# Patient Record
Sex: Female | Born: 1935 | Race: White | Hispanic: No | Smoking: Former smoker
Health system: Southern US, Community
[De-identification: ages and names within clinical notes are randomized; demographics above are authoritative.]

## PROBLEM LIST (undated history)

## (undated) DIAGNOSIS — M1990 Unspecified osteoarthritis, unspecified site: Secondary | ICD-10-CM

## (undated) DIAGNOSIS — I2510 Atherosclerotic heart disease of native coronary artery without angina pectoris: Secondary | ICD-10-CM

## (undated) DIAGNOSIS — G4700 Insomnia, unspecified: Secondary | ICD-10-CM

## (undated) DIAGNOSIS — R413 Other amnesia: Secondary | ICD-10-CM

## (undated) DIAGNOSIS — I10 Essential (primary) hypertension: Secondary | ICD-10-CM

## (undated) DIAGNOSIS — N183 Chronic kidney disease, stage 3 unspecified: Secondary | ICD-10-CM

## (undated) DIAGNOSIS — K2100 Gastro-esophageal reflux disease with esophagitis, without bleeding: Secondary | ICD-10-CM

## (undated) DIAGNOSIS — J329 Chronic sinusitis, unspecified: Secondary | ICD-10-CM

## (undated) DIAGNOSIS — F329 Major depressive disorder, single episode, unspecified: Secondary | ICD-10-CM

## (undated) DIAGNOSIS — F32A Depression, unspecified: Secondary | ICD-10-CM

## (undated) DIAGNOSIS — Z853 Personal history of malignant neoplasm of breast: Secondary | ICD-10-CM

## (undated) DIAGNOSIS — I350 Nonrheumatic aortic (valve) stenosis: Secondary | ICD-10-CM

## (undated) DIAGNOSIS — E039 Hypothyroidism, unspecified: Secondary | ICD-10-CM

## (undated) DIAGNOSIS — E785 Hyperlipidemia, unspecified: Secondary | ICD-10-CM

## (undated) HISTORY — DX: Gastro-esophageal reflux disease with esophagitis, without bleeding: K21.00

## (undated) HISTORY — PX: REPLACEMENT TOTAL KNEE BILATERAL: SUR1225

## (undated) HISTORY — DX: Unspecified osteoarthritis, unspecified site: M19.90

## (undated) HISTORY — DX: Insomnia, unspecified: G47.00

## (undated) HISTORY — DX: Atherosclerotic heart disease of native coronary artery without angina pectoris: I25.10

## (undated) HISTORY — DX: Chronic sinusitis, unspecified: J32.9

## (undated) HISTORY — DX: Hyperlipidemia, unspecified: E78.5

## (undated) HISTORY — PX: BREAST LUMPECTOMY: SHX2

## (undated) HISTORY — DX: Gastro-esophageal reflux disease with esophagitis: K21.0

## (undated) HISTORY — DX: Depression, unspecified: F32.A

## (undated) HISTORY — PX: GALLBLADDER SURGERY: SHX652

## (undated) HISTORY — DX: Chronic kidney disease, stage 3 (moderate): N18.3

## (undated) HISTORY — DX: Chronic kidney disease, stage 3 unspecified: N18.30

## (undated) HISTORY — PX: FOOT TENDON SURGERY: SHX958

## (undated) HISTORY — PX: TOTAL ABDOMINAL HYSTERECTOMY: SHX209

## (undated) HISTORY — DX: Hypothyroidism, unspecified: E03.9

## (undated) HISTORY — DX: Major depressive disorder, single episode, unspecified: F32.9

## (undated) HISTORY — PX: ANGIOPLASTY: SHX39

## (undated) HISTORY — DX: Other amnesia: R41.3

---

## 1997-08-13 ENCOUNTER — Ambulatory Visit (HOSPITAL_COMMUNITY): Admission: RE | Admit: 1997-08-13 | Discharge: 1997-08-13 | Payer: Self-pay

## 1999-02-03 ENCOUNTER — Ambulatory Visit (HOSPITAL_COMMUNITY): Admission: RE | Admit: 1999-02-03 | Discharge: 1999-02-03 | Payer: Self-pay | Admitting: Unknown Physician Specialty

## 1999-02-03 ENCOUNTER — Encounter: Payer: Self-pay | Admitting: Unknown Physician Specialty

## 1999-02-09 ENCOUNTER — Encounter: Payer: Self-pay | Admitting: Unknown Physician Specialty

## 1999-02-09 ENCOUNTER — Ambulatory Visit (HOSPITAL_COMMUNITY): Admission: RE | Admit: 1999-02-09 | Discharge: 1999-02-09 | Payer: Self-pay | Admitting: Unknown Physician Specialty

## 2000-05-16 ENCOUNTER — Ambulatory Visit (HOSPITAL_COMMUNITY): Admission: RE | Admit: 2000-05-16 | Discharge: 2000-05-16 | Payer: Self-pay | Admitting: Internal Medicine

## 2000-05-16 ENCOUNTER — Encounter: Payer: Self-pay | Admitting: Internal Medicine

## 2001-09-08 ENCOUNTER — Ambulatory Visit (HOSPITAL_COMMUNITY): Admission: RE | Admit: 2001-09-08 | Discharge: 2001-09-08 | Payer: Self-pay | Admitting: Internal Medicine

## 2001-09-08 ENCOUNTER — Encounter: Payer: Self-pay | Admitting: Internal Medicine

## 2002-05-09 ENCOUNTER — Other Ambulatory Visit: Admission: RE | Admit: 2002-05-09 | Discharge: 2002-05-09 | Payer: Self-pay | Admitting: Obstetrics and Gynecology

## 2004-12-03 ENCOUNTER — Inpatient Hospital Stay (HOSPITAL_COMMUNITY): Admission: RE | Admit: 2004-12-03 | Discharge: 2004-12-07 | Payer: Self-pay | Admitting: Orthopedic Surgery

## 2005-01-20 ENCOUNTER — Ambulatory Visit (HOSPITAL_COMMUNITY): Admission: RE | Admit: 2005-01-20 | Discharge: 2005-01-20 | Payer: Self-pay | Admitting: Internal Medicine

## 2005-05-06 ENCOUNTER — Inpatient Hospital Stay (HOSPITAL_COMMUNITY): Admission: RE | Admit: 2005-05-06 | Discharge: 2005-05-10 | Payer: Self-pay | Admitting: Orthopedic Surgery

## 2005-11-08 ENCOUNTER — Ambulatory Visit: Payer: Self-pay | Admitting: Internal Medicine

## 2005-11-30 ENCOUNTER — Ambulatory Visit: Payer: Self-pay | Admitting: Internal Medicine

## 2005-12-29 ENCOUNTER — Ambulatory Visit: Admission: RE | Admit: 2005-12-29 | Discharge: 2005-12-29 | Payer: Self-pay | Admitting: Internal Medicine

## 2005-12-30 ENCOUNTER — Ambulatory Visit: Payer: Self-pay | Admitting: Internal Medicine

## 2007-06-06 ENCOUNTER — Ambulatory Visit (HOSPITAL_COMMUNITY): Admission: RE | Admit: 2007-06-06 | Discharge: 2007-06-06 | Payer: Self-pay | Admitting: Internal Medicine

## 2007-06-15 ENCOUNTER — Encounter: Admission: RE | Admit: 2007-06-15 | Discharge: 2007-06-15 | Payer: Self-pay | Admitting: Internal Medicine

## 2007-06-20 ENCOUNTER — Encounter (INDEPENDENT_AMBULATORY_CARE_PROVIDER_SITE_OTHER): Payer: Self-pay | Admitting: Diagnostic Radiology

## 2007-06-20 ENCOUNTER — Encounter: Admission: RE | Admit: 2007-06-20 | Discharge: 2007-06-20 | Payer: Self-pay | Admitting: Internal Medicine

## 2007-06-23 DIAGNOSIS — D0510 Intraductal carcinoma in situ of unspecified breast: Secondary | ICD-10-CM

## 2007-06-23 HISTORY — DX: Intraductal carcinoma in situ of unspecified breast: D05.10

## 2007-06-29 ENCOUNTER — Encounter: Admission: RE | Admit: 2007-06-29 | Discharge: 2007-06-29 | Payer: Self-pay | Admitting: Internal Medicine

## 2007-07-28 ENCOUNTER — Encounter: Admission: RE | Admit: 2007-07-28 | Discharge: 2007-07-28 | Payer: Self-pay | Admitting: Surgery

## 2007-08-01 ENCOUNTER — Encounter: Admission: RE | Admit: 2007-08-01 | Discharge: 2007-08-01 | Payer: Self-pay | Admitting: Surgery

## 2007-08-01 ENCOUNTER — Ambulatory Visit (HOSPITAL_BASED_OUTPATIENT_CLINIC_OR_DEPARTMENT_OTHER): Admission: RE | Admit: 2007-08-01 | Discharge: 2007-08-01 | Payer: Self-pay | Admitting: Surgery

## 2007-08-01 ENCOUNTER — Encounter (INDEPENDENT_AMBULATORY_CARE_PROVIDER_SITE_OTHER): Payer: Self-pay | Admitting: Surgery

## 2008-03-24 DIAGNOSIS — C50911 Malignant neoplasm of unspecified site of right female breast: Secondary | ICD-10-CM

## 2008-03-24 HISTORY — DX: Malignant neoplasm of unspecified site of right female breast: C50.911

## 2008-05-30 ENCOUNTER — Ambulatory Visit (HOSPITAL_COMMUNITY): Admission: RE | Admit: 2008-05-30 | Discharge: 2008-05-30 | Payer: Self-pay | Admitting: Endocrinology

## 2008-07-01 ENCOUNTER — Encounter: Admission: RE | Admit: 2008-07-01 | Discharge: 2008-07-01 | Payer: Self-pay | Admitting: Surgery

## 2008-07-03 ENCOUNTER — Encounter: Admission: RE | Admit: 2008-07-03 | Discharge: 2008-07-03 | Payer: Self-pay | Admitting: Surgery

## 2008-07-03 ENCOUNTER — Encounter (INDEPENDENT_AMBULATORY_CARE_PROVIDER_SITE_OTHER): Payer: Self-pay | Admitting: Radiology

## 2008-07-10 ENCOUNTER — Encounter: Admission: RE | Admit: 2008-07-10 | Discharge: 2008-07-10 | Payer: Self-pay | Admitting: Surgery

## 2008-07-25 ENCOUNTER — Encounter: Admission: RE | Admit: 2008-07-25 | Discharge: 2008-07-25 | Payer: Self-pay | Admitting: Surgery

## 2008-08-23 ENCOUNTER — Encounter: Admission: RE | Admit: 2008-08-23 | Discharge: 2008-08-23 | Payer: Self-pay | Admitting: Surgery

## 2008-08-27 ENCOUNTER — Ambulatory Visit (HOSPITAL_BASED_OUTPATIENT_CLINIC_OR_DEPARTMENT_OTHER): Admission: RE | Admit: 2008-08-27 | Discharge: 2008-08-27 | Payer: Self-pay | Admitting: Surgery

## 2008-08-27 ENCOUNTER — Encounter: Admission: RE | Admit: 2008-08-27 | Discharge: 2008-08-27 | Payer: Self-pay | Admitting: Surgery

## 2008-08-27 ENCOUNTER — Encounter (INDEPENDENT_AMBULATORY_CARE_PROVIDER_SITE_OTHER): Payer: Self-pay | Admitting: Surgery

## 2009-01-09 ENCOUNTER — Inpatient Hospital Stay (HOSPITAL_BASED_OUTPATIENT_CLINIC_OR_DEPARTMENT_OTHER): Admission: RE | Admit: 2009-01-09 | Discharge: 2009-01-09 | Payer: Self-pay | Admitting: Interventional Cardiology

## 2009-04-03 ENCOUNTER — Ambulatory Visit (HOSPITAL_COMMUNITY): Admission: RE | Admit: 2009-04-03 | Discharge: 2009-04-04 | Payer: Self-pay | Admitting: Interventional Cardiology

## 2010-04-04 ENCOUNTER — Encounter: Payer: Self-pay | Admitting: Internal Medicine

## 2010-04-05 ENCOUNTER — Encounter: Payer: Self-pay | Admitting: Endocrinology

## 2010-05-31 LAB — BASIC METABOLIC PANEL
BUN: 23 mg/dL (ref 6–23)
Chloride: 103 mEq/L (ref 96–112)

## 2010-05-31 LAB — CBC
HCT: 30.3 % — ABNORMAL LOW (ref 36.0–46.0)
Hemoglobin: 10.4 g/dL — ABNORMAL LOW (ref 12.0–15.0)
MCV: 91.7 fL (ref 78.0–100.0)
Platelets: 174 10*3/uL (ref 150–400)
RDW: 13.2 % (ref 11.5–15.5)

## 2010-06-18 LAB — POCT I-STAT 3, ART BLOOD GAS (G3+)
Acid-Base Excess: 1 mmol/L (ref 0.0–2.0)
Bicarbonate: 25.8 mEq/L — ABNORMAL HIGH (ref 20.0–24.0)
pO2, Arterial: 83 mmHg (ref 80.0–100.0)

## 2010-06-18 LAB — POCT I-STAT 3, VENOUS BLOOD GAS (G3P V)
Bicarbonate: 27.5 mEq/L — ABNORMAL HIGH (ref 20.0–24.0)
TCO2: 29 mmol/L (ref 0–100)
pCO2, Ven: 46.3 mmHg (ref 45.0–50.0)
pH, Ven: 7.382 — ABNORMAL HIGH (ref 7.250–7.300)
pO2, Ven: 37 mmHg (ref 30.0–45.0)

## 2010-06-22 LAB — COMPREHENSIVE METABOLIC PANEL
ALT: 18 U/L (ref 0–35)
BUN: 27 mg/dL — ABNORMAL HIGH (ref 6–23)
Calcium: 9.4 mg/dL (ref 8.4–10.5)
Glucose, Bld: 125 mg/dL — ABNORMAL HIGH (ref 70–99)
Sodium: 138 mEq/L (ref 135–145)
Total Protein: 7 g/dL (ref 6.0–8.3)

## 2010-06-22 LAB — DIFFERENTIAL
Lymphocytes Relative: 20 % (ref 12–46)
Lymphs Abs: 1.3 10*3/uL (ref 0.7–4.0)
Monocytes Relative: 7 % (ref 3–12)
Neutro Abs: 4.7 10*3/uL (ref 1.7–7.7)
Neutrophils Relative %: 69 % (ref 43–77)

## 2010-06-22 LAB — CBC
Hemoglobin: 11.9 g/dL — ABNORMAL LOW (ref 12.0–15.0)
MCHC: 33.9 g/dL (ref 30.0–36.0)
RDW: 13.4 % (ref 11.5–15.5)

## 2010-07-28 NOTE — Op Note (Signed)
NAMEEMELYN, ROEN               ACCOUNT NO.:  [id number]   MEDICAL RECORD NO.:  [id number]          PATIENT TYPE:  AMB   LOCATION:  DSC                          FACILITY:  [facility]   PHYSICIAN:  Currie Paris, M.D.DATE OF BIRTH:  1926/01/02   DATE OF PROCEDURE:  08/27/2008  DATE OF DISCHARGE:                               OPERATIVE REPORT   PREOPERATIVE DIAGNOSIS:  Carcinoma, right breast, upper outer quadrant,  clinical stage I.   POSTOPERATIVE DIAGNOSIS:  Carcinoma, right breast, upper outer quadrant,  clinical stage I.   OPERATION:  Right needle-guided lumpectomy.   SURGEON:  Currie Paris, MD   ANESTHESIA:  General.   CLINICAL HISTORY:  This is an 77 year old lady who has had a prior left  lumpectomy for breast cancer who was now found to have an invasive right  breast cancer that appears to be in the upper outer quadrant.  It was  nonpalpable.  She elected to have a lumpectomy.  She wished no  additional therapy including no radiation nor chemo.   DESCRIPTION OF PROCEDURE:  The patient was seen in the holding area and  she had no further questions.  We confirmed and initialed the right  breast as the operative side.  I reviewed the nodal localizing films.  The guidewire entered lateral on the breast and tracked medially in  about the 9:30 or 10 o'clock position.   The patient was taken to the operating room and after satisfactory  general anesthesia had been obtained, the breast was prepped and draped.  Time-out was done.   I made a transverse incision starting at the guidewire and going from  lateral to medial over what I proceed to be the guidewire tract.  I then  raised a skin flap superiorly for several centimeters, inferiorly for  several centimeters, and medially for several centimeters.  The  guidewire was manipulated into the tract and the tissue around the tract  grasped with an Allis and pulled up into the wound.  Using cutting and  coagulation  current of the cautery, I tried to take a wide excisional  biopsy around the guidewire, trying to keep where I thought the  guidewire was in the center of the excision and going to where I was  medial to the guidewire tip, where I divided the breast tissue and took  the specimen off.  I could not really palpate a definite mass within  their, although there was a couple of areas were thickened.  I inked it  and then the specimen mammogram showing the clip in the specimen.   Because she was having no further therapy, I elected to take additional  margin.  I had not quite gotten all the way down to the fascia, so I  took additional deep margin and the deep half of the superior, medial,  inferior, lateral margins just by shaving this tissue off in all  directions.  This was sent as a separate specimen.   I made sure everything was dry using sutures and cautery.  I irrigated.  I closed in layers with 3-0 Vicryl,  4-0 Monocryl subcuticular, and  Dermabond on the skin.   The patient tolerated the procedure well and there were no  complications.  All counts were correct.      Currie Paris, M.D.  Electronically Signed     CJS/MEDQ  D:  08/27/2008  T:  08/28/2008  Job:  [id number]   cc:   Simone Curia

## 2010-07-28 NOTE — Op Note (Signed)
[name], [name]               ACCOUNT NO.:  [id number]   MEDICAL RECORD NO.:  [id number]          PATIENT TYPE:  AMB   LOCATION:  DSC                          FACILITY:  [facility]   PHYSICIAN:  Currie Paris, M.D.DATE OF BIRTH:  [birth year]   DATE OF PROCEDURE:  08/01/2007  DATE OF DISCHARGE:                               OPERATIVE REPORT   PREOPERATIVE DIAGNOSIS:  Carcinoma, left breast upper inner quadrant,  clinical stage 0.   POSTOPERATIVE DIAGNOSIS:  Carcinoma, left breast upper inner quadrant,  clinical stage 0.   OPERATION:  Needle-guided left partial mastectomy.   SURGEON:  Currie Paris, MD   ANESTHESIA:  General.   CLINICAL HISTORY:  [name] is an 77 year old lady recently found to  have an abnormality on mammogram and a biopsy showed receptor-positive  DCIS.  MRI showed no other areas of abnormality.  She had gotten a  significant hematoma from the biopsy, so we deferred surgery for a few  weeks to let the hematoma resolved and she was brought to the operating  room today for needle-guided wide local excision of her left breast  cancer.   DESCRIPTION OF PROCEDURE:  The patient was seen in the holding area and  she had no further questions.  We both identified and initialed the left  breast as the operative side.  Prior to my seeing her, she had had a  guidewire placed and I reviewed those films.  The guidewire appeared to  track from superior to inferior and the clip placed at the biopsy site  was approximately 3 cm inferior to the guidewire entry site.   The patient was taken to the operating room.  After satisfactory general  anesthesia had been obtained, the breast was prepped and draped.  The  time-out was done.   I began by making a curvilinear incision above the areola about 2.5  centimeters below the guidewire entry site.  I elevated a thin skin flap  up towards the guidewire site and manipulated the guidewire into the  wound.  I then used  the cautery to divide the breast tissue down towards  the chest wall and then worked both medially and laterally and then  inferiorly or deep to free up all of this tissue along the tract of the  guidewire.  I finally divided the breast tissue inferiorly which was at  the areolar margin and we did a specimen mammogram.  That seemed to show  the clip fairly close to the superior margin which was a little  surprising since I had thought it was a little further away from the  guidewire entry site.  Nevertheless with a finding of the clip close to  the superior margin, I went back and took additional superior margin as  well as a little bit deep margin so that I was down to chest wall deep.  I oriented both specimens for pathology.  I did note some residual  hematoma in the area, which I thought we had excised along with the  specimen.   I put 0.25% plain Marcaine in to help with  postop pain relief.  Once  everything was dry, I closed in layers with 3-0 Vicryl and 4-0 Monocryl  subcuticular with Dermabond on the skin.   The patient tolerated the procedure well and there were no  complications.  All counts were correct.      Currie Paris, M.D.  Electronically Signed     CJS/MEDQ  D:  08/01/2007  T:  08/02/2007  Job:  [id number]   cc:   Simone Curia

## 2010-07-31 NOTE — Assessment & Plan Note (Signed)
[facility]                               PULMONARY OFFICE NOTE   NAME:[name], [name] BECKLEY                      MRN:          [id number]  DATE:11/30/2005                            DOB:          05-04-1925    PROBLEM:  Complaint of post nasal drainage.   HISTORY:  She continues to be bothered by a sensation of wet throat and post  nasal drainage. She thought she was better for the first week after taking  Singulair plus Nasacort and Depo-Medrol 80 mg IM. She then had gum surgery  and says her wet throat developed again after that. She is completely  unaware of any heartburn and she does not blow much out of her nose. Cannot  tell that Nasacort has been helping although she continues it.   MEDICATIONS:  Diclofenac 75 mg, Amlodipine, gabapentin 600 mg,  Levothyroxine, HT CZ, Nasacort AQ, Simvastatin 20 mg, Zelnorm 2 mg,  Prilosec, Singulair 10 mg.   ALLERGIES:  LATEX   OBJECTIVE:  VITAL SIGNS: Weight 220 pounds, BP 162/90, Pulse regular 77,  room air saturation 96%.  HEENT: Pharynx is a little reddened especially over the tonsil crypts, but  there is not adenomatous prominence and I see no exudate or visible  drainage. Voice quality is not hoarse, there is no strider, no obvious nasal  congestion today.  Lung fields are quiet.   IMPRESSION:  This still would seem to be most likely a combination of  laryngopharyngeal reflux and perhaps allergic rhinitis.   PLAN:  1. Try Reglan a.c. and h.s. for 10 days with side effects discussed.  2. Schedule modified barium swallow with speech therapy assistance.  3. Schedule return one month, earlier PRN.                                   [name], MD, [facility], FACP   CDY/MedQ  DD:  11/30/2005  DT:  12/02/2005  Job #:  [id number]   cc:   [name]  [name], M.D.  [name]

## 2010-07-31 NOTE — Assessment & Plan Note (Signed)
[facility]                               PULMONARY OFFICE NOTE   NAME:[name], [name] BORDENAVE                      MRN:          [id number]  DATE:11/08/2005                            DOB:          [birth year]    PROBLEM:  This 77 year old woman was referred through the courtesy of Dr.  Nedra Hai in consultation because of complaints of postnasal drainage.   HISTORY:  She is a former smoker who says for four or five years she has had  an annoying postnasal drip.  This persisted throughout two different homes.  She had ENT evaluation and then has worked with Dr. Lucie Leather for allergy.  He  suspected reflux, and they tried Aciphex.  She quit all medications this  summer, saying nothing helped and says since that time she has been neither  better nor worse.  As she wakes, she feels a postnasal drainage sensation,  and she says the drip makes her snort.  She is then clear in her nose and  throat most of the rest of the day.  Her daughter expresses doubt that  reflux would explain this.  They tried Zyrtec which was some help but  overdried.  There has been no pattern of season or exposure, and she has no  prior history of seasonal allergic rhinitis.  Nonspecific irritants bother  her some, mainly strong smells.   MEDICATIONS:  1. Diclofenac 75 mg.  2. Amlodipine.  3. Gabapentin 600 mg.  4. Levothyroxine 25 mcg.  5. HCTZ.  6. Nasacort AQ.  7. Simvastatin 20 mg.  8. Aciphex 20 mg.  9. Zelnorm 2 mg.  10.Prilosec.   She is unclear about the duplication.   ALLERGIES:  DRUG INTOLERANCE TO LATEX.   REVIEW OF SYSTEMS:  She denies dyspnea, chest pain, sinus pain, fever,  purulent or bloody discharge, earache, adenopathy, or rash.  Has some joint  stiffness.   PAST MEDICAL HISTORY:  1. A CT of the sinuses indicated some chronic sinusitis, but she says      treatment did not help.  2. Medical treatment for hypertension and elevated cholesterol.  3. Cancer.  4. She  says she tested mildly sensitive for latex but has not recognized      it as an exposure in her ordinary life.  No problems with contrast dye      or aspirin.   PAST SURGICAL HISTORY:  1. Cholecystectomy.  2. Hysterectomy.   SOCIAL HISTORY:  She quit smoking in 1969.  Rare alcohol.  She is widowed  with grown children, living alone in a retirement home.  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   FAMILY HISTORY:  Father with heart disease.  Nobody recognized as having  similar complaints or allergy history in the immediate family to her  recollection.   Her endometrial cancer was treated in 1993.   OBJECTIVE:  VITAL SIGNS:  Weight 214 pounds, blood pressure 168/84, pulse  regular at 83, room air saturation 93%.  SKIN:  No rash.  ADENOPATHY:  None found.  HEENT:  Pharynx red, particularly at the tonsil beds  but without glandular  prominence, visible mucus, or thrush.  Voice quality is normal.  There was  no stridor or thyromegaly.  Her nasal airway was unobstructed.  Conjunctivae  were clear.  The ear canals were clear.  CHEST:  Quiet, clear lung fields.  Unlabored breathing.  Heart sounds  regular without murmur or gallop.  ABDOMEN:  Obese, nontender.  EXTREMITIES:  No edema, cyanosis, or clubbing.   ENVIRONMENTAL:  We did determine that she sleeps on a feather pillow but  with encasing.   IMPRESSION:  1. Complaint of postnasal drainage that she associates with rhinitis.  All      allergic rhinitis would not be likely at this age.  We have not much      evidence that there is actually pharyngeal mucus flow, except the      suggestion from her previous CT of the sinuses.  2. I agree that reflux remains a prime suspicion, even though this has not      been made convincing to her yet.   PLAN:  1. Try Singulair 10 mg daily.  2. Try Nasacort AQ two sprays each nostril daily.  3. Depo-Medrol 80 mg IM.  4. Schedule return in two weeks.  I am going to look to see what might be      left that  other      physicians have not tried.  I also explained to her that we may be able      only to exclude medically significant problems, leaving her to put up      with what we cannot fix.                                   [name], MD, [facility], FACP   CDY/MedQ  DD:  11/19/2005  DT:  11/19/2005  Job #:  [id number]   cc:   Darrelyn Hillock  Jessica Priest, M.D.  Simone Curia

## 2010-07-31 NOTE — Discharge Summary (Signed)
NAMEFRANCESKA, STRAHM               ACCOUNT NO.:  [id number]   MEDICAL RECORD NO.:  [id number]          PATIENT TYPE:  INP   LOCATION:  1510                         FACILITY:  [facility]   PHYSICIAN:  French Ana A. Shuford, P.A.-C.DATE OF BIRTH:  1925/12/16   DATE OF ADMISSION:  12/03/2004  DATE OF DISCHARGE:                                 DISCHARGE SUMMARY   ADDENDUM:   DISCHARGE DIAGNOSES:  Postoperative hypokalemia, resolved.   DISCHARGE INSTRUCTIONS:  She did have some mild hypokalemia that was treated  with __________ and resolved. No further are medications are needed.      Tracy A. Shuford, P.A.-C.     TAS/MEDQ  D:  12/06/2004  T:  12/06/2004  Job:  [id number]

## 2010-07-31 NOTE — Op Note (Signed)
[name], [name]               ACCOUNT NO.:  [id number]   MEDICAL RECORD NO.:  [id number]          PATIENT TYPE:  INP   LOCATION:  0003                         FACILITY:  [facility]   PHYSICIAN:  Vania Rea. Supple, M.D.  DATE OF BIRTH:  10-22-1925   DATE OF PROCEDURE:  12/03/2004  DATE OF DISCHARGE:                                 OPERATIVE REPORT   PREOPERATIVE DIAGNOSIS:  End-stage right knee osteoarthrosis.   POSTOPERATIVE DIAGNOSIS:  End-stage right knee osteoarthrosis.   PROCEDURE:  A cemented left total knee arthroplasty utilizing a DePuy Sigma  implant, size 2.5 femur, 2.5 tibia, a 35 mm patellar button, and a 12.5  rotating platform polyethylene insert.   SURGEON:  Vania Rea. Supple, M.D.   Threasa HeadsFrench Ana A. Shuford, P.A.-C.   ANESTHESIA:  Spinal.   TOURNIQUET TIME:  One hour and 9 minutes.   ESTIMATED BLOOD LOSS:  200 cc.   DRAINS:  Hemovac x1.   HISTORY:  [name] is a 77 year old female who has had chronic left knee  pain with progressive increasing functional limitations with radiographs  confirming advanced arthrosis with bone on bone contact laterally.  Due to  her ongoing pain and functional limitations, she is brought to the operating  room at this time for planned left knee arthroplasty, as described below.   We discussed with [name] and her family members treatment options as  well as risks versus benefits thereof.  Possible surgical complications,  including bleeding, infection, neurovascular injury, DVT, PE, persistence of  pain, loss of motion, possible need for revisional surgery and significant  complications, possible need for additional surgery.  She understands,  accepts, and agrees with the planned procedure.   PROCEDURE IN DETAIL:  After undergoing routine preoperative evaluation, the  patient received prophylactic antibiotics.  In the operating room, she had a  spinal anesthetic placed. She was moved to the supine position.  A Foley  catheter placed.  A tourniquet was applied to the left thigh, and the left  leg was sterilely prepped and draped in a standard fashion.  An anterior  midline incision was then made.  The leg was exsanguinated with the  tourniquet inflated to 350 mmHg.  In the anterior, a midline incision was  then made from four fingerbreadths above the patella to just medial to the  tibial tubercle.  Total length of approximately 20 cm.  Skin flaps were  immobilized.  Electrocautery was used for hemostasis.  A medial parapatellar  arthrotomy was then performed with electrocautery.  The patella was everted,  and approximately a half fat pad was excised.  Extensive synovectomy was  performed.  Remnants of the menisci as well as cruciate ligaments were  removed.  The knee was then flexed up with the patella everted, and the  drill was used to gain access to the intramedullary canal of the distal  femur.  The intramedullary guide was then passed, utilizing a 5 degree  valgus slope.  We removed 11 mm from the distal femur with an oscillating  saw.  The distal femur was then sized, and the  size 2.5 had the best  coverage.  The 2.5 cutting guide was then placed.  We made the anterior,  posterior, and chamfer cuts on the distal femur.  Friable implant placement  showed good fit.  The knee was then flexed up.  Proximal tibial was exposed.  Using an extramedullary guide, a 10 mm cut was made transversely across the  tibia with an approximately 5 degree posterior slope, care taken to protect  the surrounding soft tissues.  The proximal tibia was then measured, and a  2.5 had the best coverage.  A 2.5 stem was then reamed and broached after a  trial reduction confirmed good soft tissue balance and symmetric flexion and  extension gaps.  We then cut the distal femoral box cut using the 0.5 box  cutting guide.  Reduction at this point showed the implants all had  excellent fit, excellent soft tissue balance, and good  mobility and  stability.  The patella was then exposed, and peripheral synovial tissue  excised.  Using the off-loading saw, approximately 8.5 mm of bone was  removed, allowing excellent coverage by the 35 mm patellar button.  The  stabilizing drill holes were then drilled.  At this point, pulsatile lavage  was then used to meticulously debride the joint.  We did confirm proper soft  tissue balance.  Posterior compartments were inspected and probed.  No  residual debris or osteophytes were noted.  At this point, the joint was  then copiously cleaned and dried.  The femoral canal was probed with a bone  plug.  Cement was mixed, and at the appropriate consistency, the implants  were cemented into position, beginning with the tibia, then the femur, and  then the patella.  Meticulous removal of all extra cement was completed.  Reduction with 10 mm and 2.5 mm implants was then performed, and the 2.5 had  the best soft tissue balance.  The final 12.5 mm rotating platform implant  was then opened, placed in position.  With final reduction performed, the  knee was taken through a range of motion, showing good stability.  At this  point, the tourniquet was let down.  A Hemovac drain was brought out  laterally.  Hemostasis was obtained.  The medial parapatellar arthrotomy was  closed with a series of interrupted figure-of-eight #1 Vicryl sutures, 2-0  Vicryl were used for the deep and superficial subcu, and intracuticular 3-0  Monocryl for the skin followed by Steri-Strips.  A bulky dry dressing  wrapped about the knee followed by an Ace bandage from foot to thigh, knee  immobilizer, ice packs.  The patient was then transferred to the recovery  room in stable condition.      Vania Rea. Supple, M.D.  Electronically Signed     KMS/MEDQ  D:  12/03/2004  T:  12/03/2004  Job:  [id number]

## 2010-07-31 NOTE — Discharge Summary (Signed)
[name], [name]               ACCOUNT NO.:  [id number]   MEDICAL RECORD NO.:  [id number]          PATIENT TYPE:  INP   LOCATION:  5008                         FACILITY:  [facility]   PHYSICIAN:  French Ana A. [name], P.A.-C.DATE OF BIRTH:  Dec 16, 1925   DATE OF ADMISSION:  05/06/2005  DATE OF DISCHARGE:  05/10/2005                                 DISCHARGE SUMMARY   ADDITIONAL DIAGNOSES:  1.  Osteoarthrosis of the right knee.  2.  Hypertension.  3.  Hypothyroidism.  4.  History of trigeminal neuralgia.   DISCHARGE DIAGNOSES:  1.  Osteoarthrosis of the right knee.  2.  Hypertension.  3.  Hypothyroidism.  4.  History of trigeminal neuralgia.  5.  Right total knee arthroplasty.  6.  Postoperative hemorrhagic anemia requiring transfusion.   OPERATION:  Right total knee arthroplasty.   SURGEON:  Vania Rea. Supple, M.D.   ASSESSMENT:  [name] Lora. [name], P.A.-C.   ANESTHESIA:  Spinal.   ALLERGIES:  LATEX AND BETADINE.   BRIEF HISTORY:  [name] is a very pleasant 77 year old female well-known  to [place], being admitted for right total knee arthroplasty.  She is status post  left total knee arthroplasty and has done extremely well with that.  She has  had progressive pain formerly in the right knee.  X-rays were shown bone-on-  bone changes.  Risks and benefits were discussed for total knee arthroplasty  at length, and she wished to proceed.   HOSPITAL COURSE:  Mrs.  [name] was admitted and underwent the above  procedure and tolerated this well.  Ibuprofen, IV antibiotics, analgesics  were utilized.  Postoperatively, she was placed on Coumadin for DVT and  prophylaxis.  She was placed weightbearing as tolerated and total knee  replacement protocol.  CPN was instituted, and she progressed on  postoperative course.  Foley was discontinued by day #2, and she began  working with physical therapy.  However, she did experience a hemorrhagic  anemia, and was symptomatic.  Hemoglobin dropped  to 7.2.  She was transfused  two units.  On May 10, 2005, her hemoglobin was back up to 9.2, and she  felt much better.  She was afebrile.  Vital signs were stable.  Incision was  clean and dry.  She was neurovascularly intact.  Calves were soft and  tender.  At this time, she is ready for discharge to home with Home Health  PT, RN with [place].  She had met all of her therapy goals.   LABORATORY DATA:  Admission hemoglobin 12 down to 7.2 postoperatively and  after two units transfused 9.2.  Chemistries within normal limits on  admission.  Postoperatively, she did have mild hyponatremia which resolved  by May 09, 2005, to 135.  She had a mild hypokalemia as well at 2.7,  and this was improving at 3.2.  Two units transfused found transfused and is  on the chart.  EKG showed normal sinus rhythm.  Chest x-ray not found at the  time of this patient.   CONDITION ON DISCHARGE:  Stable and improved.   DISCHARGE MEDICATIONS:  As  planned.   DISPOSITION:  The patient has been discharged to home with Home Health PT  and RN.  She is weightbearing as tolerated to right lower extremity.  Prescription for Percocet, Robaxin, Coumadin and Lunesta were provided.  Follow up in two weeks in the office.  Call for a time.  May shower at this  time.  Resume home medications and home diet.      [name], P.A.-C.     TAS/MEDQ  D:  06/23/2005  T:  06/24/2005  Job:  [id number]

## 2010-07-31 NOTE — Discharge Summary (Signed)
[name], [name]               ACCOUNT NO.:  [id number]   MEDICAL RECORD NO.:  [id number]          PATIENT TYPE:  INP   LOCATION:  1510                         FACILITY:  [facility]   PHYSICIAN:  Vania Rea. Supple, M.D.  DATE OF BIRTH:  1925/10/10   DATE OF ADMISSION:  12/03/2004  DATE OF DISCHARGE:  12/07/2004                                 DISCHARGE SUMMARY   ADMISSION DIAGNOSES:  1.  End-stage osteoarthrosis of the left knee.  2.  Hypertension.  3.  Hypercholesterolemia.  4.  Recent diagnosis trigeminal neuralgia.  5.  Hypothyroidism.  6.  Latex allergy.   DISCHARGE DIAGNOSES:  1.  End-stage osteoarthrosis of the left knee.  2.  Hypertension.  3.  Hypercholesterolemia.  4.  Recent diagnosis trigeminal neuralgia.  5.  Hypothyroidism.  6.  Latex allergy.  7.  Status post left total knee arthroplasty.   OPERATION:  Left total knee arthroplasty under spinal anesthetic. Surgeon  Dr. Francena Hanly, assistant Ralene Bathe, under general anesthetic.   CONSULTATIONS:  None.   BRIEF HISTORY:  [name] is a very pleasant well known 77 year old female  to our practice we have followed for ongoing difficulties in her left knee.  She has failed outpatient conservative measures including knee arthroscopy  as well as injections and has documented end-stage osteoarthrosis by  radiographs. She is having retractable pain and difficulties with normal  daily living at this point, we have discussed total knee arthroplasty. The  risks and benefits were discussed at length and she wished to proceed.   HOSPITAL COURSE:  The patient was admitted and underwent the above mentioned  procedure and tolerated this well. All appropriate IV antibiotics and  analgesics were utilized. Postoperatively she was placed on DVT and PE  prophylaxis on Coumadin and allowed weightbearing as tolerated and total  knee replacement protocol. She did well postoperatively. She had mild  hemorrhagic anemia but did not  require transfusions. She stabilized to 8.7.  Her incision was clean and dry. She worked well with therapy and progressed  nicely towards the level where she was stable for discharge to skilled care  which had been prior arranged at the [facility] nursing facility where she was a  resident. At the time of discharge, she was afebrile, vital signs were  stable and she was ready for discharge on the following morning.   LABORATORY DATA:  Admission hemoglobin is 13, postoperative 9.7, 8.9, 8.7.  Chemistries on admission, borderline elevated glucose between 125 and 138  which is nonfasting. Urinalysis with trace leukocyte esterase.  Postoperatively, chemistries within normal limits. Blood type O positive. X-  rays showed chronic changes in her chest but no active disease. Left knee  films showed osteoarthrosis.   CONDITION ON DISCHARGE:  Stable and improved.   DISCHARGE MEDS AND PLANS:  The patient being discharged on her regular diet.  She is on the following medications:   1.  Gabapentin 600 mg t.i.d. p.o.  2.  Sandostatin 20 mg q.a.m. daily.  3.  Hold Diclofenac while on Coumadin.  4.  Percocet 1-2 every 4-6 p.r.n. pain.  5.  Lunesta 2 mg q.h.s. p.r.n.  6.  Norvasc 5 mg q.a.m.  7.  Levothyroxine 25 mg daily.  8.  Nasacort 2 sprays each nostril daily.  9.  Prilosec 20 mg daily.  10. Mucinex 600 mg daily.   She is to continue formal physical therapy, aggressive range of motion,  strengthening and weightbearing as tolerated with total knee replacement  protocol. She may shower at this time. Leave Steri-Strips on skin until they  fall off. Followup in our office, please arrange appointment and time for 2  weeks from now. Call [phone].      Tracy A. Shuford, P.A.-C.      Vania Rea. Supple, M.D.  Electronically Signed    TAS/MEDQ  D:  12/06/2004  T:  12/06/2004  Job:  [id number]

## 2010-07-31 NOTE — Assessment & Plan Note (Signed)
[name]                               PULMONARY OFFICE NOTE   NAME:[name], [name] HOUSEWRIGHT                      MRN:          [id number]  DATE:12/30/2005                            DOB:          [birth year]    PROBLEM:  Complaint of postnasal drainage.   HISTORY:  Modified barium swallow showed little or no aspiration to her  understanding.  The formal report is on its way and needs confirmation.  She  is not aware of choking while she eats or symptoms suggesting penetration or  reflux.  She was in [place] where it was extremely dry and this made her  worse.  She has not been using Nasacort but says she has been doing a great  deal better since she has been using Reglan 5 mg a.c. and h.s., which we  started here on September 18.  She has had pneumococcal vaccine and plans  flu shot.   MEDICATION:  Diclofenac, amlodipine, gabapentin 600 mg, levothyroxine,  hydrochlorothiazide, Nasacort AQ, simvastatin, Zelnorm, Prilosec, Singulair,  Reglan 5 mg a.c. and h.s.   DRUG INTOLERANT:  Only latex.   OBJECTIVE:  Weight 224 pounds, BP 138/76, pulse regular at 98, room air  saturation 94%.  Nose and throat look clear with no pharyngeal erythema or  obvious post nasal drainage.  Lungs are clear to P&A.  Heart sounds are  regular without murmur or gallop.   IMPRESSION:  1. Cough, apparently responsive to Reglan which implies at least      intermittent laryngopharyngeal reflux, which may not have been      confirmed by the modified barium swallow, report pending.  2. We still presume at least low-grade allergic rhinitis.  She is      symptomatically much improved and satisfied.   PLAN:  Continue Reglan.  Schedule one more return in 4 months, then probably  p.r.n. unless something changes.       [name], MD, FCCP, FACP     CDY/MedQ  DD:  01/01/2006  DT:  01/03/2006  Job #:  [id number]   cc:   Simone Curia

## 2010-07-31 NOTE — Op Note (Signed)
[name], [name]               ACCOUNT NO.:  [id number]   MEDICAL RECORD NO.:  [id number]          PATIENT TYPE:  INP   LOCATION:  5008                         FACILITY:  [facility]   PHYSICIAN:  Vania Rea. Supple, M.D.  DATE OF BIRTH:  1925/12/21   DATE OF PROCEDURE:  05/06/2005  DATE OF DISCHARGE:                                 OPERATIVE REPORT   PREOP DIAGNOSIS:  End stage right knee osteoarthritis.   POSTOP DIAGNOSIS:  End stage right knee osteoarthritis.   PROCEDURE:  Cemented right total knee arthroplasty, utilizing a DePuy Sigma  2.5 femur. A #3 tibia, a 12.5 mm thick rotating platform polyethylene insert  and a 35 mm patellar button.   SURGEON:  Francena Hanly.   ASSISTANT:  Ralene Bathe  PA-C.   ANESTHESIA:  Spinal.   TOURNIQUET TIME:  1 hour 25 minutes.   BLOOD LOSS:  Minimal.   DRAINS:  Hemovac x 1.   HISTORY:  [name] is a 77 year old female who has had chronic right knee  pain with known end-stage osteoarthritis. She has undergone a previous left  total knee arthroplasty with excellent clinical result and this now brought  to the operating room for planned right total knee arthroplasty.   Carefully reviewed with [name] and family members treatment options as  well as risks versus benefits thereof. Possible surgical complications of  bleeding, infection, neurovascular injury, DVT, PE, persistent pain, loss of  motion and possible need for revision surgery were all reviewed. She  understands, accepts and agrees for planned procedure.   PROCEDURE IN DETAIL:  After undergoing routine preop evaluation, the patient  received prophylactic antibiotics. Brought to the operating room and on the  hospital bed and a spinal anesthetic placed by the anesthesia department.  Placed supine and Foley catheter placed. Tourniquet applied to right thigh.  The right leg was then sterilely prepped and draped in standard fashion. Leg  was exsanguinated with a tourniquet  inflated to 400 mmHg.  An anterior  midline incision was then made from four fingerbreadths above the patella to  just medial to the tibial tubercle total length approximately 20  centimeters. Skin flaps were circumferentially elevated. Electrocautery was  used hemostasis. Medial parapatellar arthrotomy was performed with patella  everted and the majority of the fat pad excised. Remnants the cruciate  ligaments as well as menisci were removed. The knee was then flexed up and a  drill was used to gain access to the femoral intramedullary canal followed  by an intramedullary guide set at 5degree valgus cut removing 11 mm from the  distal femur.  This was then cut with an oscillating saw. The distal femur  was then sized and the 2.5 had the best coverage. The 2.5 cutting block was  then applied and proper position and the anterior, posterior and chamfer  cuts were all then made. The femur was then sized and 2.5 implant had  excellent coverage and excellent fit. Our attention then directed to the  proximal tibia where using the extramedullary guide the transverse cut was  then made removing 10 mm of  bone from the lateral plateau with care taken to  protect surrounding soft tissues. The proximal tibia was then measured and  showed excellent coverage with #3 tibial tray. The tibial tray was then  positioned and then a trial was performed and there was excellent soft  tissue balance and symmetric flexion and extension gaps. Once proper tibial  position was determined we then did drilled and broached for the tibial  keel. All implants were then removed.  Our attention was redirected to the  femur where the box cutting guide was applied. An oscillating saw was then  used to create the distal femoral box cut. Attention then directed to the  patella were utilizing an oscillating saw transverse cut was made removing  the articular surface and removing approximately 8.5 mm of bone. The  stabilizing  drill holes were then made for the 35 mm patellar button. At  this point the posterior compartments were then carefully inspected. No  obvious residual bony debris, osteophytes or soft tissue remained. The knee  joint was then copiously irrigated with pulsatile lavage. The joint was  meticulously cleaned and dried. Cement was then mixed in the appropriate  consistency, the implants were cemented into position beginning with the  tibia, then the femur and then the patella. All residual extra cement was  meticulously removed. A trial reduction was performed with a 10 and then the  12.5 implant and 12.5 had the best soft tissue balance and full knee motion,  good stability throughout a range of motion. The final 12.5 rotating  platform implant was then opened, the tibial tray was meticulously cleaned.  The implant was then reduced and the knee again taken through range of  motion showing excellent stability. At this point a Hemovac drain was  brought out laterally. The parapatellar arthrotomy was closed with a series  of #1 Vicryl figure-of-eight sutures. 2-0 Vicryls was used to close the  subcu and intracuticular 3-0 Monocryl for the skin. Steri-Strips applied.  Tourniquet was let down. Bulky dry dressings were then applied to the right  lower extremity and the patient was then transferred to the recovery room in  stable condition.      Vania Rea. Supple, M.D.  Electronically Signed     KMS/MEDQ  D:  05/06/2005  T:  05/07/2005  Job:  [id number]

## 2010-12-09 LAB — COMPREHENSIVE METABOLIC PANEL
ALT: 25
CO2: 28
Calcium: 9.6
Creatinine, Ser: 1.09
GFR calc non Af Amer: 48 — ABNORMAL LOW
Glucose, Bld: 133 — ABNORMAL HIGH
Total Bilirubin: 0.8

## 2010-12-09 LAB — CBC
Hemoglobin: 12.8
MCHC: 34.7
MCV: 91.2
RBC: 4.06

## 2010-12-09 LAB — DIFFERENTIAL
Basophils Absolute: 0
Eosinophils Absolute: 0.2
Lymphocytes Relative: 19
Lymphs Abs: 1.3
Neutrophils Relative %: 72

## 2011-08-24 ENCOUNTER — Encounter: Payer: Self-pay | Admitting: Internal Medicine

## 2011-08-25 ENCOUNTER — Encounter: Payer: Self-pay | Admitting: Internal Medicine

## 2011-08-25 ENCOUNTER — Ambulatory Visit (INDEPENDENT_AMBULATORY_CARE_PROVIDER_SITE_OTHER): Payer: Medicare Other | Admitting: Internal Medicine

## 2011-08-25 VITALS — BP 118/78 | HR 74 | Temp 97.6°F | Ht 60.0 in | Wt 205.6 lb

## 2011-08-25 DIAGNOSIS — R0600 Dyspnea, unspecified: Secondary | ICD-10-CM | POA: Insufficient documentation

## 2011-08-25 DIAGNOSIS — R05 Cough: Secondary | ICD-10-CM

## 2011-08-25 DIAGNOSIS — R051 Acute cough: Secondary | ICD-10-CM

## 2011-08-25 DIAGNOSIS — R0609 Other forms of dyspnea: Secondary | ICD-10-CM

## 2011-08-25 HISTORY — DX: Acute cough: R05.1

## 2011-08-25 NOTE — Patient Instructions (Addendum)
#  Chronic cough  - likely due to sinus drainage. Currently better  - I respect your desire to just watch it instead of more tests and doctors - you could try netti pot saline wash a simple natural treatment for this on a daily basis; this is likely to help  - if it does not help, talk to your primary doctor and get referral for ENT or Allergy evaluation - Also, we need to ensure that the pneumonia from Nov 2012; for this you need repeat CXR  - your primary care PA has told me she will order one if she cannot find the followup CXR   - if this pneumonia has not cleared up then you need CT scan of chest  #Shorness of breath  -  Likely due to heart issues but fitness or lung issues or weight issues could be contributing  - Respect your desire to just watch this without tests  #Followup  - return as needed

## 2011-08-25 NOTE — Assessment & Plan Note (Signed)
Likely due to post nasal drainage but we need to ensure that the pneumonia has cleared. She is adamant that her pneumonia has cleared on CXR. She is refusing any cxr here. I called her PMD PA and she confirmed an cxr followup was done but she could not locate it. I have asked PMD PA to ensure a cxr is done and to ensure is clear. IF not clear, CT chest needed. Patient will not follow up here because she is not interested in followup with specialty clinic or advanced tests. I have offered simple netti pot saline wash with bottled water for post nasal drip. She is in agreement with plan

## 2011-08-25 NOTE — Assessment & Plan Note (Signed)
Multifactorial from obesity to cardiac murmur nos to CAD to unspecified possibly associaed lung disease but she is not interested in workup.

## 2011-08-25 NOTE — Progress Notes (Signed)
Subjective:    Patient ID: [name], female    DOB: 1925-05-07, 78 y.o.   MRN: [id number]  HPI  78 year old female with hx of dementia on aricept but ablel to give decent history. Body mass index is 40.15 kg/(m^2).  reports that she quit smoking about 47 years ago. Her smoking use included Cigarettes. She has a 10 pack-year smoking history. She does not have any smokeless tobacco history on file. PCP is Feliciana Rossetti, MD   Refrred for chronic coug per PMD notes. Gives history of recurrent acute sinusitis 4 episodes since nov 2012 with baseline severe post nasal drip at all times. Each acute sinusitis is associatd with cough that lingers and ultimately resolves. Each acute episode treated with antibiotics. With episode in Nov 2012 developed RLL pneumonia (I personally confirmed this via access to [facility] cxr's) but unclear if this was followed up and resolved. Patient and her PA who I spoke to say  a followup CXR was done and showed clearance but PA could not locate this film (neither could I on [facility] access). Most recent cough and sinusitis episodes was in April 2013 after a trip to [facility]. Currently cough is very mild with RSI cough score of 12 (See below), and she is very surprised she is seeing a pulmnarhy doctor for this. She is refusing all workup and appears to have capacity. She does have constant yellow sinus drainage that is mildly yellow in color, rates it as moderate severity and present for months-years. Reportedly has had ent and allergy evaluation in the past nos and she does not recollect details.    In terms of dyspnea. Is of insidious onset. CHronic for years.Aggravated by exertion for acitivities like doing too much or climbing 1 flight of stairs and improved with rest. It is progressive over time. There is associaed obesity. She believes all of dyspnea is due to cardiac issues because it did improve after a stent placement. Moderate in severity. She is absolutely not  interestd in pulmonary workup for this. Her justification she is 1 and this is to be expected and no point doing too many tests.     Dr Gretta Cool Reflux Symptom Index (> 13-15 suggestive of LPR cough) 0 -> 5  =  none ->severe problem  Hoarseness of problem with voice 2  Clearing  Of Throat 2  Excess throat mucus or feeling of post nasal drip 3  Difficulty swallowing food, liquid or tablets 0  Cough after eating or lying down 0  Breathing difficulties or choking episodes 3  Troublesome or annoying cough 2  Sensation of something sticking in throat or lump in throat 0  Heartburn, chest pain, indigestion, or stomach acid coming up 0  TOTAL 12     CXR Nov 2012  - PNA right base suspected compared to august 2012     Past Medical History  Diagnosis Date  . Acute sinusitis   . Anxiety   . Aphthous ulcer   . Breast cancer   . Chronic kidney disease, stage III (moderate)   . Reflux esophagitis     chronic  . Chronic sinusitis   . CAD (coronary artery disease)   . Depressive disorder   . Pain in joint, site unspecified   . Abnormality of gait   . Edema   . HA (headache)   . Hyperlipemia   . Hypothyroid   . Insomnia   . Memory loss   . Osteoarthritis   . PNA (pneumonia)  Family History  Problem Relation Age of Onset  . Heart disease Father   . Heart disease Maternal Grandfather   . Breast cancer Sister      History   Social History  . Marital Status: Widowed    Spouse Name: N/A    Number of Children: N/A  . Years of Education: N/A   Occupational History  . retired    Social History Main Topics  . Smoking status: Former Smoker -- 0.5 packs/day for 20 years    Types: Cigarettes    Quit date: 03/15/1964  . Smokeless tobacco: Not on file  . Alcohol Use: No     social  . Drug Use: No  . Sexually Active: Not on file   Other Topics Concern  . Not on file   Social History Narrative  . No narrative on file     Allergies  Allergen Reactions  .  Betadine (Povidone Iodine)     blisters  . Latex     hives  . Zestril (Lisinopril)     cough     Outpatient Prescriptions Prior to Visit  Medication Sig Dispense Refill  . buPROPion (WELLBUTRIN XL) 150 MG 24 hr tablet Take 150 mg by mouth daily.      . diclofenac (VOLTAREN) 50 MG EC tablet Take 50 mg by mouth 2 (two) times daily.      Marland Kitchen donepezil (ARICEPT) 5 MG tablet Take 5 mg by mouth at bedtime.      . DULoxetine (CYMBALTA) 30 MG capsule Take 30 mg by mouth daily.      Marland Kitchen levothyroxine (SYNTHROID, LEVOTHROID) 50 MCG tablet Take 50 mcg by mouth daily.      . metoprolol succinate (TOPROL-XL) 25 MG 24 hr tablet Take 25 mg by mouth daily.      . Multiple Vitamins-Minerals (MULTIVITAMIN PO) Take 1 tablet by mouth daily.      Marland Kitchen olmesartan-hydrochlorothiazide (BENICAR HCT) 40-12.5 MG per tablet Take 1 tablet by mouth daily.      . pantoprazole (PROTONIX) 40 MG tablet Take 40 mg by mouth daily.      . simvastatin (ZOCOR) 40 MG tablet Take 40 mg by mouth daily.      . traMADol (ULTRAM) 50 MG tablet Take 50 mg by mouth. Every 12 hours as needed.      . traZODone (DESYREL) 50 MG tablet Take by mouth. Take 1-2 tablets at bedtime.      . ALPRAZolam (XANAX) 0.5 MG tablet Take by mouth. Take 1/2-1 tablet every 8 hours as needed.      Marland Kitchen HYDROcodone-acetaminophen (NORCO) 5-325 MG per tablet Take 1 tablet by mouth every 6 (six) hours as needed.      Marland Kitchen olmesartan (BENICAR) 40 MG tablet Take 40 mg by mouth daily.           Review of Systems  Constitutional: Negative for fever and unexpected weight change.  HENT: Positive for congestion. Negative for ear pain, nosebleeds, sore throat, rhinorrhea, sneezing, trouble swallowing, dental problem, postnasal drip and sinus pressure.   Eyes: Negative for redness and itching.  Respiratory: Positive for cough and shortness of breath. Negative for chest tightness and wheezing.   Cardiovascular: Positive for leg swelling. Negative for palpitations.    Gastrointestinal: Negative for nausea and vomiting.  Genitourinary: Negative for dysuria.  Musculoskeletal: Negative for joint swelling.  Skin: Negative for rash.  Neurological: Negative for headaches.  Hematological: Does not bruise/bleed easily.  Psychiatric/Behavioral: Positive for dysphoric mood. The patient  is not nervous/anxious.        Objective:   Physical Exam  Vitals reviewed. Constitutional: She is oriented to person, place, and time. She appears well-developed and well-nourished. No distress.       Obese Body mass index is 40.15 kg/(m^2).   HENT:  Head: Normocephalic and atraumatic.  Right Ear: External ear normal.  Left Ear: External ear normal.  Mouth/Throat: Oropharynx is clear and moist. No oropharyngeal exudate.  Eyes: Conjunctivae and EOM are normal. Pupils are equal, round, and reactive to light. Right eye exhibits no discharge. Left eye exhibits no discharge. No scleral icterus.  Neck: Normal range of motion. Neck supple. No JVD present. No tracheal deviation present. No thyromegaly present.       Obvious post nasal drip in uvular +  Cardiovascular: Normal rate, regular rhythm, normal heart sounds and intact distal pulses.  Exam reveals no gallop and no friction rub.   No murmur heard. Pulmonary/Chest: Effort normal and breath sounds normal. No respiratory distress. She has no wheezes. She has no rales. She exhibits no tenderness.  Abdominal: Soft. Bowel sounds are normal. She exhibits no distension and no mass. There is no tenderness. There is no rebound and no guarding.  Musculoskeletal: Normal range of motion. She exhibits no edema and no tenderness.  Lymphadenopathy:    She has no cervical adenopathy.  Neurological: She is alert and oriented to person, place, and time. She has normal reflexes. No cranial nerve deficit. She exhibits normal muscle tone. Coordination normal.  Skin: Skin is warm and dry. No rash noted. She is not diaphoretic. No erythema. No  pallor.  Psychiatric: Judgment and thought content normal.       Mildly anxious          Assessment & Plan:

## 2012-01-17 ENCOUNTER — Encounter (INDEPENDENT_AMBULATORY_CARE_PROVIDER_SITE_OTHER): Payer: Self-pay

## 2012-02-09 ENCOUNTER — Encounter (INDEPENDENT_AMBULATORY_CARE_PROVIDER_SITE_OTHER): Payer: Self-pay | Admitting: Surgery

## 2012-02-09 ENCOUNTER — Ambulatory Visit (INDEPENDENT_AMBULATORY_CARE_PROVIDER_SITE_OTHER): Payer: Medicare Other | Admitting: Surgery

## 2012-02-09 VITALS — BP 140/84 | HR 72 | Temp 97.1°F | Resp 18 | Ht 60.0 in | Wt 213.0 lb

## 2012-02-09 DIAGNOSIS — D449 Neoplasm of uncertain behavior of unspecified endocrine gland: Secondary | ICD-10-CM

## 2012-02-09 DIAGNOSIS — E042 Nontoxic multinodular goiter: Secondary | ICD-10-CM

## 2012-02-09 DIAGNOSIS — D440 Neoplasm of uncertain behavior of thyroid gland: Secondary | ICD-10-CM

## 2012-02-09 HISTORY — DX: Nontoxic multinodular goiter: E04.2

## 2012-02-09 NOTE — Patient Instructions (Signed)
Thyroid Biopsy The thyroid gland is a butterfly-shaped gland situated in the front of the neck. It produces hormones which affect metabolism, growth and development, and body temperature. A thyroid biopsy is a procedure in which small samples of tissue or fluid are removed from the thyroid gland or mass and examined under a microscope. This test is done to determine the cause of thyroid problems, such as infection, cancer, or other thyroid problems. There are 2 ways to obtain samples: 1. Fine needle biopsy. Samples are removed using a thin needle inserted through the skin and into the thyroid gland or mass. 2. Open biopsy. Samples are removed after a cut (incision) is made through the skin. LET YOUR CAREGIVER KNOW ABOUT:   Allergies.  Medications taken including herbs, eye drops, over-the-counter medications, and creams.  Use of steroids (by mouth or creams).  Previous problems with anesthetics or numbing medicine.  Possibility of pregnancy, if this applies.  History of blood clots (thrombophlebitis).  History of bleeding or blood problems.  Previous surgery.  Other health problems. RISKS AND COMPLICATIONS  Bleeding from the site. The risk of bleeding is higher if you have a bleeding disorder or are taking any blood thinning medications (anticoagulants).  Infection.  Injury to structures near the thyroid gland. BEFORE THE PROCEDURE  This is a procedure that can be done as an outpatient. Confirm the time that you need to arrive for your procedure. Confirm whether there is a need to fast or withhold any medications. A blood sample may be done to determine your blood clotting time. Medicine may be given to help you relax (sedative). PROCEDURE Fine needle biopsy. You will be awake during the procedure. You may be asked to lie on your back with your head tipped backward to extend your neck. Let your caregiver know if you cannot tolerate the positioning. An area on your neck will be  cleansed. A needle is inserted through the skin of your neck. You may feel a mild discomfort during this procedure. You may be asked to avoid coughing, talking, swallowing, or making sounds during some portions of the procedure. The needle is withdrawn once tissue or fluid samples have been removed. Pressure may be applied to the neck to reduce swelling and ensure that bleeding has stopped. The samples will be sent for examination.  Open biopsy. You will be given general anesthesia. You will be asleep during the procedure. An incision is made in your neck. A sample of thyroid tissue or the mass is removed. The tissue sample or mass will be sent for examination. The sample or mass may be examined during the biopsy. If the sample or mass contains cancer cells, some or all of the thyroid gland may be removed. The incision is closed with stitches. AFTER THE PROCEDURE  Your recovery will be assessed and monitored. If there are no problems, as an outpatient, you should be able to go home shortly after the procedure. If you had a fine needle biopsy:  You may have soreness at the biopsy site for 1 to 2 days. If you had an open biopsy:   You may have soreness at the biopsy site for 3 to 4 days.  You may have a hoarse voice or sore throat for 1 to 2 days. Obtaining the Test Results It is your responsibility to obtain your test results. Do not assume everything is normal if you have not heard from your caregiver or the medical facility. It is important for you to follow up   on all of your test results. HOME CARE INSTRUCTIONS   Keeping your head raised on a pillow when you are lying down may ease biopsy site discomfort.  Supporting the back of your head and neck with both hands as you sit up from a lying position may ease biopsy site discomfort.  Only take over-the-counter or prescription medicines for pain, discomfort, or fever as directed by your caregiver.  Throat lozenges or gargling with warm salt  water may help to soothe a sore throat. SEEK IMMEDIATE MEDICAL CARE IF:   You have severe bleeding from the biopsy site.  You have difficulty swallowing.  You have a fever.  You have increased pain, swelling, redness, or warmth at the biopsy site.  You notice pus coming from the biopsy site.  You have swollen glands (lymph nodes) in your neck. Document Released: 12/27/2006 Document Revised: 05/24/2011 Document Reviewed: 05/29/2008 Exit[employer] Patient Information 2013 [employer].  

## 2012-02-09 NOTE — Progress Notes (Signed)
General Surgery [facility]  Chief Complaint  Patient presents with  . New Evaluation    eval thyroid nodules - referral from Dr. Feliciana Rossetti    HISTORY: Patient is an 78 year old white female who presents today accompanied by her daughter who is a Garment/textile technologist. Patient underwent recent workup including chest x-ray and CT scan of the chest. An incidental finding of thyroid nodule was noted. Patient subsequently underwent a thyroid ultrasound which demonstrated a normal sized thyroid gland. However there were two dominant nodules in the right thyroid lobe measuring 2.0 cm and 2.3 cm respectively.  Left thyroid lobe was heterogeneous but without dominant or discrete nodules. Patient has previously been evaluated by an endocrinologist for hypothyroidism. She takes levothyroxine 50 mcg daily.  Patient has had no prior head or neck surgery. There is a family history of thyroid disease with goiter in the patient's mother and sister. There is no history of other endocrinopathy.  Past Medical History  Diagnosis Date  . Acute sinusitis   . Anxiety   . Aphthous ulcer   . Breast cancer   . Chronic kidney disease, stage III (moderate)   . Reflux esophagitis     chronic  . Chronic sinusitis   . CAD (coronary artery disease)   . Depressive disorder   . Pain in joint, site unspecified   . Abnormality of gait   . Edema   . HA (headache)   . Hyperlipemia   . Hypothyroid   . Insomnia   . Memory loss   . Osteoarthritis   . PNA (pneumonia)      Current Outpatient Prescriptions  Medication Sig Dispense Refill  . ALPRAZolam (XANAX) 0.5 MG tablet Take by mouth. Take 1/2-1 tablet every 8 hours as needed.      . diclofenac (VOLTAREN) 50 MG EC tablet Take 50 mg by mouth 2 (two) times daily.      . DULoxetine (CYMBALTA) 30 MG capsule Take 30 mg by mouth daily.      Marland Kitchen HYDROcodone-acetaminophen (NORCO) 5-325 MG per tablet Take 1 tablet by mouth every 6 (six) hours as needed.       Marland Kitchen levothyroxine (SYNTHROID, LEVOTHROID) 50 MCG tablet Take 50 mcg by mouth daily.      . metoprolol succinate (TOPROL-XL) 25 MG 24 hr tablet Take 25 mg by mouth daily.      . Multiple Vitamins-Minerals (MULTIVITAMIN PO) Take 1 tablet by mouth daily.      Marland Kitchen olmesartan-hydrochlorothiazide (BENICAR HCT) 40-12.5 MG per tablet Take 1 tablet by mouth daily.      . simvastatin (ZOCOR) 40 MG tablet Take 40 mg by mouth daily.      Marland Kitchen buPROPion (WELLBUTRIN XL) 150 MG 24 hr tablet Take 150 mg by mouth daily.      Marland Kitchen donepezil (ARICEPT) 5 MG tablet Take 5 mg by mouth at bedtime.      . pantoprazole (PROTONIX) 40 MG tablet Take 40 mg by mouth daily.      . traMADol (ULTRAM) 50 MG tablet Take 50 mg by mouth. Every 12 hours as needed.      . traZODone (DESYREL) 50 MG tablet Take by mouth. Take 1-2 tablets at bedtime.      Marland Kitchen zolpidem (AMBIEN) 5 MG tablet Take 1 tablet by mouth as needed.         Allergies  Allergen Reactions  . Betadine (Povidone Iodine)     blisters  . Latex Swelling    hives  .  Zestril (Lisinopril)     cough     Family History  Problem Relation Age of Onset  . Heart disease Father   . Heart disease Maternal Grandfather   . Breast cancer Sister      History   Social History  . Marital Status: Widowed    Spouse Name: N/A    Number of Children: N/A  . Years of Education: N/A   Occupational History  . retired    Social History Main Topics  . Smoking status: Former Smoker -- 0.5 packs/day for 20 years    Types: Cigarettes    Quit date: 03/15/1964  . Smokeless tobacco: None  . Alcohol Use: No     Comment: social  . Drug Use: No  . Sexually Active: None   Other Topics Concern  . None   Social History Narrative  . None     REVIEW OF SYSTEMS - PERTINENT POSITIVES ONLY: Denies tremor. Denies palpitations. Denies mass or pain. Denies compressive symptoms.  EXAM: Filed Vitals:   02/09/12 1130  BP: 140/84  Pulse: 72  Temp: 97.1 F (36.2 C)  Resp: 18     HEENT: normocephalic; pupils equal and reactive; sclerae clear; dentition good; mucous membranes moist NECK:  Palpable 2 cm nodules mid right thyroid lobe, mobile with swallowing, nontender; symmetric on extension; no palpable anterior or posterior cervical lymphadenopathy; no supraclavicular masses; no tenderness CHEST: clear to auscultation bilaterally without rales, rhonchi, or wheezes CARDIAC: regular rate and rhythm without significant murmur; peripheral pulses are full EXT:  non-tender with mild edema at ankles; no deformity NEURO: no gross focal deficits; no sign of tremor   LABORATORY RESULTS: See [facility] (CHL-Epic) for most recent results   RADIOLOGY RESULTS: See [facility] (CHL-Epic) for most recent results   IMPRESSION: Right thyroid nodules, 2.0 cm and 2.3 cm  PLAN: I discussed the above findings at length with the patient and her daughter. We reviewed her studies. I have recommended that she proceed with ultrasound-guided fine-needle aspiration biopsy of the 2 dominant nodules in the right thyroid lobe. We will make arrangements for this study in the near future. I will contact them with the cytopathology results. Certainly if there is any atypia or sign of malignancy, we will ask her to return to the office for further evaluation and for scheduling of surgical resection. If the cytopathology appears benign, then I believe she can have a close interval followup in 6 months with repeat ultrasound and physical examination. We will make arrangements for these studies.  Velora Heckler, MD, FACS [facility]   Visit Diagnoses: 1. Neoplasm of uncertain behavior of thyroid gland, right lobe     Primary Care Physician: Feliciana Rossetti, MD

## 2012-02-10 LAB — TSH: TSH: 1.95 u[IU]/mL (ref 0.450–4.500)

## 2012-02-18 ENCOUNTER — Telehealth (INDEPENDENT_AMBULATORY_CARE_PROVIDER_SITE_OTHER): Payer: Self-pay

## 2012-02-18 NOTE — Telephone Encounter (Signed)
Pt notified of tsh results. Pt to keep appt for bx 12-10.

## 2012-02-22 ENCOUNTER — Other Ambulatory Visit (HOSPITAL_COMMUNITY)
Admission: RE | Admit: 2012-02-22 | Discharge: 2012-02-22 | Disposition: A | Discharge status: Home / Self Care | Payer: Medicare Other | Source: Ambulatory Visit | Attending: Interventional Radiology | Admitting: Interventional Radiology

## 2012-02-22 ENCOUNTER — Ambulatory Visit
Admission: RE | Admit: 2012-02-22 | Discharge: 2012-02-22 | Disposition: A | Discharge status: Home / Self Care | Payer: Medicare Other | Source: Ambulatory Visit | Attending: Surgery | Admitting: Surgery

## 2012-02-22 DIAGNOSIS — D440 Neoplasm of uncertain behavior of thyroid gland: Secondary | ICD-10-CM

## 2012-02-22 DIAGNOSIS — E049 Nontoxic goiter, unspecified: Secondary | ICD-10-CM | POA: Insufficient documentation

## 2012-02-24 ENCOUNTER — Telehealth (INDEPENDENT_AMBULATORY_CARE_PROVIDER_SITE_OTHER): Payer: Self-pay

## 2012-02-24 ENCOUNTER — Other Ambulatory Visit (INDEPENDENT_AMBULATORY_CARE_PROVIDER_SITE_OTHER): Payer: Self-pay

## 2012-02-24 DIAGNOSIS — E042 Nontoxic multinodular goiter: Secondary | ICD-10-CM

## 2012-02-24 NOTE — Progress Notes (Signed)
Quick Note:  Please contact patient and notify of benign pathology results.  Dreshaun Stene M. Dashonna Chagnon, MD, FACS [facility] Office: [phone]   ______ 

## 2012-02-24 NOTE — Telephone Encounter (Signed)
Pt and her daughter Aram Beecham notified of path result and repeat u/s June 11,2014. Pts daughter concerned because pt still c/o tiredness and sob that has been going on for several months. I advised her to have pt worked up with her pcp or heart md to r/o underlying cause. She states she will.

## 2012-02-24 NOTE — Progress Notes (Signed)
Quick Note:  Please contact patient and notify of benign pathology results.  Christina Waldrop M. Hattie Aguinaldo, MD, FACS [facility] Office: [phone]   ______ 

## 2012-08-23 ENCOUNTER — Other Ambulatory Visit: Payer: Medicare Other

## 2012-09-05 ENCOUNTER — Telehealth (INDEPENDENT_AMBULATORY_CARE_PROVIDER_SITE_OTHER): Payer: Self-pay

## 2012-09-05 NOTE — Telephone Encounter (Signed)
Pt n/s appt for thyroid u/s. Pt was oot. Pts daughter will call [facility] to r/s u/s and call back for appt with Dr Gerrit Friends.

## 2012-09-13 ENCOUNTER — Ambulatory Visit
Admission: RE | Admit: 2012-09-13 | Discharge: 2012-09-13 | Disposition: A | Discharge status: Home / Self Care | Payer: Medicare Other | Source: Ambulatory Visit | Attending: Surgery | Admitting: Surgery

## 2012-09-13 DIAGNOSIS — E042 Nontoxic multinodular goiter: Secondary | ICD-10-CM

## 2012-09-21 ENCOUNTER — Telehealth (INDEPENDENT_AMBULATORY_CARE_PROVIDER_SITE_OTHER): Payer: Self-pay

## 2012-09-21 NOTE — Telephone Encounter (Signed)
Appt made for 10-06-12 with Pts daughter to discuss u/s and for exam.

## 2012-10-06 ENCOUNTER — Ambulatory Visit (INDEPENDENT_AMBULATORY_CARE_PROVIDER_SITE_OTHER): Payer: Medicare Other | Admitting: Surgery

## 2012-10-06 ENCOUNTER — Encounter (INDEPENDENT_AMBULATORY_CARE_PROVIDER_SITE_OTHER): Payer: Self-pay | Admitting: Surgery

## 2012-10-06 VITALS — BP 134/82 | HR 80 | Resp 16 | Ht 60.0 in | Wt 206.8 lb

## 2012-10-06 DIAGNOSIS — D440 Neoplasm of uncertain behavior of thyroid gland: Secondary | ICD-10-CM

## 2012-10-06 DIAGNOSIS — D449 Neoplasm of uncertain behavior of unspecified endocrine gland: Secondary | ICD-10-CM

## 2012-10-06 NOTE — Progress Notes (Signed)
General Surgery [facility]  Visit Diagnoses: 1. Neoplasm of uncertain behavior of thyroid gland, right lobe     HISTORY: Patient is an 79 year old female who returns for follow-up of multiple thyroid nodules. Patient was evaluated in 2013. She was found to have 2 dominant nodules in the right thyroid lobe. She underwent ultrasound-guided fine needle aspiration biopsy with benign cytopathology. She currently takes Synthroid 50 mcg daily.  At my request she underwent a follow-up thyroid ultrasound on 09/13/2012. This showed a normal sized thyroid gland. Dominant nodules in the right thyroid lobe actually decreased significantly in size. Remaining small nodules were stable. No new nodules were identified.  PERTINENT REVIEW OF SYSTEMS: Patient notes hoarseness in the morning associated with sinus drainage. She has also had some hot flashes and sweats. She denies tremors. She denies palpitations.  EXAM: HEENT: normocephalic; pupils equal and reactive; sclerae clear; dentition good; mucous membranes moist NECK:  Palpable nodules mid right thyroid lobe, less than 2 cm in size, mobile, nontender; symmetric on extension; no palpable anterior or posterior cervical lymphadenopathy; no supraclavicular masses; no tenderness CHEST: clear to auscultation bilaterally without rales, rhonchi, or wheezes CARDIAC: regular rate and rhythm without significant murmur; peripheral pulses are full EXT:  non-tender without edema; no deformity NEURO: no gross focal deficits; no sign of tremor   IMPRESSION: Multiple thyroid nodules, clinically stable  PLAN: I discussed these findings with the patient and her family. I have recommended that she return in one year for physical examination. At this point heard nodules are stable and there is no evidence of malignancy.  Velora Heckler, MD, [facility] Office: [phone]

## 2012-10-06 NOTE — Patient Instructions (Signed)

## 2013-01-23 ENCOUNTER — Other Ambulatory Visit: Payer: Self-pay | Admitting: Internal Medicine

## 2013-01-23 DIAGNOSIS — R928 Other abnormal and inconclusive findings on diagnostic imaging of breast: Secondary | ICD-10-CM

## 2013-02-15 ENCOUNTER — Telehealth: Payer: Self-pay | Admitting: Interventional Cardiology

## 2013-02-15 NOTE — Telephone Encounter (Signed)
New Message  Pt daughter called--- States the pt recently had a stent put in// Pt is wheezing, has no stamina and SOB when walking// requests a call back for a sooner appt than 12/10// please assist.

## 2013-02-15 NOTE — Telephone Encounter (Signed)
Attempted to call patient back but unable to leave message on either home phone (no mailbox set up) or mobile phone (mailbox full).    Called back and spoke with patient's daughter. Patient experiencing worsening SOB, dyspnea, fatigue and wheezing. Patient asked to be seen sooner than 12/10 appointment. Scheduled with Dr. Katrinka Blazing for 12/5 at 10:30am. Advised that should patient have worsening symptoms or experience decompensation from poor oxygenation, she should proceed to the Emergency Dept for emergent treatment. Daughter of patient verbalized understanding and agreement.

## 2013-02-16 ENCOUNTER — Ambulatory Visit (INDEPENDENT_AMBULATORY_CARE_PROVIDER_SITE_OTHER): Payer: Medicare Other | Admitting: Interventional Cardiology

## 2013-02-16 ENCOUNTER — Encounter: Payer: Self-pay | Admitting: Interventional Cardiology

## 2013-02-16 VITALS — BP 142/74 | HR 72 | Ht 60.0 in | Wt 205.0 lb

## 2013-02-16 DIAGNOSIS — I350 Nonrheumatic aortic (valve) stenosis: Secondary | ICD-10-CM

## 2013-02-16 DIAGNOSIS — R0989 Other specified symptoms and signs involving the circulatory and respiratory systems: Secondary | ICD-10-CM

## 2013-02-16 DIAGNOSIS — E785 Hyperlipidemia, unspecified: Secondary | ICD-10-CM

## 2013-02-16 DIAGNOSIS — R0600 Dyspnea, unspecified: Secondary | ICD-10-CM

## 2013-02-16 DIAGNOSIS — I2510 Atherosclerotic heart disease of native coronary artery without angina pectoris: Secondary | ICD-10-CM

## 2013-02-16 DIAGNOSIS — R0609 Other forms of dyspnea: Secondary | ICD-10-CM

## 2013-02-16 DIAGNOSIS — I10 Essential (primary) hypertension: Secondary | ICD-10-CM | POA: Insufficient documentation

## 2013-02-16 DIAGNOSIS — I359 Nonrheumatic aortic valve disorder, unspecified: Secondary | ICD-10-CM

## 2013-02-16 HISTORY — DX: Nonrheumatic aortic (valve) stenosis: I35.0

## 2013-02-16 HISTORY — DX: Essential (primary) hypertension: I10

## 2013-02-16 HISTORY — DX: Hyperlipidemia, unspecified: E78.5

## 2013-02-16 LAB — BRAIN NATRIURETIC PEPTIDE: Pro B Natriuretic peptide (BNP): 110 pg/mL — ABNORMAL HIGH (ref 0.0–100.0)

## 2013-02-16 NOTE — Progress Notes (Signed)
Patient ID: [name], female   DOB: 1925/08/30, 79 y.o.   MRN: [id number]    [address], [address] [place], [place]  [address] Phone: [phone] Fax:  [phone]  Date:  02/16/2013   ID:  [name], DOB [birth year], MRN [id number]  PCP:  [name] Rossetti, MD   ASSESSMENT:  1. Aortic stenosis, at least moderate and possibly severe 2. Coronary artery disease with prior history of ostial RCA stent, 2010 3. Dyspnea particularly on exertion. 4. Hypertension  PLAN:  1. 2-D Doppler echocardiogram to assess the severity of aortic stenosis compare with 2010 study when the aortic valve velocity was 2.6 m/s 2. BNP to rule out heart versus lung etiology for dyspnea on exertion 3. Followup will be based upon the data base.   SUBJECTIVE: [name] is a 79 y.o. female who is now 79 years of age and is accompanied by her son and daughter. They notice that she is having increasing difficulty with dyspnea on exertion. She can barely participate in activities such as walking from her car inside her church without significant shortness of breath. She is not having chest discomfort as she did prior to the RCA stent. She denies orthopnea and PND. There is no peripheral edema. She has not had syncope.   Wt Readings from Last 3 Encounters:  02/16/13 205 lb (92.987 kg)  10/06/12 206 lb 12.8 oz (93.804 kg)  02/09/12 213 lb (96.616 kg)     Past Medical History  Diagnosis Date  . Acute sinusitis   . Anxiety   . Aphthous ulcer   . Breast cancer   . Chronic kidney disease, stage Young (moderate)   . Reflux esophagitis     chronic  . Chronic sinusitis   . CAD (coronary artery disease)   . Depressive disorder   . Pain in joint, site unspecified   . Abnormality of gait   . Edema   . HA (headache)   . Hyperlipemia   . Hypothyroid   . Insomnia   . Memory loss   . Osteoarthritis   . PNA (pneumonia)     Current Outpatient Prescriptions  Medication Sig Dispense Refill  .  buPROPion (WELLBUTRIN SR) 150 MG 12 hr tablet Take 150 mg by mouth daily before breakfast.       . citalopram (CELEXA) 10 MG tablet Take 10 mg by mouth daily.       . diclofenac (VOLTAREN) 50 MG EC tablet Take 50 mg by mouth 2 (two) times daily.      Marland Kitchen HYDROcodone-acetaminophen (NORCO) 5-325 MG per tablet Take 1 tablet by mouth every 6 (six) hours as needed.      Marland Kitchen levothyroxine (SYNTHROID, LEVOTHROID) 50 MCG tablet Take 50 mcg by mouth daily.      . metoprolol succinate (TOPROL-XL) 25 MG 24 hr tablet Take 25 mg by mouth daily.      . Multiple Vitamins-Minerals (MULTIVITAMIN PO) Take 1 tablet by mouth daily.      Marland Kitchen olmesartan-hydrochlorothiazide (BENICAR HCT) 40-12.5 MG per tablet Take 1 tablet by mouth daily.      . pantoprazole (PROTONIX) 40 MG tablet Take 40 mg by mouth daily.      . simvastatin (ZOCOR) 40 MG tablet Take 40 mg by mouth daily.      Marland Kitchen triamcinolone cream (KENALOG) 0.1 %       . zolpidem (AMBIEN) 5 MG tablet Take 1 tablet by mouth as needed.  No current facility-administered medications for this visit.    Allergies:    Allergies  Allergen Reactions  . Betadine [Povidone Iodine]     blisters  . Latex Swelling    hives  . Zestril [Lisinopril]     cough    Social History:  The patient  reports that she quit smoking about 48 years ago. Her smoking use included Cigarettes. She has a 10 pack-year smoking history. She does not have any smokeless tobacco history on file. She reports that she does not drink alcohol or use illicit drugs.   ROS:  Please see the history of present illness.   Denies blood in urine and stool. Appetite is good. Increasing stiffness and soreness in her joints. Decreased mobility.   All other systems reviewed and negative.   OBJECTIVE: VS:  BP 142/74  Pulse 72  Ht 5' (1.524 m)  Wt 205 lb (92.987 kg)  BMI 40.04 kg/m2 Well nourished, well developed, in no acute distress, elderly but stable HEENT: normal Neck: JVD moderate elevation with the  patient lying at 45. Carotid bruit faint bilateral transmitted from the heart  Cardiac:  normal S1, S2; RRR; 3/6 crescendo decrescendo murmur of aortic stenosis Lungs:  clear to auscultation bilaterally, no wheezing, rhonchi or rales Abd: soft, nontender, no hepatomegaly Ext: Edema absent. Pulses 2+ and symmetric Skin: warm and dry Neuro:  CNs 2-12 intact, no focal abnormalities noted  EKG:  Incomplete right bundle branch block. Indeterminate axis.       Signed, [name] Needle III, MD 02/16/2013 10:45 AM  Past Medical History  Hypertension   Hyperlipidemia   Hypothyroidism   CAD with ostial RCA DES by cath 12/2008   Chronic back pain from a fall   Osteoartritis   Non-melanoma skin cancer (02/2010)   Systolic murmur, ? AS   2010 Physician Review Conclusions: 1. Mild concentric left ventricular hypertrophy. 2. Left ventricular ejection fraction estimated by 2D at 60-65 percent. 3. There were no regional wall motion abnormalities. 4. Mild mitral annular calcification. 5. Trace mitral valve regurgitation. 6. Trivial tricuspid regurgitation. 7. Moderate increased thickness and calcification of the trileaflet aortic valve with mildly reduced cusp excursion. 8. Mild to moderate aortic stenosis - peak velocity 2.42m/s, mean gradient , AVA 1.06 cm squared. 9. Analysis of mitral valve inflow, pulmonary vein Doppler and tissue Doppler suggests grade I diastolic dysfunction without elevated left atrial pressure.

## 2013-02-16 NOTE — Patient Instructions (Signed)
Your physician recommends that you continue on your current medications as directed. Please refer to the Current Medication list given to you today.  Lab today: BNP  Your physician has requested that you have an echocardiogram. Echocardiography is a painless test that uses sound waves to create images of your heart. It provides your doctor with information about the size and shape of your heart and how well your heart's chambers and valves are working. This procedure takes approximately one hour. There are no restrictions for this procedure.   Follow up pending lab and echo results

## 2013-02-20 ENCOUNTER — Other Ambulatory Visit: Payer: Self-pay | Admitting: Pathology

## 2013-02-20 ENCOUNTER — Telehealth: Payer: Self-pay

## 2013-02-20 NOTE — Telephone Encounter (Signed)
pt daughter aware of lab results.  nosignificant evidence of fluid build up based on blood work .pt daughter verbalized understanding.

## 2013-02-20 NOTE — Telephone Encounter (Signed)
Message copied by Jarvis Newcomer on Tue Feb 20, 2013  8:34 AM ------      Message from: Verdis Prime      Created: Sat Feb 17, 2013 10:18 AM       nosignificant evidence of fluid build up based on blood work. ------

## 2013-02-21 ENCOUNTER — Encounter: Payer: Self-pay | Admitting: Cardiology

## 2013-02-21 ENCOUNTER — Ambulatory Visit (HOSPITAL_COMMUNITY): Payer: Medicare Other | Attending: Cardiology | Admitting: Radiology

## 2013-02-21 ENCOUNTER — Ambulatory Visit: Payer: Medicare Other | Admitting: Interventional Cardiology

## 2013-02-21 DIAGNOSIS — I350 Nonrheumatic aortic (valve) stenosis: Secondary | ICD-10-CM

## 2013-02-21 DIAGNOSIS — I359 Nonrheumatic aortic valve disorder, unspecified: Secondary | ICD-10-CM

## 2013-02-21 DIAGNOSIS — R0602 Shortness of breath: Secondary | ICD-10-CM | POA: Insufficient documentation

## 2013-02-21 DIAGNOSIS — I079 Rheumatic tricuspid valve disease, unspecified: Secondary | ICD-10-CM | POA: Insufficient documentation

## 2013-02-21 DIAGNOSIS — E785 Hyperlipidemia, unspecified: Secondary | ICD-10-CM | POA: Insufficient documentation

## 2013-02-21 DIAGNOSIS — I2510 Atherosclerotic heart disease of native coronary artery without angina pectoris: Secondary | ICD-10-CM | POA: Insufficient documentation

## 2013-02-21 DIAGNOSIS — R0989 Other specified symptoms and signs involving the circulatory and respiratory systems: Secondary | ICD-10-CM | POA: Insufficient documentation

## 2013-02-21 DIAGNOSIS — I059 Rheumatic mitral valve disease, unspecified: Secondary | ICD-10-CM | POA: Insufficient documentation

## 2013-02-21 DIAGNOSIS — I10 Essential (primary) hypertension: Secondary | ICD-10-CM | POA: Insufficient documentation

## 2013-02-21 DIAGNOSIS — R0609 Other forms of dyspnea: Secondary | ICD-10-CM | POA: Insufficient documentation

## 2013-02-21 NOTE — Progress Notes (Signed)
Echocardiogram performed.  

## 2013-02-27 ENCOUNTER — Telehealth: Payer: Self-pay

## 2013-02-27 DIAGNOSIS — R0609 Other forms of dyspnea: Secondary | ICD-10-CM

## 2013-02-27 NOTE — Telephone Encounter (Addendum)
called to give pt and pt daughter echo results.Aortic valve and heart function are normal. Blood test did not reveal CHF. Needs either cath or nuclear. I would go with nuclear test first given her age.pt sts that her recent mammogram revealed her breast cancer has returned.advised pt daughter that scheduling will contact her to schedule.pt daughter verbalized understanding.

## 2013-02-27 NOTE — Telephone Encounter (Signed)
Message copied by Jarvis Newcomer on Tue Feb 27, 2013  8:36 AM ------      Message from: Verdis Prime      Created: Thu Feb 22, 2013  6:23 PM       Aortic valve and heart function are normal. Blood test did not reveal CHF. Needs either cath or nuclear. I would go with nuclear test first given her age. ------

## 2013-03-02 ENCOUNTER — Other Ambulatory Visit: Payer: Self-pay | Admitting: Pathology

## 2013-03-05 ENCOUNTER — Other Ambulatory Visit: Payer: Self-pay | Admitting: Oncology

## 2013-03-05 DIAGNOSIS — M8580 Other specified disorders of bone density and structure, unspecified site: Secondary | ICD-10-CM

## 2013-03-05 DIAGNOSIS — C50919 Malignant neoplasm of unspecified site of unspecified female breast: Secondary | ICD-10-CM

## 2013-03-05 DIAGNOSIS — C50412 Malignant neoplasm of upper-outer quadrant of left female breast: Secondary | ICD-10-CM

## 2013-03-09 ENCOUNTER — Telehealth: Payer: Self-pay | Admitting: Interventional Cardiology

## 2013-03-09 NOTE — Telephone Encounter (Signed)
New message     New diagnosis of breast cancer.  She will need surgery---will she still need the stress test?

## 2013-03-12 ENCOUNTER — Ambulatory Visit (HOSPITAL_COMMUNITY)
Admission: RE | Admit: 2013-03-12 | Discharge: 2013-03-12 | Disposition: A | Discharge status: Home / Self Care | Payer: Medicare Other | Source: Ambulatory Visit | Attending: Oncology | Admitting: Oncology

## 2013-03-12 ENCOUNTER — Telehealth: Payer: Self-pay | Admitting: Interventional Cardiology

## 2013-03-12 DIAGNOSIS — C50919 Malignant neoplasm of unspecified site of unspecified female breast: Secondary | ICD-10-CM

## 2013-03-12 DIAGNOSIS — Z13820 Encounter for screening for osteoporosis: Secondary | ICD-10-CM | POA: Insufficient documentation

## 2013-03-12 DIAGNOSIS — Z780 Asymptomatic menopausal state: Secondary | ICD-10-CM | POA: Insufficient documentation

## 2013-03-12 DIAGNOSIS — M8580 Other specified disorders of bone density and structure, unspecified site: Secondary | ICD-10-CM

## 2013-03-12 NOTE — Telephone Encounter (Signed)
New Message  Pt daughter called states that the pt is experiencing SOB. She has been diagnosed with cancer "amongst other things" per daughter. Daughter Requests a call back to discuss having a Heart Cath completed vs another stress test. Please call back to discuss

## 2013-03-12 NOTE — Telephone Encounter (Signed)
returned call to pt daughter. pt daughter sts that she woild like her mother to skip her nuclear stress test and go straight th having a cardica cath. adv her that I discussed that with Dr.Smith bfore he went ton vacationa and his preference was for her to have the stress nuclear.pt daughter sts that they are awaiting the results of the nuclear stress test before scheduling pt mastectomy.adv pt daughter that Dr.Smith will be on vacation until mid January 2015. stress test will be read by another physician and they will be called with the results and recommendations.pt daughter verbalized understanding

## 2013-03-13 ENCOUNTER — Ambulatory Visit (HOSPITAL_COMMUNITY)
Admission: RE | Admit: 2013-03-13 | Discharge: 2013-03-13 | Disposition: A | Discharge status: Home / Self Care | Payer: Medicare Other | Source: Ambulatory Visit | Attending: Oncology | Admitting: Oncology

## 2013-03-13 DIAGNOSIS — K838 Other specified diseases of biliary tract: Secondary | ICD-10-CM | POA: Insufficient documentation

## 2013-03-13 DIAGNOSIS — I700 Atherosclerosis of aorta: Secondary | ICD-10-CM | POA: Insufficient documentation

## 2013-03-13 DIAGNOSIS — E079 Disorder of thyroid, unspecified: Secondary | ICD-10-CM | POA: Insufficient documentation

## 2013-03-13 DIAGNOSIS — I2510 Atherosclerotic heart disease of native coronary artery without angina pectoris: Secondary | ICD-10-CM | POA: Insufficient documentation

## 2013-03-13 DIAGNOSIS — C50919 Malignant neoplasm of unspecified site of unspecified female breast: Secondary | ICD-10-CM | POA: Insufficient documentation

## 2013-03-13 DIAGNOSIS — R599 Enlarged lymph nodes, unspecified: Secondary | ICD-10-CM | POA: Insufficient documentation

## 2013-03-13 DIAGNOSIS — N281 Cyst of kidney, acquired: Secondary | ICD-10-CM | POA: Insufficient documentation

## 2013-03-13 DIAGNOSIS — C50412 Malignant neoplasm of upper-outer quadrant of left female breast: Secondary | ICD-10-CM

## 2013-03-13 DIAGNOSIS — K7689 Other specified diseases of liver: Secondary | ICD-10-CM | POA: Insufficient documentation

## 2013-03-13 DIAGNOSIS — I7090 Unspecified atherosclerosis: Secondary | ICD-10-CM | POA: Insufficient documentation

## 2013-03-13 MED ORDER — IOHEXOL 300 MG/ML  SOLN
100.0000 mL | Freq: Once | INTRAMUSCULAR | Status: AC | PRN
  Administered 2013-03-13: 100 mL via INTRAVENOUS

## 2013-03-13 MED ORDER — IOHEXOL 300 MG/ML  SOLN
50.0000 mL | Freq: Once | INTRAMUSCULAR | Status: AC | PRN
  Administered 2013-03-13: 50 mL via ORAL

## 2013-03-19 ENCOUNTER — Encounter: Payer: Self-pay | Admitting: Cardiovascular Disease

## 2013-03-19 ENCOUNTER — Ambulatory Visit (HOSPITAL_COMMUNITY): Payer: Medicare Other | Attending: Interventional Cardiology | Admitting: Radiology

## 2013-03-19 VITALS — BP 139/77 | Ht 60.0 in | Wt 200.0 lb

## 2013-03-19 DIAGNOSIS — I2510 Atherosclerotic heart disease of native coronary artery without angina pectoris: Secondary | ICD-10-CM

## 2013-03-19 DIAGNOSIS — R0989 Other specified symptoms and signs involving the circulatory and respiratory systems: Principal | ICD-10-CM | POA: Insufficient documentation

## 2013-03-19 DIAGNOSIS — R0609 Other forms of dyspnea: Secondary | ICD-10-CM | POA: Insufficient documentation

## 2013-03-19 DIAGNOSIS — I4949 Other premature depolarization: Secondary | ICD-10-CM

## 2013-03-19 MED ORDER — REGADENOSON 0.4 MG/5ML IV SOLN
0.4000 mg | Freq: Once | INTRAVENOUS | Status: AC
  Administered 2013-03-19: 0.4 mg via INTRAVENOUS

## 2013-03-19 MED ORDER — TECHNETIUM TC 99M SESTAMIBI GENERIC - CARDIOLITE
30.0000 | Freq: Once | INTRAVENOUS | Status: AC | PRN
  Administered 2013-03-19: 30 via INTRAVENOUS

## 2013-03-19 MED ORDER — TECHNETIUM TC 99M SESTAMIBI GENERIC - CARDIOLITE
10.0000 | Freq: Once | INTRAVENOUS | Status: AC | PRN
  Administered 2013-03-19: 10 via INTRAVENOUS

## 2013-03-19 NOTE — Progress Notes (Signed)
[facility] [address] [place], [place] [address] [phone]    Cardiology Nuclear Med Study  [name] is a 80 y.o. female     MRN : [id number]     DOB: 1926/02/27  Procedure Date: 03/19/2013  Nuclear Med Background Indication for Stress Test:  Evaluation for Ischemia, Stent Patency, Abnormal EKG, and Pending Surgical Clearance for  (L) Mastectomy by Rolm Bookbinder History:  2011 Cath-Stent-RCA 06/2011 ECHO: EF: 65-70% MPI: NL 79%  Cardiac Risk Factors: Family History - CAD, History of Smoking, Hypertension and RBBB  Symptoms:  DOE, Fatigue, Palpitations and SOB   Nuclear Pre-Procedure Caffeine/Decaff Intake:  None > 12 hrs NPO After: 7:00am   Lungs:  clear O2 Sat: 95% on room air. IV 0.9% NS with Angio Cath:  22g  IV Site: R Antecubital x 1, tolerated well IV Started by:  Irven Baltimore, RN  Chest Size (in):  42 Cup Size: C  Height: 5' (1.524 m)  Weight:  200 lb (90.719 kg)  BMI:  Body mass index is 39.06 kg/(m^2). Tech Comments: Patient took Toprol this am. This patient had freq PVCS,VCuplets, and VBigeminy    Nuclear Med Study 1 or 2 day study: 1 day  Stress Test Type:  Lexiscan  Reading MD: N/A  Order Authorizing Provider:  Daneen Schick, III  Resting Radionuclide: Technetium 43m Sestamibi  Resting Radionuclide Dose: 11.0 mCi   Stress Radionuclide:  Technetium 63m Sestamibi  Stress Radionuclide Dose: 33.0 mCi           Stress Protocol Rest HR: 65 Stress HR: 101  Rest BP: 139/77 Stress BP: 179/71  Exercise Time (min): n/a METS: n/a   Predicted Max HR: 135 bpm % Max HR: 74.81 bpm Rate Pressure Product: 18079   Dose of Adenosine (mg):  n/a Dose of Lexiscan: 0.4 mg  Dose of Atropine (mg): n/a Dose of Dobutamine: n/a mcg/kg/min (at max HR)  Stress Test Technologist: Perrin Maltese, EMT-P  Nuclear Technologist:  Charlton Amor, CNMT     Rest Procedure:  Myocardial perfusion imaging was performed at rest 45 minutes following  the intravenous administration of Technetium 16m Sestamibi. Rest ECG: NSR ICRBBB  Stress Procedure:  The patient received IV Lexiscan 0.4 mg over 15-seconds.  Technetium 29m Sestamibi injected at 30-seconds. This patient had chest tightness and a headache with the Lexiscan injection.  Quantitative spect images were obtained after a 45 minute delay. Stress ECG: No significant change from baseline ECG and There are scattered PVCs.  QPS Raw Data Images:  Normal; no motion artifact; normal heart/lung ratio. Stress Images:  Normal homogeneous uptake in all areas of the myocardium. Rest Images:  Normal homogeneous uptake in all areas of the myocardium. Subtraction (SDS):  Normal Transient Ischemic Dilatation (Normal <1.22):  0.95 Lung/Heart Ratio (Normal <0.45):  0.31  Quantitative Gated Spect Images QGS EDV:  NA QGS ESV:  NA  Impression Exercise Capacity:  Lexiscan with no exercise. BP Response:  Normal blood pressure response. Clinical Symptoms:  Atypical chest pain. ECG Impression:  PVC;s  Comparison with Prior Nuclear Study: No images to compare  Overall Impression:  Normal stress nuclear study.  LV Ejection Fraction: Study not gated.  LV Wall Motion:  NA  Jenkins Rouge

## 2013-03-20 ENCOUNTER — Telehealth: Payer: Self-pay

## 2013-03-20 NOTE — Telephone Encounter (Signed)
called pt and pt daughter.unable to lmom for daughter voicemail was full.pt given results of nuclear study.Normal stress nuclear study.pt verbalized understanding.

## 2013-03-23 ENCOUNTER — Encounter (INDEPENDENT_AMBULATORY_CARE_PROVIDER_SITE_OTHER): Payer: Self-pay

## 2013-03-23 ENCOUNTER — Encounter (INDEPENDENT_AMBULATORY_CARE_PROVIDER_SITE_OTHER): Payer: Self-pay | Admitting: General Surgery

## 2013-03-23 ENCOUNTER — Ambulatory Visit (INDEPENDENT_AMBULATORY_CARE_PROVIDER_SITE_OTHER): Payer: Medicare Other | Admitting: General Surgery

## 2013-03-23 VITALS — BP 128/78 | HR 80 | Temp 98.6°F | Resp 14 | Ht 61.0 in | Wt 205.4 lb

## 2013-03-23 DIAGNOSIS — C50119 Malignant neoplasm of central portion of unspecified female breast: Secondary | ICD-10-CM

## 2013-03-23 DIAGNOSIS — C50112 Malignant neoplasm of central portion of left female breast: Secondary | ICD-10-CM

## 2013-03-25 NOTE — Progress Notes (Signed)
Patient ID: [name], female   DOB: [birth year], 81 y.o.   MRN: [id number]  Chief Complaint  Patient presents with  . New Evaluation    eval invasvie lft br cancer    HPI [name] is a 80 y.o. female.  Referred by Dr [name] HPI 12 yof with multiple medical problems who originally had left breast lumpectomy in 2009 for dcis. She had no further adjuvant treatment.  Then in 2010 she underwent right breast lumpectomy (no sn) that was er/pr pos.  She did receive radiotherapy but stopped tamoxifen due to side effects.  Dr [name] took care of prior cancers but he has recently retired. [name].  Mm and [place] were performed.  Her US shows a 1.9x1.4x1.1 cm mass and left axillary u/s shows a 2.1 cm node with another abnormal node also.  US guided core biopsies of the subareolar mass and node were both performed.  The node is positive for metastatic carcinoma. The breast mass is shows invasive mammary carcinoma that appears to be invasive lobular carcinoma.  This is er positive at 100, pr at 26 and Ki is 34%, her2 neu is not amplified.  [name]. She has recent ct c/a/p that is negative for metastatic disease. She does have recent history of shortness of breath and prior history of stent for cad.  She sees Dr [name],  Past Medical History  Diagnosis Date  . Acute sinusitis   . Anxiety   . Aphthous ulcer   . Breast cancer   . Chronic kidney disease, stage III (moderate)   . Reflux esophagitis     chronic  . Chronic sinusitis   . CAD (coronary artery disease)   . Depressive disorder   . Pain in joint, site unspecified   . Abnormality of gait   . Edema   . HA (headache)   . Hyperlipemia   . Hypothyroid   . Insomnia   . Memory loss   . Osteoarthritis   . PNA (pneumonia)     Past Surgical History  Procedure Laterality Date  . Gallbladder surgery    . Total abdominal hysterectomy    .  Breast lumpectomy      x2  . Carotid stent    . Angioplasty      Family History  Problem Relation Age of Onset  . Heart disease Father   . Heart disease Maternal Grandfather   . Breast cancer Sister     Social History History  Substance Use Topics  . Smoking status: Former Smoker -- 0.50 packs/day for 20 years    Types: Cigarettes    Quit date: 03/15/1964  . Smokeless tobacco: Not on file  . Alcohol Use: No     Comment: social    Allergies  Allergen Reactions  . Betadine [Povidone Iodine]     blisters  . Latex Swelling    hives  . Zestril [Lisinopril]     cough    Current Outpatient Prescriptions  Medication Sig Dispense Refill  . ALPRAZolam (XANAX) 0.25 MG tablet       . buPROPion (WELLBUTRIN SR) 150 MG 12 hr tablet Take 150 mg by mouth daily before breakfast.       . citalopram (CELEXA) 10 MG tablet Take 10 mg by mouth daily.       . diclofenac (VOLTAREN) 50 MG EC tablet Take 50 mg by mouth  2 (two) times daily.      Marland Kitchen HYDROcodone-acetaminophen (NORCO) 5-325 MG per tablet Take 1 tablet by mouth every 6 (six) hours as needed.      Marland Kitchen levothyroxine (SYNTHROID, LEVOTHROID) 50 MCG tablet Take 50 mcg by mouth daily.      . metoprolol succinate (TOPROL-XL) 25 MG 24 hr tablet Take 25 mg by mouth daily.      . Multiple Vitamins-Minerals (MULTIVITAMIN PO) Take 1 tablet by mouth daily.      Marland Kitchen olmesartan-hydrochlorothiazide (BENICAR HCT) 40-12.5 MG per tablet Take 1 tablet by mouth daily.      . simvastatin (ZOCOR) 40 MG tablet Take 40 mg by mouth daily.      Marland Kitchen triamcinolone cream (KENALOG) 0.1 %       . zolpidem (AMBIEN) 5 MG tablet Take 1 tablet by mouth as needed.       No current facility-administered medications for this visit.    Review of Systems Review of Systems  Constitutional: Negative for fever, chills and unexpected weight change.  HENT: Negative for congestion, hearing loss, sore throat, trouble swallowing and voice change.   Eyes: Negative for visual  disturbance.  Respiratory: Negative for cough and wheezing.   Cardiovascular: Negative for chest pain, palpitations and leg swelling.  Gastrointestinal: Negative for nausea, vomiting, abdominal pain, diarrhea, constipation, blood in stool, abdominal distention and anal bleeding.  Genitourinary: Negative for hematuria, vaginal bleeding and difficulty urinating.  Musculoskeletal: Negative for arthralgias.  Skin: Negative for rash and wound.  Neurological: Negative for seizures, syncope and headaches.  Hematological: Negative for adenopathy. Does not bruise/bleed easily.  Psychiatric/Behavioral: Negative for confusion.    Blood pressure 128/78, pulse 80, temperature 98.6 F (37 C), temperature source Temporal, resp. rate 14, height 5' 1" (1.549 m), weight 205 lb 6.4 oz (93.169 kg).  Physical Exam Physical Exam  Vitals reviewed. Constitutional: She appears well-developed and well-nourished.  HENT:  Head: Normocephalic and atraumatic.  Eyes: No scleral icterus.  Neck: Neck supple.  Cardiovascular: Normal rate, regular rhythm and normal heart sounds.   Pulmonary/Chest: Effort normal and breath sounds normal. She has no wheezes. She has no rales. Right breast exhibits no inverted nipple, no mass, no nipple discharge, no skin change and no tenderness. Left breast exhibits inverted nipple and mass (several cm left breast mass with some hematoma). Left breast exhibits no nipple discharge, no skin change and no tenderness.  Abdominal: Soft.  Lymphadenopathy:    She has no cervical adenopathy.    She has no axillary adenopathy.       Right: No supraclavicular adenopathy present.       Left: No supraclavicular adenopathy present.    Data Reviewed Mm/us/path/ct a/p reviewed, Dr [name] note  Assessment    Stage II left breast cancer    Plan    I think as of right now she would need a left mrm. [name].  Her tumor clinically appears to be  about 3 cm and this would necessitate a central lumpectomy which will really be removing most of her breast tissue so I think mastectomy would be much more appropriate. We discussed possible primary endocrine therapy but I think would be more reasonable for her to proceed with surgery.  I will contact Dr Hinton Rao as well as Dr Tamala Julian to discuss how to proceed with plan of scheduling for surgery.  I will see her back after her planned follow up with Dr Tamala Julian regarding recent stress test. We  discussed the staging and pathophysiology of breast cancer. We discussed all of the different options for treatment for breast cancer including surgery, chemotherapy, radiation therapy, Herceptin, and antiestrogen therapy.  We discussed the options for treatment of the breast cancer which included lumpectomy versus a mastectomy. We discussed the performance of the lumpectomy with a wire placement.  We discussed the mastectomy and the postoperative care for that as well. We discussed that there is no difference in her survival whether she undergoes lumpectomy with radiation therapy or antiestrogen therapy versus a mastectomy.  We discussed the risks of operation including bleeding, infection, possible reoperation. She understands her further therapy will be based on what her stages at the time of her operation.         Harutyun Monteverde 03/25/2013, 9:01 PM

## 2013-03-26 NOTE — Telephone Encounter (Signed)
Myoview completed on 03/19/2013.

## 2013-03-27 ENCOUNTER — Encounter (HOSPITAL_COMMUNITY): Payer: Medicare Other

## 2013-03-27 ENCOUNTER — Telehealth: Payer: Self-pay

## 2013-03-27 NOTE — Telephone Encounter (Signed)
Message copied by Lamar Laundry on Tue Mar 27, 2013  8:36 AM ------      Message from: Daneen Schick      Created: Sun Mar 25, 2013  3:48 PM       No evidence blockage. ------

## 2013-03-29 ENCOUNTER — Encounter (INDEPENDENT_AMBULATORY_CARE_PROVIDER_SITE_OTHER): Payer: Self-pay

## 2013-03-29 ENCOUNTER — Telehealth: Payer: Self-pay

## 2013-03-29 NOTE — Telephone Encounter (Signed)
Cardiac clearance  is requested by [facility]. Dr.Wakefield's office. Patient needs to be scheduled for a left mastectomy with axillary dissection.

## 2013-03-29 NOTE — Telephone Encounter (Signed)
Recent cardiac evaluation including a pharmacologic stress nuclear was low risk.  The patient is cleared for general anesthesia and the upcoming breast procedure.

## 2013-04-02 ENCOUNTER — Telehealth (INDEPENDENT_AMBULATORY_CARE_PROVIDER_SITE_OTHER): Payer: Self-pay

## 2013-04-02 NOTE — Telephone Encounter (Signed)
Tried calling pt and pt's daughter but no answer. I was not able to leave any messages with the pt's daughter b/c her mail box is full. I need to give pt an appt to come back and see Dr Donne Hazel since I have received the cardiac clearance from Dr Daneen Schick. I will notify Dr Donne Hazel of the appt.

## 2013-04-03 NOTE — Telephone Encounter (Signed)
LMOM for pt notifying her that I did receive cardiac clearance from Dr Tamala Julian to go ahead and get pt scheduled for surgery. Dr Donne Hazel wants pt to come back to the office to discuss surgery so I made the pt an appt for 04/12/13 arrive at 9:30/9:50. I will also call pt's daughter again to give her the message.  Called pt's daughter to notify her that I was needing for the pt to come back to the office to see Dr Donne Hazel to discuss surgery. I made the pt an appt for 04/12/13 arrive at 9:30/9:50 to discuss surgery. The daughter advised me that the pt is thinking about not doing surgery for a while and just taking the hormonal medicine. The daughter will talk to the pt and let us know what the pt is deciding to do. The pt will keep the appt in place for now. I will notify Dr Donne Hazel.

## 2013-04-04 ENCOUNTER — Encounter (HOSPITAL_COMMUNITY): Payer: Medicare Other

## 2013-04-10 NOTE — Telephone Encounter (Signed)
Called pt's daughter to see about their decision with surgery vs.hormonal therapy. The pt has an appt with Dr Hinton Rao this week and they will make their decision after meeting with Dr Hinton Rao. I just asked for the pt's daughter to call us back after they make their decision so I can notify Dr Donne Hazel. The pt's daughter understands.

## 2013-04-12 ENCOUNTER — Encounter (INDEPENDENT_AMBULATORY_CARE_PROVIDER_SITE_OTHER): Payer: Medicare Other | Admitting: General Surgery

## 2013-04-18 ENCOUNTER — Encounter (INDEPENDENT_AMBULATORY_CARE_PROVIDER_SITE_OTHER): Payer: Self-pay

## 2013-04-20 ENCOUNTER — Telehealth: Payer: Self-pay | Admitting: Interventional Cardiology

## 2013-04-20 NOTE — Telephone Encounter (Signed)
returned pt call.pt rqst a copy of her nuclear stress test be fwd to her pcp Dr. Bea Graff. called to get a fax # Dr.Grisso office is closed.i will call back on Mon.pt sts that she is having increased sob and would like Dr.Smith to know adv her I would fwd him the message and call back with his recommendation.pt verbalized understanding.

## 2013-04-20 NOTE — Telephone Encounter (Signed)
New message  ° ° °Test results  °

## 2013-04-23 NOTE — Telephone Encounter (Signed)
Copy of pt nuclear stress test faxed to pt pcp Dr.grisso at pt rqst

## 2013-08-08 ENCOUNTER — Ambulatory Visit: Payer: Self-pay

## 2013-08-15 ENCOUNTER — Ambulatory Visit (INDEPENDENT_AMBULATORY_CARE_PROVIDER_SITE_OTHER): Payer: Medicare Other

## 2013-08-15 VITALS — BP 165/69 | HR 68 | Resp 18

## 2013-08-15 DIAGNOSIS — L03039 Cellulitis of unspecified toe: Secondary | ICD-10-CM

## 2013-08-15 DIAGNOSIS — L600 Ingrowing nail: Secondary | ICD-10-CM

## 2013-08-15 DIAGNOSIS — M79609 Pain in unspecified limb: Secondary | ICD-10-CM

## 2013-08-15 MED ORDER — CEPHALEXIN 500 MG PO CAPS: 500.0000 mg | ORAL_CAPSULE | Freq: Three times a day (TID) | ORAL | Status: DC

## 2013-08-15 NOTE — Progress Notes (Signed)
   Subjective:    Patient ID: [name], female    DOB: Mar 11, 1926, 80 y.o.   MRN: [id number]  HPI I HAVE AN INGROWN ON THE LEFT BIG TOENAIL AND HAS BEEN GOING ON FOR ABOUT A MONTH AND NO BURNING OR THROBBING AND IS SORE AND TENDER AND THERE IS NO DRAINING AND I DO GET PEDICURES    Review of Systems  HENT: Positive for hearing loss and sinus pressure.   Respiratory: Positive for shortness of breath.   Endocrine: Positive for cold intolerance.  Musculoskeletal: Positive for gait problem.       Joint pain   All other systems reviewed and are negative.      Objective:   Physical Exam Vascular status is intact with pedal pulses palpable DP and PT +2/4 capillary refill time 3 seconds skin temperature warm turgor normal no edema rubor pallor or varicosities noted there is edema and erythema medial lateral nail folds of the left hallux with pain tenderness discomfort secondary to ingrown nail patient had previous nail procedure done on contralateral right great toe which now show some thickening discoloration may be candidate for future topical treatment with a topical antifungal we'll discuss that at her followup for postop visit. Orthopedic biomechanical exam otherwise unremarkable noncontributory open wounds or ulcerations are noted       Assessment & Plan:  Assessment this time is ingrowing hallux nail medial lateral borders left great toe risk of facial tremors or you this time recommendation for nail excision and phenol matricectomy the medial lateral borders local anesthetic block administered in first performed the digit is exsanguinated with Coflex in a digital tourniquet was applied the medial lateral borders were excised followed by phenol matricectomy. Silvadene cream and gauze dressing were applied patient is given instructions for daily and nitroglycerin soap and warm water cleansing and applying Neosporin and Band-Aid dressing daily Reglan Tylenol aspirin Advil as needed for  pain prescription for cephalexin is oriented to her pharmacy patient be recheck in 2 weeks for followup advised to contact me if there are any changes in the interim. Next  [name] DPM

## 2013-08-15 NOTE — Patient Instructions (Signed)
ANTIBACTERIAL SOAP INSTRUCTIONS  THE DAY AFTER PROCEDURE  Please follow the instructions your doctor has marked.   Shower as usual. Before getting out, place a drop of antibacterial liquid soap (Dial) on a wet, clean washcloth.  Gently wipe washcloth over affected area.  Afterward, rinse the area with warm water.  Blot the area dry with a soft cloth and cover with antibiotic ointment (neosporin, polysporin, bacitracin) and band aid or gauze and tape  Place 3-4 drops of antibacterial liquid soap in a quart of warm tap water.  Submerge foot into water for 20 minutes.  If bandage was applied after your procedure, leave on to allow for easy lift off, then remove and continue with soak for the remaining time.  Next, blot area dry with a soft cloth and cover with a bandage.  Apply other medications as directed by your doctor, such as cortisporin otic solution (eardrops) or neosporin antibiotic ointment  Cleanse daily antibacterial soap and warm water apply Neosporin and Band-Aid dressing if causing skin irritation can switch to Epsom salts in warm water and Band-Aid dress

## 2013-08-29 ENCOUNTER — Ambulatory Visit (INDEPENDENT_AMBULATORY_CARE_PROVIDER_SITE_OTHER): Payer: Medicare Other

## 2013-08-29 DIAGNOSIS — L600 Ingrowing nail: Secondary | ICD-10-CM

## 2013-08-29 DIAGNOSIS — Z09 Encounter for follow-up examination after completed treatment for conditions other than malignant neoplasm: Secondary | ICD-10-CM

## 2013-08-29 NOTE — Patient Instructions (Signed)

## 2013-08-29 NOTE — Progress Notes (Signed)
   Subjective:    Patient ID: [name], female    DOB: [birth year], 80 y.o.   MRN: [id number]  HPI I HAD AN INGROWN TAKEN OUT AND IT IS DOING GOOD ON MY LEFT BIG TOE    Review of Systems no new findings or systemic changes noted    Objective:   Physical Exam Neurovascular status is intact and unchanged the left hallux medial lateral borders show some slight eschar tissue no discharge drainage no pain no discomfort never had any pain on the procedure patient ambulate comfortably. No other new changes or findings or new complaints at this time patient ready for discharge from our care       Assessment & Plan:  Assessment resolving paronychia ingrowing nail following AP nail procedure is both borders left great toe patient discharge to an as-needed basis for any future followup maintain normal palliative care for nails patient is going to and nail salon in the future she has any further difficulties we'll contact us as needed  [name] DPM

## 2013-09-05 ENCOUNTER — Encounter (INDEPENDENT_AMBULATORY_CARE_PROVIDER_SITE_OTHER): Payer: Self-pay | Admitting: Surgery

## 2013-12-28 ENCOUNTER — Ambulatory Visit (INDEPENDENT_AMBULATORY_CARE_PROVIDER_SITE_OTHER): Payer: Medicare Other

## 2013-12-28 VITALS — BP 118/64 | HR 64 | Resp 12

## 2013-12-28 DIAGNOSIS — R52 Pain, unspecified: Secondary | ICD-10-CM

## 2013-12-28 DIAGNOSIS — M2041 Other hammer toe(s) (acquired), right foot: Secondary | ICD-10-CM

## 2013-12-28 DIAGNOSIS — M19071 Primary osteoarthritis, right ankle and foot: Secondary | ICD-10-CM

## 2013-12-28 NOTE — Progress Notes (Signed)
   Subjective:    Patient ID: [name], female    DOB: October 27, 1925, 80 y.o.   MRN: [id number]  HPI  PT STATED RT FOOT 3RD TOE IS GOING UNDER TO THE OTHER TOE AND GET UNCONFORTABLE FOR 3 MONTHS. THE TOE IS NOT WORSE AND GET AGGRAVATED BY WALKING. TRIED NO TREATMENT.  Review of Systems  All other systems reviewed and are negative.      Objective:   Physical Exam 80 year old white female well-developed well-nourished oriented x3 presents at this time with some discomfort in pain point her third toe right foot which is under lapping second in fact her second toe is dorsally dorsally displaced and third fourth and fifth toes are adductovarus rotation. Patient is about 3 months status post fracture of the proximal phalanx of the right hallux which appears to be consolidating well although his rigidity at the MTP and IP joints. Her symptoms are now occurring in the second and third toe area with the second toe overlapping the third  Masker status is intact although diminished pedal pulses DP and PT plus one over 4 bilateral capillary refill time 3 seconds all digits epicritic and proprioceptive sensations intact and symmetric there is no plantar response DTRs not elicited mild varicosities are noted mild edema noted. On exam reveals semirigid contractures lesser digits adductovarus rotated third fourth and fifth on the right there is dorsal medial displacement of the second the hallux also has some contracture history of fracture the proximal phalanx swelling and he was going to appear stable although this wasn't rigidity at the IP joint noted. X-rays confirm arthrosis at the MTP and IP joints lesser digits adductovarus rotated 34 and 5 there to under lapping second was an irritation to the nail fold.       Assessment & Plan:  Assessment hammertoe deformity with Dr. arthropathy and digital contractures and arrangements at the MTP and IP joints of the lesser digits 234 and 5 most significant third  toe right is under lapping second. At this time discussed options patient would be candidate for surgery soap in the future however based on age and overall health wishes to consider conservative care my recommendation at this time is some tube foam padding some cushion tube foam and a silicone sleeve are dispensed for her third toe right foot these provide some temporary relief there is no keratoses no open wounds no secondary infection is noted maintain cushion to foam pads maintain a straight shoe loosefitting shoe currently wearing of [place] athletic type shoe. Reappointed future and as-needed basis dispensed several samples of tube foam and a single silicone sleeve patient is advised she may purchase additional foam sleeves or silicone sleeves at the desk anytime without need to be seen again. However if symptoms persist or there is a new exacerbations will follow up in the future as needed next  [name] DPM

## 2013-12-28 NOTE — Patient Instructions (Signed)

## 2014-01-11 ENCOUNTER — Telehealth: Payer: Self-pay | Admitting: Interventional Cardiology

## 2014-01-11 NOTE — Telephone Encounter (Signed)
Returned pt call.  Pt reports that she is having sob when she walks a good distance. Pt denies chest pain, le edema, syncope, fever, cough. Pt cardiac work up in Jan was echo,lab,myoview were ok. Pt adv to keep appt already scheduled on 11/12. Pt adv to call the office if sob gets worse, or additional cardiac symptoms develop. Pt verbalized understanding.

## 2014-01-11 NOTE — Telephone Encounter (Signed)
New Message  Pt called states that she is experiencing SOB when she walks around the house.Marland Kitchen Requests an ov with the Dr. Hanley Seamen her  The next available with PA. However the pt req a call back to discuss.

## 2014-01-24 ENCOUNTER — Ambulatory Visit (INDEPENDENT_AMBULATORY_CARE_PROVIDER_SITE_OTHER): Payer: Medicare Other | Admitting: Physician Assistant

## 2014-01-24 ENCOUNTER — Encounter: Payer: Self-pay | Admitting: Physician Assistant

## 2014-01-24 VITALS — BP 136/84 | HR 76 | Ht 61.0 in | Wt 204.0 lb

## 2014-01-24 DIAGNOSIS — R0600 Dyspnea, unspecified: Secondary | ICD-10-CM

## 2014-01-24 DIAGNOSIS — I2510 Atherosclerotic heart disease of native coronary artery without angina pectoris: Secondary | ICD-10-CM

## 2014-01-24 DIAGNOSIS — I10 Essential (primary) hypertension: Secondary | ICD-10-CM

## 2014-01-24 DIAGNOSIS — I350 Nonrheumatic aortic (valve) stenosis: Secondary | ICD-10-CM

## 2014-01-24 NOTE — Assessment & Plan Note (Addendum)
See comments under aortic stenosis. Recommend patient continue with physical therapy.  Thyroid functions followed by her primary care provider.  She believes she had her TSH  checked within the last 6 months

## 2014-01-24 NOTE — Assessment & Plan Note (Signed)
Based on exam I do not think the patient's aortic stenosis has progressed however, it is certainly possible.  We did an ambulatory O2 sat which was between 93-95%. Stationary was 94%.  We'll check a 2-D echocardiogram.

## 2014-01-24 NOTE — Assessment & Plan Note (Signed)
stable °

## 2014-01-24 NOTE — Progress Notes (Signed)
Date:  01/24/2014   ID:  [name], DOB 1925/11/18, MRN [id number]  PCP:  Gilford Rile, MD  Primary Cardiologist:  Tamala Julian     History of Present Illness: [name] is a 80 y.o. female coronary artery disease, mild to moderate aortic stenosis, hyperlipidemia, hypertension,hypothyroidism, chronic kidney disease stage III. Her last 2-D echocardiogram was December 2014.  Her ejection fraction 60-65% with normal wall motion. Mean and peak aortic valve gradients were 19 and 34 mmHg respectively. Mild AI.  Peak PA pressure was 33 mmHg.  Patient presents today with complaints of dyspnea on exertion.. She states this has been worsening for the last 3 months.  She'll also reports some diaphoresis at times.  She denies any chest pain or pressure, back pain neck pain or left arm pain. She is not short of breath at rest and otherwise denies nausea, vomiting, fever, orthopnea, dizziness, PND, cough, congestion, abdominal pain, hematochezia, melena, lower extremity edema, claudication.  Wt Readings from Last 3 Encounters:  01/24/14 92.534 kg (204 lb)  03/23/13 93.169 kg (205 lb 6.4 oz)  03/19/13 90.719 kg (200 lb)     Past Medical History  Diagnosis Date  . Acute sinusitis   . Anxiety   . Aphthous ulcer   . Breast cancer   . Chronic kidney disease, stage III (moderate)   . Reflux esophagitis     chronic  . Chronic sinusitis   . CAD (coronary artery disease)   . Depressive disorder   . Pain in joint, site unspecified   . Abnormality of gait   . Edema   . HA (headache)   . Hyperlipemia   . Hypothyroid   . Insomnia   . Memory loss   . Osteoarthritis   . PNA (pneumonia)     Current Outpatient Prescriptions  Medication Sig Dispense Refill  . ALPRAZolam (XANAX) 0.25 MG tablet Take 0.25 mg by mouth daily.     Marland Kitchen buPROPion (WELLBUTRIN SR) 150 MG 12 hr tablet Take 150 mg by mouth daily before breakfast.     . citalopram (CELEXA) 10 MG tablet Take 10 mg by mouth daily.     .  diclofenac (VOLTAREN) 50 MG EC tablet Take 50 mg by mouth 2 (two) times daily.    . eszopiclone (LUNESTA) 2 MG TABS tablet     . HYDROcodone-acetaminophen (NORCO) 5-325 MG per tablet Take 1 tablet by mouth every 6 (six) hours as needed.    Marland Kitchen letrozole (FEMARA) 2.5 MG tablet     . levothyroxine (SYNTHROID, LEVOTHROID) 50 MCG tablet Take 50 mcg by mouth daily.    . metoprolol succinate (TOPROL-XL) 25 MG 24 hr tablet Take 25 mg by mouth daily.    . Multiple Vitamins-Minerals (MULTIVITAMIN PO) Take 1 tablet by mouth daily.    Marland Kitchen olmesartan-hydrochlorothiazide (BENICAR HCT) 40-12.5 MG per tablet Take 1 tablet by mouth daily.    . simvastatin (ZOCOR) 40 MG tablet Take 40 mg by mouth daily.    . cephALEXin (KEFLEX) 500 MG capsule Take 1 capsule (500 mg total) by mouth 3 (three) times daily. 30 capsule 0  . DYMISTA 137-50 MCG/ACT SUSP     . NASONEX 50 MCG/ACT nasal spray     . triamcinolone cream (KENALOG) 0.1 %     . zolpidem (AMBIEN) 5 MG tablet Take 1 tablet by mouth as needed.     No current facility-administered medications for this visit.    Allergies:    Allergies  Allergen Reactions  .  Betadine [Povidone Iodine]     blisters  . Latex Swelling    hives  . Zestril [Lisinopril]     cough    Social History:  The patient  reports that she quit smoking about 49 years ago. Her smoking use included Cigarettes. She has a 10 pack-year smoking history. She has never used smokeless tobacco. She reports that she drinks alcohol. She reports that she does not use illicit drugs.   Family history:   Family History  Problem Relation Age of Onset  . Heart disease Father   . Heart disease Maternal Grandfather   . Breast cancer Sister     ROS:  Please see the history of present illness.  All other systems reviewed and negative.   PHYSICAL EXAM: VS:  BP 136/84 mmHg  Pulse 76  Ht 5\' 1"  (1.549 m)  Wt 92.534 kg (204 lb)  BMI 38.57 kg/m2 obese, well developed, in no acute distress HEENT: Pupils  are equal round react to light accommodation extraocular movements are intact.  Neck: no JVDNo cervical lymphadenopathy. Cardiac: Regular rate and rhythm with 2/6 systolic murmur Lungs:  clear to auscultation bilaterally, no wheezing, rhonchi or rales Abd: soft, nontender, positive bowel sounds all quadrants, no hepatosplenomegaly Ext: no lower extremity edema.  2+ radial and dorsalis pedis pulses. Skin: warm and dry Neuro:  Grossly normal  EKG:    Normal sinus rhythm with incomplete right bundle branch block and first-degree AV block rate 76 bpm  ASSESSMENT AND PLAN:  Problem List Items Addressed This Visit    Aortic stenosis    Based on exam I do not think the patient's aortic stenosis has progressed however, it is certainly possible.  We did an ambulatory O2 sat which was between 93-95%. Stationary was 94%.  We'll check a 2-D echocardiogram.    Relevant Orders      EKG 12-Lead      2D Echocardiogram without contrast   Coronary atherosclerosis of native coronary artery    stable    Dyspnea    See comments under aortic stenosis. Recommend patient continue with physical therapy.  Thyroid functions followed by her primary care provider.  She believes she had her TSH  checked within the last 6 months    Relevant Orders      EKG 12-Lead      2D Echocardiogram without contrast   Essential hypertension, benign - Primary    Blood pressure is controlled at this time to change to current therapy    Relevant Orders      EKG 12-Lead      2D Echocardiogram without contrast

## 2014-01-24 NOTE — Patient Instructions (Signed)
Your physician recommends that you continue on your current medications as directed. Please refer to the Current Medication list given to you today.  Your physician has requested that you have an echocardiogram. Echocardiography is a painless test that uses sound waves to create images of your heart. It provides your doctor with information about the size and shape of your heart and how well your heart's chambers and valves are working. This procedure takes approximately one hour. There are no restrictions for this procedure. Tuesday, November 17th @ 2:00 pm    Your physician recommends that you keep your scheduled follow-up appointment with Dr. Tamala Julian on February 3 @ 2:15.

## 2014-01-24 NOTE — Assessment & Plan Note (Signed)
Blood pressure is controlled at this time to change to current therapy

## 2014-01-25 ENCOUNTER — Other Ambulatory Visit (HOSPITAL_COMMUNITY): Payer: Medicare Other

## 2014-01-28 ENCOUNTER — Other Ambulatory Visit (HOSPITAL_COMMUNITY): Payer: Medicare Other

## 2014-01-29 ENCOUNTER — Other Ambulatory Visit (HOSPITAL_COMMUNITY): Payer: Medicare Other

## 2014-03-27 ENCOUNTER — Ambulatory Visit (HOSPITAL_COMMUNITY): Payer: Medicare Other | Attending: Cardiovascular Disease | Admitting: Cardiology

## 2014-03-27 DIAGNOSIS — I350 Nonrheumatic aortic (valve) stenosis: Secondary | ICD-10-CM | POA: Diagnosis not present

## 2014-03-27 DIAGNOSIS — E785 Hyperlipidemia, unspecified: Secondary | ICD-10-CM | POA: Diagnosis not present

## 2014-03-27 DIAGNOSIS — R0600 Dyspnea, unspecified: Secondary | ICD-10-CM

## 2014-03-27 DIAGNOSIS — I10 Essential (primary) hypertension: Secondary | ICD-10-CM

## 2014-03-27 NOTE — Progress Notes (Signed)
Echo performed. 

## 2014-03-28 ENCOUNTER — Ambulatory Visit: Payer: Medicare Other | Admitting: Podiatry

## 2014-03-29 ENCOUNTER — Telehealth: Payer: Self-pay | Admitting: Interventional Cardiology

## 2014-03-29 NOTE — Telephone Encounter (Signed)
-----   Message from Brett Canales, PA-C sent at 03/29/2014 10:33 AM EST ----- Please let the patient know there was no change in her aortic valve or EF.  The right heart pressures are elevated more than before.  She has an appt with Dr. Tamala Julian on [address]

## 2014-03-29 NOTE — Telephone Encounter (Signed)
New Message  Pt called for ECHO Results

## 2014-03-29 NOTE — Telephone Encounter (Signed)
Pt aware of echo results.there was no change in her aortic valve or EF. The right heart pressures are elevated more than before. She has an appt with Dr. Tamala Julian on [place] that she has been having some exertional dyspnea. She sts that she has had pulmonary test done with her pcp and they have been normal. She denies chest pain, swelling, fever, cough. Offered her an earlier appt with the PA/NP she declined. She will keep her appt with Dr.Smith on 2/3, she will call back if symptoms worsen.

## 2014-04-17 ENCOUNTER — Ambulatory Visit: Payer: Medicare Other | Admitting: Interventional Cardiology

## 2014-04-24 ENCOUNTER — Encounter: Payer: Self-pay | Admitting: Interventional Cardiology

## 2014-09-30 ENCOUNTER — Ambulatory Visit: Payer: Medicare Other | Admitting: Podiatry

## 2015-01-09 DIAGNOSIS — Z23 Encounter for immunization: Secondary | ICD-10-CM | POA: Diagnosis not present

## 2015-01-09 DIAGNOSIS — C50912 Malignant neoplasm of unspecified site of left female breast: Secondary | ICD-10-CM | POA: Diagnosis not present

## 2015-01-09 DIAGNOSIS — Z79811 Long term (current) use of aromatase inhibitors: Secondary | ICD-10-CM

## 2015-01-09 DIAGNOSIS — Z170 Estrogen receptor positive status [ER+]: Secondary | ICD-10-CM | POA: Diagnosis not present

## 2015-01-09 DIAGNOSIS — M8589 Other specified disorders of bone density and structure, multiple sites: Secondary | ICD-10-CM | POA: Diagnosis not present

## 2015-04-07 ENCOUNTER — Encounter: Payer: Self-pay | Admitting: Podiatry

## 2015-04-07 ENCOUNTER — Ambulatory Visit (INDEPENDENT_AMBULATORY_CARE_PROVIDER_SITE_OTHER): Payer: Medicare Other | Admitting: Podiatry

## 2015-04-07 VITALS — BP 158/78 | HR 74 | Resp 14

## 2015-04-07 DIAGNOSIS — M79676 Pain in unspecified toe(s): Secondary | ICD-10-CM | POA: Diagnosis not present

## 2015-04-07 DIAGNOSIS — M79609 Pain in unspecified limb: Secondary | ICD-10-CM

## 2015-04-07 DIAGNOSIS — B351 Tinea unguium: Secondary | ICD-10-CM

## 2015-04-07 DIAGNOSIS — R52 Pain, unspecified: Secondary | ICD-10-CM

## 2015-04-07 DIAGNOSIS — L03032 Cellulitis of left toe: Secondary | ICD-10-CM | POA: Diagnosis not present

## 2015-04-07 NOTE — Progress Notes (Signed)
Subjective:     Patient ID: [name], female   DOB: [birth year], 82 y.o.   MRN: [id number]  HPI this patient presents the office with chief complaint of a painful left great toe for 2 weeks now. She states that she has been having her nails worked on at the [facility] [employer]. Following her last  visit to the nail salon the inside border of the big toe of the left  foot  became red and painful. She states that the toe has improved since last week, but she  desires that  the toe to be evaluated. She also says she is interested in having her nails worked on by myself at today's visit.  She presents for preventive foot care services.   Review of Systems     Objective:   Physical Exam GENERAL APPEARANCE: Alert, conversant. Appropriately groomed. No acute distress.  VASCULAR: Pedal pulses palpable at  [facility] and PT bilateral.  Capillary refill time is immediate to all digits,  Normal temperature gradient.  Digital hair growth is present bilateral  NEUROLOGIC: sensation is normal to 5.07 monofilament at 5/5 sites bilateral.  Light touch is intact bilateral, Muscle strength normal.  MUSCULOSKELETAL: acceptable muscle strength, tone and stability bilateral.  Intrinsic muscluature intact bilateral.  Rectus appearance of foot and digits noted bilateral.   DERMATOLOGIC: skin color, texture, and turgor are within normal limits.  No preulcerative lesions or ulcers  are seen, no interdigital maceration noted.  No open lesions present.   No drainage noted.  NAILS  There is dried  abscess noted medial border left great toe.  Thick disfigured discolored nails both feet.      Assessment:     Paronychia left hallux.  Onychomycosis     Plan:     I & D paronychia medial border left hallux.  Onychomycosis B/L   Gardiner Barefoot DPM

## 2015-04-11 DIAGNOSIS — M8580 Other specified disorders of bone density and structure, unspecified site: Secondary | ICD-10-CM | POA: Diagnosis not present

## 2015-04-11 DIAGNOSIS — C50412 Malignant neoplasm of upper-outer quadrant of left female breast: Secondary | ICD-10-CM | POA: Diagnosis not present

## 2015-06-05 ENCOUNTER — Ambulatory Visit: Payer: Medicare Other | Admitting: Sports Medicine

## 2015-06-24 DIAGNOSIS — E041 Nontoxic single thyroid nodule: Secondary | ICD-10-CM

## 2015-06-24 DIAGNOSIS — R5383 Other fatigue: Secondary | ICD-10-CM

## 2015-06-24 DIAGNOSIS — M159 Polyosteoarthritis, unspecified: Secondary | ICD-10-CM

## 2015-06-24 DIAGNOSIS — N183 Chronic kidney disease, stage 3 unspecified: Secondary | ICD-10-CM

## 2015-06-24 DIAGNOSIS — M150 Primary generalized (osteo)arthritis: Secondary | ICD-10-CM

## 2015-06-24 DIAGNOSIS — K2100 Gastro-esophageal reflux disease with esophagitis, without bleeding: Secondary | ICD-10-CM | POA: Insufficient documentation

## 2015-06-24 DIAGNOSIS — F339 Major depressive disorder, recurrent, unspecified: Secondary | ICD-10-CM | POA: Insufficient documentation

## 2015-06-24 DIAGNOSIS — F5101 Primary insomnia: Secondary | ICD-10-CM | POA: Insufficient documentation

## 2015-06-24 DIAGNOSIS — Z79899 Other long term (current) drug therapy: Secondary | ICD-10-CM

## 2015-06-24 DIAGNOSIS — E039 Hypothyroidism, unspecified: Secondary | ICD-10-CM

## 2015-06-24 DIAGNOSIS — F419 Anxiety disorder, unspecified: Secondary | ICD-10-CM

## 2015-06-24 DIAGNOSIS — R269 Unspecified abnormalities of gait and mobility: Secondary | ICD-10-CM | POA: Insufficient documentation

## 2015-06-24 DIAGNOSIS — C50919 Malignant neoplasm of unspecified site of unspecified female breast: Secondary | ICD-10-CM | POA: Insufficient documentation

## 2015-06-24 DIAGNOSIS — R5381 Other malaise: Secondary | ICD-10-CM | POA: Insufficient documentation

## 2015-06-24 DIAGNOSIS — I2510 Atherosclerotic heart disease of native coronary artery without angina pectoris: Secondary | ICD-10-CM | POA: Insufficient documentation

## 2015-06-24 DIAGNOSIS — I25119 Atherosclerotic heart disease of native coronary artery with unspecified angina pectoris: Secondary | ICD-10-CM | POA: Insufficient documentation

## 2015-06-24 DIAGNOSIS — C50112 Malignant neoplasm of central portion of left female breast: Secondary | ICD-10-CM | POA: Insufficient documentation

## 2015-06-24 DIAGNOSIS — D631 Anemia in chronic kidney disease: Secondary | ICD-10-CM

## 2015-06-24 HISTORY — DX: Anxiety disorder, unspecified: F41.9

## 2015-06-24 HISTORY — DX: Primary generalized (osteo)arthritis: M15.0

## 2015-06-24 HISTORY — DX: Polyosteoarthritis, unspecified: M15.9

## 2015-06-24 HISTORY — DX: Anemia in chronic kidney disease: D63.1

## 2015-06-24 HISTORY — DX: Atherosclerotic heart disease of native coronary artery without angina pectoris: I25.10

## 2015-06-24 HISTORY — DX: Chronic kidney disease, stage 3 unspecified: N18.30

## 2015-06-24 HISTORY — DX: Primary insomnia: F51.01

## 2015-06-24 HISTORY — DX: Malignant neoplasm of central portion of left female breast: C50.112

## 2015-06-24 HISTORY — DX: Hypothyroidism, unspecified: E03.9

## 2015-06-24 HISTORY — DX: Gastro-esophageal reflux disease with esophagitis, without bleeding: K21.00

## 2015-06-24 HISTORY — DX: Nontoxic single thyroid nodule: E04.1

## 2015-06-24 HISTORY — DX: Other malaise: R53.81

## 2015-06-24 HISTORY — DX: Unspecified abnormalities of gait and mobility: R26.9

## 2015-06-24 HISTORY — DX: Major depressive disorder, recurrent, unspecified: F33.9

## 2015-06-24 HISTORY — DX: Other long term (current) drug therapy: Z79.899

## 2015-06-24 HISTORY — DX: Atherosclerotic heart disease of native coronary artery with unspecified angina pectoris: I25.119

## 2015-06-25 DIAGNOSIS — H9193 Unspecified hearing loss, bilateral: Secondary | ICD-10-CM

## 2015-06-25 HISTORY — DX: Unspecified hearing loss, bilateral: H91.93

## 2015-07-07 ENCOUNTER — Ambulatory Visit: Payer: Medicare Other | Admitting: Podiatry

## 2015-07-11 DIAGNOSIS — C50912 Malignant neoplasm of unspecified site of left female breast: Secondary | ICD-10-CM | POA: Diagnosis not present

## 2015-07-23 DIAGNOSIS — R413 Other amnesia: Secondary | ICD-10-CM | POA: Insufficient documentation

## 2015-07-23 HISTORY — DX: Other amnesia: R41.3

## 2015-08-08 DIAGNOSIS — C50912 Malignant neoplasm of unspecified site of left female breast: Secondary | ICD-10-CM | POA: Diagnosis not present

## 2015-10-02 DIAGNOSIS — C50912 Malignant neoplasm of unspecified site of left female breast: Secondary | ICD-10-CM

## 2015-12-18 ENCOUNTER — Ambulatory Visit (INDEPENDENT_AMBULATORY_CARE_PROVIDER_SITE_OTHER): Payer: Medicare Other | Admitting: Allergy and Immunology

## 2015-12-18 ENCOUNTER — Encounter: Payer: Self-pay | Admitting: Allergy and Immunology

## 2015-12-18 VITALS — BP 150/80 | HR 76 | Temp 98.4°F | Resp 22 | Ht 59.06 in | Wt 197.4 lb

## 2015-12-18 DIAGNOSIS — J3089 Other allergic rhinitis: Secondary | ICD-10-CM | POA: Diagnosis not present

## 2015-12-18 DIAGNOSIS — K219 Gastro-esophageal reflux disease without esophagitis: Secondary | ICD-10-CM

## 2015-12-18 MED ORDER — IPRATROPIUM BROMIDE 0.06 % NA SOLN: 2.0000 | Freq: Three times a day (TID) | NASAL | 5 refills | Status: DC

## 2015-12-18 MED ORDER — RANITIDINE HCL 300 MG PO TABS
300.0000 mg | ORAL_TABLET | Freq: Every day | ORAL | 5 refills | Status: DC
Start: 2015-12-18 — End: 2016-12-27

## 2015-12-18 MED ORDER — OMEPRAZOLE 40 MG PO CPDR: 40.0000 mg | DELAYED_RELEASE_CAPSULE | Freq: Every day | ORAL | 5 refills | Status: DC

## 2015-12-18 NOTE — Progress Notes (Signed)
Dear Dr. Bea Graff,  Thank you for referring [name] to the [facility] on 12/18/2015.   Below is a summation of this patient's evaluation and recommendations.  Thank you for your referral. I will keep you informed about this patient's response to treatment.   If you have any questions please to do hesitate to contact me.   Sincerely,  Jiles Prows, MD [facility]   ______________________________________________________________________    NEW PATIENT NOTE  Referring Provider: [name], MD Primary Provider: Gilford Rile, MD Date of office visit: 12/18/2015    Subjective:   Chief Complaint:  [name] (DOB: 09-27-1925) is a 82 y.o. female who presents to the clinic on 12/18/2015 with a chief complaint of Sinus Problem (drainage, headache, forehead tenderness, drippy nose) .     HPI: [name] presents to this clinic in evaluation of "sinus". She states that she's had a very bad 2 years with a problem that develops whenever she lays down to go to sleep. She will develop excessive amounts of drainage that drops into her stomach and makes her nauseated. She has throat clearing and choking and occasional coughing and intermittent hoarse voice. She may have some slight nasal congestion but no other nasal issues. She does not sneeze nor does she have any anosmia or ugly nasal discharge. When she wakes up at night to urinate she cannot go back to sleep because as soon as she lays down she goes to the same process with drainage and throat clearing and slight cough. She has seen Dr. Melina Modena and has had a sinus CT scan which has been normal and has been given various treatments including nasal sprays which did not help her. She takes Benadryl at nighttime which basically makes her go to sleep.  Past Medical History:  Diagnosis Date  . Abnormality of gait   . Acute sinusitis   . Anxiety     . Aphthous ulcer   . Breast cancer ([place])   . CAD (coronary artery disease)   . Chronic kidney disease, stage III (moderate)   . Chronic sinusitis   . Depressive disorder   . Edema   . HA (headache)   . Hyperlipemia   . Hypothyroid   . Insomnia   . Memory loss   . Osteoarthritis   . Pain in joint, site unspecified   . PNA (pneumonia)   . Reflux esophagitis    chronic    Past Surgical History:  Procedure Laterality Date  . ANGIOPLASTY    . BREAST LUMPECTOMY     x2  . CAROTID STENT    . GALLBLADDER SURGERY    . TOTAL ABDOMINAL HYSTERECTOMY        Medication List      ALPRAZolam 0.25 MG tablet Commonly known as:  XANAX Take 0.25 mg by mouth daily.   buPROPion 150 MG 12 hr tablet Commonly known as:  WELLBUTRIN SR Take 150 mg by mouth daily before breakfast.   citalopram 10 MG tablet Commonly known as:  CELEXA Take 10 mg by mouth daily.   diclofenac 50 MG EC tablet Commonly known as:  VOLTAREN Take 50 mg by mouth 2 (two) times daily.   eszopiclone 2 MG Tabs tablet Commonly known as:  LUNESTA   HYDROcodone-acetaminophen 5-325 MG tablet Commonly known as:  NORCO/VICODIN Take 1 tablet by mouth every 6 (six) hours as needed.   levothyroxine 50  MCG tablet Commonly known as:  SYNTHROID, LEVOTHROID Take 50 mcg by mouth daily.   metoprolol succinate 25 MG 24 hr tablet Commonly known as:  TOPROL-XL Take 25 mg by mouth daily.   MULTIVITAMIN PO Take 1 tablet by mouth daily.   olmesartan-hydrochlorothiazide 40-12.5 MG tablet Commonly known as:  BENICAR HCT Take 1 tablet by mouth daily.       Allergies  Allergen Reactions  . Betadine [Povidone Iodine]     blisters  . Latex Swelling    hives  . Zestril [Lisinopril]     cough    Review of systems negative except as noted in HPI / PMHx or noted below:  Review of Systems  Constitutional: Negative.   HENT: Negative.   Eyes: Negative.   Respiratory: Negative.   Cardiovascular: Negative.    Gastrointestinal: Negative.   Genitourinary: Negative.   Musculoskeletal: Negative.   Skin: Negative.   Neurological: Negative.   Endo/Heme/Allergies: Negative.   Psychiatric/Behavioral: Negative.     Family History  Problem Relation Age of Onset  . Heart disease Father   . Heart disease Maternal Grandfather   . Breast cancer Sister     Social History   Social History  . Marital status: Widowed    Spouse name: N/A  . Number of children: N/A  . Years of education: N/A   Occupational History  . retired    Social History Main Topics  . Smoking status: Former Smoker    Packs/day: 0.50    Years: 20.00    Types: Cigarettes    Quit date: 03/15/1964  . Smokeless tobacco: Never Used  . Alcohol use Yes     Comment: social  . Drug use: No  . Sexual activity: Not on file   Other Topics Concern  . Not on file   Social History Narrative  . No narrative on file    Environmental and Social history  Lives in a townhouse with a dry environment, no animals located inside the household, carpeting in the bedroom, plastic on the bed and pillow, and no smokers located inside the household  Objective:   Vitals:   12/18/15 0951  BP: (!) 150/80  Pulse: 76  Resp: (!) 22  Temp: 98.4 F (36.9 C)   Height: 4' 11.06" (150 cm) Weight: 197 lb 6.4 oz (89.5 kg)  Physical Exam  Constitutional: She is well-developed, well-nourished, and in no distress.  Raspy voice  HENT:  Head: Normocephalic. Head is without right periorbital erythema and without left periorbital erythema.  Right Ear: External ear normal.  Left Ear: External ear normal.  Nose: Nose normal. No mucosal edema or rhinorrhea.  Mouth/Throat: Uvula is midline, oropharynx is clear and moist and mucous membranes are normal. No oropharyngeal exudate.  Hearing aid bilaterally  Eyes: Conjunctivae and lids are normal. Pupils are equal, round, and reactive to light.  Neck: Trachea normal. No tracheal tenderness present. No  tracheal deviation present. No thyromegaly present.  Cardiovascular: Normal rate, regular rhythm, S1 normal, S2 normal and normal heart sounds.   No murmur heard. Pulmonary/Chest: Effort normal and breath sounds normal. No stridor. No tachypnea. No respiratory distress. She has no wheezes. She has no rales. She exhibits no tenderness.  Abdominal: Soft. She exhibits no distension and no mass. There is no hepatosplenomegaly. There is no tenderness. There is no rebound and no guarding.  Musculoskeletal: She exhibits no edema or tenderness.  Lymphadenopathy:       Head (right side): No tonsillar adenopathy present.  Head (left side): No tonsillar adenopathy present.    She has no cervical adenopathy.    She has no axillary adenopathy.  Neurological: She is alert. Gait normal.  Skin: No rash noted. She is not diaphoretic. No erythema. No pallor. Nails show no clubbing.  Psychiatric: Mood and affect normal.    Diagnostics: Allergy skin tests were not performed secondary to the recent administration of an antihistamine.   Assessment and Plan:    1. LPRD (laryngopharyngeal reflux disease)   2. Other allergic rhinitis     1. Treat reflux:   A. consolidate caffeine and chocolate consumption  B. start omeprazole 40 mg tablet in a.m.  C. start ranitidine 300 mg tablet in PM  2. May use nasal ipratropium 0.06% 2 sprays each nostril up to 3 times per day to try drainage  3. Return to clinic in 4 weeks or earlier if problem  I suspect that the bulk of [name]'s respiratory tract eructation and inflammation and mucus production is secondary to a reflux trigger and I will have her utilize therapy against reflux as noted above for 4 weeks and then make a decision about how to proceed pending her response. I did give her some nasal ipratropium to help with some upper airway inflammation in case this is also contributing to some of her drainage issue.  Jiles Prows, MD [facility]

## 2015-12-18 NOTE — Patient Instructions (Addendum)
  1. Treat reflux:   A. consolidate caffeine and chocolate consumption  B. start omeprazole 40 mg tablet in a.m.  C. start ranitidine 300 mg tablet in PM  2. May use nasal ipratropium 0.06% 2 sprays each nostril up to 3 times per day to try drainage  3. Return to clinic in 4 weeks or earlier if problem

## 2016-01-15 ENCOUNTER — Ambulatory Visit: Payer: Medicare Other | Admitting: Allergy and Immunology

## 2016-01-15 DIAGNOSIS — M19079 Primary osteoarthritis, unspecified ankle and foot: Secondary | ICD-10-CM

## 2016-01-15 HISTORY — DX: Primary osteoarthritis, unspecified ankle and foot: M19.079

## 2016-01-19 DIAGNOSIS — L909 Atrophic disorder of skin, unspecified: Secondary | ICD-10-CM

## 2016-01-19 HISTORY — DX: Atrophic disorder of skin, unspecified: L90.9

## 2016-01-21 ENCOUNTER — Ambulatory Visit (INDEPENDENT_AMBULATORY_CARE_PROVIDER_SITE_OTHER): Payer: Medicare Other | Admitting: Allergy and Immunology

## 2016-01-21 ENCOUNTER — Encounter: Payer: Self-pay | Admitting: Allergy and Immunology

## 2016-01-21 VITALS — BP 142/84 | HR 64 | Resp 22

## 2016-01-21 DIAGNOSIS — J3089 Other allergic rhinitis: Secondary | ICD-10-CM

## 2016-01-21 DIAGNOSIS — K219 Gastro-esophageal reflux disease without esophagitis: Secondary | ICD-10-CM

## 2016-01-21 MED ORDER — DEXLANSOPRAZOLE 60 MG PO CPDR: DELAYED_RELEASE_CAPSULE | ORAL | 5 refills | Status: DC

## 2016-01-21 NOTE — Patient Instructions (Signed)
  1. Treat reflux:   A. consolidate caffeine and chocolate consumption  B. Change omeprazole to Dexilant 60mg  one capsule in AM  C. Continue ranitidine 300 mg tablet in PM  2. May use nasal ipratropium 0.06% 2 sprays each nostril up to 3 times per day  3. Return to clinic in 4 weeks or earlier if problem

## 2016-01-21 NOTE — Progress Notes (Signed)
Follow-up Note  Referring Provider: [name], MD Primary Provider: Gilford Rile, MD Date of Office Visit: 01/21/2016  Subjective:   [name] (DOB: May 06, 1925) is a 82 y.o. female who returns to the [facility] on 01/21/2016 in re-evaluation of the following:  HPI: [name] returns to this clinic in reevaluation of her LPR and rhinitis. I last saw her in his clinic for initial evaluation on 18 December 2015.   Although her nasal issue has responded well to nasal ipratropium she still continues to have some issues with postnasal drip and intermittent hoarseness and coughing and occasional throat clearing and choking. She still continues to eat chocolate at least 3 times a week.    Medication List      ALPRAZolam 0.25 MG tablet Commonly known as:  XANAX Take 0.25 mg by mouth daily.   buPROPion 150 MG 12 hr tablet Commonly known as:  WELLBUTRIN SR Take 150 mg by mouth daily before breakfast.   citalopram 10 MG tablet Commonly known as:  CELEXA Take 10 mg by mouth daily.   diclofenac 50 MG EC tablet Commonly known as:  VOLTAREN Take 50 mg by mouth 2 (two) times daily.   eszopiclone 2 MG Tabs tablet Commonly known as:  LUNESTA   HYDROcodone-acetaminophen 5-325 MG tablet Commonly known as:  NORCO/VICODIN Take 1 tablet by mouth every 6 (six) hours as needed.   ipratropium 0.06 % nasal spray Commonly known as:  ATROVENT Place 2 sprays into both nostrils 3 (three) times daily.   levothyroxine 50 MCG tablet Commonly known as:  SYNTHROID, LEVOTHROID Take 50 mcg by mouth daily.   metoprolol succinate 25 MG 24 hr tablet Commonly known as:  TOPROL-XL Take 25 mg by mouth daily.   MULTIVITAMIN PO Take 1 tablet by mouth daily.   olmesartan-hydrochlorothiazide 40-12.5 MG tablet Commonly known as:  BENICAR HCT Take 1 tablet by mouth daily.   omeprazole 40 MG capsule Commonly known as:  PRILOSEC Take 1 capsule (40 mg total) by mouth daily.     ranitidine 300 MG tablet Commonly known as:  ZANTAC Take 1 tablet (300 mg total) by mouth at bedtime.       Past Medical History:  Diagnosis Date  . Abnormality of gait   . Acute sinusitis   . Anxiety   . Aphthous ulcer   . Breast cancer ([place])   . CAD (coronary artery disease)   . Chronic kidney disease, stage III (moderate)   . Chronic sinusitis   . Depressive disorder   . Edema   . HA (headache)   . Hyperlipemia   . Hypothyroid   . Insomnia   . Memory loss   . Osteoarthritis   . Pain in joint, site unspecified   . PNA (pneumonia)   . Reflux esophagitis    chronic    Past Surgical History:  Procedure Laterality Date  . ANGIOPLASTY    . BREAST LUMPECTOMY     x2  . CAROTID STENT    . GALLBLADDER SURGERY    . TOTAL ABDOMINAL HYSTERECTOMY      Allergies  Allergen Reactions  . Betadine [Povidone Iodine]     blisters  . Latex Swelling    hives  . Zestril [Lisinopril]     cough    Review of systems negative except as noted in HPI / PMHx or noted below:  Review of Systems  Constitutional: Negative.   HENT: Negative.   Eyes: Negative.   Respiratory:  Negative.   Cardiovascular: Negative.   Gastrointestinal: Negative.   Genitourinary: Negative.   Musculoskeletal: Negative.   Skin: Negative.   Neurological: Negative.   Endo/Heme/Allergies: Negative.   Psychiatric/Behavioral: Negative.      Objective:   Vitals:   01/21/16 1537  BP: (!) 142/84  Pulse: 64  Resp: (!) 22          Physical Exam  Constitutional: She is well-developed, well-nourished, and in no distress.  HENT:  Head: Normocephalic.  Right Ear: Tympanic membrane, external ear and ear canal normal.  Left Ear: Tympanic membrane, external ear and ear canal normal.  Nose: Nose normal. No mucosal edema or rhinorrhea.  Mouth/Throat: Uvula is midline, oropharynx is clear and moist and mucous membranes are normal. No oropharyngeal exudate.  Eyes: Conjunctivae are normal.  Neck: Trachea  normal. No tracheal tenderness present. No tracheal deviation present. No thyromegaly present.  Cardiovascular: Normal rate, regular rhythm, S1 normal, S2 normal and normal heart sounds.   No murmur heard. Pulmonary/Chest: Breath sounds normal. No stridor. No respiratory distress. She has no wheezes. She has no rales.  Musculoskeletal: She exhibits no edema.  Lymphadenopathy:       Head (right side): No tonsillar adenopathy present.       Head (left side): No tonsillar adenopathy present.    She has no cervical adenopathy.  Neurological: She is alert. Gait normal.  Skin: No rash noted. She is not diaphoretic. No erythema. Nails show no clubbing.  Psychiatric: Mood and affect normal.    Diagnostics: None   Assessment and Plan:   1. LPRD (laryngopharyngeal reflux disease)   2. Other allergic rhinitis     1. Treat reflux:   A. consolidate caffeine and chocolate consumption  B. Change omeprazole to Dexilant 60mg  one capsule in AM  C. Continue ranitidine 300 mg tablet in PM  2. May use nasal ipratropium 0.06% 2 sprays each nostril up to 3 times per day  3. Return to clinic in 4 weeks or earlier if problem  I'm going to have [name] treat her reflux a little more aggressively by eliminating all her chocolate consumption and having her use Dexilant and ranitidine combination. She can continue to use nasal ipratropium for her rhinitis. I will regroup with her in 4 weeks to make decision about further evaluation treatment pending her response.  Allena Katz, MD [facility]

## 2016-03-15 HISTORY — PX: SKIN CANCER EXCISION: SHX779

## 2016-03-19 DIAGNOSIS — Z853 Personal history of malignant neoplasm of breast: Secondary | ICD-10-CM

## 2016-03-19 DIAGNOSIS — Z79811 Long term (current) use of aromatase inhibitors: Secondary | ICD-10-CM | POA: Diagnosis not present

## 2016-03-19 DIAGNOSIS — C50912 Malignant neoplasm of unspecified site of left female breast: Secondary | ICD-10-CM | POA: Diagnosis not present

## 2016-06-16 DIAGNOSIS — R5383 Other fatigue: Secondary | ICD-10-CM

## 2016-06-16 DIAGNOSIS — C50912 Malignant neoplasm of unspecified site of left female breast: Secondary | ICD-10-CM | POA: Diagnosis not present

## 2016-06-16 DIAGNOSIS — Z86000 Personal history of in-situ neoplasm of breast: Secondary | ICD-10-CM | POA: Diagnosis not present

## 2016-06-16 DIAGNOSIS — M8580 Other specified disorders of bone density and structure, unspecified site: Secondary | ICD-10-CM | POA: Diagnosis not present

## 2016-06-16 DIAGNOSIS — Z853 Personal history of malignant neoplasm of breast: Secondary | ICD-10-CM

## 2016-11-29 DIAGNOSIS — Z923 Personal history of irradiation: Secondary | ICD-10-CM | POA: Diagnosis not present

## 2016-11-29 DIAGNOSIS — C50912 Malignant neoplasm of unspecified site of left female breast: Secondary | ICD-10-CM | POA: Diagnosis not present

## 2016-11-29 DIAGNOSIS — D649 Anemia, unspecified: Secondary | ICD-10-CM | POA: Diagnosis not present

## 2016-11-29 DIAGNOSIS — R1906 Epigastric swelling, mass or lump: Secondary | ICD-10-CM | POA: Diagnosis not present

## 2016-11-29 DIAGNOSIS — Z853 Personal history of malignant neoplasm of breast: Secondary | ICD-10-CM | POA: Diagnosis not present

## 2016-11-29 DIAGNOSIS — N189 Chronic kidney disease, unspecified: Secondary | ICD-10-CM | POA: Diagnosis not present

## 2016-11-29 DIAGNOSIS — M8580 Other specified disorders of bone density and structure, unspecified site: Secondary | ICD-10-CM | POA: Diagnosis not present

## 2016-11-29 DIAGNOSIS — Z86000 Personal history of in-situ neoplasm of breast: Secondary | ICD-10-CM | POA: Diagnosis not present

## 2016-11-30 ENCOUNTER — Ambulatory Visit: Payer: Medicare Other | Admitting: Pediatrics

## 2016-12-27 ENCOUNTER — Encounter: Payer: Self-pay | Admitting: Pediatrics

## 2016-12-27 ENCOUNTER — Ambulatory Visit (INDEPENDENT_AMBULATORY_CARE_PROVIDER_SITE_OTHER): Payer: Medicare Other | Admitting: Pediatrics

## 2016-12-27 VITALS — BP 122/64 | HR 82 | Temp 98.2°F | Resp 20 | Ht 60.5 in | Wt 191.0 lb

## 2016-12-27 DIAGNOSIS — Z955 Presence of coronary angioplasty implant and graft: Secondary | ICD-10-CM

## 2016-12-27 DIAGNOSIS — Z9861 Coronary angioplasty status: Secondary | ICD-10-CM | POA: Diagnosis not present

## 2016-12-27 DIAGNOSIS — Z79899 Other long term (current) drug therapy: Secondary | ICD-10-CM | POA: Diagnosis not present

## 2016-12-27 DIAGNOSIS — J3089 Other allergic rhinitis: Secondary | ICD-10-CM | POA: Insufficient documentation

## 2016-12-27 DIAGNOSIS — K219 Gastro-esophageal reflux disease without esophagitis: Secondary | ICD-10-CM

## 2016-12-27 HISTORY — DX: Other allergic rhinitis: J30.89

## 2016-12-27 HISTORY — DX: Gastro-esophageal reflux disease without esophagitis: K21.9

## 2016-12-27 MED ORDER — FLUTICASONE PROPIONATE 50 MCG/ACT NA SUSP: 1.0000 | Freq: Two times a day (BID) | NASAL | 5 refills | Status: DC

## 2016-12-27 MED ORDER — IPRATROPIUM BROMIDE 0.06 % NA SOLN: 2.0000 | Freq: Three times a day (TID) | NASAL | 5 refills | Status: DC | PRN

## 2016-12-27 NOTE — Progress Notes (Addendum)
[place] [id number] Dept: [id number]  FOLLOW UP NOTE  Patient ID: [name], female    DOB: Aug 26, 1925  Age: 83 y.o. MRN: [id number] Date of Office Visit: 12/27/2016  Assessment  Chief Complaint: Sinus Problem  HPI [name] presents for follow-up of sinus problems. She is using nasal saline irrigations twice a day. She is not using any nasal sprays. She has developed mild hoarseness. She clears her throat a great deal but different proton pump inhibitors and ranitidine have  not helped her clearing of her throat at  all. She does not have glaucoma. She saw ba  gastroenterologist. She has never had  an upper endoscopy. She does not want proton pump inhibitors are H2-blockers. She could be having gastroesophageal reflux without heartburn   Drug Allergies:  Allergies  Allergen Reactions  . Betadine [Povidone Iodine]     blisters  . Latex Swelling    hives  . Zestril [Lisinopril]     cough    Physical Exam: BP 122/64   Pulse 82   Temp 98.2 F (36.8 C) (Oral)   Resp 20   Ht 5' 0.5" (1.537 m)   Wt 191 lb (86.6 kg)   SpO2 94%   BMI 36.69 kg/m    Physical Exam  Constitutional: She is oriented to person, place, and time. She appears well-developed and well-nourished.  HENT:  Eyes normal. Ears normal. Nose mild swelling of nasal turbinates. Pharynx normal.  Neck: Neck supple.  Cardiovascular:  S1 and S2 normal. She had a grade 2/6 systolic ejection murmur best heard in the aortic valve area  Pulmonary/Chest:  Clear to percussion and auscultation  Lymphadenopathy:    She has no cervical adenopathy.  Neurological: She is alert and oriented to person, place, and time.  Psychiatric: She has a normal mood and affect. Her behavior is normal. Judgment and thought content normal.  Vitals reviewed.   Diagnostics:  none  Assessment and Plan: 1. Other allergic rhinitis   2. Gastroesophageal reflux disease without esophagitis   3. Current use  of beta blocker   4. History of heart artery stent     Meds ordered this encounter  Medications  . fluticasone (FLONASE) 50 MCG/ACT nasal spray    Sig: Place 1 spray into both nostrils 2 (two) times daily.    Dispense:  16 g    Refill:  5  . ipratropium (ATROVENT) 0.06 % nasal spray    Sig: Place 2 sprays into both nostrils 3 (three) times daily as needed (for runny nose).    Dispense:  15 mL    Refill:  5    Patient Instructions  Nasal saline irrigations twice a day followed by fluticasone 1 spray per nostril twice a day Ipratropium 0.06%- 2 sprays per nostril 3 times a day if needed for runny nose . May use 20 minutes before eating Benadryl 25 mg-take 1 capsule at night for drainage Call me if you're not doing well on this treatment She should see an ENT specialist  to look at her vocal cords because of hoarseness She should have an evaluation for gastroesophageal reflux Call me she's not doing better on this treatment plan   Return in about 1 year (around 12/27/2017).    Thank you for the opportunity to care for this patient.  Please do not hesitate to contact me with questions.  Penne Lash, M.D.  [facility] [place],  [place] [address] [phone]

## 2016-12-27 NOTE — Patient Instructions (Addendum)
Nasal saline irrigations twice a day followed by fluticasone 1 spray per nostril twice a day Ipratropium 0.06%- 2 sprays per nostril 3 times a day if needed for runny nose . May use 20 minutes before eating Benadryl 25 mg-take 1 capsule at night for drainage Call me if you're not doing well on this treatment She should see an ENT specialist  to look at her vocal cords because of hoarseness She should have an evaluation for gastroesophageal reflux Call me she's not doing better on this treatment plan

## 2016-12-29 DIAGNOSIS — N189 Chronic kidney disease, unspecified: Secondary | ICD-10-CM | POA: Diagnosis not present

## 2016-12-29 DIAGNOSIS — Z853 Personal history of malignant neoplasm of breast: Secondary | ICD-10-CM | POA: Diagnosis not present

## 2016-12-29 DIAGNOSIS — R1906 Epigastric swelling, mass or lump: Secondary | ICD-10-CM | POA: Diagnosis not present

## 2016-12-29 DIAGNOSIS — D649 Anemia, unspecified: Secondary | ICD-10-CM | POA: Diagnosis not present

## 2016-12-29 DIAGNOSIS — C50912 Malignant neoplasm of unspecified site of left female breast: Secondary | ICD-10-CM | POA: Diagnosis not present

## 2016-12-29 DIAGNOSIS — Z170 Estrogen receptor positive status [ER+]: Secondary | ICD-10-CM | POA: Diagnosis not present

## 2016-12-29 DIAGNOSIS — Z86000 Personal history of in-situ neoplasm of breast: Secondary | ICD-10-CM | POA: Diagnosis not present

## 2016-12-29 DIAGNOSIS — Z923 Personal history of irradiation: Secondary | ICD-10-CM | POA: Diagnosis not present

## 2016-12-29 DIAGNOSIS — M8580 Other specified disorders of bone density and structure, unspecified site: Secondary | ICD-10-CM | POA: Diagnosis not present

## 2017-03-30 DIAGNOSIS — C50912 Malignant neoplasm of unspecified site of left female breast: Secondary | ICD-10-CM | POA: Diagnosis not present

## 2017-03-30 DIAGNOSIS — Z923 Personal history of irradiation: Secondary | ICD-10-CM | POA: Diagnosis not present

## 2017-03-30 DIAGNOSIS — Z86000 Personal history of in-situ neoplasm of breast: Secondary | ICD-10-CM | POA: Diagnosis not present

## 2017-03-30 DIAGNOSIS — M8580 Other specified disorders of bone density and structure, unspecified site: Secondary | ICD-10-CM

## 2017-03-30 DIAGNOSIS — N189 Chronic kidney disease, unspecified: Secondary | ICD-10-CM | POA: Diagnosis not present

## 2017-03-30 DIAGNOSIS — R1906 Epigastric swelling, mass or lump: Secondary | ICD-10-CM | POA: Diagnosis not present

## 2017-03-30 DIAGNOSIS — D649 Anemia, unspecified: Secondary | ICD-10-CM | POA: Diagnosis not present

## 2017-03-30 DIAGNOSIS — Z79818 Long term (current) use of other agents affecting estrogen receptors and estrogen levels: Secondary | ICD-10-CM | POA: Diagnosis not present

## 2017-05-04 DIAGNOSIS — C50912 Malignant neoplasm of unspecified site of left female breast: Secondary | ICD-10-CM | POA: Diagnosis not present

## 2017-05-04 DIAGNOSIS — M8580 Other specified disorders of bone density and structure, unspecified site: Secondary | ICD-10-CM

## 2017-05-04 DIAGNOSIS — R1906 Epigastric swelling, mass or lump: Secondary | ICD-10-CM

## 2017-05-04 DIAGNOSIS — D649 Anemia, unspecified: Secondary | ICD-10-CM | POA: Diagnosis not present

## 2017-05-04 DIAGNOSIS — N189 Chronic kidney disease, unspecified: Secondary | ICD-10-CM

## 2017-05-04 DIAGNOSIS — C773 Secondary and unspecified malignant neoplasm of axilla and upper limb lymph nodes: Secondary | ICD-10-CM

## 2017-05-04 DIAGNOSIS — Z853 Personal history of malignant neoplasm of breast: Secondary | ICD-10-CM

## 2017-05-04 DIAGNOSIS — Z923 Personal history of irradiation: Secondary | ICD-10-CM

## 2017-05-04 DIAGNOSIS — Z86000 Personal history of in-situ neoplasm of breast: Secondary | ICD-10-CM

## 2017-05-04 DIAGNOSIS — Z170 Estrogen receptor positive status [ER+]: Secondary | ICD-10-CM

## 2017-05-09 ENCOUNTER — Ambulatory Visit: Payer: Medicare Other | Admitting: Interventional Cardiology

## 2017-05-18 NOTE — Progress Notes (Signed)
Cardiology Office Note    Date:  05/19/2017   ID:  [name], DOB 24-Oct-1925, MRN [id number]  PCP:  [name], MD  Cardiologist: [name], MD   Chief Complaint  Patient presents with  . Shortness of Breath  . Coronary Artery Disease    History of Present Illness:  [name] is a 84 y.o. female coronary artery disease with chronic total occlusion of the right coronary treated with PCI and stenting using DES in 2011, mild to moderate aortic stenosis, hyperlipidemia, hypertension,hypothyroidism, chronic kidney disease stage III.   She has not seen me in several years.  She had a chronic total occlusion of the right coronary treated with a drug-eluting stent in the ostial position in 2011.  Her presentation at that time was exertional fatigue and dyspnea.  She comes in now stating that she feels in many ways very similar to 2011.  She is obviously altered now.  She does not ambulate as well.  She has not been as active as before.  She is bothered mostly when she tries to do moderate activity and feels dyspneic.  She also does not sleep well.  This is because of nasal congestion and drainage.  She denies orthopnea.  No lower extremity swelling has been noted.  She denies syncope and angina.  No palpitations or racing heart has been noted.   Past Medical History:  Diagnosis Date  . Abnormality of gait   . Acute sinusitis   . Anxiety   . Aphthous ulcer   . Breast cancer ([place])   . CAD (coronary artery disease)   . Chronic kidney disease, stage III (moderate) (HCC)   . Chronic sinusitis   . Depressive disorder   . Edema   . HA (headache)   . Hyperlipemia   . Hypothyroid   . Insomnia   . Memory loss   . Osteoarthritis   . Pain in joint, site unspecified   . PNA (pneumonia)   . Reflux esophagitis    chronic    Past Surgical History:  Procedure Laterality Date  . ANGIOPLASTY    . BREAST LUMPECTOMY     x2  . CAROTID STENT    . GALLBLADDER SURGERY    .  SKIN CANCER EXCISION  2018   right nostril   . TOTAL ABDOMINAL HYSTERECTOMY      Current Medications: Outpatient Medications Prior to Visit  Medication Sig Dispense Refill  . ALPRAZolam (XANAX) 0.25 MG tablet Take 0.25 mg by mouth daily.     . Ascorbic Acid (VITAMIN C PO) Take 1 tablet by mouth daily.     Marland Kitchen buPROPion (WELLBUTRIN SR) 150 MG 12 hr tablet Take 150 mg by mouth daily before breakfast.     . citalopram (CELEXA) 10 MG tablet Take 10 mg by mouth daily.     . Cyanocobalamin (VITAMIN B 12 PO) Take 1 tablet by mouth daily.     . diclofenac (VOLTAREN) 50 MG EC tablet Take 50 mg by mouth 2 (two) times daily.    . eszopiclone (LUNESTA) 2 MG TABS tablet Take 2 mg by mouth at bedtime.     Marland Kitchen HYDROcodone-acetaminophen (NORCO/VICODIN) 5-325 MG tablet Take 1-2 tablets by mouth every 6 (six) hours as needed for moderate pain.    . Iron Combinations (IRON COMPLEX PO) Take 1 tablet by mouth daily.     Marland Kitchen levothyroxine (SYNTHROID, LEVOTHROID) 50 MCG tablet TAKE ONE (1) TABLET BY MOUTH ONCE DAILY    .  metoprolol succinate (TOPROL-XL) 25 MG 24 hr tablet TAKE ONE (1) TABLET BY MOUTH ONCE DAILY    . Multiple Vitamins-Minerals (MULTIVITAMIN PO) Take 1 tablet by mouth daily.    Marland Kitchen olmesartan-hydrochlorothiazide (BENICAR HCT) 40-12.5 MG tablet Take 1 tablet by mouth daily.    . fluticasone (FLONASE) 50 MCG/ACT nasal spray Place 1 spray into both nostrils 2 (two) times daily. (Patient not taking: Reported on 05/19/2017) 16 g 5  . HYDROcodone-acetaminophen (NORCO/VICODIN) 5-325 MG tablet TAKE 1 TO 2 TABLETS BY MOUTH EVERY 6 HOURS AS NEEDED    . ipratropium (ATROVENT) 0.06 % nasal spray Place 2 sprays into both nostrils 3 (three) times daily as needed (for runny nose). (Patient not taking: Reported on 05/19/2017) 15 mL 5  . olmesartan-hydrochlorothiazide (BENICAR HCT) 40-12.5 MG tablet TAKE ONE (1) TABLET ONCE DAILY     No facility-administered medications prior to visit.      Allergies:   Betadine [povidone  iodine]; Latex; and Zestril [lisinopril]   Social History   Socioeconomic History  . Marital status: Widowed    Spouse name: None  . Number of children: None  . Years of education: None  . Highest education level: None  Social Needs  . Financial resource strain: None  . Food insecurity - worry: None  . Food insecurity - inability: None  . Transportation needs - medical: None  . Transportation needs - non-medical: None  Occupational History  . Occupation: retired  Tobacco Use  . Smoking status: Former Smoker    Packs/day: 0.50    Years: 20.00    Pack years: 10.00    Types: Cigarettes    Last attempt to quit: 03/15/1964    Years since quitting: 53.2  . Smokeless tobacco: Never Used  Substance and Sexual Activity  . Alcohol use: Yes    Comment: social  . Drug use: No  . Sexual activity: None  Other Topics Concern  . None  Social History Narrative  . None     Family History:  The patient's family history includes Breast cancer in her sister; Heart disease in her father and maternal grandfather.   ROS:   Please see the history of present illness.    Breast cancer x3 twice in the left breast and once in the right breast.  Back pain, muscle pain, vision disturbance.  Does not sleep well.  She feels this is mostly because of upper respiratory congestion.  She awakens frequently from sleep.  Depression, blood in stool, diarrhea, wheezing, vision disturbance, hearing loss, and exertional fatigue All other systems reviewed and are negative. `  PHYSICAL EXAM:   VS:  BP (!) 180/96   Pulse 61   Ht 4' 11.5" (1.511 m)   Wt 190 lb 1.9 oz (86.2 kg)   BMI 37.76 kg/m    GEN: Well nourished, well developed, in no acute distress  HEENT: normal  Neck: no JVD, carotid bruits, or masses Cardiac: 3/6 crescendo decrescendo right upper sternal border and left mid to lower sternal border systolic murmur compatible with aortic stenosis.  No diastolic murmurs heard.  RRR; no rubs, or  gallops,no edema  Respiratory:  clear to auscultation bilaterally, normal work of breathing GI: soft, nontender, nondistended, + BS MS: no deformity or atrophy  Skin: warm and dry, no rash Neuro:  Alert and Oriented x 3, Strength and sensation are intact Psych: euthymic mood, full affect  Wt Readings from Last 3 Encounters:  05/19/17 190 lb 1.9 oz (86.2 kg)  12/27/16 191  lb (86.6 kg)  12/18/15 197 lb 6.4 oz (89.5 kg)      Studies/Labs Reviewed:   EKG:  EKG normal sinus rhythm, first-degree AV block, incomplete right bundle, and when compared to November 2015 no significant change has occurred.  Recent Labs: No results found for requested labs within last 8760 hours.   Lipid Panel No results found for: CHOL, TRIG, HDL, CHOLHDL, VLDL, LDLCALC, LDLDIRECT  Additional studies/ records that were reviewed today include:  2D Doppler echocardiogram January 2016: Study Conclusions  - Left ventricle: The cavity size was normal. Wall thickness was   increased in a pattern of mild LVH. Systolic function was normal.   The estimated ejection fraction was in the range of 55% to 60%.   Doppler parameters are consistent with elevated ventricular   end-diastolic filling pressure. - Aortic valve: There was mild to moderate stenosis. There was mild   regurgitation. - Mitral valve: Calcified annulus. Mildly thickened leaflets .   There was mild regurgitation. - Left atrium: The atrium was mildly dilated. - Atrial septum: No defect or patent foramen ovale was identified. - Pulmonary arteries: PA peak pressure: 53 mm Hg (S).     ASSESSMENT:    1. Nonrheumatic aortic valve stenosis   2. Atherosclerosis of native coronary artery of native heart without angina pectoris   3. Essential hypertension, benign   4. SOB (shortness of breath)      PLAN:  In order of problems listed above:  1. Probable progression of aortic stenosis over the past 3 years.  Could account for the patient's current  complaints.  We will also check a BNP to determine if there is pulmonary congestion. 2. She had chronic total occlusion of the right coronary and underwent successful intervention in 2011.  Her presenting complaint at that time was exertional dyspnea and fatigue.  Never had chest pain.  This is in differential for her presenting symptoms at this time. 3. Blood pressures not well controlled.  Target blood pressure should be 130/80 mmHg.  The blood pressure was repeated after she settled down in the room and it was 142/88 mmHg.  Medication regimen needed may need to be further adjusted but will wait for echo results before to aggressively altering the medication regimen.  The current therapy is Benicar HCT 40/12.5 mg and Toprol-XL 25 mg once a day. 4. Shortness of breath and fatigue could be multifactorial.  BNP will be done.  Echo will be helpful to determine if there is significant change in LV function antegrade the severity of aortic stenosis.  At her age it may not be much that we can do to help her feel better but we will see.  She has mentioned that she is not willing to have surgery.  He may not be willing to have much of any kind of invasive procedures.  We did discuss the potential for percutaneous valve management of aortic stenosis.    Medication Adjustments/Labs and Tests Ordered: Current medicines are reviewed at length with the patient today.  Concerns regarding medicines are outlined above.  Medication changes, Labs and Tests ordered today are listed in the Patient Instructions below. Patient Instructions  Medication Instructions:  Your physician recommends that you continue on your current medications as directed. Please refer to the Current Medication list given to you today.  Labwork: Pro BNP and CBC today  Testing/Procedures: Your physician has requested that you have an echocardiogram. Echocardiography is a painless test that uses sound waves to create images  of your heart. It  provides your doctor with information about the size and shape of your heart and how well your heart's chambers and valves are working. This procedure takes approximately one hour. There are no restrictions for this procedure.   Follow-Up: Your physician recommends that you schedule a follow-up appointment as needed with Dr. Tamala Julian.    Any Other Special Instructions Will Be Listed Below (If Applicable).     If you need a refill on your cardiac medications before your next appointment, please call your pharmacy.      Signed, [name], MD  05/19/2017 3:15 PM    [facility] [address] [place], [place], [place]  [address] Phone: [phone]; Fax: [phone]

## 2017-05-19 ENCOUNTER — Encounter: Payer: Self-pay | Admitting: Interventional Cardiology

## 2017-05-19 ENCOUNTER — Ambulatory Visit (INDEPENDENT_AMBULATORY_CARE_PROVIDER_SITE_OTHER): Payer: Medicare Other | Admitting: Interventional Cardiology

## 2017-05-19 VITALS — BP 180/96 | HR 61 | Ht 59.5 in | Wt 190.1 lb

## 2017-05-19 DIAGNOSIS — R0602 Shortness of breath: Secondary | ICD-10-CM | POA: Diagnosis not present

## 2017-05-19 DIAGNOSIS — I350 Nonrheumatic aortic (valve) stenosis: Secondary | ICD-10-CM

## 2017-05-19 DIAGNOSIS — I10 Essential (primary) hypertension: Secondary | ICD-10-CM | POA: Diagnosis not present

## 2017-05-19 DIAGNOSIS — I2510 Atherosclerotic heart disease of native coronary artery without angina pectoris: Secondary | ICD-10-CM

## 2017-05-19 NOTE — Patient Instructions (Signed)
Medication Instructions:  Your physician recommends that you continue on your current medications as directed. Please refer to the Current Medication list given to you today.  Labwork: Pro BNP and CBC today  Testing/Procedures: Your physician has requested that you have an echocardiogram. Echocardiography is a painless test that uses sound waves to create images of your heart. It provides your doctor with information about the size and shape of your heart and how well your heart's chambers and valves are working. This procedure takes approximately one hour. There are no restrictions for this procedure.   Follow-Up: Your physician recommends that you schedule a follow-up appointment as needed with Dr. Tamala Julian.    Any Other Special Instructions Will Be Listed Below (If Applicable).     If you need a refill on your cardiac medications before your next appointment, please call your pharmacy.

## 2017-05-20 LAB — CBC
HEMATOCRIT: 33.2 % — AB (ref 34.0–46.6)
Hemoglobin: 11.1 g/dL (ref 11.1–15.9)
MCH: 30.2 pg (ref 26.6–33.0)
MCHC: 33.4 g/dL (ref 31.5–35.7)
MCV: 90 fL (ref 79–97)
Platelets: 286 10*3/uL (ref 150–379)
RBC: 3.68 x10E6/uL — ABNORMAL LOW (ref 3.77–5.28)
RDW: 13 % (ref 12.3–15.4)
WBC: 6 10*3/uL (ref 3.4–10.8)

## 2017-05-20 LAB — PRO B NATRIURETIC PEPTIDE: NT-Pro BNP: 594 pg/mL (ref 0–738)

## 2017-06-01 ENCOUNTER — Other Ambulatory Visit (HOSPITAL_COMMUNITY): Payer: Medicare Other

## 2017-06-08 ENCOUNTER — Ambulatory Visit (HOSPITAL_COMMUNITY): Payer: Medicare Other | Attending: Cardiology

## 2017-06-08 ENCOUNTER — Other Ambulatory Visit: Payer: Self-pay

## 2017-06-08 DIAGNOSIS — I350 Nonrheumatic aortic (valve) stenosis: Secondary | ICD-10-CM | POA: Diagnosis present

## 2017-06-08 DIAGNOSIS — I083 Combined rheumatic disorders of mitral, aortic and tricuspid valves: Secondary | ICD-10-CM | POA: Insufficient documentation

## 2017-06-08 DIAGNOSIS — I5030 Unspecified diastolic (congestive) heart failure: Secondary | ICD-10-CM | POA: Diagnosis not present

## 2017-06-08 DIAGNOSIS — I420 Dilated cardiomyopathy: Secondary | ICD-10-CM | POA: Diagnosis not present

## 2017-06-16 ENCOUNTER — Encounter: Payer: Self-pay | Admitting: Interventional Cardiology

## 2017-06-16 ENCOUNTER — Ambulatory Visit (INDEPENDENT_AMBULATORY_CARE_PROVIDER_SITE_OTHER): Payer: Medicare Other | Admitting: Interventional Cardiology

## 2017-06-16 VITALS — BP 150/80 | HR 79 | Ht 59.0 in | Wt 190.6 lb

## 2017-06-16 DIAGNOSIS — R0609 Other forms of dyspnea: Secondary | ICD-10-CM

## 2017-06-16 DIAGNOSIS — I350 Nonrheumatic aortic (valve) stenosis: Secondary | ICD-10-CM | POA: Diagnosis not present

## 2017-06-16 DIAGNOSIS — I2510 Atherosclerotic heart disease of native coronary artery without angina pectoris: Secondary | ICD-10-CM | POA: Diagnosis not present

## 2017-06-16 DIAGNOSIS — I25119 Atherosclerotic heart disease of native coronary artery with unspecified angina pectoris: Secondary | ICD-10-CM | POA: Diagnosis not present

## 2017-06-16 DIAGNOSIS — I10 Essential (primary) hypertension: Secondary | ICD-10-CM

## 2017-06-16 NOTE — Progress Notes (Signed)
Cardiology Office Note    Date:  06/16/2017   ID:  HOLY BATTENFIELD, DOB 24-Apr-1925, MRN [id number]  PCP:  [name], MD  Cardiologist: [name], MD   No chief complaint on file.   History of Present Illness:  [name] is a 84 y.o. female coronary artery disease with prior ostial right coronary stent, moderate to severe aortic stenosis (possible stage D 3-low flow preserved LV systolic function), hyperlipidemia, hypertension,hypothyroidism, and chronic kidney disease stage III.   The patient has an upcoming birthday this Sunday when she will be 84 years of age.  She has had progressive reduction in exertional tolerance related to dyspnea and extreme fatigue.  She has previously been able to do stationary bicycling but feels dizzy and extremely fatigued and has stopped the activity.  She has history of coronary disease with prior ostial RCA stent placed several years ago.  Some of her symptoms are similar to pre-PCI.  She has a significant systolic murmur compatible with aortic stenosis.  Recent echocardiogram measurements suggested moderate to severe aortic stenosis.  Noted to have small LV cavity size and preserved LV systolic function with EF 60%.  She denies orthopnea, PND, edema, and syncope.  She has marked limitations in physical activity due to dyspnea.  Past Medical History:  Diagnosis Date  . Abnormality of gait   . Acute sinusitis   . Anxiety   . Aphthous ulcer   . Breast cancer ([place])   . CAD (coronary artery disease)   . Chronic kidney disease, stage III (moderate) ([facility])   . Chronic sinusitis   . Depressive disorder   . Edema   . HA (headache)   . Hyperlipemia   . Hypothyroid   . Insomnia   . Memory loss   . Osteoarthritis   . Pain in joint, site unspecified   . PNA (pneumonia)   . Reflux esophagitis    chronic    Past Surgical History:  Procedure Laterality Date  . ANGIOPLASTY    . BREAST LUMPECTOMY     x2  . CAROTID STENT    .  GALLBLADDER SURGERY    . SKIN CANCER EXCISION  2018   right nostril   . TOTAL ABDOMINAL HYSTERECTOMY      Current Medications: Outpatient Medications Prior to Visit  Medication Sig Dispense Refill  . ALPRAZolam (XANAX) 0.25 MG tablet Take 0.25 mg by mouth daily.     . Ascorbic Acid (VITAMIN C PO) Take 1 tablet by mouth daily.     Marland Kitchen buPROPion (WELLBUTRIN SR) 150 MG 12 hr tablet Take 150 mg by mouth daily before breakfast.     . citalopram (CELEXA) 10 MG tablet Take 10 mg by mouth daily.     . Cyanocobalamin (VITAMIN B 12 PO) Take 1 tablet by mouth daily.     . diclofenac (VOLTAREN) 50 MG EC tablet Take 50 mg by mouth 2 (two) times daily.    . eszopiclone (LUNESTA) 2 MG TABS tablet Take 2 mg by mouth at bedtime.     Marland Kitchen HYDROcodone-acetaminophen (NORCO/VICODIN) 5-325 MG tablet Take 1-2 tablets by mouth every 6 (six) hours as needed for moderate pain.    . Iron Combinations (IRON COMPLEX PO) Take 1 tablet by mouth daily.     Marland Kitchen levothyroxine (SYNTHROID, LEVOTHROID) 50 MCG tablet TAKE ONE (1) TABLET BY MOUTH ONCE DAILY    . metoprolol succinate (TOPROL-XL) 25 MG 24 hr tablet TAKE ONE (1) TABLET BY MOUTH  ONCE DAILY    . Multiple Vitamins-Minerals (MULTIVITAMIN PO) Take 1 tablet by mouth daily.    Marland Kitchen olmesartan-hydrochlorothiazide (BENICAR HCT) 40-12.5 MG tablet Take 1 tablet by mouth daily.     No facility-administered medications prior to visit.      Allergies:   Betadine [povidone iodine]; Latex; and Zestril [lisinopril]   Social History   Socioeconomic History  . Marital status: Widowed    Spouse name: Not on file  . Number of children: Not on file  . Years of education: Not on file  . Highest education level: Not on file  Occupational History  . 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 Financial resource strain: Not on file  . Food insecurity:    Worry: Not on file    Inability: Not on file  . Transportation needs:    Medical: Not on file    Non-medical: Not on file  Tobacco Use    . Smoking status: Former Smoker    Packs/day: 0.50    Years: 20.00    Pack years: 10.00    Types: Cigarettes    Last attempt to quit: 03/15/1964    Years since quitting: 53.2  . Smokeless tobacco: Never Used  Substance and Sexual Activity  . Alcohol use: Yes    Comment: social  . Drug use: No  . Sexual activity: Not on file  Lifestyle  . Physical activity:    Days per week: Not on file    Minutes per session: Not on file  . Stress: Not on file  Relationships  . Social connections:    Talks on phone: Not on file    Gets together: Not on file    Attends religious service: Not on file    Active member of club or organization: Not on file    Attends meetings of clubs or organizations: Not on file    Relationship status: Not on file  Other Topics Concern  . Not on file  Social History Narrative  . Not on file     Family History:  The patient's family history includes Breast cancer in her sister; Heart disease in her father and maternal grandfather.   ROS:   Please see the history of present illness.    CAD, decreased memory, difficulty with balance, and difficulty with sinus congestion All other systems reviewed and are negative.   PHYSICAL EXAM:   VS:  There were no vitals taken for this visit.   GEN: Well nourished, well developed, in no acute distress  HEENT: normal  Neck: no JVD, carotid bruits, or masses Cardiac: RRR; 3/6 SEM c/w AS. No  rubs, or gallops,no edema.  Respiratory:  clear to auscultation bilaterally, normal work of breathing GI: soft, nontender, nondistended, + BS MS: no deformity or atrophy  Skin: warm and dry, no rash Neuro:  Alert and Oriented x 3, Strength and sensation are intact Psych: euthymic mood, full affect  Wt Readings from Last 3 Encounters:  05/19/17 190 lb 1.9 oz (86.2 kg)  12/27/16 191 lb (86.6 kg)  12/18/15 197 lb 6.4 oz (89.5 kg)      Studies/Labs Reviewed:   EKG:  EKG  Not repeated.  Recent Labs: 05/19/2017: Hemoglobin  11.1; NT-Pro BNP 594; Platelets 286   Lipid Panel No results found for: CHOL, TRIG, HDL, CHOLHDL, VLDL, LDLCALC, LDLDIRECT  Additional studies/ records that were reviewed today include:  2D Doppler echocardiogram 06/08/2016: Study Conclusions   - Left ventricle: The cavity size was normal. Wall  thickness was   increased in a pattern of mild LVH. There was moderate focal   basal hypertrophy of the septum. Systolic function was normal.   The estimated ejection fraction was in the range of 60% to 65%.   Doppler parameters are consistent with abnormal left ventricular   relaxation (grade 1 diastolic dysfunction). The E/e&' ratio is   >15, suggesting elevated LV filling pressure. - Aortic valve: Moderate to severe aortic stenosis. Trivial   regurgitation. Mean gradient (S): 27 mm Hg. Peak gradient (S): 52   mm Hg. Valve area (VTI): 1.03 cm^2. Valve area (Vmax): 1.05 cm^2.   Valve area (Vmean): 1.02 cm^2. - Mitral valve: Calcified annulus. Mildly thickened leaflets .   There was mild regurgitation. - Right atrium: The atrium was mildly dilated. - Tricuspid valve: There was mild regurgitation. - Pulmonary arteries: PA peak pressure: 49 mm Hg (S). - Inferior vena cava: The vessel was normal in size. The   respirophasic diameter changes were in the normal range (= 50%),   consistent with normal central venous pressure.   Impressions:   - Compared to a prior study in 2016, the LVEF is higher at 60-65%.   There is now moderate to severe aortic stenosis with mean   gradient of 27 mmHg - AVA around 1.0-1.1 cm2.    ASSESSMENT:    1. Dyspnea on exertion   2. Nonrheumatic aortic valve stenosis   3. Essential hypertension, benign   4. Coronary artery disease involving native coronary artery of native heart with angina pectoris ([place])      PLAN:  In order of problems listed above:  1. Physical deconditioning, age, coronary disease, and diastolic heart failure associated with aortic  stenosis.  Difficult to tease out but there has clearly been progression in severity of her aortic valve process. 2. Possible Stage D 3 severe aortic stenosis with small LV cavity, preserved LV systolic function, moderate to high transvalvular gradient however the stroke volume index is higher than expected at 40 mL/m square and should ideally be less than 35.  Prior echoes have demonstrated a stroke volume index less than 35. 3. Systolic blood pressures 643 based on today's evaluation which goes against critical aortic stenosis.  No change in current medical regimen.  She will be referred to the valve clinic for an opinion concerning the aortic valve and whether it would be appropriate to pursue further evaluation to consider trans-aortic valve replacement given her limitations due to dyspnea.  I have chosen against performing left and right heart cath with coronary angiogram until getting an opinion from the valve clinic.  Pilar Plate discussion with the patient and family concerning her overall situation.  If it is reasonable to proceed we can get her set up for left and right heart cath with coronary and JL.  Medication Adjustments/Labs and Tests Ordered: Current medicines are reviewed at length with the patient today.  Concerns regarding medicines are outlined above.  Medication changes, Labs and Tests ordered today are listed in the Patient Instructions below. There are no Patient Instructions on file for this visit.   Signed, [name], MD  06/16/2017 8:19 AM    [facility] [place], [place], [place]  [address] Phone: [phone]; Fax: [phone]

## 2017-06-16 NOTE — Patient Instructions (Signed)
Medication Instructions:  Your physician recommends that you continue on your current medications as directed. Please refer to the Current Medication list given to you today.  Labwork: None  Testing/Procedures: None  Follow-Up: You have been referred to Dr. Burt Knack or Dr. Angelena Form for TAVR consideration.   Any Other Special Instructions Will Be Listed Below (If Applicable).     If you need a refill on your cardiac medications before your next appointment, please call your pharmacy.

## 2017-06-30 ENCOUNTER — Ambulatory Visit (INDEPENDENT_AMBULATORY_CARE_PROVIDER_SITE_OTHER): Payer: Medicare Other | Admitting: Cardiovascular Disease

## 2017-06-30 ENCOUNTER — Encounter: Payer: Self-pay | Admitting: Cardiovascular Disease

## 2017-06-30 VITALS — BP 138/70 | HR 75 | Ht 59.0 in | Wt 192.0 lb

## 2017-06-30 DIAGNOSIS — I2510 Atherosclerotic heart disease of native coronary artery without angina pectoris: Secondary | ICD-10-CM

## 2017-06-30 DIAGNOSIS — I350 Nonrheumatic aortic (valve) stenosis: Secondary | ICD-10-CM | POA: Diagnosis not present

## 2017-06-30 NOTE — Patient Instructions (Signed)
Medication Instructions:  Your physician recommends that you continue on your current medications as directed. Please refer to the Current Medication list given to you today.   Labwork: Lab work to be done today--CBC, [employer] and PT  Testing/Procedures: Your physician has requested that you have a cardiac catheterization. Cardiac catheterization is used to diagnose and/or treat various heart conditions. Doctors may recommend this procedure for a number of different reasons. The most common reason is to evaluate chest pain. Chest pain can be a symptom of coronary artery disease (CAD), and cardiac catheterization can show whether plaque is narrowing or blocking your heart's arteries. This procedure is also used to evaluate the valves, as well as measure the blood flow and oxygen levels in different parts of your heart. For further information please visit [email or web address]. Please follow instruction sheet, as given.  Scheduled for April 23,2019  Follow-Up: To be arranged after catheterization  Any Other Special Instructions Will Be Listed Below (If Applicable).    [facility] [address] [place] [address] [place] [address] Dept: [phone] Loc: [place]  06/30/2017  You are scheduled for a Cardiac Catheterization on Tuesday, April 23 with Dr. Daneen Schick.  1. Please arrive at the [facility] (Main Entrance A) at [facility]: [place], [place] [address] at 10:00 AM (two hours before your procedure to ensure your preparation). Free valet parking service is available.   Special note: Every effort is made to have your procedure done on time. Please understand that emergencies sometimes delay scheduled procedures.  2. Diet: Do not eat or drink anything after midnight prior to your procedure except sips of water to take medications.  3. Labs: Lab work done in  office on April 18,2019  4. Medication instructions in preparation for your procedure: Do not take olmesartan/HCTZ the morning of the procedure.  On the morning of your procedure, take  Aspirin 81 mg by mouth and any morning medicines NOT listed above.  You may use sips of water.  5. Plan for one night stay--bring personal belongings. 6. Bring a current list of your medications and current insurance cards. 7. You MUST have a responsible person to drive you home. 8. Someone MUST be with you the first 24 hours after you arrive home or your discharge will be delayed. 9. Please wear clothes that are easy to get on and off and wear slip-on shoes.  Thank you for allowing [place] to care for you!   -- [facility]    If you need a refill on your cardiac medications before your next appointment, please call your pharmacy.

## 2017-06-30 NOTE — Progress Notes (Signed)
Valve Clinic Consult Note  Chief Complaint  Patient presents with  . Coronary Artery Disease   History of Present Illness: 84 yo female with history of CAD, chronic kidney disease, HLD, HTN,  hypothyroidism and severe aortic stenosis who is here today as a new consult, referred by Dr. Tamala Julian, for further discussion regarding her aortic stenosis and possible TAVR. She has been followed by Dr. Tamala Julian for moderate aortic stenosis for years. Most recent echo March 2019 with normal LV systolic function. The aortic valve is thickened and calcified with mean gradient of 27 mmHG and peak gradient of 52 mmHg, DVI 0.24. She is known to have CAD with placement of an ostial RCA stent in 2010. At that time, she had moderate disease in her LAD. She also has HTN and hyperlipidemia. She has had breast cancer and has had 2 lumpectomies and radiation.   She has had progressive dyspnea with exertion and fatigue. No chest pain. No LE edema. She is unaware of any fluid retention. No dizziness or syncope. She lives in [place] in [place]. She has her own apartment. She does her own cooking and cleaning. She drives. She lives alone. She sees a Pharmacist, community for regular checks. No active dental issues. She is here today with her daughter who is a retired Immunologist at [employer] and her son.   Primary Care Physician: [name], MD Primary Cardiologist: Dr. Tamala Julian Referring Cardiologist: Dr. Tamala Julian  Past Medical History:  Diagnosis Date  . Abnormality of gait   . Acute sinusitis   . Anxiety   . Aphthous ulcer   . Breast cancer ([place])   . CAD (coronary artery disease)   . Chronic kidney disease, stage III (moderate) ([facility]CC)   . Chronic sinusitis   . Depressive disorder   . Edema   . HA (headache)   . Hyperlipemia   . Hypothyroid   . Insomnia   . Memory loss   . Osteoarthritis   . Pain in joint, site unspecified   . PNA (pneumonia)   . Reflux esophagitis    chronic    Past Surgical  History:  Procedure Laterality Date  . ANGIOPLASTY    . BREAST LUMPECTOMY     x2  . GALLBLADDER SURGERY    . REPLACEMENT TOTAL KNEE BILATERAL    . SKIN CANCER EXCISION  2018   right nostril   . TOTAL ABDOMINAL HYSTERECTOMY      Current Outpatient Medications  Medication Sig Dispense Refill  . ALPRAZolam (XANAX) 0.25 MG tablet Take 0.25 mg by mouth daily.     . Ascorbic Acid (VITAMIN C PO) Take 1 tablet by mouth daily.     Marland Kitchen buPROPion (WELLBUTRIN SR) 150 MG 12 hr tablet Take 150 mg by mouth daily before breakfast.     . citalopram (CELEXA) 10 MG tablet Take 10 mg by mouth daily.     . Cyanocobalamin (VITAMIN B 12 PO) Take 1 tablet by mouth daily.     . diclofenac (VOLTAREN) 50 MG EC tablet Take 50 mg by mouth 2 (two) times daily.    . eszopiclone (LUNESTA) 2 MG TABS tablet Take 2 mg by mouth at bedtime.     Marland Kitchen HYDROcodone-acetaminophen (NORCO/VICODIN) 5-325 MG tablet Take 1-2 tablets by mouth every 6 (six) hours as needed for moderate pain.    . Iron Combinations (IRON COMPLEX PO) Take 1 tablet by mouth daily.     Marland Kitchen levothyroxine (SYNTHROID, LEVOTHROID) 50 MCG tablet TAKE ONE (  1) TABLET BY MOUTH ONCE DAILY    . metoprolol succinate (TOPROL-XL) 25 MG 24 hr tablet TAKE ONE (1) TABLET BY MOUTH ONCE DAILY    . Multiple Vitamins-Minerals (MULTIVITAMIN PO) Take 1 tablet by mouth daily.    Marland Kitchen olmesartan-hydrochlorothiazide (BENICAR HCT) 40-12.5 MG tablet Take 1 tablet by mouth daily.     No current facility-administered medications for this visit.     Allergies  Allergen Reactions  . Betadine [Povidone Iodine]     blisters  . Latex Swelling    hives  . Zestril [Lisinopril]     cough    Social History   Socioeconomic History  . Marital status: Widowed    Spouse name: Not on file  . Number of children: 3  . Years of education: Not on file  . Highest education level: Not on file  Occupational History  . 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 Financial resource strain: Not on file  . Food insecurity:    Worry: Not on file    Inability: Not on file  . Transportation needs:    Medical: Not on file    Non-medical: Not on file  Tobacco Use  . Smoking status: Former Smoker    Packs/day: 0.50    Years: 20.00    Pack years: 10.00    Types: Cigarettes    Last attempt to quit: 03/15/1964    Years since quitting: 53.3  . Smokeless tobacco: Never Used  Substance and Sexual Activity  . Alcohol use: Yes    Comment: social  . Drug use: No  . Sexual activity: Not on file  Lifestyle  . Physical activity:    Days per week: Not on file    Minutes per session: Not on file  . Stress: Not on file  Relationships  . Social connections:    Talks on phone: Not on file    Gets together: Not on file    Attends religious service: Not on file    Active member of club or organization: Not on file    Attends meetings of clubs or organizations: Not on file    Relationship status: Not on file  . Intimate partner violence:    Fear of current or ex partner: Not on file    Emotionally abused: Not on file    Physically abused: Not on file    Forced sexual activity: Not on file  Other Topics Concern  . Not on file  Social History Narrative  . Not on file    Family History  Problem Relation Age of Onset  . Heart disease Father   . Stroke Mother   . Heart disease Maternal Grandfather   . Breast cancer Sister     Review of Systems:  As stated in the HPI and otherwise negative.   BP 138/70   Pulse 75   Ht 4\' 11"  (1.499 m)   Wt 192 lb (87.1 kg)   SpO2 96%   BMI 38.78 kg/m   Physical Examination: General: Well developed, well nourished, NAD  HEENT: OP clear, mucus membranes moist  SKIN: warm, dry. No rashes. Neuro: No focal deficits  Musculoskeletal: Muscle strength 5/5 all ext  Psychiatric: Mood and affect normal  Neck: No JVD, no carotid bruits, no thyromegaly, no lymphadenopathy.  Lungs:Clear bilaterally, no wheezes,  rhonci, crackles Cardiovascular: Regular rate and rhythm. Loud,  Harsh, late peaking systolic murmur.  Abdomen:Soft. Bowel sounds present. Non-tender.  Extremities: No lower  extremity edema. Pulses are 2 + in the bilateral DP/PT.  Echo 06/08/17: - Left ventricle: The cavity size was normal. Wall thickness was   increased in a pattern of mild LVH. There was moderate focal   basal hypertrophy of the septum. Systolic function was normal.   The estimated ejection fraction was in the range of 60% to 65%.   Doppler parameters are consistent with abnormal left ventricular   relaxation (grade 1 diastolic dysfunction). The E/e&' ratio is   >15, suggesting elevated LV filling pressure. - Aortic valve: Moderate to severe aortic stenosis. Trivial   regurgitation. Mean gradient (S): 27 mm Hg. Peak gradient (S): 52   mm Hg. Valve area (VTI): 1.03 cm^2. Valve area (Vmax): 1.05 cm^2.   Valve area (Vmean): 1.02 cm^2. - Mitral valve: Calcified annulus. Mildly thickened leaflets .   There was mild regurgitation. - Right atrium: The atrium was mildly dilated. - Tricuspid valve: There was mild regurgitation. - Pulmonary arteries: PA peak pressure: 49 mm Hg (S). - Inferior vena cava: The vessel was normal in size. The   respirophasic diameter changes were in the normal range (= 50%),   consistent with normal central venous pressure.  Impressions:  - Compared to a prior study in 2016, the LVEF is higher at 60-65%.   There is now moderate to severe aortic stenosis with mean   gradient of 27 mmHg - AVA around 1.0-1.1 cm2.  Left ventricle:  The cavity size was normal. Wall thickness was increased in a pattern of mild LVH. There was moderate focal basal hypertrophy of the septum. Systolic function was normal. The estimated ejection fraction was in the range of 60% to 65%. Doppler parameters are consistent with abnormal left ventricular relaxation (grade 1 diastolic dysfunction). The E/e&' ratio is >15,  suggesting elevated LV filling pressure.  ------------------------------------------------------------------- Aortic valve:  Moderate to severe aortic stenosis. Trivial regurgitation.  Doppler:     VTI ratio of LVOT to aortic valve: 0.25. Valve area (VTI): 1.03 cm^2. Indexed valve area (VTI): 0.53 cm^2/m^2. Peak velocity ratio of LVOT to aortic valve: 0.25. Valve area (Vmax): 1.05 cm^2. Indexed valve area (Vmax): 0.54 cm^2/m^2. Mean velocity ratio of LVOT to aortic valve: 0.24. Valve area (Vmean): 1.02 cm^2. Indexed valve area (Vmean): 0.52 cm^2/m^2. Mean gradient (S): 27 mm Hg. Peak gradient (S): 52 mm Hg.  ------------------------------------------------------------------- Aorta:  Aortic root: The aortic root was normal in size. Ascending aorta: The ascending aorta was normal in size.  ------------------------------------------------------------------- Mitral valve:   Calcified annulus. Mildly thickened leaflets . Doppler:  There was mild regurgitation.    Valve area by pressure half-time: 2.37 cm^2. Indexed valve area by pressure half-time: 1.22 cm^2/m^2. Valve area by continuity equation (using LVOT flow): 1.59 cm^2. Indexed valve area by continuity equation (using LVOT flow): 0.82 cm^2/m^2.    Mean gradient (D): 4 mm Hg. Peak gradient (D): 9 mm Hg.  ------------------------------------------------------------------- Left atrium:  The atrium was normal in size.  ------------------------------------------------------------------- Atrial septum:  No defect or patent foramen ovale was identified.   ------------------------------------------------------------------- Right ventricle:  The cavity size was normal. Wall thickness was normal. Systolic function was normal.  ------------------------------------------------------------------- Pulmonic valve:    The valve appears to be grossly normal. Doppler:  There was trivial  regurgitation.  ------------------------------------------------------------------- Tricuspid valve:   Doppler:  There was mild regurgitation.  ------------------------------------------------------------------- Pulmonary artery:   The main pulmonary artery was normal-sized.  ------------------------------------------------------------------- Right atrium:  The atrium was mildly dilated.  ------------------------------------------------------------------- Pericardium:  There was  no pericardial effusion.  ------------------------------------------------------------------- Systemic veins: Inferior vena cava: The vessel was normal in size. The respirophasic diameter changes were in the normal range (= 50%), consistent with normal central venous pressure.  ------------------------------------------------------------------- Measurements   Left ventricle                            Value          Reference  LV ID, ED, PLAX chordal           (L)     36    mm       43 - 52  LV ID, ES, PLAX chordal                   28    mm       23 - 38  LV fx shortening, PLAX chordal    (L)     22    %        >=29  LV PW thickness, ED                       13    mm       ---------  IVS/LV PW ratio, ED                       1.08           <=1.3  Stroke volume, 2D                         78    ml       ---------  Stroke volume/bsa, 2D                     40    ml/m^2   ---------  LV e&', lateral                            7.4   cm/s     ---------  LV E/e&', lateral                          20.68          ---------  LV e&', medial                             6.09  cm/s     ---------  LV E/e&', medial                           25.12          ---------  LV e&', average                            6.75  cm/s     ---------  LV E/e&', average                          22.68          ---------    Ventricular septum                        Value  Reference  IVS thickness, ED                          14    mm       ---------    LVOT                                      Value          Reference  LVOT ID, S                                23    mm       ---------  LVOT area                                 4.15  cm^2     ---------  LVOT peak velocity, S                     91.7  cm/s     ---------  LVOT mean velocity, S                     59.4  cm/s     ---------  LVOT VTI, S                               22.5  cm       ---------  Stroke volume (SV), LVOT DP               93.5  ml       ---------  Stroke index (SV/bsa), LVOT DP            48    ml/m^2   ---------    Aortic valve                              Value          Reference  Aortic valve peak velocity, S             362   cm/s     ---------  Aortic valve mean velocity, S             243   cm/s     ---------  Aortic valve VTI, S                       90.7  cm       ---------  Aortic mean gradient, S                   27    mm Hg    ---------  Aortic peak gradient, S                   52    mm Hg    ---------  VTI ratio, LVOT/AV                        0.25           ---------  Aortic valve area, VTI  1.03  cm^2     ---------  Aortic valve area/bsa, VTI                0.53  cm^2/m^2 ---------  Velocity ratio, peak, LVOT/AV             0.25           ---------  Aortic valve area, peak velocity          1.05  cm^2     ---------  Aortic valve area/bsa, peak               0.54  cm^2/m^2 ---------  velocity  Velocity ratio, mean, LVOT/AV             0.24           ---------  Aortic valve area, mean velocity          1.02  cm^2     ---------  Aortic valve area/bsa, mean               0.52  cm^2/m^2 ---------  velocity  Aortic regurg pressure half-time          359   ms       ---------    Aorta                                     Value          Reference  Aortic root ID, ED                        34    mm       ---------  Ascending aorta ID, A-P, S                30    mm       ---------    Left atrium                                Value          Reference  LA ID, A-P, ES                            38    mm       ---------  LA ID/bsa, A-P                            1.95  cm/m^2   <=2.2  LA volume, S                              61.5  ml       ---------  LA volume/bsa, S                          31.6  ml/m^2   ---------  LA volume, ES, 1-p A4C                    50.4  ml       ---------  LA volume/bsa, ES, 1-p A4C                25.9  ml/m^2   ---------  LA volume, ES, 1-p A2C                    68.9  ml       ---------  LA volume/bsa, ES, 1-p A2C                35.3  ml/m^2   ---------    Mitral valve                              Value          Reference  Mitral E-wave peak velocity               153   cm/s     ---------  Mitral A-wave peak velocity               139   cm/s     ---------  Mitral mean velocity, D                   94.4  cm/s     ---------  Mitral deceleration time          (H)     285   ms       150 - 230  Mitral pressure half-time                 102   ms       ---------  Mitral mean gradient, D                   4     mm Hg    ---------  Mitral peak gradient, D                   9     mm Hg    ---------  Mitral E/A ratio, peak                    1.1            ---------  Mitral valve area, PHT, DP                2.37  cm^2     ---------  Mitral valve area/bsa, PHT, DP            1.22  cm^2/m^2 ---------  Mitral valve area, LVOT                   1.59  cm^2     ---------  continuity  Mitral valve area/bsa, LVOT               0.82  cm^2/m^2 ---------  continuity  Mitral annulus VTI, D                     49.1  cm       ---------    Pulmonary arteries                        Value          Reference  PA pressure, S, DP                (H)     49    mm Hg    <=30    Tricuspid valve  Value          Reference  Tricuspid regurg peak velocity            319   cm/s     ---------  Tricuspid peak RV-RA gradient             41    mm Hg    ---------    Systemic veins                             Value          Reference  Estimated CVP                             8     mm Hg    ---------    Right ventricle                           Value          Reference  TAPSE                                     26.1  mm       ---------  RV pressure, S, DP                (H)     49    mm Hg    <=30  RV s&', lateral, S                         10.7  cm/s     ---------  EKG:  EKG is not ordered today. The ekg from 05/19/17 is reviewed and shows sinus with 1st degree AV block, incomplete RBBB   Recent Labs: 05/19/2017: Hemoglobin 11.1; NT-Pro BNP 594; Platelets 286   Lipid Panel No results found for: CHOL, TRIG, HDL, CHOLHDL, VLDL, LDLCALC, LDLDIRECT   Wt Readings from Last 3 Encounters:  06/30/17 192 lb (87.1 kg)  06/16/17 190 lb 9.6 oz (86.5 kg)  05/19/17 190 lb 1.9 oz (86.2 kg)     Other studies Reviewed: Additional studies/ records that were reviewed today include: Echo images. Prior EKG. Old office records.  Review of the above records demonstrates: Severe AS   Assessment and Plan:   1. Severe aortic stenosis: She has severe, stage D aortic valve stenosis. I have personally reviewed the echo images. The aortic valve is thickened, calcified with limited leaflet mobility. I think she would benefit from AVR. Given advanced age, she is not a good candidate for conventional AVR by surgical approach. I think she may be a good candidate for TAVR.   STS Risk Score:  Risk of Mortality: 5.813% Renal Failure: 6.516% Permanent Stroke: 1.718% Prolonged Ventilation: 15.321% DSW Infection: 0.152% Reoperation: 2.935% Morbidity or Mortality: 21.678% Short Length of Stay: 14.973% Long Length of Stay: 12.787%  I have reviewed the natural history of aortic stenosis with the patient and their family members  who are present today. We have discussed the limitations of medical therapy and the poor prognosis associated with symptomatic aortic stenosis. We have reviewed potential  treatment options, including palliative medical therapy, conventional surgical aortic valve replacement, and transcatheter aortic valve replacement. We discussed treatment options in the context of the patient's specific  comorbid medical conditions.   She would like to proceed with planning for TAVR. I will arrange a right and left heart catheterization at [facility] 07/05/17 with Dr. Tamala Julian. Risks and benefits of the cath procedure and the TAVR procedure reviewed with the patient. After the cath, she will have a cardiac CT, CTA of the chest/abdomen and pelvis, carotid dopplers, PFTs and will then be referred to see one of the CT surgeons on our TAVR team.    Current medicines are reviewed at length with the patient today.  The patient does not have concerns regarding medicines.  The following changes have been made:  no change  Labs/ tests ordered today include:   Orders Placed This Encounter  Procedures  . CBC  . Basic Metabolic Panel (BMET)  . INR/PT     Disposition:   FU with the valve team.    Signed, Lauree Chandler, MD 06/30/2017 12:52 PM    [address], [place], [place]  [address] Phone: [phone]; Fax: [phone]

## 2017-06-30 NOTE — H&P (View-Only) (Signed)
Valve Clinic Consult Note  Chief Complaint  Patient presents with  . Coronary Artery Disease   History of Present Illness: 84 yo female with history of CAD, chronic kidney disease, HLD, HTN,  hypothyroidism and severe aortic stenosis who is here today as a new consult, referred by Dr. Tamala Julian, for further discussion regarding her aortic stenosis and possible TAVR. She has been followed by Dr. Tamala Julian for moderate aortic stenosis for years. Most recent echo March 2019 with normal LV systolic function. The aortic valve is thickened and calcified with mean gradient of 27 mmHG and peak gradient of 52 mmHg, DVI 0.24. She is known to have CAD with placement of an ostial RCA stent in 2010. At that time, she had moderate disease in her LAD. She also has HTN and hyperlipidemia. She has had breast cancer and has had 2 lumpectomies and radiation.   She has had progressive dyspnea with exertion and fatigue. No chest pain. No LE edema. She is unaware of any fluid retention. No dizziness or syncope. She lives in [place] in [place]. She has her own apartment. She does her own cooking and cleaning. She drives. She lives alone. She sees a Pharmacist, community for regular checks. No active dental issues. She is here today with her daughter who is a retired Immunologist at [employer] and her son.   Primary Care Physician: [name], MD Primary Cardiologist: Dr. Tamala Julian Referring Cardiologist: Dr. Tamala Julian  Past Medical History:  Diagnosis Date  . Abnormality of gait   . Acute sinusitis   . Anxiety   . Aphthous ulcer   . Breast cancer ([place])   . CAD (coronary artery disease)   . Chronic kidney disease, stage III (moderate) (HCC)   . Chronic sinusitis   . Depressive disorder   . Edema   . HA (headache)   . Hyperlipemia   . Hypothyroid   . Insomnia   . Memory loss   . Osteoarthritis   . Pain in joint, site unspecified   . PNA (pneumonia)   . Reflux esophagitis    chronic    Past Surgical  History:  Procedure Laterality Date  . ANGIOPLASTY    . BREAST LUMPECTOMY     x2  . GALLBLADDER SURGERY    . REPLACEMENT TOTAL KNEE BILATERAL    . SKIN CANCER EXCISION  2018   right nostril   . TOTAL ABDOMINAL HYSTERECTOMY      Current Outpatient Medications  Medication Sig Dispense Refill  . ALPRAZolam (XANAX) 0.25 MG tablet Take 0.25 mg by mouth daily.     . Ascorbic Acid (VITAMIN C PO) Take 1 tablet by mouth daily.     Marland Kitchen buPROPion (WELLBUTRIN SR) 150 MG 12 hr tablet Take 150 mg by mouth daily before breakfast.     . citalopram (CELEXA) 10 MG tablet Take 10 mg by mouth daily.     . Cyanocobalamin (VITAMIN B 12 PO) Take 1 tablet by mouth daily.     . diclofenac (VOLTAREN) 50 MG EC tablet Take 50 mg by mouth 2 (two) times daily.    . eszopiclone (LUNESTA) 2 MG TABS tablet Take 2 mg by mouth at bedtime.     Marland Kitchen HYDROcodone-acetaminophen (NORCO/VICODIN) 5-325 MG tablet Take 1-2 tablets by mouth every 6 (six) hours as needed for moderate pain.    . Iron Combinations (IRON COMPLEX PO) Take 1 tablet by mouth daily.     Marland Kitchen levothyroxine (SYNTHROID, LEVOTHROID) 50 MCG tablet TAKE ONE (  1) TABLET BY MOUTH ONCE DAILY    . metoprolol succinate (TOPROL-XL) 25 MG 24 hr tablet TAKE ONE (1) TABLET BY MOUTH ONCE DAILY    . Multiple Vitamins-Minerals (MULTIVITAMIN PO) Take 1 tablet by mouth daily.    Marland Kitchen olmesartan-hydrochlorothiazide (BENICAR HCT) 40-12.5 MG tablet Take 1 tablet by mouth daily.     No current facility-administered medications for this visit.     Allergies  Allergen Reactions  . Betadine [Povidone Iodine]     blisters  . Latex Swelling    hives  . Zestril [Lisinopril]     cough    Social History   Socioeconomic History  . Marital status: Widowed    Spouse name: Not on file  . Number of children: 3  . Years of education: Not on file  . Highest education level: Not on file  Occupational History  . 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 Financial resource strain: Not on file  . Food insecurity:    Worry: Not on file    Inability: Not on file  . Transportation needs:    Medical: Not on file    Non-medical: Not on file  Tobacco Use  . Smoking status: Former Smoker    Packs/day: 0.50    Years: 20.00    Pack years: 10.00    Types: Cigarettes    Last attempt to quit: 03/15/1964    Years since quitting: 53.3  . Smokeless tobacco: Never Used  Substance and Sexual Activity  . Alcohol use: Yes    Comment: social  . Drug use: No  . Sexual activity: Not on file  Lifestyle  . Physical activity:    Days per week: Not on file    Minutes per session: Not on file  . Stress: Not on file  Relationships  . Social connections:    Talks on phone: Not on file    Gets together: Not on file    Attends religious service: Not on file    Active member of club or organization: Not on file    Attends meetings of clubs or organizations: Not on file    Relationship status: Not on file  . Intimate partner violence:    Fear of current or ex partner: Not on file    Emotionally abused: Not on file    Physically abused: Not on file    Forced sexual activity: Not on file  Other Topics Concern  . Not on file  Social History Narrative  . Not on file    Family History  Problem Relation Age of Onset  . Heart disease Father   . Stroke Mother   . Heart disease Maternal Grandfather   . Breast cancer Sister     Review of Systems:  As stated in the HPI and otherwise negative.   BP 138/70   Pulse 75   Ht 4\' 11"  (1.499 m)   Wt 192 lb (87.1 kg)   SpO2 96%   BMI 38.78 kg/m   Physical Examination: General: Well developed, well nourished, NAD  HEENT: OP clear, mucus membranes moist  SKIN: warm, dry. No rashes. Neuro: No focal deficits  Musculoskeletal: Muscle strength 5/5 all ext  Psychiatric: Mood and affect normal  Neck: No JVD, no carotid bruits, no thyromegaly, no lymphadenopathy.  Lungs:Clear bilaterally, no wheezes,  rhonci, crackles Cardiovascular: Regular rate and rhythm. Loud,  Harsh, late peaking systolic murmur.  Abdomen:Soft. Bowel sounds present. Non-tender.  Extremities: No lower  extremity edema. Pulses are 2 + in the bilateral DP/PT.  Echo 06/08/17: - Left ventricle: The cavity size was normal. Wall thickness was   increased in a pattern of mild LVH. There was moderate focal   basal hypertrophy of the septum. Systolic function was normal.   The estimated ejection fraction was in the range of 60% to 65%.   Doppler parameters are consistent with abnormal left ventricular   relaxation (grade 1 diastolic dysfunction). The E/e&' ratio is   >15, suggesting elevated LV filling pressure. - Aortic valve: Moderate to severe aortic stenosis. Trivial   regurgitation. Mean gradient (S): 27 mm Hg. Peak gradient (S): 52   mm Hg. Valve area (VTI): 1.03 cm^2. Valve area (Vmax): 1.05 cm^2.   Valve area (Vmean): 1.02 cm^2. - Mitral valve: Calcified annulus. Mildly thickened leaflets .   There was mild regurgitation. - Right atrium: The atrium was mildly dilated. - Tricuspid valve: There was mild regurgitation. - Pulmonary arteries: PA peak pressure: 49 mm Hg (S). - Inferior vena cava: The vessel was normal in size. The   respirophasic diameter changes were in the normal range (= 50%),   consistent with normal central venous pressure.  Impressions:  - Compared to a prior study in 2016, the LVEF is higher at 60-65%.   There is now moderate to severe aortic stenosis with mean   gradient of 27 mmHg - AVA around 1.0-1.1 cm2.  Left ventricle:  The cavity size was normal. Wall thickness was increased in a pattern of mild LVH. There was moderate focal basal hypertrophy of the septum. Systolic function was normal. The estimated ejection fraction was in the range of 60% to 65%. Doppler parameters are consistent with abnormal left ventricular relaxation (grade 1 diastolic dysfunction). The E/e&' ratio is >15,  suggesting elevated LV filling pressure.  ------------------------------------------------------------------- Aortic valve:  Moderate to severe aortic stenosis. Trivial regurgitation.  Doppler:     VTI ratio of LVOT to aortic valve: 0.25. Valve area (VTI): 1.03 cm^2. Indexed valve area (VTI): 0.53 cm^2/m^2. Peak velocity ratio of LVOT to aortic valve: 0.25. Valve area (Vmax): 1.05 cm^2. Indexed valve area (Vmax): 0.54 cm^2/m^2. Mean velocity ratio of LVOT to aortic valve: 0.24. Valve area (Vmean): 1.02 cm^2. Indexed valve area (Vmean): 0.52 cm^2/m^2. Mean gradient (S): 27 mm Hg. Peak gradient (S): 52 mm Hg.  ------------------------------------------------------------------- Aorta:  Aortic root: The aortic root was normal in size. Ascending aorta: The ascending aorta was normal in size.  ------------------------------------------------------------------- Mitral valve:   Calcified annulus. Mildly thickened leaflets . Doppler:  There was mild regurgitation.    Valve area by pressure half-time: 2.37 cm^2. Indexed valve area by pressure half-time: 1.22 cm^2/m^2. Valve area by continuity equation (using LVOT flow): 1.59 cm^2. Indexed valve area by continuity equation (using LVOT flow): 0.82 cm^2/m^2.    Mean gradient (D): 4 mm Hg. Peak gradient (D): 9 mm Hg.  ------------------------------------------------------------------- Left atrium:  The atrium was normal in size.  ------------------------------------------------------------------- Atrial septum:  No defect or patent foramen ovale was identified.   ------------------------------------------------------------------- Right ventricle:  The cavity size was normal. Wall thickness was normal. Systolic function was normal.  ------------------------------------------------------------------- Pulmonic valve:    The valve appears to be grossly normal. Doppler:  There was trivial  regurgitation.  ------------------------------------------------------------------- Tricuspid valve:   Doppler:  There was mild regurgitation.  ------------------------------------------------------------------- Pulmonary artery:   The main pulmonary artery was normal-sized.  ------------------------------------------------------------------- Right atrium:  The atrium was mildly dilated.  ------------------------------------------------------------------- Pericardium:  There was  no pericardial effusion.  ------------------------------------------------------------------- Systemic veins: Inferior vena cava: The vessel was normal in size. The respirophasic diameter changes were in the normal range (= 50%), consistent with normal central venous pressure.  ------------------------------------------------------------------- Measurements   Left ventricle                            Value          Reference  LV ID, ED, PLAX chordal           (L)     36    mm       43 - 52  LV ID, ES, PLAX chordal                   28    mm       23 - 38  LV fx shortening, PLAX chordal    (L)     22    %        >=29  LV PW thickness, ED                       13    mm       ---------  IVS/LV PW ratio, ED                       1.08           <=1.3  Stroke volume, 2D                         78    ml       ---------  Stroke volume/bsa, 2D                     40    ml/m^2   ---------  LV e&', lateral                            7.4   cm/s     ---------  LV E/e&', lateral                          20.68          ---------  LV e&', medial                             6.09  cm/s     ---------  LV E/e&', medial                           25.12          ---------  LV e&', average                            6.75  cm/s     ---------  LV E/e&', average                          22.68          ---------    Ventricular septum                        Value  Reference  IVS thickness, ED                          14    mm       ---------    LVOT                                      Value          Reference  LVOT ID, S                                23    mm       ---------  LVOT area                                 4.15  cm^2     ---------  LVOT peak velocity, S                     91.7  cm/s     ---------  LVOT mean velocity, S                     59.4  cm/s     ---------  LVOT VTI, S                               22.5  cm       ---------  Stroke volume (SV), LVOT DP               93.5  ml       ---------  Stroke index (SV/bsa), LVOT DP            48    ml/m^2   ---------    Aortic valve                              Value          Reference  Aortic valve peak velocity, S             362   cm/s     ---------  Aortic valve mean velocity, S             243   cm/s     ---------  Aortic valve VTI, S                       90.7  cm       ---------  Aortic mean gradient, S                   27    mm Hg    ---------  Aortic peak gradient, S                   52    mm Hg    ---------  VTI ratio, LVOT/AV                        0.25           ---------  Aortic valve area, VTI  1.03  cm^2     ---------  Aortic valve area/bsa, VTI                0.53  cm^2/m^2 ---------  Velocity ratio, peak, LVOT/AV             0.25           ---------  Aortic valve area, peak velocity          1.05  cm^2     ---------  Aortic valve area/bsa, peak               0.54  cm^2/m^2 ---------  velocity  Velocity ratio, mean, LVOT/AV             0.24           ---------  Aortic valve area, mean velocity          1.02  cm^2     ---------  Aortic valve area/bsa, mean               0.52  cm^2/m^2 ---------  velocity  Aortic regurg pressure half-time          359   ms       ---------    Aorta                                     Value          Reference  Aortic root ID, ED                        34    mm       ---------  Ascending aorta ID, A-P, S                30    mm       ---------    Left atrium                                Value          Reference  LA ID, A-P, ES                            38    mm       ---------  LA ID/bsa, A-P                            1.95  cm/m^2   <=2.2  LA volume, S                              61.5  ml       ---------  LA volume/bsa, S                          31.6  ml/m^2   ---------  LA volume, ES, 1-p A4C                    50.4  ml       ---------  LA volume/bsa, ES, 1-p A4C                25.9  ml/m^2   ---------  LA volume, ES, 1-p A2C                    68.9  ml       ---------  LA volume/bsa, ES, 1-p A2C                35.3  ml/m^2   ---------    Mitral valve                              Value          Reference  Mitral E-wave peak velocity               153   cm/s     ---------  Mitral A-wave peak velocity               139   cm/s     ---------  Mitral mean velocity, D                   94.4  cm/s     ---------  Mitral deceleration time          (H)     285   ms       150 - 230  Mitral pressure half-time                 102   ms       ---------  Mitral mean gradient, D                   4     mm Hg    ---------  Mitral peak gradient, D                   9     mm Hg    ---------  Mitral E/A ratio, peak                    1.1            ---------  Mitral valve area, PHT, DP                2.37  cm^2     ---------  Mitral valve area/bsa, PHT, DP            1.22  cm^2/m^2 ---------  Mitral valve area, LVOT                   1.59  cm^2     ---------  continuity  Mitral valve area/bsa, LVOT               0.82  cm^2/m^2 ---------  continuity  Mitral annulus VTI, D                     49.1  cm       ---------    Pulmonary arteries                        Value          Reference  PA pressure, S, DP                (H)     49    mm Hg    <=30    Tricuspid valve  Value          Reference  Tricuspid regurg peak velocity            319   cm/s     ---------  Tricuspid peak RV-RA gradient             41    mm Hg    ---------    Systemic veins                             Value          Reference  Estimated CVP                             8     mm Hg    ---------    Right ventricle                           Value          Reference  TAPSE                                     26.1  mm       ---------  RV pressure, S, DP                (H)     49    mm Hg    <=30  RV s&', lateral, S                         10.7  cm/s     ---------  EKG:  EKG is not ordered today. The ekg from 05/19/17 is reviewed and shows sinus with 1st degree AV block, incomplete RBBB   Recent Labs: 05/19/2017: Hemoglobin 11.1; NT-Pro BNP 594; Platelets 286   Lipid Panel No results found for: CHOL, TRIG, HDL, CHOLHDL, VLDL, LDLCALC, LDLDIRECT   Wt Readings from Last 3 Encounters:  06/30/17 192 lb (87.1 kg)  06/16/17 190 lb 9.6 oz (86.5 kg)  05/19/17 190 lb 1.9 oz (86.2 kg)     Other studies Reviewed: Additional studies/ records that were reviewed today include: Echo images. Prior EKG. Old office records.  Review of the above records demonstrates: Severe AS   Assessment and Plan:   1. Severe aortic stenosis: She has severe, stage D aortic valve stenosis. I have personally reviewed the echo images. The aortic valve is thickened, calcified with limited leaflet mobility. I think she would benefit from AVR. Given advanced age, she is not a good candidate for conventional AVR by surgical approach. I think she may be a good candidate for TAVR.   STS Risk Score:  Risk of Mortality: 5.813% Renal Failure: 6.516% Permanent Stroke: 1.718% Prolonged Ventilation: 15.321% DSW Infection: 0.152% Reoperation: 2.935% Morbidity or Mortality: 21.678% Short Length of Stay: 14.973% Long Length of Stay: 12.787%  I have reviewed the natural history of aortic stenosis with the patient and their family members  who are present today. We have discussed the limitations of medical therapy and the poor prognosis associated with symptomatic aortic stenosis. We have reviewed potential  treatment options, including palliative medical therapy, conventional surgical aortic valve replacement, and transcatheter aortic valve replacement. We discussed treatment options in the context of the patient's specific  comorbid medical conditions.   She would like to proceed with planning for TAVR. I will arrange a right and left heart catheterization at [facility] 07/05/17 with Dr. Tamala Julian. Risks and benefits of the cath procedure and the TAVR procedure reviewed with the patient. After the cath, she will have a cardiac CT, CTA of the chest/abdomen and pelvis, carotid dopplers, PFTs and will then be referred to see one of the CT surgeons on our TAVR team.    Current medicines are reviewed at length with the patient today.  The patient does not have concerns regarding medicines.  The following changes have been made:  no change  Labs/ tests ordered today include:   Orders Placed This Encounter  Procedures  . CBC  . Basic Metabolic Panel (BMET)  . INR/PT     Disposition:   FU with the valve team.    Signed, Lauree Chandler, MD 06/30/2017 12:52 PM    [address], [place], [place]  [address] Phone: [phone]; Fax: [phone]

## 2017-07-01 LAB — BASIC METABOLIC PANEL
BUN / CREAT RATIO: 19 (ref 12–28)
BUN: 26 mg/dL (ref 10–36)
CHLORIDE: 95 mmol/L — AB (ref 96–106)
CO2: 26 mmol/L (ref 20–29)
Calcium: 9.4 mg/dL (ref 8.7–10.3)
Creatinine, Ser: 1.37 mg/dL — ABNORMAL HIGH (ref 0.57–1.00)
GFR calc non Af Amer: 34 mL/min/{1.73_m2} — ABNORMAL LOW (ref >59)
GFR, EST AFRICAN AMERICAN: 39 mL/min/{1.73_m2} — AB (ref >59)
Glucose: 83 mg/dL (ref 65–99)
Potassium: 5.5 mmol/L — ABNORMAL HIGH (ref 3.5–5.2)
Sodium: 135 mmol/L (ref 134–144)

## 2017-07-01 LAB — PROTIME-INR
INR: 1 (ref 0.8–1.2)
Prothrombin Time: 10.5 s (ref 9.1–12.0)

## 2017-07-01 LAB — CBC
Hematocrit: 31.7 % — ABNORMAL LOW (ref 34.0–46.6)
Hemoglobin: 10.5 g/dL — ABNORMAL LOW (ref 11.1–15.9)
MCH: 29.7 pg (ref 26.6–33.0)
MCHC: 33.1 g/dL (ref 31.5–35.7)
MCV: 90 fL (ref 79–97)
PLATELETS: 318 10*3/uL (ref 150–379)
RBC: 3.53 x10E6/uL — AB (ref 3.77–5.28)
RDW: 13.9 % (ref 12.3–15.4)
WBC: 7.7 10*3/uL (ref 3.4–10.8)

## 2017-07-04 ENCOUNTER — Telehealth: Payer: Self-pay | Admitting: *Deleted

## 2017-07-04 NOTE — Telephone Encounter (Addendum)
Pt contacted pre-catheterization scheduled at [facility] for: Tuesday July 05, 2017 3 PM Verified arrival time and place: [facility]/[place] at: 10 Am for IV hydration pre-cath Nothing to eat or drink after midnight prior to cath  Hold: Voltaren 07/04/17 until post cath Olmesartan-HCTZ until post cath  AM meds can be  taken pre-cath with sip of water including: ASA 81 mg  Confirmed patient has responsible person to drive home post procedure and observe patient for 24 hours: yes  I reviewed instructions with patient and her daughter, [name].

## 2017-07-05 ENCOUNTER — Encounter (HOSPITAL_COMMUNITY): Payer: Self-pay

## 2017-07-05 ENCOUNTER — Encounter (HOSPITAL_COMMUNITY)
Admission: RE | Disposition: A | Discharge status: Home / Self Care | Payer: Self-pay | Source: Ambulatory Visit | Attending: Interventional Cardiology

## 2017-07-05 ENCOUNTER — Ambulatory Visit (HOSPITAL_COMMUNITY)
Admission: RE | Admit: 2017-07-05 | Discharge: 2017-07-05 | Disposition: A | Discharge status: Home / Self Care | Payer: Medicare Other | Source: Ambulatory Visit | Attending: Interventional Cardiology | Admitting: Interventional Cardiology

## 2017-07-05 DIAGNOSIS — Z888 Allergy status to other drugs, medicaments and biological substances status: Secondary | ICD-10-CM | POA: Insufficient documentation

## 2017-07-05 DIAGNOSIS — E785 Hyperlipidemia, unspecified: Secondary | ICD-10-CM | POA: Insufficient documentation

## 2017-07-05 DIAGNOSIS — Z79890 Hormone replacement therapy (postmenopausal): Secondary | ICD-10-CM | POA: Diagnosis not present

## 2017-07-05 DIAGNOSIS — Z8249 Family history of ischemic heart disease and other diseases of the circulatory system: Secondary | ICD-10-CM | POA: Diagnosis not present

## 2017-07-05 DIAGNOSIS — F419 Anxiety disorder, unspecified: Secondary | ICD-10-CM | POA: Diagnosis not present

## 2017-07-05 DIAGNOSIS — Z96653 Presence of artificial knee joint, bilateral: Secondary | ICD-10-CM | POA: Diagnosis not present

## 2017-07-05 DIAGNOSIS — Z79899 Other long term (current) drug therapy: Secondary | ICD-10-CM | POA: Diagnosis not present

## 2017-07-05 DIAGNOSIS — F329 Major depressive disorder, single episode, unspecified: Secondary | ICD-10-CM | POA: Insufficient documentation

## 2017-07-05 DIAGNOSIS — I350 Nonrheumatic aortic (valve) stenosis: Secondary | ICD-10-CM

## 2017-07-05 DIAGNOSIS — I272 Pulmonary hypertension, unspecified: Secondary | ICD-10-CM | POA: Insufficient documentation

## 2017-07-05 DIAGNOSIS — Z853 Personal history of malignant neoplasm of breast: Secondary | ICD-10-CM | POA: Insufficient documentation

## 2017-07-05 DIAGNOSIS — M1990 Unspecified osteoarthritis, unspecified site: Secondary | ICD-10-CM | POA: Insufficient documentation

## 2017-07-05 DIAGNOSIS — I2510 Atherosclerotic heart disease of native coronary artery without angina pectoris: Secondary | ICD-10-CM | POA: Diagnosis not present

## 2017-07-05 DIAGNOSIS — Z85828 Personal history of other malignant neoplasm of skin: Secondary | ICD-10-CM | POA: Diagnosis not present

## 2017-07-05 DIAGNOSIS — I129 Hypertensive chronic kidney disease with stage 1 through stage 4 chronic kidney disease, or unspecified chronic kidney disease: Secondary | ICD-10-CM | POA: Diagnosis not present

## 2017-07-05 DIAGNOSIS — Z823 Family history of stroke: Secondary | ICD-10-CM | POA: Diagnosis not present

## 2017-07-05 DIAGNOSIS — Z955 Presence of coronary angioplasty implant and graft: Secondary | ICD-10-CM

## 2017-07-05 DIAGNOSIS — K210 Gastro-esophageal reflux disease with esophagitis: Secondary | ICD-10-CM | POA: Insufficient documentation

## 2017-07-05 DIAGNOSIS — N183 Chronic kidney disease, stage 3 (moderate): Secondary | ICD-10-CM | POA: Diagnosis not present

## 2017-07-05 DIAGNOSIS — E039 Hypothyroidism, unspecified: Secondary | ICD-10-CM | POA: Insufficient documentation

## 2017-07-05 DIAGNOSIS — Z91040 Latex allergy status: Secondary | ICD-10-CM | POA: Insufficient documentation

## 2017-07-05 DIAGNOSIS — G4700 Insomnia, unspecified: Secondary | ICD-10-CM | POA: Insufficient documentation

## 2017-07-05 DIAGNOSIS — Z87891 Personal history of nicotine dependence: Secondary | ICD-10-CM | POA: Diagnosis not present

## 2017-07-05 DIAGNOSIS — I2584 Coronary atherosclerosis due to calcified coronary lesion: Secondary | ICD-10-CM | POA: Diagnosis not present

## 2017-07-05 DIAGNOSIS — I25119 Atherosclerotic heart disease of native coronary artery with unspecified angina pectoris: Secondary | ICD-10-CM | POA: Diagnosis not present

## 2017-07-05 DIAGNOSIS — I10 Essential (primary) hypertension: Secondary | ICD-10-CM | POA: Diagnosis present

## 2017-07-05 HISTORY — PX: RIGHT/LEFT HEART CATH AND CORONARY ANGIOGRAPHY: CATH118266

## 2017-07-05 LAB — BASIC METABOLIC PANEL
Anion gap: 9 (ref 5–15)
BUN: 18 mg/dL (ref 6–20)
CALCIUM: 9.3 mg/dL (ref 8.9–10.3)
CO2: 24 mmol/L (ref 22–32)
CREATININE: 1.1 mg/dL — AB (ref 0.44–1.00)
Chloride: 101 mmol/L (ref 101–111)
GFR calc non Af Amer: 43 mL/min — ABNORMAL LOW (ref >60)
GFR, EST AFRICAN AMERICAN: 50 mL/min — AB (ref >60)
Glucose, Bld: 100 mg/dL — ABNORMAL HIGH (ref 65–99)
Potassium: 4.3 mmol/L (ref 3.5–5.1)
SODIUM: 134 mmol/L — AB (ref 135–145)

## 2017-07-05 LAB — POCT I-STAT 3, ART BLOOD GAS (G3+)
BICARBONATE: 25.3 mmol/L (ref 20.0–28.0)
O2 Saturation: 99 %
PH ART: 7.368 (ref 7.350–7.450)
TCO2: 27 mmol/L (ref 22–32)
pCO2 arterial: 44 mmHg (ref 32.0–48.0)
pO2, Arterial: 138 mmHg — ABNORMAL HIGH (ref 83.0–108.0)

## 2017-07-05 LAB — POCT I-STAT 3, VENOUS BLOOD GAS (G3P V)
ACID-BASE DEFICIT: 1 mmol/L (ref 0.0–2.0)
Bicarbonate: 25 mmol/L (ref 20.0–28.0)
O2 SAT: 77 %
PCO2 VEN: 45.9 mmHg (ref 44.0–60.0)
TCO2: 26 mmol/L (ref 22–32)
pH, Ven: 7.343 (ref 7.250–7.430)
pO2, Ven: 44 mmHg (ref 32.0–45.0)

## 2017-07-05 SURGERY — RIGHT/LEFT HEART CATH AND CORONARY ANGIOGRAPHY
Anesthesia: LOCAL

## 2017-07-05 MED ORDER — FENTANYL CITRATE (PF) 100 MCG/2ML IJ SOLN
INTRAMUSCULAR | Status: DC | PRN
  Administered 2017-07-05: 50 ug via INTRAVENOUS

## 2017-07-05 MED ORDER — SODIUM CHLORIDE 0.9 % IV SOLN
INTRAVENOUS | Status: AC
  Administered 2017-07-05: 11:00:00 via INTRAVENOUS

## 2017-07-05 MED ORDER — OXYCODONE HCL 5 MG PO TABS: 5.0000 mg | ORAL_TABLET | ORAL | Status: DC | PRN

## 2017-07-05 MED ORDER — SODIUM CHLORIDE 0.9% FLUSH: 3.0000 mL | INTRAVENOUS | Status: DC | PRN

## 2017-07-05 MED ORDER — ACETAMINOPHEN 325 MG PO TABS: 650.0000 mg | ORAL_TABLET | ORAL | Status: DC | PRN

## 2017-07-05 MED ORDER — HEPARIN SODIUM (PORCINE) 1000 UNIT/ML IJ SOLN
INTRAMUSCULAR | Status: DC | PRN
  Administered 2017-07-05: 4000 [IU] via INTRAVENOUS

## 2017-07-05 MED ORDER — HEPARIN (PORCINE) IN NACL 1000-0.9 UT/500ML-% IV SOLN
INTRAVENOUS | Status: AC
  Filled 2017-07-05: qty 1000

## 2017-07-05 MED ORDER — FENTANYL CITRATE (PF) 100 MCG/2ML IJ SOLN
INTRAMUSCULAR | Status: AC
  Filled 2017-07-05: qty 2

## 2017-07-05 MED ORDER — SODIUM CHLORIDE 0.9% FLUSH: 3.0000 mL | Freq: Two times a day (BID) | INTRAVENOUS | Status: DC

## 2017-07-05 MED ORDER — VERAPAMIL HCL 2.5 MG/ML IV SOLN
INTRAVENOUS | Status: DC | PRN
  Administered 2017-07-05: 10 mL via INTRA_ARTERIAL

## 2017-07-05 MED ORDER — MIDAZOLAM HCL 2 MG/2ML IJ SOLN
INTRAMUSCULAR | Status: DC | PRN
  Administered 2017-07-05 (×2): 1 mg via INTRAVENOUS

## 2017-07-05 MED ORDER — HEPARIN (PORCINE) IN NACL 2-0.9 UNITS/ML
INTRAMUSCULAR | Status: AC | PRN
  Administered 2017-07-05 (×2): 500 mL via INTRA_ARTERIAL

## 2017-07-05 MED ORDER — HEPARIN SODIUM (PORCINE) 1000 UNIT/ML IJ SOLN
INTRAMUSCULAR | Status: AC
  Filled 2017-07-05: qty 1

## 2017-07-05 MED ORDER — LIDOCAINE HCL (PF) 1 % IJ SOLN
INTRAMUSCULAR | Status: AC
  Filled 2017-07-05: qty 30

## 2017-07-05 MED ORDER — IOHEXOL 350 MG/ML SOLN
INTRAVENOUS | Status: DC | PRN
  Administered 2017-07-05: 100 mL via INTRA_ARTERIAL

## 2017-07-05 MED ORDER — VERAPAMIL HCL 2.5 MG/ML IV SOLN
INTRAVENOUS | Status: AC
  Filled 2017-07-05: qty 2

## 2017-07-05 MED ORDER — SODIUM CHLORIDE 0.9 % IV SOLN: 250.0000 mL | INTRAVENOUS | Status: DC | PRN

## 2017-07-05 MED ORDER — ONDANSETRON HCL 4 MG/2ML IJ SOLN: 4.0000 mg | Freq: Four times a day (QID) | INTRAMUSCULAR | Status: DC | PRN

## 2017-07-05 MED ORDER — SODIUM CHLORIDE 0.9 % IV SOLN: INTRAVENOUS | Status: DC

## 2017-07-05 MED ORDER — LIDOCAINE HCL (PF) 1 % IJ SOLN
INTRAMUSCULAR | Status: DC | PRN
  Administered 2017-07-05 (×2): 2 mL via INTRADERMAL

## 2017-07-05 MED ORDER — ASPIRIN 81 MG PO CHEW: 81.0000 mg | CHEWABLE_TABLET | ORAL | Status: DC

## 2017-07-05 MED ORDER — MIDAZOLAM HCL 2 MG/2ML IJ SOLN
INTRAMUSCULAR | Status: AC
  Filled 2017-07-05: qty 2

## 2017-07-05 SURGICAL SUPPLY — 17 items
BAND ZEPHYR COMPRESS 30 LONG (HEMOSTASIS) ×2 IMPLANT
CATH BALLN WEDGE 5F 110CM (CATHETERS) ×2 IMPLANT
CATH INFINITI 5 FR JL3.5 (CATHETERS) ×2 IMPLANT
CATH INFINITI JR4 5F (CATHETERS) ×2 IMPLANT
COVER PRB 48X5XTLSCP FOLD TPE (BAG) ×1 IMPLANT
COVER PROBE 5X48 (BAG) ×1
GLIDESHEATH SLEND A-KIT 6F 22G (SHEATH) ×2 IMPLANT
GUIDEWIRE .025 260CM (WIRE) ×2 IMPLANT
GUIDEWIRE INQWIRE 1.5J.035X260 (WIRE) ×1 IMPLANT
INQWIRE 1.5J .035X260CM (WIRE) ×2
KIT HEART LEFT (KITS) ×2 IMPLANT
PACK CARDIAC CATHETERIZATION (CUSTOM PROCEDURE TRAY) ×2 IMPLANT
SHEATH GLIDE SLENDER 4/5FR (SHEATH) ×2 IMPLANT
TRANSDUCER W/STOPCOCK (MISCELLANEOUS) ×2 IMPLANT
TUBING CIL FLEX 10 FLL-RA (TUBING) ×2 IMPLANT
WIRE EMERALD ST .035X150CM (WIRE) ×2 IMPLANT
WIRE HI TORQ VERSACORE-J 145CM (WIRE) ×2 IMPLANT

## 2017-07-05 NOTE — Research (Signed)
CADFEM Informed Consent   Subject Name: [name]  Subject met inclusion and exclusion criteria.  The informed consent form, study requirements and expectations were reviewed with the subject and questions and concerns were addressed prior to the signing of the consent form.  The subject verbalized understanding of the trail requirements.  The subject agreed to participate in the CADFEM trial and signed the informed consent.  The informed consent was obtained prior to performance of any protocol-specific procedures for the subject.  A copy of the signed informed consent was given to the subject and a copy was placed in the subject's medical record.  [name] 07/05/2017, 11:02 AM

## 2017-07-05 NOTE — Interval H&P Note (Signed)
Cath Lab Visit (complete for each Cath Lab visit)  Clinical Evaluation Leading to the Procedure:   ACS: No.  Non-ACS:    Anginal Classification: CCS III  Anti-ischemic medical therapy: Minimal Therapy (1 class of medications)  Non-Invasive Test Results: No non-invasive testing performed  Prior CABG: No previous CABG      History and Physical Interval Note:  07/05/2017 3:25 PM  [name]  has presented today for surgery, with the diagnosis of aortic stenosis  The various methods of treatment have been discussed with the patient and family. After consideration of risks, benefits and other options for treatment, the patient has consented to  Procedure(s): RIGHT/LEFT HEART CATH AND CORONARY ANGIOGRAPHY (N/A) as a surgical intervention .  The patient's history has been reviewed, patient examined, no change in status, stable for surgery.  I have reviewed the patient's chart and labs.  Questions were answered to the patient's satisfaction.     Belva Crome III

## 2017-07-05 NOTE — Discharge Instructions (Signed)
**Note -identified via Obfuscation** Radial Site Care °Refer to this sheet in the next few weeks. These instructions provide you with information about caring for yourself after your procedure. Your health care provider may also give you more specific instructions. Your treatment has been planned according to current medical practices, but problems sometimes occur. Call your health care provider if you have any problems or questions after your procedure. °What can I expect after the procedure? °After your procedure, it is typical to have the following: °· Bruising at the radial site that usually fades within 1-2 weeks. °· Blood collecting in the tissue (hematoma) that may be painful to the touch. It should usually decrease in size and tenderness within 1-2 weeks. ° °Follow these instructions at home: °· Take medicines only as directed by your health care provider. °· You may shower 24-48 hours after the procedure or as directed by your health care provider. Remove the bandage (dressing) and gently wash the site with plain soap and water. Pat the area dry with a clean towel. Do not rub the site, because this may cause bleeding. °· Do not take baths, swim, or use a hot tub until your health care provider approves. °· Check your insertion site every day for redness, swelling, or drainage. °· Do not apply powder or lotion to the site. °· Do not flex or bend the affected arm for 24 hours or as directed by your health care provider. °· Do not push or pull heavy objects with the affected arm for 24 hours or as directed by your health care provider. °· Do not lift over 10 lb (4.5 kg) for 5 days after your procedure or as directed by your health care provider. °· Ask your health care provider when it is okay to: °? Return to work or school. °? Resume usual physical activities or sports. °? Resume sexual activity. °· Do not drive home if you are discharged the same day as the procedure. Have someone else drive you. °· You may drive 24 hours after the procedure  unless otherwise instructed by your health care provider. °· Do not operate machinery or power tools for 24 hours after the procedure. °· If your procedure was done as an outpatient procedure, which means that you went home the same day as your procedure, a responsible adult should be with you for the first 24 hours after you arrive home. °· Keep all follow-up visits as directed by your health care provider. This is important. °Contact a health care provider if: °· You have a fever. °· You have chills. °· You have increased bleeding from the radial site. Hold pressure on the site. °Get help right away if: °· You have unusual pain at the radial site. °· You have redness, warmth, or swelling at the radial site. °· You have drainage (other than a small amount of blood on the dressing) from the radial site. °· The radial site is bleeding, and the bleeding does not stop after 30 minutes of holding steady pressure on the site. °· Your arm or hand becomes pale, cool, tingly, or numb. °This information is not intended to replace advice given to you by your health care provider. Make sure you discuss any questions you have with your health care provider. °Document Released: 04/03/2010 Document Revised: 08/07/2015 Document Reviewed: 09/17/2013 °[employer] Interactive Patient Education © 2018 [employer] ° °

## 2017-07-06 ENCOUNTER — Encounter (HOSPITAL_COMMUNITY): Payer: Self-pay | Admitting: Interventional Cardiology

## 2017-07-06 MED FILL — Heparin Sod (Porcine)-NaCl IV Soln 1000 Unit/500ML-0.9%: INTRAVENOUS | Qty: 1000 | Status: AC

## 2017-07-12 ENCOUNTER — Other Ambulatory Visit: Payer: Self-pay

## 2017-07-12 DIAGNOSIS — I350 Nonrheumatic aortic (valve) stenosis: Secondary | ICD-10-CM

## 2017-07-19 ENCOUNTER — Other Ambulatory Visit: Payer: Self-pay

## 2017-07-19 DIAGNOSIS — N289 Disorder of kidney and ureter, unspecified: Secondary | ICD-10-CM

## 2017-07-19 DIAGNOSIS — I350 Nonrheumatic aortic (valve) stenosis: Secondary | ICD-10-CM

## 2017-07-25 ENCOUNTER — Ambulatory Visit (HOSPITAL_COMMUNITY)
Admission: RE | Admit: 2017-07-25 | Discharge: 2017-07-25 | Disposition: A | Discharge status: Home / Self Care | Payer: Medicare Other | Source: Ambulatory Visit | Attending: Cardiovascular Disease | Admitting: Cardiovascular Disease

## 2017-07-25 ENCOUNTER — Ambulatory Visit (HOSPITAL_BASED_OUTPATIENT_CLINIC_OR_DEPARTMENT_OTHER)
Admission: RE | Admit: 2017-07-25 | Discharge: 2017-07-25 | Disposition: A | Discharge status: Home / Self Care | Payer: Medicare Other | Source: Ambulatory Visit | Attending: Cardiovascular Disease | Admitting: Cardiovascular Disease

## 2017-07-25 ENCOUNTER — Encounter (HOSPITAL_COMMUNITY): Payer: Self-pay

## 2017-07-25 DIAGNOSIS — K439 Ventral hernia without obstruction or gangrene: Secondary | ICD-10-CM | POA: Insufficient documentation

## 2017-07-25 DIAGNOSIS — I350 Nonrheumatic aortic (valve) stenosis: Secondary | ICD-10-CM

## 2017-07-25 DIAGNOSIS — I517 Cardiomegaly: Secondary | ICD-10-CM | POA: Diagnosis not present

## 2017-07-25 DIAGNOSIS — I700 Atherosclerosis of aorta: Secondary | ICD-10-CM | POA: Insufficient documentation

## 2017-07-25 DIAGNOSIS — N289 Disorder of kidney and ureter, unspecified: Secondary | ICD-10-CM

## 2017-07-25 DIAGNOSIS — I2510 Atherosclerotic heart disease of native coronary artery without angina pectoris: Secondary | ICD-10-CM | POA: Insufficient documentation

## 2017-07-25 DIAGNOSIS — Z955 Presence of coronary angioplasty implant and graft: Secondary | ICD-10-CM | POA: Diagnosis not present

## 2017-07-25 HISTORY — DX: Essential (primary) hypertension: I10

## 2017-07-25 LAB — PULMONARY FUNCTION TEST
DL/VA % PRED: 98 %
DL/VA: 4.03 ml/min/mmHg/L
DLCO UNC % PRED: 75 %
DLCO UNC: 13.3 ml/min/mmHg
FEF 25-75 Pre: 1.18 L/sec
FEF2575-%Pred-Pre: 197 %
FEV1-%Pred-Pre: 112 %
FEV1-PRE: 1.25 L
FEV1FVC-%Pred-Pre: 108 %
FEV6-%PRED-PRE: 112 %
FEV6-Pre: 1.6 L
FEV6FVC-%PRED-PRE: 108 %
FVC-%PRED-PRE: 102 %
FVC-PRE: 1.6 L
PRE FEV6/FVC RATIO: 100 %
Pre FEV1/FVC ratio: 78 %
RV % PRED: 125 %
RV: 2.94 L
TLC % pred: 110 %
TLC: 4.77 L

## 2017-07-25 MED ORDER — METOPROLOL TARTRATE 5 MG/5ML IV SOLN: 5.0000 mg | Freq: Once | INTRAVENOUS | Status: DC

## 2017-07-25 MED ORDER — ALBUTEROL SULFATE (2.5 MG/3ML) 0.083% IN NEBU
2.5000 mg | INHALATION_SOLUTION | Freq: Once | RESPIRATORY_TRACT | Status: AC
  Administered 2017-07-25: 2.5 mg via RESPIRATORY_TRACT

## 2017-07-25 MED ORDER — IOPAMIDOL (ISOVUE-370) INJECTION 76%
INTRAVENOUS | Status: AC
  Filled 2017-07-25: qty 100

## 2017-07-25 MED ORDER — SODIUM BICARBONATE BOLUS VIA INFUSION
INTRAVENOUS | Status: AC
  Administered 2017-07-25: 75 meq via INTRAVENOUS
  Filled 2017-07-25: qty 1

## 2017-07-25 MED ORDER — SODIUM BICARBONATE 8.4 % IV SOLN
INTRAVENOUS | Status: AC
  Administered 2017-07-25: 13:00:00 via INTRAVENOUS
  Filled 2017-07-25: qty 500

## 2017-07-25 MED ORDER — IOPAMIDOL (ISOVUE-370) INJECTION 76%
100.0000 mL | Freq: Once | INTRAVENOUS | Status: AC | PRN
  Administered 2017-07-25: 100 mL via INTRAVENOUS

## 2017-07-25 MED ORDER — METOPROLOL TARTRATE 5 MG/5ML IV SOLN
INTRAVENOUS | Status: AC
  Administered 2017-07-25: 5 mg
  Filled 2017-07-25: qty 5

## 2017-07-25 NOTE — Progress Notes (Signed)
*  PRELIMINARY RESULTS* Vascular Ultrasound Carotid Duplex (Doppler) has been completed.   Findings suggest 1-39% internal carotid artery stenosis bilaterally. Vertebral arteries are patent with antegrade flow.  07/25/2017 4:34 PM Maudry Mayhew, BS, RVT, RDCS, RDMS

## 2017-07-27 ENCOUNTER — Encounter: Payer: Self-pay | Admitting: Surgery

## 2017-07-27 ENCOUNTER — Ambulatory Visit: Payer: Medicare Other | Attending: Cardiovascular Disease | Admitting: Physical Therapy

## 2017-07-27 ENCOUNTER — Encounter: Payer: Self-pay | Admitting: Physical Therapy

## 2017-07-27 ENCOUNTER — Institutional Professional Consult (permissible substitution) (INDEPENDENT_AMBULATORY_CARE_PROVIDER_SITE_OTHER): Payer: Medicare Other | Admitting: Surgery

## 2017-07-27 ENCOUNTER — Other Ambulatory Visit: Payer: Self-pay

## 2017-07-27 VITALS — BP 180/90 | HR 76 | Resp 16 | Ht 59.0 in

## 2017-07-27 DIAGNOSIS — I2510 Atherosclerotic heart disease of native coronary artery without angina pectoris: Secondary | ICD-10-CM | POA: Diagnosis not present

## 2017-07-27 DIAGNOSIS — R2689 Other abnormalities of gait and mobility: Secondary | ICD-10-CM | POA: Diagnosis present

## 2017-07-27 DIAGNOSIS — I350 Nonrheumatic aortic (valve) stenosis: Secondary | ICD-10-CM

## 2017-07-27 DIAGNOSIS — I25119 Atherosclerotic heart disease of native coronary artery with unspecified angina pectoris: Secondary | ICD-10-CM | POA: Diagnosis not present

## 2017-07-27 NOTE — Progress Notes (Signed)
Patient ID: [name], female   DOB: 14-Feb-1926, 84 y.o.   MRN: [id number]  [place] [facility] CONSULTATION REPORT  Referring Provider is Belva Crome, MD PCP is [name], MD  Chief Complaint  Patient presents with  . Aortic Stenosis    1ST TAVR EVAL    HPI:  The patient is a 84 year old woman with a history of hypertension, hyperlipidemia, hypothyroidism, coronary disease status post stenting of the right coronary artery, stage III chronic kidney disease, and aortic stenosis that has been followed by Dr. Tamala Julian.  Her echo in January 2016 showed a moderately calcified aortic valve with a mean gradient of 18 mmHg and a valve area of 0.8 cm with normal left ventricular systolic function and an ejection fraction of 55 to 60%.  She was felt to have moderate aortic stenosis and was asymptomatic.  She now reports a 23-month history of progressive exertional fatigue and dyspnea.  She says that she feels okay at rest although she is tired.  She gets short of breath with walking around in her apartment and has to sit down and rest after taking shower.  She has not had any chest pain or pressure.  She denies any dizziness or syncope.  She has had some intermittent lower extremity edema.  She had a follow-up echocardiogram on 06/08/2017 which showed a severely calcified trileaflet aortic valve with poor leaflet mobility.  The mean gradient was 27 mmHg with a dimensionless index of 0.25.  The aortic valve area was measured at 1.0 cm.  Left ventricular ejection fraction was 60 to 65% with grade 1 diastolic dysfunction.  Cardiac catheterization on 07/05/2017 showed a patent stent extending out of the right coronary ostium with about 70% narrowing.  The left main was widely patent.  The LAD had about 50 to 70% narrowing beyond the first diagonal branch and the first diagonal branch had about 90% stenosis.  The ostium of the left  circumflex had about 40 to 50% narrowing.  The peak to peak gradient across aortic valve was 30 mmHg.  Cardiac index was 4.23.  PA pressure was 40/18 with a mean wedge pressure of 17.  LVEDP was 18.  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  She has her own apartment which she cares for and still drives.  She said that her exertional fatigue and shortness of breath has markedly limited her ability to participate in any activities at her retirement community.  Past Medical History:  Diagnosis Date  . Abnormality of gait   . Acute sinusitis   . Anxiety   . Aphthous ulcer   . Breast cancer ([place])   . CAD (coronary artery disease)   . Chronic kidney disease, stage III (moderate) (HCC)   . Chronic sinusitis   . Depressive disorder   . Edema   . HA (headache)   . Hyperlipemia   . Hypertension   . Hypothyroid   . Insomnia   . Memory loss   . Osteoarthritis   . Pain in joint, site unspecified   . PNA (pneumonia)   . Reflux esophagitis    chronic    Past Surgical History:  Procedure Laterality Date  . ANGIOPLASTY    . BREAST LUMPECTOMY     x2  . GALLBLADDER SURGERY    .  REPLACEMENT TOTAL KNEE BILATERAL    . RIGHT/LEFT HEART CATH AND CORONARY ANGIOGRAPHY N/A 07/05/2017   Procedure: RIGHT/LEFT HEART CATH AND CORONARY ANGIOGRAPHY;  Surgeon: Belva Crome, MD;  Location: [facility];  Service: Cardiovascular;  Laterality: N/A;  . SKIN CANCER EXCISION  2018   right nostril   . TOTAL ABDOMINAL HYSTERECTOMY      Family History  Problem Relation Age of Onset  . Heart disease Father   . Stroke Mother   . Heart disease Maternal Grandfather   . Breast cancer Sister     Social History   Socioeconomic History  . Marital status: Widowed    Spouse name: Not on file  . Number of children: 3  . Years of education: Not on file  .  Highest education level: Not on file  Occupational History  . 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 Financial resource strain: Not on file  . Food insecurity:    Worry: Not on file    Inability: Not on file  . Transportation needs:    Medical: Not on file    Non-medical: Not on file  Tobacco Use  . Smoking status: Former Smoker    Packs/day: 0.50    Years: 20.00    Pack years: 10.00    Types: Cigarettes    Last attempt to quit: 03/15/1964    Years since quitting: 53.4  . Smokeless tobacco: Never Used  Substance and Sexual Activity  . Alcohol use: Yes    Comment: social  . Drug use: No  . Sexual activity: Not on file  Lifestyle  . Physical activity:    Days per week: Not on file    Minutes per session: Not on file  . Stress: Not on file  Relationships  . Social connections:    Talks on phone: Not on file    Gets together: Not on file    Attends religious service: Not on file    Active member of club or organization: Not on file    Attends meetings of clubs or organizations: Not on file    Relationship status: Not on file  . Intimate partner violence:    Fear of current or ex partner: Not on file    Emotionally abused: Not on file    Physically abused: Not on file    Forced sexual activity: Not on file  Other Topics Concern  . Not on file  Social History Narrative  . Not on file    Current Outpatient Medications  Medication Sig Dispense Refill  . acetaminophen (TYLENOL) 500 MG tablet Take 1,000 mg by mouth daily as needed (sinus headaches).    . ALPRAZolam (XANAX) 0.25 MG tablet Take 0.25 mg by mouth 3 (three) times daily as needed for anxiety or sleep.     Marland Kitchen buPROPion (WELLBUTRIN SR) 150 MG 12 hr tablet Take 150 mg by mouth daily before breakfast.     . citalopram (CELEXA) 10 MG tablet Take 10 mg by mouth at bedtime.     . Cyanocobalamin (B-12 PO) Take 1 Dose by mouth 2 (two) times a week.    . eszopiclone (LUNESTA) 2 MG  TABS tablet Take 1-2 mg by mouth at bedtime.     Marland Kitchen HYDROcodone-acetaminophen (NORCO/VICODIN) 5-325 MG tablet Take 1-2 tablets by mouth every 6 (six) hours as needed for moderate pain.    Marland Kitchen levothyroxine (SYNTHROID, LEVOTHROID) 50 MCG tablet Take 50 mcg by mouth once daily    .  metoprolol succinate (TOPROL-XL) 25 MG 24 hr tablet Take 25 mg by mouth once daily    . Multiple Vitamins-Minerals (MULTIVITAMIN PO) Take 1 tablet by mouth daily.    Marland Kitchen olmesartan-hydrochlorothiazide (BENICAR HCT) 40-12.5 MG tablet Take 1 tablet by mouth daily.    . Cyanocobalamin (B-12 COMPLIANCE INJECTION IJ) Inject 1 Dose as directed every 30 (thirty) days.    Marland Kitchen ibuprofen (ADVIL,MOTRIN) 200 MG tablet Take 400 mg by mouth daily as needed for headache or moderate pain.     No current facility-administered medications for this visit.     Allergies  Allergen Reactions  . Betadine [Povidone Iodine]     blisters  . Latex Swelling    hives  . Zestril [Lisinopril]     cough      Review of Systems:   General:  normal appetite, decreased energy, no weight gain, no weight loss, no fever  Cardiac:  no chest pain with exertion, no chest pain at rest, +SOB with mild exertion, no resting SOB, no PND, no orthopnea, no palpitations, no arrhythmia, no atrial fibrillation, occasional LE edema, no dizzy spells, no syncope  Respiratory:  +exertional shortness of breath, no home oxygen, no productive cough, no dry cough, no bronchitis, + wheezing, no hemoptysis, no asthma, no pain with inspiration or cough, no sleep apnea, no CPAP at night  GI:   no difficulty swallowing, no reflux, no frequent heartburn, no hiatal hernia, no abdominal pain, no constipation, + diarrhea, no hematochezia, no hematemesis, no melena  GU:   no dysuria,  no frequency, hx of urinary tract infection, no hematuria,  no kidney stones, + chronic kidney disease  Vascular:  no pain suggestive of claudication, no pain in feet, no leg cramps, + varicose veins, no  DVT, no non-healing foot ulcer  Neuro:   no stroke, no TIA's, no seizures, no headaches, no temporary blindness one eye,  no slurred speech, no peripheral neuropathy, no chronic pain, some instability of gait due to DJD and uses rolling walker for balance, no memory/cognitive dysfunction  Musculoskeletal: + arthritis, no joint swelling, no myalgias, some difficulty walking, reduced mobility   Skin:   no rash, no itching, no skin infections, no pressure sores or ulcerations  Psych:   + anxiety, no depression, no nervousness, no unusual recent stress  Eyes:   + blurry vision, no floaters, no recent vision changes, + wears glasses   ENT:   + hearing loss, no loose or painful teeth, no dentures, last saw dentist March 2019  Hematologic:  no easy bruising, no abnormal bleeding, no clotting disorder, no frequent epistaxis  Endocrine:  no diabetes, does not check CBG's at home       Physical Exam:   BP (!) 180/90 (BP Location: Left Arm, Patient Position: Sitting, Cuff Size: Large)   Pulse 76   Resp 16   Ht 4\' 11"  (1.499 m)   SpO2 96% Comment: ON RA  BMI 38.38 kg/m   General:  Elderly but  well-appearing  HEENT:  Unremarkable, NCAT, PERLA, EOMI, oropharynx clear, teeth in good condition  Neck:   no JVD, no bruits, no adenopathy or thyromegaly  Chest:   clear to auscultation, symmetrical breath sounds, no wheezes, no rhonchi   CV:   RRR, grade III/VI crescendo/decrescendo murmur heard best at RSB,  no diastolic murmur  Abdomen:  soft, non-tender, no masses or organomegaly  Extremities:  warm, well-perfused, pulses palpable in feet, mild LE edema  Rectal/GU  Deferred  Neuro:  Grossly non-focal and symmetrical throughout  Skin:   Clean and dry, no rashes, no breakdown   Diagnostic Tests:      [name]                        [address], [place] [address]                             [phone]  ------------------------------------------------------------------- Transthoracic Echocardiography  Patient:    [name], [name] MR #:       [id number] Study Date: 06/08/2017 Gender:     F Age:        15 Height:     151.1 cm Weight:     86.2 kg BSA:        1.95 m^2 Pt. Status: Room:   Ivar Bury, MD  Strawn W. Smith, MD  ATTENDING    Loralie Champagne, M.D.  SONOGRAPHER  Wyatt Mage, RDCS  PERFORMING   [facility], Outpatient  cc:  ------------------------------------------------------------------- LV EF: 60% -   65%  ------------------------------------------------------------------- Indications:      Aortic stenosis (I35).  ------------------------------------------------------------------- History:   PMH:   Dyspnea.  Aortic valve disease.  Risk factors: Former tobacco use. Hypertension. Dyslipidemia.  ------------------------------------------------------------------- Study Conclusions  - Left ventricle: The cavity size was normal. Wall thickness was   increased in a pattern of mild LVH. There was moderate focal   basal hypertrophy of the septum. Systolic function was normal.   The estimated ejection fraction was in the range of 60% to 65%.   Doppler parameters are consistent with abnormal left ventricular   relaxation (grade 1 diastolic dysfunction). The E/e&' ratio is   >15, suggesting elevated LV filling pressure. - Aortic valve: Moderate to severe aortic stenosis. Trivial   regurgitation. Mean gradient (S): 27 mm Hg. Peak gradient (S): 52   mm Hg. Valve area (VTI): 1.03 cm^2. Valve area (Vmax): 1.05 cm^2.   Valve area (Vmean): 1.02 cm^2. - Mitral valve: Calcified annulus. Mildly thickened leaflets .   There was mild regurgitation. - Right atrium: The atrium was mildly dilated. - Tricuspid valve: There was mild regurgitation. - Pulmonary arteries: PA peak pressure: 49 mm Hg (S). - Inferior vena cava: The vessel was  normal in size. The   respirophasic diameter changes were in the normal range (= 50%),   consistent with normal central venous pressure.  Impressions:  - Compared to a prior study in 2016, the LVEF is higher at 60-65%.   There is now moderate to severe aortic stenosis with mean   gradient of 27 mmHg - AVA around 1.0-1.1 cm2.  ------------------------------------------------------------------- Labs, prior tests, procedures, and surgery: Transthoracic echocardiography (03/27/2014).     EF was 55% and PA pressure was 53 (systolic).  ------------------------------------------------------------------- Study data:  Comparison was made to the study of 03/27/2014.  Study status:  Routine.  Procedure:  The patient reported no pain pre or post test. Transthoracic echocardiography. Image quality was adequate.  Study completion:  There were no complications. Transthoracic echocardiography.  M-mode, complete 2D, spectral Doppler, and color Doppler.  Birthdate:  Patient birthdate: 04-12-1925.  Age:  Patient is 84 yr old.  Sex:  Gender: female. BMI: 37.8 kg/m^2.  Blood pressure:     180/96  Patient status: Outpatient.  Study date:  Study date: 06/08/2017. Study time: 02:06 PM.  Location:  [facility]  -------------------------------------------------------------------  ------------------------------------------------------------------- Left ventricle:  The cavity size was normal. Wall thickness was increased in a pattern of mild LVH. There was moderate focal basal hypertrophy of the septum. Systolic function was normal. The estimated ejection fraction was in the range of 60% to 65%. Doppler parameters are consistent with abnormal left ventricular relaxation (grade 1 diastolic dysfunction). The E/e&' ratio is >15, suggesting elevated LV filling pressure.  ------------------------------------------------------------------- Aortic valve:  Moderate to severe aortic stenosis.  Trivial regurgitation.  Doppler:     VTI ratio of LVOT to aortic valve: 0.25. Valve area (VTI): 1.03 cm^2. Indexed valve area (VTI): 0.53 cm^2/m^2. Peak velocity ratio of LVOT to aortic valve: 0.25. Valve area (Vmax): 1.05 cm^2. Indexed valve area (Vmax): 0.54 cm^2/m^2. Mean velocity ratio of LVOT to aortic valve: 0.24. Valve area (Vmean): 1.02 cm^2. Indexed valve area (Vmean): 0.52 cm^2/m^2. Mean gradient (S): 27 mm Hg. Peak gradient (S): 52 mm Hg.  ------------------------------------------------------------------- Aorta:  Aortic root: The aortic root was normal in size. Ascending aorta: The ascending aorta was normal in size.  ------------------------------------------------------------------- Mitral valve:   Calcified annulus. Mildly thickened leaflets . Doppler:  There was mild regurgitation.    Valve area by pressure half-time: 2.37 cm^2. Indexed valve area by pressure half-time: 1.22 cm^2/m^2. Valve area by continuity equation (using LVOT flow): 1.59 cm^2. Indexed valve area by continuity equation (using LVOT flow): 0.82 cm^2/m^2.    Mean gradient (D): 4 mm Hg. Peak gradient (D): 9 mm Hg.  ------------------------------------------------------------------- Left atrium:  The atrium was normal in size.  ------------------------------------------------------------------- Atrial septum:  No defect or patent foramen ovale was identified.   ------------------------------------------------------------------- Right ventricle:  The cavity size was normal. Wall thickness was normal. Systolic function was normal.  ------------------------------------------------------------------- Pulmonic valve:    The valve appears to be grossly normal. Doppler:  There was trivial regurgitation.  ------------------------------------------------------------------- Tricuspid valve:   Doppler:  There was mild  regurgitation.  ------------------------------------------------------------------- Pulmonary artery:   The main pulmonary artery was normal-sized.  ------------------------------------------------------------------- Right atrium:  The atrium was mildly dilated.  ------------------------------------------------------------------- Pericardium:  There was no pericardial effusion.  ------------------------------------------------------------------- Systemic veins: Inferior vena cava: The vessel was normal in size. The respirophasic diameter changes were in the normal range (= 50%), consistent with normal central venous pressure.  ------------------------------------------------------------------- Measurements   Left ventricle                            Value          Reference  LV ID, ED, PLAX chordal           (L)     36    mm       43 - 52  LV ID, ES, PLAX chordal                   28    mm       23 - 38  LV fx shortening, PLAX chordal    (L)     22    %        >=29  LV PW thickness, ED                       13    mm       ---------  IVS/LV PW ratio, ED                       1.08           <=1.3  Stroke volume, 2D                         78    ml       ---------  Stroke volume/bsa, 2D                     40    ml/m^2   ---------  LV e&', lateral                            7.4   cm/s     ---------  LV E/e&', lateral                          20.68          ---------  LV e&', medial                             6.09  cm/s     ---------  LV E/e&', medial                           25.12          ---------  LV e&', average                            6.75  cm/s     ---------  LV E/e&', average                          22.68          ---------    Ventricular septum                        Value          Reference  IVS thickness, ED                         14    mm       ---------    LVOT                                      Value          Reference  LVOT ID, S                                 23    mm       ---------  LVOT area                                 4.15  cm^2     ---------  LVOT peak velocity, S                     91.7  cm/s     ---------  LVOT mean velocity, S                     59.4  cm/s     ---------  LVOT VTI, S                               22.5  cm       ---------  Stroke volume (SV), LVOT DP               93.5  ml       ---------  Stroke index (SV/bsa), LVOT DP            48    ml/m^2   ---------    Aortic valve                              Value          Reference  Aortic valve peak velocity, S             362   cm/s     ---------  Aortic valve mean velocity, S             243   cm/s     ---------  Aortic valve VTI, S                       90.7  cm       ---------  Aortic mean gradient, S                   27    mm Hg    ---------  Aortic peak gradient, S                   52    mm Hg    ---------  VTI ratio, LVOT/AV                        0.25           ---------  Aortic valve area, VTI                    1.03  cm^2     ---------  Aortic valve area/bsa, VTI                0.53  cm^2/m^2 ---------  Velocity ratio, peak, LVOT/AV             0.25           ---------  Aortic valve area, peak velocity          1.05  cm^2     ---------  Aortic valve area/bsa, peak               0.54  cm^2/m^2 ---------  velocity  Velocity ratio, mean, LVOT/AV             0.24           ---------  Aortic valve area, mean velocity          1.02  cm^2     ---------  Aortic valve area/bsa, mean               0.52  cm^2/m^2 ---------  velocity  Aortic regurg pressure half-time          359   ms       ---------  Aorta                                     Value          Reference  Aortic root ID, ED                        34    mm       ---------  Ascending aorta ID, A-P, S                30    mm       ---------    Left atrium                               Value          Reference  LA ID, A-P, ES                            38    mm       ---------  LA ID/bsa, A-P                             1.95  cm/m^2   <=2.2  LA volume, S                              61.5  ml       ---------  LA volume/bsa, S                          31.6  ml/m^2   ---------  LA volume, ES, 1-p A4C                    50.4  ml       ---------  LA volume/bsa, ES, 1-p A4C                25.9  ml/m^2   ---------  LA volume, ES, 1-p A2C                    68.9  ml       ---------  LA volume/bsa, ES, 1-p A2C                35.3  ml/m^2   ---------    Mitral valve                              Value          Reference  Mitral E-wave peak velocity               153   cm/s     ---------  Mitral A-wave peak velocity               139   cm/s     ---------  Mitral mean velocity, D                   94.4  cm/s     ---------  Mitral deceleration time          (H)  285   ms       150 - 230  Mitral pressure half-time                 102   ms       ---------  Mitral mean gradient, D                   4     mm Hg    ---------  Mitral peak gradient, D                   9     mm Hg    ---------  Mitral E/A ratio, peak                    1.1            ---------  Mitral valve area, PHT, DP                2.37  cm^2     ---------  Mitral valve area/bsa, PHT, DP            1.22  cm^2/m^2 ---------  Mitral valve area, LVOT                   1.59  cm^2     ---------  continuity  Mitral valve area/bsa, LVOT               0.82  cm^2/m^2 ---------  continuity  Mitral annulus VTI, D                     49.1  cm       ---------    Pulmonary arteries                        Value          Reference  PA pressure, S, DP                (H)     49    mm Hg    <=30    Tricuspid valve                           Value          Reference  Tricuspid regurg peak velocity            319   cm/s     ---------  Tricuspid peak RV-RA gradient             41    mm Hg    ---------    Systemic veins                            Value          Reference  Estimated CVP                             8     mm Hg    ---------    Right  ventricle                           Value          Reference  TAPSE  26.1  mm       ---------  RV pressure, S, DP                (H)     49    mm Hg    <=30  RV s&', lateral, S                         10.7  cm/s     ---------  Legend: (L)  and  (H)  mark values outside specified reference range.  ------------------------------------------------------------------- Prepared and Electronically Authenticated by  Lyman Bishop MD 2019-03-27T15:44:40   Physicians   Panel Physicians Referring Physician Case Authorizing Physician  Belva Crome, MD (Primary)    Procedures   RIGHT/LEFT HEART CATH AND CORONARY ANGIOGRAPHY  Conclusion    Calcified thoracic, arch, and descending aorta  RCA has stent extending into the ascending aortic root.  There is ostial 70% narrowing.  The RCA is dominant without any significant residual disease.  Left main is widely patent.  LAD beyond the first diagonal contains eccentric 50-70% narrowing.  The first diagonal contains a branch that has 90% stenosis.  Circumflex ostium contains 40-50% narrowing.  3 relatively small obtuse marginal branches are widely patent.  Mild pulmonary hypertension.  Moderate aortic stenosis based upon hemodynamics with calculated aortic valve area 1.3 cm square.  Peak to peak gradient 30 mmHg.  Possible stage D3 severe aortic stenosis.  RECOMMENDATIONS:   Follow-up with Dr. Angelena Form to determine further steps towards consideration of percutaneous aortic valve replacement.    Indications   Aortic stenosis, severe [I35.0 (ICD-10-CM)]  CAD in native artery [I25.10 (ICD-10-CM)]  Procedural Details/Technique   Technical Details The right radial area was sterilely prepped and draped. Intravenous sedation with Versed and fentanyl was administered. 1% Xylocaine was infiltrated to achieve local analgesia. Using real-time vascular ultrasound, a double wall stick with an angiocath was  utilized to obtain intra-arterial access. A VUS image was saved for the permanent record.The modified Seldinger technique was used to place a 11F " Slender" sheath in the right radial artery. Weight based heparin was administered. Coronary angiography was done using 5 F catheters. Right coronary angiography was performed with a JR4. Left ventricular hemodymic recordings and angiography was done using the JR 4 catheter and hand injection. Left coronary angiography was performed with a JL 3.5 cm.  Right heart catheterization was performed by exchanging a previously placed antecubital IV angio-cath for a 5 French Slender sheath. 1% Xylocaine was used to locally nesthetize the area around the IV site. The IV catheter was wired using an .018 guidewire. The modified Seldinger technique was used to place the 5 [place] sheath. Double glove technique was used to enhance sterility. After sheath insertion, right heart cath was performed using a 5 French balloon tipped catheter and fluoroscopic guidance. Pressures were recorded in each chamber and in the pulmonary capillary wedge position.. The main pulmonary artery O2 saturation was sampled.   Hemostasis was achieved using a pneumatic band.  During this procedure the patient is administered a total of Versed 2 mg and Fentanyl 50 mg to achieve and maintain moderate conscious sedation. The patient's heart rate, blood pressure, and oxygen saturation are monitored continuously during the procedure. The period of conscious sedation is 46 minutes, of which I was present face-to-face 100% of this time.   Estimated blood loss <50 mL.  During this procedure the patient was administered the following to achieve and maintain  moderate conscious sedation: Versed 2 mg, Fentanyl 50 mcg, while the patient's heart rate, blood pressure, and oxygen saturation were continuously monitored. The period of conscious sedation was 46 minutes, of which I was present face-to-face 100% of this  time.  Coronary Findings   Diagnostic  Dominance: Right  Left Main  Dist LM to Ost LAD lesion 40% stenosed  Dist LM to Ost LAD lesion is 40% stenosed.  Left Anterior Descending  Prox LAD lesion 70% stenosed  Prox LAD lesion is 70% stenosed.  First Diagonal Branch  1st Diag lesion 85% stenosed  1st Diag lesion is 85% stenosed.  Left Circumflex  Ost Cx lesion 50% stenosed  Ost Cx lesion is 50% stenosed.  Right Coronary Artery  Ost RCA to Prox RCA lesion 70% stenosed  Ost RCA to Prox RCA lesion is 70% stenosed. The lesion was previously treated.  Mid RCA lesion 30% stenosed  Mid RCA lesion is 30% stenosed.  Intervention   No interventions have been documented.  Right Heart   Right Heart Pressures Hemodynamic findings consistent with mild pulmonary hypertension. Elevated LV EDP consistent with volume overload.  Coronary Diagrams   Diagnostic Diagram       Implants    No implant documentation for this case.  MERGE Images   Show images for CARDIAC CATHETERIZATION   Link to Procedure Log   Procedure Log    Hemo Data    Most Recent Value  Fick Cardiac Output 7.62 L/min  Fick Cardiac Output Index 4.23 (L/min)/BSA  Aortic Mean Gradient 28.2 mmHg  Aortic Peak Gradient 30 mmHg  Aortic Valve Area 1.33  Aortic Value Area Index 0.74 cm2/BSA  RA A Wave 10 mmHg  RA V Wave 8 mmHg  RA Mean 7 mmHg  RV Systolic Pressure 40 mmHg  RV Diastolic Pressure 4 mmHg  RV EDP 9 mmHg  PA Systolic Pressure 40 mmHg  PA Diastolic Pressure 18 mmHg  PA Mean 28 mmHg  PW A Wave 19 mmHg  PW V Wave 21 mmHg  PW Mean 17 mmHg  AO Systolic Pressure 676 mmHg  AO Diastolic Pressure 64 mmHg  AO Mean 195 mmHg  LV Systolic Pressure 093 mmHg  LV Diastolic Pressure 6 mmHg  LV EDP 18 mmHg  Arterial Occlusion Pressure Extended Systolic Pressure 267 mmHg  Arterial Occlusion Pressure Extended Diastolic Pressure 62 mmHg  Arterial Occlusion Pressure Extended Mean Pressure 102 mmHg  Left Ventricular  Apex Extended Systolic Pressure 124 mmHg  Left Ventricular Apex Extended Diastolic Pressure 5 mmHg  Left Ventricular Apex Extended EDP Pressure 17 mmHg  QP/QS 1  TPVR Index 6.61 HRUI  TSVR Index 24.32 HRUI  PVR SVR Ratio 0.11  TPVR/TSVR Ratio 0.27    ADDENDUM REPORT: 07/25/2017 17:34  CLINICAL DATA:  84 year old female with severe aortic stenosis being evaluated for a TAVR procedure.  EXAM: Cardiac TAVR CT  TECHNIQUE: The patient was scanned on a [employer]. A 120 kV retrospective scan was triggered in the descending thoracic aorta at 111 HU's. Gantry rotation speed was 250 msecs and collimation was .6 mm. 5 mg of iv Metoprolol and no nitro were given. The 3D data set was reconstructed in 5% intervals of the R-R cycle. Systolic and diastolic phases were analyzed on a dedicated work station using MPR, MIP and VRT modes. The patient received 80 cc of contrast.  FINDINGS: Aortic Valve: Trileaflet aortic valve with severely thickened and moderately calcified leaflets with severely reduced leaflets excursion. Only minimal calcifications are extending  into the LVOT.  Aorta: Normal size with mild calcifications in the ascending aorta but moderate to severe atherosclerosis and almost circumferential calcifications in the aortic arch and descending thoracic aorta. No dissection.  Sinotubular Junction: 26 x 25 mm  Ascending Thoracic Aorta: 30 x 29 mm  Aortic Arch: 20 x 20 mm  Descending Thoracic Aorta: 23 x 21 mm  Sinus of Valsalva Measurements:  Non-coronary: 30 mm  Right -coronary: 31 mm  Left -coronary: 32 mm  Coronary Artery Height above Annulus:  Left Main: 17 mm  Right Coronary: 18 mm  Virtual Basal Annulus Measurements:  Maximum/Minimum Diameter: 27.2 x 22.1 mm  Mean Diameter: 24.8 mm  Perimeter: 79.1 mm  Area: 484 mm2  Coronary Arteries: NTG not used and scan not sufficient for coronary evaluation, however right  ostial coronary stent is extending into the right coronary sinus (3.4 mm). However, RCA is 18 mm above the annulus.  Optimum Fluoroscopic Angle for Delivery: LAO 8 CAU 7  Dilated pulmonary artery measuring 31 x 24 mm  IMPRESSION: 1. Trileaflet aortic valve with severely thickened and moderately calcified leaflets with severely reduced leaflets excursion. Only minimal calcifications are extending into the LVOT. Annular measurements suitable for delivery of a 26 mm Edwards-SAPIEN 3 valve.  2. Sufficient coronary to annulus distance.  3. Optimum Fluoroscopic Angle for Delivery: LAO 8 CAU 7.  4. No thrombus in the left atrial appendage.  5. Normal size of the thoracic aorta with mild calcifications in the ascending aorta but moderate to severe atherosclerosis and almost circumferential calcifications in the aortic arch and descending thoracic aorta. No dissection.  6. Right ostial coronary stent is extending into the right coronary sinus (3.4 mm). However, RCA is 18 mm above the annulus and should not interfere with valve placement.   Electronically Signed   By: Ena Dawley   On: 07/25/2017 17:34   CLINICAL DATA:  84 year old female with history of severe aortic stenosis. Preprocedural study prior to potential transcatheter aortic valve replacement (TAVR) procedure.  EXAM: CT ANGIOGRAPHY CHEST, ABDOMEN AND PELVIS  TECHNIQUE: Multidetector CT imaging through the chest, abdomen and pelvis was performed using the standard protocol during bolus administration of intravenous contrast. Multiplanar reconstructed images and MIPs were obtained and reviewed to evaluate the vascular anatomy.  CONTRAST:  169mL ISOVUE-370 IOPAMIDOL (ISOVUE-370) INJECTION 76%  COMPARISON:  CT the chest, abdomen and pelvis 03/13/2013.  FINDINGS: CTA CHEST FINDINGS  Cardiovascular: Heart size is mildly enlarged. There is no significant pericardial fluid, thickening or  pericardial calcification. There is aortic atherosclerosis, as well as atherosclerosis of the great vessels of the mediastinum and the coronary arteries, including calcified atherosclerotic plaque in the left anterior descending and right coronary arteries. Severe thickening calcification of the aortic valve. Calcification of the mitral annulus.  Mediastinum/Lymph Nodes: No pathologically enlarged mediastinal or hilar lymph nodes. Patulous esophagus. No axillary lymphadenopathy.  Lungs/Pleura: No acute consolidative airspace disease. No pleural effusions. No suspicious appearing pulmonary nodules or masses.  Musculoskeletal/Soft Tissues: There are no aggressive appearing lytic or blastic lesions noted in the visualized portions of the skeleton.  CTA ABDOMEN AND PELVIS FINDINGS  Hepatobiliary: Subcentimeter low-attenuation lesions are noted in the liver, too small to characterize, but statistically likely to represent tiny cysts. No other larger more suspicious appearing hepatic lesions are noted. No intra or extrahepatic biliary ductal dilatation. Status post cholecystectomy.  Pancreas: No pancreatic mass. No pancreatic ductal dilatation. No pancreatic or peripancreatic fluid or inflammatory changes.  Spleen: Unremarkable.  Adrenals/Urinary Tract:  Multiple well-defined low-attenuation nonenhancing lesions in both kidneys are compatible with simple cysts, largest of which measures 5.4 cm in the interpolar region of the right kidney. There are several other subcentimeter low-attenuation lesions in both kidneys which are too small to definitively characterize, but are statistically likely to represent tiny cysts. Calcifications in the right adrenal gland, likely from prior infection or hemorrhage. Left adrenal gland is normal in appearance. No hydroureteronephrosis. Urinary bladder is normal in appearance.  Stomach/Bowel: The appearance of the stomach is normal. There  is no pathologic dilatation of small bowel or colon. Numerous colonic diverticulae are noted, without surrounding inflammatory changes to suggest an acute diverticulitis at this time. The appendix is not confidently identified and may be surgically absent. Regardless, there are no inflammatory changes noted adjacent to the cecum to suggest the presence of an acute appendicitis at this time.  Vascular/Lymphatic: Extensive aortic atherosclerosis, with vascular findings and measurements pertinent to potential TAVR procedure, as detailed below. Celiac axis, superior mesenteric artery and inferior mesenteric artery are all patent without definite hemodynamically significant stenosis. Single renal arteries are both patent without hemodynamically significant stenosis. No lymphadenopathy noted in the abdomen or pelvis.  Reproductive: Status post hysterectomy. Ovaries are not confidently identified may be surgically absent or atrophic.  Other: Small epigastric ventral hernia containing only fat. No significant volume of ascites. No pneumoperitoneum.  Musculoskeletal: There are no aggressive appearing lytic or blastic lesions noted in the visualized portions of the skeleton.  VASCULAR MEASUREMENTS PERTINENT TO TAVR:  AORTA:  Minimal Aortic Diameter-12 x 12 mm  Severity of Aortic Calcification-severe  RIGHT PELVIS:  Right Common Iliac Artery -  Minimal Diameter-12.1 x 9.7 mm  Tortuosity-mild  Calcification-moderate  Right External Iliac Artery -  Minimal Diameter-8.4 x 8.8 mm  Tortuosity-mild  Calcification-none  Right Common Femoral Artery -  Minimal Diameter-8.5 x 8.7 mm  Tortuosity - mild  Calcification-mild  LEFT PELVIS:  Left Common Iliac Artery -  Minimal Diameter-10.5 x 8.1 mm  Tortuosity - mild  Calcification-moderate  Left External Iliac Artery -  Minimal Diameter-8.1 x 7.8 mm  Tortuosity -  mild  Calcification-none  Left Common Femoral Artery -  Minimal Diameter-8.4 x 8.0 mm  Tortuosity - mild  Calcification-mild  Review of the MIP images confirms the above findings.  IMPRESSION: 1. Vascular findings and measurements pertinent to potential TAVR procedure, as detailed above. 2. Severe thickening calcification of the aortic valve, compatible with the reported clinical history of severe aortic stenosis. 3. Aortic atherosclerosis, in addition to 2 vessel coronary artery disease. 4. Mild cardiomegaly. 5. Small epigastric ventral hernia containing only omental fat. No associated bowel incarceration or obstruction at this time. 6. Additional incidental findings, as above.  Aortic Atherosclerosis (ICD10-I70.0).   Electronically Signed   By: Vinnie Langton M.D.   On: 07/25/2017 14:51   Impression:  This 84 year old woman has stage D, severe, symptomatic aortic stenosis with New York Heart Association class III symptoms of exertional fatigue and shortness of breath with low level activity.  Her symptoms have been progressive over the past 6 months and are now markedly limiting her activity.  I have personally reviewed her echocardiogram, cardiac cath, and CTA studies.  Her echocardiogram shows a trileaflet aortic valve that is severely calcified with markedly restricted leaflet mobility and looks like a severely stenotic aortic valve.  Her mean gradient is only 27 mmHg but her dimensionless index is 0.25 and given the appearance of her valve and her symptoms I think she  really has low gradient, normal EF, severe aortic stenosis.  Her cardiac catheterization shows moderate coronary artery disease with a stent in the ostium of the right coronary artery which is protruding slightly.  There is about 70% ostial RCA narrowing.  The patient has no anginal symptoms.  I think aortic valve replacement is indicated in this patient since she is still living independently and  would like to maintain her activity level which has been markedly limited by exertional fatigue and shortness of breath.  I think her operative risk would be at least moderately elevated for open surgical aortic valve replacement +/- coronary bypass graft surgery.  I think transcatheter aortic valve replacement would be the best option for this patient.  Her gated cardiac CTA shows anatomy that is suitable for transcatheter aortic valve replacement with no complicating features.  There is a stent slightly protruding from the RCA ostium but this is 18 mm above the annulus.  Her abdominal and pelvic CTA shows adequate pelvic arterial anatomy to allow transfemoral insertion.  Pulmonary function testing shows minimal obstructive disease and a minimal decrease in her diffusion capacity.  Carotid arterial Dopplers showed no stenosis.  The patient and her daughter were counseled at length regarding treatment alternatives for management of severe symptomatic aortic stenosis. The risks and benefits of surgical intervention has been discussed in detail. Long-term prognosis with medical therapy was discussed. Alternative approaches such as conventional surgical aortic valve replacement, transcatheter aortic valve replacement, and palliative medical therapy were compared and contrasted at length. This discussion was placed in the context of the patient's own specific clinical presentation and past medical history. All of their questions have been addressed.   Assuming that we proceed with transcatheter aortic valve replacement, a discussion was held regarding what types of management strategies would be attempted intraoperatively in the event of life-threatening complications, including whether or not the patient would be considered a candidate for the use of cardiopulmonary bypass and/or conversion to open sternotomy for attempted surgical intervention.   The patient has been advised of a variety of complications that  might develop including but not limited to risks of death, stroke, paravalvular leak, aortic dissection or other major vascular complications, aortic annulus rupture, device embolization, cardiac rupture or perforation, mitral regurgitation, acute myocardial infarction, arrhythmia, heart block or bradycardia requiring permanent pacemaker placement, congestive heart failure, respiratory failure, renal failure, pneumonia, infection, other late complications related to structural valve deterioration or migration, or other complications that might ultimately cause a temporary or permanent loss of functional independence or other long term morbidity. The patient provides full informed consent for the procedure as described and all questions were answered.    Plan:  The patient will have a physical therapy evaluation this afternoon and will return tomorrow for a second surgical evaluation with Dr. Roxy Manns.  If we decide to proceed with transcatheter aortic valve replacement she will tentatively be scheduled for 08/02/2017.  I spent 60 minutes performing this consultation and > 50% of this time was spent face to face counseling and coordinating the care of this patient's severe, symptomatic aortic stenosis.   Gaye Pollack, MD 07/27/2017 11:17 AM

## 2017-07-27 NOTE — Therapy (Signed)
[place], [place], [address] Phone: [phone]   Fax:  [phone]  Physical Therapy Evaluation  Patient Details  Name: [name] MRN: [id number] Date of Birth: June 11, 1925 Referring Provider: Dr Ian Bushman    Encounter Date: 07/27/2017  PT End of Session - 07/27/17 1247    Visit Number  1    Number of Visits  1    Authorization Type  Medicare     PT Start Time  1240    PT Stop Time  1315    PT Time Calculation (min)  35 min    Activity Tolerance  Patient tolerated treatment well    Behavior During Therapy  [facility] for tasks assessed/performed       Past Medical History:  Diagnosis Date  . Abnormality of gait   . Acute sinusitis   . Anxiety   . Aphthous ulcer   . Breast cancer ([place])   . CAD (coronary artery disease)   . Chronic kidney disease, stage III (moderate) (HCC)   . Chronic sinusitis   . Depressive disorder   . Edema   . HA (headache)   . Hyperlipemia   . Hypertension   . Hypothyroid   . Insomnia   . Memory loss   . Osteoarthritis   . Pain in joint, site unspecified   . PNA (pneumonia)   . Reflux esophagitis    chronic    Past Surgical History:  Procedure Laterality Date  . ANGIOPLASTY    . BREAST LUMPECTOMY     x2  . GALLBLADDER SURGERY    . REPLACEMENT TOTAL KNEE BILATERAL    . RIGHT/LEFT HEART CATH AND CORONARY ANGIOGRAPHY N/A 07/05/2017   Procedure: RIGHT/LEFT HEART CATH AND CORONARY ANGIOGRAPHY;  Surgeon: Belva Crome, MD;  Location: [facility];  Service: Cardiovascular;  Laterality: N/A;  . SKIN CANCER EXCISION  2018   right nostril   . TOTAL ABDOMINAL HYSTERECTOMY      There were no vitals filed for this visit.   Subjective Assessment - 07/27/17 1248    Subjective  Patient reports 6 months ago she began having shortness of breath. She feels her biggest problem at this time is her balance.     Pertinent History  Severe Aortic stenosis     Currently in Pain?  No/denies         [facility] PT Assessment - 07/27/17 0001      Assessment   Medical Diagnosis  Severe Aortic Stenois     Referring Provider  Dr Ian Bushman     Onset Date/Surgical Date  -- 6 months     Hand Dominance  Right    Next MD Visit  Tomorrow       Precautions   Precautions  Fall      Restrictions   Weight Bearing Restrictions  No      Balance Screen   Has the patient fallen in the past 6 months  Yes    How many times?  1    Has the patient had a decrease in activity level because of a fear of falling?   Yes    Is the patient reluctant to leave their home because of a fear of falling?   Yes      Home Environment   Additional Comments  No steps inot her house       Prior Function   Level of Independence  Independent with household mobility with  device      Cognition   Overall Cognitive Status  Within Functional Limits for tasks assessed    Attention  Focused    Focused Attention  Appears intact    Memory  Appears intact    Awareness  Appears intact    Problem Solving  Appears intact      Sensation   Additional Comments  Denies parathesias       Coordination   Gross Motor Movements are Fluid and Coordinated  Yes    Fine Motor Movements are Fluid and Coordinated  Yes      ROM / Strength   AROM / PROM / Strength  AROM;PROM;Strength      AROM   Overall AROM Comments  range of motion withing functional limites       PROM   Overall PROM Comments  PROM withing functional limits       Strength   Overall Strength Comments  Strength 5/5 gross but caused shortness of breath        [facility] Pre-Surgical Assessment - 07/27/17 0001    5 Meter Walk Test- trial 1  13 sec    5 Meter Walk Test- trial 2  11 sec.     5 Meter Walk Test- trial 3  10 sec.    5 meter walk test average  11.33 sec    4 Stage Balance Test Position  1    comment  dould not perfrom tandem stance     Sit To Stand Test- trial 1  4 sec.    Comment  20 seconds      ADL/IADL Independent with:  Bathing;Dressing;Finances    ADL/IADL Needs Assistance with:  Meal prep;Yard work    ADL/IADL Therapist, sports Index  Moderately frail    6 Minute Walk- Baseline  yes    BP (mmHg)  169/90    HR (bpm)  79    02 Sat (%RA)  94 %    Modified Borg Scale for Dyspnea  0- Nothing at all    6 Minute Walk Post Test  yes    BP (mmHg)  162/80    HR (bpm)  92    02 Sat (%RA)  92 %    Modified Borg Scale for Dyspnea  6-    Perceived Rate of Exertion (Borg)  13- Somewhat hard    Aerobic Endurance Distance Walked  178    Endurance additional comments  Patient was fatigued prior to tresting 2nd to a day of appointments and having to walk to the rest room and back.               Objective measurements completed on examination: See above findings.                          Plan - 07/27/17 1342    Clinical Impression Statement  See below     Clinical Presentation  Stable    Clinical Decision Making  Low    PT Frequency  One time visit        Clinical Impression Statement: Pt is a 84 yo female presenting to OP PT for evaluation prior to possible TAVR surgery due to severe aortic stenosis. Pt reports onset of  approximately 6 months ago. Symptoms are limiting ability to perform ADL's and walk distances. Pt presents with normal ROM and strength, decreased balance and is high at high fall risk 4 stage balance test, decreased walking speed and  poor aerobic endurance per 6 minute walk test. Pt ambulated 100 feet in 1:52 before requesting a seated rest beak lasting 1:58. At time of rest, patient's HR was 98 bpm and O2 was 92 on room air. Pt reported 6/10 shortness of breath on modified scale for dyspnea. Pt able to resume after rest and ambulate an additional 78 feet. Pt ambulated a total of 178 feet in 6 minute walk. . Based on the Short Physical Performance Battery, patient has a frailty rating of 4/12 with </= 5/12 considered frail.    Patient will benefit  from skilled therapeutic intervention in order to improve the following deficits and impairments:  Abnormal gait  Visit Diagnosis: Other abnormalities of gait and mobility     Problem List Patient Active Problem List   Diagnosis Date Noted  . Other allergic rhinitis 12/27/2016  . Gastroesophageal reflux disease without esophagitis 12/27/2016  . Current use of beta blocker 12/27/2016  . History of heart artery stent 12/27/2016  . Severe aortic stenosis 02/16/2013  . Coronary artery disease involving native coronary artery of native heart with angina pectoris ([place]) 02/16/2013  . Other and unspecified hyperlipidemia 02/16/2013  . Essential hypertension, benign 02/16/2013  . Multiple thyroid nodules 02/09/2012  . Chronic cough 08/25/2011  . Dyspnea 08/25/2011    Carney Living PT DPT  07/27/2017, 2:36 PM  [facility] [address] [place], [place], [address] Phone: [phone]   Fax:  [phone]  Name: TAMICA COVELL MRN: [id number] Date of Birth: 1925-10-13

## 2017-07-28 ENCOUNTER — Other Ambulatory Visit: Payer: Self-pay

## 2017-07-28 ENCOUNTER — Encounter: Payer: Self-pay | Admitting: Thoracic Surgery (Cardiothoracic Vascular Surgery)

## 2017-07-28 ENCOUNTER — Institutional Professional Consult (permissible substitution) (INDEPENDENT_AMBULATORY_CARE_PROVIDER_SITE_OTHER): Payer: Medicare Other | Admitting: Thoracic Surgery (Cardiothoracic Vascular Surgery)

## 2017-07-28 VITALS — BP 142/60 | HR 85 | Resp 20 | Ht 59.0 in | Wt 190.0 lb

## 2017-07-28 DIAGNOSIS — R0600 Dyspnea, unspecified: Secondary | ICD-10-CM

## 2017-07-28 DIAGNOSIS — I350 Nonrheumatic aortic (valve) stenosis: Secondary | ICD-10-CM

## 2017-07-28 DIAGNOSIS — I2510 Atherosclerotic heart disease of native coronary artery without angina pectoris: Secondary | ICD-10-CM

## 2017-07-28 DIAGNOSIS — R0609 Other forms of dyspnea: Secondary | ICD-10-CM

## 2017-07-28 NOTE — Patient Instructions (Signed)
Continue all previous medications without any changes at this time  

## 2017-07-28 NOTE — Progress Notes (Signed)
[facility] AND [facility]  CARDIOTHORACIC SURGERY CONSULTATION REPORT  Referring Provider is Belva Crome, MD PCP is [name], MD  Chief Complaint  Patient presents with  . Aortic Stenosis    Surgical eval,2nd TAVR, review all studies    HPI:  Patient is a 84 year old female with history of aortic stenosis, coronary artery disease status post PCI and stenting of the right coronary artery in the distant past, hypertension, hyperlipidemia, stage III chronic kidney disease, hypothyroidism, and degenerative arthritis who has been referred for second surgical consultation to discuss treatment options for management of severe symptomatic aortic stenosis.  The patient states that she has known of the presence of a heart murmur for many years.  In 2010 she presented with exertional angina and underwent diagnostic cardiac catheterization revealing severe ostial stenosis of the right coronary artery.  She underwent PCI and stenting by Dr. Tamala Julian who has been following her ever since.  Echocardiograms have demonstrated gradual progression and severity of aortic stenosis.  She has remained asymptomatic until recently.  Over the past 6 months the patient has complained of progressive exertional fatigue and shortness of breath.  She now gets short of breath and tired with relatively low level activity.  She denies any history of resting shortness of breath, PND, orthopnea, or lower extremity edema.  She has not had chest pain or chest tightness either with activity or at rest.  She reports some mild bilateral lower extremity edema.  Follow-up transthoracic echocardiogram performed June 08, 2017 revealed at least moderate and probably severe aortic stenosis.  The aortic valve was trileaflet with severe calcification and restricted leaflet mobility.  The peak velocity across the aortic valve was measured 3.6 m/s corresponding to mean transvalvular gradient estimated  27 mmHg.  The DVI was reported 0.25 with aortic valve area estimated 1.0 cm.  Left ventricular systolic function remain normal.  She subsequently underwent diagnostic cardiac catheterization by Dr. Tamala Julian and was found to have 70% stenosis of the ostial right coronary artery, 50 to 70% stenosis of the left anterior descending coronary artery and otherwise nonobstructive disease peak to peak transvalvular gradient across aortic valve was measured 30 mmHg.  The patient was referred to the multidisciplinary heart valve clinic and has been evaluated previously by Dr. Cyndia Bent.  Transcatheter aortic valve replacement has been scheduled for next week and a second surgical opinion requested.  Patient is widowed and lives in an apartment in independent living in a senior citizen community in [place].  Up until the last several months the patient has been driving an automobile and has remained entirely physically independent.  She underwent bilateral total knee replacement recently and ambulates using a rolling walker because of poor balance.  She also has chronic pain in both feet related to degenerative arthritis.  However, her mobility has remained stable and her primary complaint is that of worsening exertional shortness of breath and fatigue.  Remainder of her review of systems is noncontributory.  Past Medical History:  Diagnosis Date  . Abnormality of gait   . Acute sinusitis   . Anxiety   . Aphthous ulcer   . Breast cancer ([place])   . CAD (coronary artery disease)   . Chronic kidney disease, stage III (moderate) (HCC)   . Chronic sinusitis   . Depressive disorder   . Edema   . HA (headache)   . Hyperlipemia   . Hypertension   . Hypothyroid   . Insomnia   .  Memory loss   . Osteoarthritis   . Pain in joint, site unspecified   . PNA (pneumonia)   . Reflux esophagitis    chronic    Past Surgical History:  Procedure Laterality Date  . ANGIOPLASTY    . BREAST LUMPECTOMY     x2  . GALLBLADDER  SURGERY    . REPLACEMENT TOTAL KNEE BILATERAL    . RIGHT/LEFT HEART CATH AND CORONARY ANGIOGRAPHY N/A 07/05/2017   Procedure: RIGHT/LEFT HEART CATH AND CORONARY ANGIOGRAPHY;  Surgeon: Belva Crome, MD;  Location: [facility];  Service: Cardiovascular;  Laterality: N/A;  . SKIN CANCER EXCISION  2018   right nostril   . TOTAL ABDOMINAL HYSTERECTOMY      Family History  Problem Relation Age of Onset  . Heart disease Father   . Stroke Mother   . Heart disease Maternal Grandfather   . Breast cancer Sister     Social History   Socioeconomic History  . Marital status: Widowed    Spouse name: Not on file  . Number of children: 3  . Years of education: Not on file  . Highest education level: Not on file  Occupational History  . 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 Financial resource strain: Not on file  . Food insecurity:    Worry: Not on file    Inability: Not on file  . Transportation needs:    Medical: Not on file    Non-medical: Not on file  Tobacco Use  . Smoking status: Former Smoker    Packs/day: 0.50    Years: 20.00    Pack years: 10.00    Types: Cigarettes    Last attempt to quit: 03/15/1964    Years since quitting: 53.4  . Smokeless tobacco: Never Used  Substance and Sexual Activity  . Alcohol use: Yes    Comment: social  . Drug use: No  . Sexual activity: Not on file  Lifestyle  . Physical activity:    Days per week: Not on file    Minutes per session: Not on file  . Stress: Not on file  Relationships  . Social connections:    Talks on phone: Not on file    Gets together: Not on file    Attends religious service: Not on file    Active member of club or organization: Not on file    Attends meetings of clubs or organizations: Not on file    Relationship status: Not on file  . Intimate partner violence:    Fear of current or ex partner: Not on file    Emotionally abused: Not on file    Physically abused:  Not on file    Forced sexual activity: Not on file  Other Topics Concern  . Not on file  Social History Narrative  . Not on file    Current Outpatient Medications  Medication Sig Dispense Refill  . acetaminophen (TYLENOL) 500 MG tablet Take 1,000 mg by mouth daily as needed (sinus headaches).    . ALPRAZolam (XANAX) 0.25 MG tablet Take 0.25 mg by mouth 3 (three) times daily as needed for anxiety or sleep.     Marland Kitchen buPROPion (WELLBUTRIN SR) 150 MG 12 hr tablet Take 150 mg by mouth daily before breakfast.     . citalopram (CELEXA) 10 MG tablet Take 10 mg by mouth at bedtime.     . Cyanocobalamin (B-12 COMPLIANCE INJECTION IJ) Inject 1 Dose as directed every 30 (  thirty) days.    . Cyanocobalamin (B-12 PO) Take 1 Dose by mouth 2 (two) times a week.    . eszopiclone (LUNESTA) 2 MG TABS tablet Take 1-2 mg by mouth at bedtime.     Marland Kitchen HYDROcodone-acetaminophen (NORCO/VICODIN) 5-325 MG tablet Take 1-2 tablets by mouth every 6 (six) hours as needed for moderate pain.    Marland Kitchen ibuprofen (ADVIL,MOTRIN) 200 MG tablet Take 400 mg by mouth daily as needed for headache or moderate pain.    Marland Kitchen levothyroxine (SYNTHROID, LEVOTHROID) 50 MCG tablet Take 50 mcg by mouth once daily    . metoprolol succinate (TOPROL-XL) 25 MG 24 hr tablet Take 25 mg by mouth once daily    . Multiple Vitamins-Minerals (MULTIVITAMIN PO) Take 1 tablet by mouth daily.    Marland Kitchen olmesartan-hydrochlorothiazide (BENICAR HCT) 40-12.5 MG tablet Take 1 tablet by mouth daily.     No current facility-administered medications for this visit.     Allergies  Allergen Reactions  . Betadine [Povidone Iodine]     blisters  . Latex Swelling    hives  . Zestril [Lisinopril]     cough      Review of Systems:   General:  normal appetite, decreased energy, no weight gain, no weight loss, no fever  Cardiac:  no chest pain with exertion, no chest pain at rest, + SOB with exertion, no resting SOB, no PND, no orthopnea, no palpitations, no arrhythmia, no  atrial fibrillation, + LE edema, no dizzy spells, no syncope  Respiratory:  + shortness of breath, no home oxygen, no productive cough, no dry cough, no bronchitis, + wheezing, no hemoptysis, no asthma, no pain with inspiration or cough, no sleep apnea, no CPAP at night  GI:   no difficulty swallowing, no reflux, no frequent heartburn, no hiatal hernia, no abdominal pain, no constipation, + occasional diarrhea, no hematochezia, no hematemesis, no melena  GU:   no dysuria,  no frequency, + urinary tract infection, no hematuria, no kidney stones, no kidney disease  Vascular:  no pain suggestive of claudication, no pain in feet, no leg cramps, + varicose veins, no DVT, no non-healing foot ulcer  Neuro:   no stroke, no TIA's, no seizures, no headaches, no temporary blindness one eye,  no slurred speech, no peripheral neuropathy, + chronic pain, + instability of gait, no memory/cognitive dysfunction  Musculoskeletal: + arthritis, + joint swelling, no myalgias, mild difficulty walking, decreased mobility   Skin:   no rash, no itching, no skin infections, no pressure sores or ulcerations  Psych:   + anxiety, no depression, no nervousness, no unusual recent stress  Eyes:   + blurry vision, no floaters, no recent vision changes, + wears glasses or contacts  ENT:   + hearing loss, no loose or painful teeth, no dentures, last saw dentist March 2019  Hematologic:  no easy bruising, no abnormal bleeding, no clotting disorder, no frequent epistaxis  Endocrine:  no diabetes, does not check CBG's at home           Physical Exam:   BP (!) 142/60   Pulse 85   Resp 20   Ht 4\' 11"  (1.499 m)   Wt 190 lb (86.2 kg)   SpO2 96%   BMI 38.38 kg/m   General:  Moderately obese, elderly but  well-appearing  HEENT:  Unremarkable   Neck:   no JVD, no bruits, no adenopathy   Chest:   clear to auscultation, symmetrical breath sounds, no wheezes, no rhonchi  CV:   RRR, grade III/VI crescendo/decrescendo murmur heard  best at RSB,  no diastolic murmur  Abdomen:  soft, non-tender, no masses   Extremities:  warm, well-perfused, pulses diminished, mild LE edema  Rectal/GU  Deferred  Neuro:   Grossly non-focal and symmetrical throughout  Skin:   Clean and dry, no rashes, no breakdown   Diagnostic Tests:  Transthoracic Echocardiography  Patient:    [name], [name] MR #:       [id number] Study Date: 06/08/2017 Gender:     F Age:        36 Height:     151.1 cm Weight:     86.2 kg BSA:        1.95 m^2 Pt. Status: Room:   Ivar Bury, MD  [place] W. Smith, MD  ATTENDING    Loralie Champagne, M.D.  SONOGRAPHER  Wyatt Mage, RDCS  PERFORMING   [facility], Outpatient  cc:  ------------------------------------------------------------------- LV EF: 60% -   65%  ------------------------------------------------------------------- Indications:      Aortic stenosis (I35).  ------------------------------------------------------------------- History:   PMH:   Dyspnea.  Aortic valve disease.  Risk factors: Former tobacco use. Hypertension. Dyslipidemia.  ------------------------------------------------------------------- Study Conclusions  - Left ventricle: The cavity size was normal. Wall thickness was   increased in a pattern of mild LVH. There was moderate focal   basal hypertrophy of the septum. Systolic function was normal.   The estimated ejection fraction was in the range of 60% to 65%.   Doppler parameters are consistent with abnormal left ventricular   relaxation (grade 1 diastolic dysfunction). The E/e&' ratio is   >15, suggesting elevated LV filling pressure. - Aortic valve: Moderate to severe aortic stenosis. Trivial   regurgitation. Mean gradient (S): 27 mm Hg. Peak gradient (S): 52   mm Hg. Valve area (VTI): 1.03 cm^2. Valve area (Vmax): 1.05 cm^2.   Valve area (Vmean): 1.02 cm^2. - Mitral valve: Calcified annulus. Mildly thickened leaflets .   There was  mild regurgitation. - Right atrium: The atrium was mildly dilated. - Tricuspid valve: There was mild regurgitation. - Pulmonary arteries: PA peak pressure: 49 mm Hg (S). - Inferior vena cava: The vessel was normal in size. The   respirophasic diameter changes were in the normal range (= 50%),   consistent with normal central venous pressure.  Impressions:  - Compared to a prior study in 2016, the LVEF is higher at 60-65%.   There is now moderate to severe aortic stenosis with mean   gradient of 27 mmHg - AVA around 1.0-1.1 cm2.  ------------------------------------------------------------------- Labs, prior tests, procedures, and surgery: Transthoracic echocardiography (03/27/2014).     EF was 55% and PA pressure was 53 (systolic).  ------------------------------------------------------------------- Study data:  Comparison was made to the study of 03/27/2014.  Study status:  Routine.  Procedure:  The patient reported no pain pre or post test. Transthoracic echocardiography. Image quality was adequate.  Study completion:  There were no complications. Transthoracic echocardiography.  M-mode, complete 2D, spectral Doppler, and color Doppler.  Birthdate:  Patient birthdate: Feb 12, 1926.  Age:  Patient is 84 yr old.  Sex:  Gender: female. BMI: 37.8 kg/m^2.  Blood pressure:     180/96  Patient status: Outpatient.  Study date:  Study date: 06/08/2017. Study time: 02:06 PM.  Location:  [facility]  -------------------------------------------------------------------  ------------------------------------------------------------------- Left ventricle:  The cavity size was normal. Wall thickness was increased in a pattern of  mild LVH. There was moderate focal basal hypertrophy of the septum. Systolic function was normal. The estimated ejection fraction was in the range of 60% to 65%. Doppler parameters are consistent with abnormal left ventricular relaxation (grade 1 diastolic  dysfunction). The E/e&' ratio is >15, suggesting elevated LV filling pressure.  ------------------------------------------------------------------- Aortic valve:  Moderate to severe aortic stenosis. Trivial regurgitation.  Doppler:     VTI ratio of LVOT to aortic valve: 0.25. Valve area (VTI): 1.03 cm^2. Indexed valve area (VTI): 0.53 cm^2/m^2. Peak velocity ratio of LVOT to aortic valve: 0.25. Valve area (Vmax): 1.05 cm^2. Indexed valve area (Vmax): 0.54 cm^2/m^2. Mean velocity ratio of LVOT to aortic valve: 0.24. Valve area (Vmean): 1.02 cm^2. Indexed valve area (Vmean): 0.52 cm^2/m^2. Mean gradient (S): 27 mm Hg. Peak gradient (S): 52 mm Hg.  ------------------------------------------------------------------- Aorta:  Aortic root: The aortic root was normal in size. Ascending aorta: The ascending aorta was normal in size.  ------------------------------------------------------------------- Mitral valve:   Calcified annulus. Mildly thickened leaflets . Doppler:  There was mild regurgitation.    Valve area by pressure half-time: 2.37 cm^2. Indexed valve area by pressure half-time: 1.22 cm^2/m^2. Valve area by continuity equation (using LVOT flow): 1.59 cm^2. Indexed valve area by continuity equation (using LVOT flow): 0.82 cm^2/m^2.    Mean gradient (D): 4 mm Hg. Peak gradient (D): 9 mm Hg.  ------------------------------------------------------------------- Left atrium:  The atrium was normal in size.  ------------------------------------------------------------------- Atrial septum:  No defect or patent foramen ovale was identified.   ------------------------------------------------------------------- Right ventricle:  The cavity size was normal. Wall thickness was normal. Systolic function was normal.  ------------------------------------------------------------------- Pulmonic valve:    The valve appears to be grossly normal. Doppler:  There was trivial  regurgitation.  ------------------------------------------------------------------- Tricuspid valve:   Doppler:  There was mild regurgitation.  ------------------------------------------------------------------- Pulmonary artery:   The main pulmonary artery was normal-sized.  ------------------------------------------------------------------- Right atrium:  The atrium was mildly dilated.  ------------------------------------------------------------------- Pericardium:  There was no pericardial effusion.  ------------------------------------------------------------------- Systemic veins: Inferior vena cava: The vessel was normal in size. The respirophasic diameter changes were in the normal range (= 50%), consistent with normal central venous pressure.  ------------------------------------------------------------------- Measurements   Left ventricle                            Value          Reference  LV ID, ED, PLAX chordal           (L)     36    mm       43 - 52  LV ID, ES, PLAX chordal                   28    mm       23 - 38  LV fx shortening, PLAX chordal    (L)     22    %        >=29  LV PW thickness, ED                       13    mm       ---------  IVS/LV PW ratio, ED                       1.08           <=1.3  Stroke volume, 2D  78    ml       ---------  Stroke volume/bsa, 2D                     40    ml/m^2   ---------  LV e&', lateral                            7.4   cm/s     ---------  LV E/e&', lateral                          20.68          ---------  LV e&', medial                             6.09  cm/s     ---------  LV E/e&', medial                           25.12          ---------  LV e&', average                            6.75  cm/s     ---------  LV E/e&', average                          22.68          ---------    Ventricular septum                        Value          Reference  IVS thickness, ED                          14    mm       ---------    LVOT                                      Value          Reference  LVOT ID, S                                23    mm       ---------  LVOT area                                 4.15  cm^2     ---------  LVOT peak velocity, S                     91.7  cm/s     ---------  LVOT mean velocity, S                     59.4  cm/s     ---------  LVOT VTI, S  22.5  cm       ---------  Stroke volume (SV), LVOT DP               93.5  ml       ---------  Stroke index (SV/bsa), LVOT DP            48    ml/m^2   ---------    Aortic valve                              Value          Reference  Aortic valve peak velocity, S             362   cm/s     ---------  Aortic valve mean velocity, S             243   cm/s     ---------  Aortic valve VTI, S                       90.7  cm       ---------  Aortic mean gradient, S                   27    mm Hg    ---------  Aortic peak gradient, S                   52    mm Hg    ---------  VTI ratio, LVOT/AV                        0.25           ---------  Aortic valve area, VTI                    1.03  cm^2     ---------  Aortic valve area/bsa, VTI                0.53  cm^2/m^2 ---------  Velocity ratio, peak, LVOT/AV             0.25           ---------  Aortic valve area, peak velocity          1.05  cm^2     ---------  Aortic valve area/bsa, peak               0.54  cm^2/m^2 ---------  velocity  Velocity ratio, mean, LVOT/AV             0.24           ---------  Aortic valve area, mean velocity          1.02  cm^2     ---------  Aortic valve area/bsa, mean               0.52  cm^2/m^2 ---------  velocity  Aortic regurg pressure half-time          359   ms       ---------    Aorta                                     Value          Reference  Aortic root ID, ED  34    mm       ---------  Ascending aorta ID, A-P, S                30    mm       ---------    Left atrium                                Value          Reference  LA ID, A-P, ES                            38    mm       ---------  LA ID/bsa, A-P                            1.95  cm/m^2   <=2.2  LA volume, S                              61.5  ml       ---------  LA volume/bsa, S                          31.6  ml/m^2   ---------  LA volume, ES, 1-p A4C                    50.4  ml       ---------  LA volume/bsa, ES, 1-p A4C                25.9  ml/m^2   ---------  LA volume, ES, 1-p A2C                    68.9  ml       ---------  LA volume/bsa, ES, 1-p A2C                35.3  ml/m^2   ---------    Mitral valve                              Value          Reference  Mitral E-wave peak velocity               153   cm/s     ---------  Mitral A-wave peak velocity               139   cm/s     ---------  Mitral mean velocity, D                   94.4  cm/s     ---------  Mitral deceleration time          (H)     285   ms       150 - 230  Mitral pressure half-time                 102   ms       ---------  Mitral mean gradient, D                   4     mm Hg    ---------  Mitral peak gradient, D                   9     mm Hg    ---------  Mitral E/A ratio, peak                    1.1            ---------  Mitral valve area, PHT, DP                2.37  cm^2     ---------  Mitral valve area/bsa, PHT, DP            1.22  cm^2/m^2 ---------  Mitral valve area, LVOT                   1.59  cm^2     ---------  continuity  Mitral valve area/bsa, LVOT               0.82  cm^2/m^2 ---------  continuity  Mitral annulus VTI, D                     49.1  cm       ---------    Pulmonary arteries                        Value          Reference  PA pressure, S, DP                (H)     49    mm Hg    <=30    Tricuspid valve                           Value          Reference  Tricuspid regurg peak velocity            319   cm/s     ---------  Tricuspid peak RV-RA gradient             41    mm Hg    ---------    Systemic veins                             Value          Reference  Estimated CVP                             8     mm Hg    ---------    Right ventricle                           Value          Reference  TAPSE                                     26.1  mm       ---------  RV pressure, S, DP                (H)     49    mm Hg    <=30  RV s&', lateral, S  10.7  cm/s     ---------  Legend: (L)  and  (H)  mark values outside specified reference range.  ------------------------------------------------------------------- Prepared and Electronically Authenticated by  Lyman Bishop MD 2019-03-27T15:44:40   RIGHT/LEFT HEART CATH AND CORONARY ANGIOGRAPHY  Conclusion    Calcified thoracic, arch, and descending aorta  RCA has stent extending into the ascending aortic root.  There is ostial 70% narrowing.  The RCA is dominant without any significant residual disease.  Left main is widely patent.  LAD beyond the first diagonal contains eccentric 50-70% narrowing.  The first diagonal contains a branch that has 90% stenosis.  Circumflex ostium contains 40-50% narrowing.  3 relatively small obtuse marginal branches are widely patent.  Mild pulmonary hypertension.  Moderate aortic stenosis based upon hemodynamics with calculated aortic valve area 1.3 cm square.  Peak to peak gradient 30 mmHg.  Possible stage D3 severe aortic stenosis.  RECOMMENDATIONS:   Follow-up with Dr. Angelena Form to determine further steps towards consideration of percutaneous aortic valve replacement.    Indications   Aortic stenosis, severe [I35.0 (ICD-10-CM)]  CAD in native artery [I25.10 (ICD-10-CM)]  Procedural Details/Technique   Technical Details The right radial area was sterilely prepped and draped. Intravenous sedation with Versed and fentanyl was administered. 1% Xylocaine was infiltrated to achieve local analgesia. Using real-time vascular ultrasound, a double wall stick with an angiocath  was utilized to obtain intra-arterial access. A VUS image was saved for the permanent record.The modified Seldinger technique was used to place a 34F " Slender" sheath in the right radial artery. Weight based heparin was administered. Coronary angiography was done using 5 F catheters. Right coronary angiography was performed with a JR4. Left ventricular hemodymic recordings and angiography was done using the JR 4 catheter and hand injection. Left coronary angiography was performed with a JL 3.5 cm.  Right heart catheterization was performed by exchanging a previously placed antecubital IV angio-cath for a 5 French Slender sheath. 1% Xylocaine was used to locally nesthetize the area around the IV site. The IV catheter was wired using an .018 guidewire. The modified Seldinger technique was used to place the 5 [place] sheath. Double glove technique was used to enhance sterility. After sheath insertion, right heart cath was performed using a 5 French balloon tipped catheter and fluoroscopic guidance. Pressures were recorded in each chamber and in the pulmonary capillary wedge position.. The main pulmonary artery O2 saturation was sampled.   Hemostasis was achieved using a pneumatic band.  During this procedure the patient is administered a total of Versed 2 mg and Fentanyl 50 mg to achieve and maintain moderate conscious sedation. The patient's heart rate, blood pressure, and oxygen saturation are monitored continuously during the procedure. The period of conscious sedation is 46 minutes, of which I was present face-to-face 100% of this time.   Estimated blood loss <50 mL.  During this procedure the patient was administered the following to achieve and maintain moderate conscious sedation: Versed 2 mg, Fentanyl 50 mcg, while the patient's heart rate, blood pressure, and oxygen saturation were continuously monitored. The period of conscious sedation was 46 minutes, of which I was present face-to-face 100% of this  time.  Coronary Findings   Diagnostic  Dominance: Right  Left Main  Dist LM to Ost LAD lesion 40% stenosed  Dist LM to Ost LAD lesion is 40% stenosed.  Left Anterior Descending  Prox LAD lesion 70% stenosed  Prox LAD lesion is 70% stenosed.  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  Left Circumflex  Ost Cx lesion 50% stenosed  Ost Cx lesion is 50% stenosed.  Right Coronary Artery  Ost RCA to Prox RCA lesion 70% stenosed  Ost RCA to Prox RCA lesion is 70% stenosed. The lesion was previously treated.  Mid RCA lesion 30% stenosed  Mid RCA lesion is 30% stenosed.  Intervention   No interventions have been documented.  Right Heart   Right Heart Pressures Hemodynamic findings consistent with mild pulmonary hypertension. Elevated LV EDP consistent with volume overload.  Coronary Diagrams   Diagnostic Diagram       Implants    No implant documentation for this case.  MERGE Images   Show images for CARDIAC CATHETERIZATION   Link to Procedure Log   Procedure Log    Hemo Data    Most Recent Value  Fick Cardiac Output 7.62 L/min  Fick Cardiac Output Index 4.23 (L/min)/BSA  Aortic Mean Gradient 28.2 mmHg  Aortic Peak Gradient 30 mmHg  Aortic Valve Area 1.33  Aortic Value Area Index 0.74 cm2/BSA  RA A Wave 10 mmHg  RA V Wave 8 mmHg  RA Mean 7 mmHg  RV Systolic Pressure 40 mmHg  RV Diastolic Pressure 4 mmHg  RV EDP 9 mmHg  PA Systolic Pressure 40 mmHg  PA Diastolic Pressure 18 mmHg  PA Mean 28 mmHg  PW A Wave 19 mmHg  PW V Wave 21 mmHg  PW Mean 17 mmHg  AO Systolic Pressure 818 mmHg  AO Diastolic Pressure 64 mmHg  AO Mean 563 mmHg  LV Systolic Pressure 149 mmHg  LV Diastolic Pressure 6 mmHg  LV EDP 18 mmHg  Arterial Occlusion Pressure Extended Systolic Pressure 702 mmHg  Arterial Occlusion Pressure Extended Diastolic Pressure 62 mmHg  Arterial Occlusion Pressure Extended Mean Pressure 102 mmHg  Left Ventricular  Apex Extended Systolic Pressure 637 mmHg  Left Ventricular Apex Extended Diastolic Pressure 5 mmHg  Left Ventricular Apex Extended EDP Pressure 17 mmHg  QP/QS 1  TPVR Index 6.61 HRUI  TSVR Index 24.32 HRUI  PVR SVR Ratio 0.11  TPVR/TSVR Ratio 0.27     Cardiac TAVR CT  TECHNIQUE: The patient was scanned on a [employer]. A 120 kV retrospective scan was triggered in the descending thoracic aorta at 111 HU's. Gantry rotation speed was 250 msecs and collimation was .6 mm. 5 mg of iv Metoprolol and no nitro were given. The 3D data set was reconstructed in 5% intervals of the R-R cycle. Systolic and diastolic phases were analyzed on a dedicated work station using MPR, MIP and VRT modes. The patient received 80 cc of contrast.  FINDINGS: Aortic Valve: Trileaflet aortic valve with severely thickened and moderately calcified leaflets with severely reduced leaflets excursion. Only minimal calcifications are extending into the LVOT.  Aorta: Normal size with mild calcifications in the ascending aorta but moderate to severe atherosclerosis and almost circumferential calcifications in the aortic arch and descending thoracic aorta. No dissection.  Sinotubular Junction: 26 x 25 mm  Ascending Thoracic Aorta: 30 x 29 mm  Aortic Arch: 20 x 20 mm  Descending Thoracic Aorta: 23 x 21 mm  Sinus of Valsalva Measurements:  Non-coronary: 30 mm  Right -coronary: 31 mm  Left -coronary: 32 mm  Coronary Artery Height above Annulus:  Left Main: 17 mm  Right Coronary: 18 mm  Virtual Basal Annulus Measurements:  Maximum/Minimum Diameter: 27.2 x 22.1 mm  Mean Diameter: 24.8 mm  Perimeter:  79.1 mm  Area: 484 mm2  Coronary Arteries: NTG not used and scan not sufficient for coronary evaluation, however right ostial coronary stent is extending into the right coronary sinus (3.4 mm). However, RCA is 18 mm above the annulus.  Optimum Fluoroscopic Angle for  Delivery: LAO 8 CAU 7  Dilated pulmonary artery measuring 31 x 24 mm  IMPRESSION: 1. Trileaflet aortic valve with severely thickened and moderately calcified leaflets with severely reduced leaflets excursion. Only minimal calcifications are extending into the LVOT. Annular measurements suitable for delivery of a 26 mm Edwards-SAPIEN 3 valve.  2. Sufficient coronary to annulus distance.  3. Optimum Fluoroscopic Angle for Delivery: LAO 8 CAU 7.  4. No thrombus in the left atrial appendage.  5. Normal size of the thoracic aorta with mild calcifications in the ascending aorta but moderate to severe atherosclerosis and almost circumferential calcifications in the aortic arch and descending thoracic aorta. No dissection.  6. Right ostial coronary stent is extending into the right coronary sinus (3.4 mm). However, RCA is 18 mm above the annulus and should not interfere with valve placement.   Electronically Signed   By: Ena Dawley   On: 07/25/2017 17:34   CT ANGIOGRAPHY CHEST, ABDOMEN AND PELVIS  TECHNIQUE: Multidetector CT imaging through the chest, abdomen and pelvis was performed using the standard protocol during bolus administration of intravenous contrast. Multiplanar reconstructed images and MIPs were obtained and reviewed to evaluate the vascular anatomy.  CONTRAST:  180mL ISOVUE-370 IOPAMIDOL (ISOVUE-370) INJECTION 76%  COMPARISON:  CT the chest, abdomen and pelvis 03/13/2013.  FINDINGS: CTA CHEST FINDINGS  Cardiovascular: Heart size is mildly enlarged. There is no significant pericardial fluid, thickening or pericardial calcification. There is aortic atherosclerosis, as well as atherosclerosis of the great vessels of the mediastinum and the coronary arteries, including calcified atherosclerotic plaque in the left anterior descending and right coronary arteries. Severe thickening calcification of the aortic valve. Calcification of the mitral  annulus.  Mediastinum/Lymph Nodes: No pathologically enlarged mediastinal or hilar lymph nodes. Patulous esophagus. No axillary lymphadenopathy.  Lungs/Pleura: No acute consolidative airspace disease. No pleural effusions. No suspicious appearing pulmonary nodules or masses.  Musculoskeletal/Soft Tissues: There are no aggressive appearing lytic or blastic lesions noted in the visualized portions of the skeleton.  CTA ABDOMEN AND PELVIS FINDINGS  Hepatobiliary: Subcentimeter low-attenuation lesions are noted in the liver, too small to characterize, but statistically likely to represent tiny cysts. No other larger more suspicious appearing hepatic lesions are noted. No intra or extrahepatic biliary ductal dilatation. Status post cholecystectomy.  Pancreas: No pancreatic mass. No pancreatic ductal dilatation. No pancreatic or peripancreatic fluid or inflammatory changes.  Spleen: Unremarkable.  Adrenals/Urinary Tract: Multiple well-defined low-attenuation nonenhancing lesions in both kidneys are compatible with simple cysts, largest of which measures 5.4 cm in the interpolar region of the right kidney. There are several other subcentimeter low-attenuation lesions in both kidneys which are too small to definitively characterize, but are statistically likely to represent tiny cysts. Calcifications in the right adrenal gland, likely from prior infection or hemorrhage. Left adrenal gland is normal in appearance. No hydroureteronephrosis. Urinary bladder is normal in appearance.  Stomach/Bowel: The appearance of the stomach is normal. There is no pathologic dilatation of small bowel or colon. Numerous colonic diverticulae are noted, without surrounding inflammatory changes to suggest an acute diverticulitis at this time. The appendix is not confidently identified and may be surgically absent. Regardless, there are no inflammatory changes noted adjacent to the cecum  to suggest the  presence of an acute appendicitis at this time.  Vascular/Lymphatic: Extensive aortic atherosclerosis, with vascular findings and measurements pertinent to potential TAVR procedure, as detailed below. Celiac axis, superior mesenteric artery and inferior mesenteric artery are all patent without definite hemodynamically significant stenosis. Single renal arteries are both patent without hemodynamically significant stenosis. No lymphadenopathy noted in the abdomen or pelvis.  Reproductive: Status post hysterectomy. Ovaries are not confidently identified may be surgically absent or atrophic.  Other: Small epigastric ventral hernia containing only fat. No significant volume of ascites. No pneumoperitoneum.  Musculoskeletal: There are no aggressive appearing lytic or blastic lesions noted in the visualized portions of the skeleton.  VASCULAR MEASUREMENTS PERTINENT TO TAVR:  AORTA:  Minimal Aortic Diameter-12 x 12 mm  Severity of Aortic Calcification-severe  RIGHT PELVIS:  Right Common Iliac Artery -  Minimal Diameter-12.1 x 9.7 mm  Tortuosity-mild  Calcification-moderate  Right External Iliac Artery -  Minimal Diameter-8.4 x 8.8 mm  Tortuosity-mild  Calcification-none  Right Common Femoral Artery -  Minimal Diameter-8.5 x 8.7 mm  Tortuosity - mild  Calcification-mild  LEFT PELVIS:  Left Common Iliac Artery -  Minimal Diameter-10.5 x 8.1 mm  Tortuosity - mild  Calcification-moderate  Left External Iliac Artery -  Minimal Diameter-8.1 x 7.8 mm  Tortuosity - mild  Calcification-none  Left Common Femoral Artery -  Minimal Diameter-8.4 x 8.0 mm  Tortuosity - mild  Calcification-mild  Review of the MIP images confirms the above findings.  IMPRESSION: 1. Vascular findings and measurements pertinent to potential TAVR procedure, as detailed above. 2. Severe thickening calcification of the aortic  valve, compatible with the reported clinical history of severe aortic stenosis. 3. Aortic atherosclerosis, in addition to 2 vessel coronary artery disease. 4. Mild cardiomegaly. 5. Small epigastric ventral hernia containing only omental fat. No associated bowel incarceration or obstruction at this time. 6. Additional incidental findings, as above.  Aortic Atherosclerosis (ICD10-I70.0).   Electronically Signed   By: Vinnie Langton M.D.   On: 07/25/2017 14:51    Impression:  Patient has stage D severe symptomatic aortic stenosis.  She presents with a six-month history of worsening symptoms of exertional shortness of breath and fatigue consistent with chronic diastolic congestive heart failure, New York Heart Association functional class III.  I have personally reviewed the patient's recent transthoracic echocardiogram, diagnostic cardiac catheterization, and CT angiograms.  Echocardiogram reveals a trileaflet aortic valve with severe calcification, thickening, and severely restricted leaflet mobility.  Peak velocity across aortic valve was only measured 3.6 m/s corresponding to mean transvalvular gradient of 27 mmHg, but DVI was quite low at 0.25.  The patient has had progressive symptoms without any other clear explanation, and I agree this likely represents paradoxical low flow, low gradient, normal ejection fraction severe aortic stenosis.  Diagnostic cardiac catheterization reveals 70% narrowing of the right coronary artery and 50 to 70% stenosis of the mid left anterior descending coronary artery.  Patient does not have anginal symptoms.  I agree that transcatheter aortic valve replacement is indicated and likely the best course of treatment.  I would not consider this elderly patient a candidate for conventional surgical aortic valve replacement with or without coronary artery bypass grafting.  Cardiac-gated CTA of the heart reveals anatomical characteristics consistent with aortic  stenosis suitable for treatment by transcatheter aortic valve replacement without any significant complicating features and CTA of the aorta and iliac vessels demonstrate what appears to be adequate pelvic vascular access to facilitate a transfemoral approach.    Plan:  The patient and her daughter counseled at length regarding treatment alternatives for management of severe symptomatic aortic stenosis. Alternative approaches such as conventional aortic valve replacement, transcatheter aortic valve replacement, and continued medical therapy without intervention were compared and contrasted at length.  The risks associated with conventional surgical aortic valve replacement were discussed in detail, as were expectations for post-operative convalescence, and why I would be reluctant to consider this patient a candidate for conventional surgery.  Issues specific to transcatheter aortic valve replacement were discussed including questions about long term valve durability, the potential for paravalvular leak, possible increased risk of need for permanent pacemaker placement, and other technical complications related to the procedure itself.  Long-term prognosis with medical therapy was discussed. This discussion was placed in the context of the patient's own specific clinical presentation and past medical history.  All of their questions have been addressed.  The patient desires to proceed with TAVR next week as previously scheduled.  Following the decision to proceed with transcatheter aortic valve replacement, a discussion has been held regarding what types of management strategies would be attempted intraoperatively in the event of life-threatening complications, including whether or not the patient would be considered a candidate for the use of cardiopulmonary bypass and/or conversion to open sternotomy for attempted surgical intervention.  The patient has been advised of a variety of complications that might  develop including but not limited to risks of death, stroke, paravalvular leak, aortic dissection or other major vascular complications, aortic annulus rupture, device embolization, cardiac rupture or perforation, mitral regurgitation, acute myocardial infarction, arrhythmia, heart block or bradycardia requiring permanent pacemaker placement, congestive heart failure, respiratory failure, renal failure, pneumonia, infection, other late complications related to structural valve deterioration or migration, or other complications that might ultimately cause a temporary or permanent loss of functional independence or other long term morbidity.  The patient provides full informed consent for the procedure as described and all questions were answered.   I spent in excess of 90 minutes during the conduct of this office consultation and >50% of this time involved direct face-to-face encounter with the patient for counseling and/or coordination of their care.    Valentina Gu. Roxy Manns, MD 07/28/2017 3:25 PM

## 2017-07-29 ENCOUNTER — Encounter (HOSPITAL_COMMUNITY)
Admission: RE | Admit: 2017-07-29 | Discharge: 2017-07-29 | Disposition: A | Discharge status: Home / Self Care | Payer: Medicare Other | Source: Ambulatory Visit | Attending: Cardiovascular Disease | Admitting: Cardiovascular Disease

## 2017-07-29 ENCOUNTER — Encounter (HOSPITAL_COMMUNITY): Payer: Self-pay

## 2017-07-29 ENCOUNTER — Other Ambulatory Visit: Payer: Self-pay

## 2017-07-29 DIAGNOSIS — Z01812 Encounter for preprocedural laboratory examination: Secondary | ICD-10-CM | POA: Diagnosis present

## 2017-07-29 DIAGNOSIS — Z01818 Encounter for other preprocedural examination: Secondary | ICD-10-CM | POA: Diagnosis present

## 2017-07-29 DIAGNOSIS — I350 Nonrheumatic aortic (valve) stenosis: Secondary | ICD-10-CM | POA: Insufficient documentation

## 2017-07-29 DIAGNOSIS — R918 Other nonspecific abnormal finding of lung field: Secondary | ICD-10-CM | POA: Diagnosis not present

## 2017-07-29 LAB — HEMOGLOBIN A1C
Hgb A1c MFr Bld: 5.3 % (ref 4.8–5.6)
MEAN PLASMA GLUCOSE: 105.41 mg/dL

## 2017-07-29 LAB — COMPREHENSIVE METABOLIC PANEL
ALBUMIN: 3.9 g/dL (ref 3.5–5.0)
ALT: 15 U/L (ref 14–54)
ANION GAP: 11 (ref 5–15)
AST: 19 U/L (ref 15–41)
Alkaline Phosphatase: 52 U/L (ref 38–126)
BILIRUBIN TOTAL: 0.8 mg/dL (ref 0.3–1.2)
BUN: 24 mg/dL — ABNORMAL HIGH (ref 6–20)
CO2: 22 mmol/L (ref 22–32)
Calcium: 9.2 mg/dL (ref 8.9–10.3)
Chloride: 102 mmol/L (ref 101–111)
Creatinine, Ser: 1.53 mg/dL — ABNORMAL HIGH (ref 0.44–1.00)
GFR calc Af Amer: 33 mL/min — ABNORMAL LOW (ref >60)
GFR calc non Af Amer: 29 mL/min — ABNORMAL LOW (ref >60)
GLUCOSE: 122 mg/dL — AB (ref 65–99)
POTASSIUM: 4.4 mmol/L (ref 3.5–5.1)
SODIUM: 135 mmol/L (ref 135–145)
Total Protein: 6.7 g/dL (ref 6.5–8.1)

## 2017-07-29 LAB — PROTIME-INR
INR: 1.07
PROTHROMBIN TIME: 13.9 s (ref 11.4–15.2)

## 2017-07-29 LAB — CBC
HEMATOCRIT: 33 % — AB (ref 36.0–46.0)
Hemoglobin: 10.8 g/dL — ABNORMAL LOW (ref 12.0–15.0)
MCH: 29.5 pg (ref 26.0–34.0)
MCHC: 32.7 g/dL (ref 30.0–36.0)
MCV: 90.2 fL (ref 78.0–100.0)
Platelets: 255 10*3/uL (ref 150–400)
RBC: 3.66 MIL/uL — ABNORMAL LOW (ref 3.87–5.11)
RDW: 13.2 % (ref 11.5–15.5)
WBC: 6.7 10*3/uL (ref 4.0–10.5)

## 2017-07-29 LAB — SURGICAL PCR SCREEN
MRSA, PCR: NEGATIVE
STAPHYLOCOCCUS AUREUS: NEGATIVE

## 2017-07-29 LAB — BLOOD GAS, ARTERIAL
ACID-BASE EXCESS: 0.3 mmol/L (ref 0.0–2.0)
Bicarbonate: 24.6 mmol/L (ref 20.0–28.0)
DRAWN BY: 421801
FIO2: 21
O2 SAT: 95.5 %
Patient temperature: 98.6
pCO2 arterial: 41.5 mmHg (ref 32.0–48.0)
pH, Arterial: 7.391 (ref 7.350–7.450)
pO2, Arterial: 84.2 mmHg (ref 83.0–108.0)

## 2017-07-29 LAB — TYPE AND SCREEN
ABO/RH(D): O POS
Antibody Screen: NEGATIVE

## 2017-07-29 LAB — APTT: APTT: 28 s (ref 24–36)

## 2017-07-29 LAB — BRAIN NATRIURETIC PEPTIDE: B NATRIURETIC PEPTIDE 5: 281.2 pg/mL — AB (ref 0.0–100.0)

## 2017-07-29 NOTE — Progress Notes (Signed)
PCP: Koleen Nimrod, MD  Cardiologist: Daneen Schick, MD  EKG: 423/19 in EPIC  Stress test: 03/20/13 in EPIC  ECHO: 06/08/17 in Epic  Cardiac Cath: 07/05/17 in EPIC  Chest x-ray: 07/29/17 per order

## 2017-07-29 NOTE — Pre-Procedure Instructions (Signed)
[name]  07/29/2017      [facility] [place], [place] [address] Phone: [phone] Fax: [place], [place] [address] Phone: [phone] Fax: [phone]    Your procedure is scheduled on Aug 02, 2017.  Report to [facility] Admitting at 1030 AM.  Call this number if you have problems the morning of surgery:  [phone]   Remember:  No food after midnight.  You may drink clear liquids until 9:30 AM .  Clear liquids allowed are:                    Water, Juice (non-citric and without pulp), Carbonated beverages, Clear Tea, Black Coffee only, Plain Jell-O only, Gatorade and Plain Popsicles only   Continue all medications as directed by your physician except follow these medication instructions before surgery below   Take these medicines the morning of surgery with A SIP OF WATER (none)  7 days prior to surgery STOP taking any Aspirin (unless otherwise instructed by your surgeon), Aleve, Naproxen, Ibuprofen, Motrin, Advil, Goody's, BC's, all herbal medications, fish oil, and all vitamins    Do not wear jewelry, make-up or nail polish.  Do not wear lotions, powders, or perfumes, or deodorant.  Do not shave 48 hours prior to surgery.    Do not bring valuables to the hospital.  [facility] is not responsible for any belongings or valuables.  Contacts, dentures or bridgework may not be worn into surgery.  Leave your suitcase in the car.  After surgery it may be brought to your room.  For patients admitted to the hospital, discharge time will be determined by your treatment team.  Patients discharged the day of surgery will not be allowed to drive home.    [facility]- Preparing For Surgery  Before surgery, you can play an important role. Because skin is not sterile, your skin needs to be as free of germs as  possible. You can reduce the number of germs on your skin by washing with CHG (chlorahexidine gluconate) Soap before surgery.  CHG is an antiseptic cleaner which kills germs and bonds with the skin to continue killing germs even after washing.  Oral Hygiene is also important to reduce your risk of infection.  Remember - BRUSH YOUR TEETH THE MORNING OF SURGERY  Please do not use if you have an allergy to CHG or antibacterial soaps. If your skin becomes reddened/irritated stop using the CHG.  Do not shave (including legs and underarms) for at least 48 hours prior to first CHG shower. It is OK to shave your face.  Please follow these instructions carefully.   1. Shower the NIGHT BEFORE SURGERY and the MORNING OF SURGERY with CHG.   2. If you chose to wash your hair, wash your hair first as usual with your normal shampoo.  3. After you shampoo, rinse your hair and body thoroughly to remove the shampoo.  4. Use CHG as you would any other liquid soap. You can apply CHG directly to the skin and wash gently with a scrungie or a clean washcloth.   5. Apply the CHG Soap to your body ONLY FROM THE NECK DOWN.  Do not use on open wounds or open sores. Avoid contact with your eyes, ears, mouth and genitals (private parts). Wash Face and genitals (  private parts)  with your normal soap.  6. Wash thoroughly, paying special attention to the area where your surgery will be performed.  7. Thoroughly rinse your body with warm water from the neck down.  8. DO NOT shower/wash with your normal soap after using and rinsing off the CHG Soap.  9. Pat yourself dry with a CLEAN TOWEL.  10. Wear CLEAN PAJAMAS to bed the night before surgery, wear comfortable clothes the morning of surgery  11. Place CLEAN SHEETS on your bed the night of your first shower and DO NOT SLEEP WITH PETS.  Day of Surgery:  Do not apply any deodorants/lotions.  Please wear clean clothes to the hospital/surgery center.   Remember to brush  your teeth.    Please read over the following fact sheets that you were given. Pain Booklet, Coughing and Deep Breathing, MRSA Information and Surgical Site Infection Prevention

## 2017-08-01 MED ORDER — DOPAMINE-DEXTROSE 3.2-5 MG/ML-% IV SOLN
0.0000 ug/kg/min | INTRAVENOUS | Status: DC
  Filled 2017-08-01 (×2): qty 250

## 2017-08-01 MED ORDER — NOREPINEPHRINE BITARTRATE 1 MG/ML IV SOLN
0.0000 ug/min | INTRAVENOUS | Status: DC
  Filled 2017-08-01: qty 4

## 2017-08-01 MED ORDER — DEXMEDETOMIDINE HCL IN NACL 400 MCG/100ML IV SOLN
0.1000 ug/kg/h | INTRAVENOUS | Status: AC
  Administered 2017-08-02: 1.5 ug/kg/h via INTRAVENOUS
  Filled 2017-08-01: qty 100

## 2017-08-01 MED ORDER — SODIUM CHLORIDE 0.9 % IV SOLN
1.5000 g | INTRAVENOUS | Status: AC
  Administered 2017-08-02: 1.5 g via INTRAVENOUS
  Filled 2017-08-01: qty 1.5

## 2017-08-01 MED ORDER — MAGNESIUM SULFATE 50 % IJ SOLN
40.0000 meq | INTRAMUSCULAR | Status: DC
  Filled 2017-08-01: qty 9.85

## 2017-08-01 MED ORDER — SODIUM CHLORIDE 0.9 % IV SOLN
INTRAVENOUS | Status: DC
  Filled 2017-08-01: qty 30

## 2017-08-01 MED ORDER — EPINEPHRINE PF 1 MG/ML IJ SOLN
0.0000 ug/min | INTRAMUSCULAR | Status: DC
  Filled 2017-08-01: qty 4

## 2017-08-01 MED ORDER — VANCOMYCIN HCL 10 G IV SOLR
1250.0000 mg | INTRAVENOUS | Status: AC
  Administered 2017-08-02: 1250 mg via INTRAVENOUS
  Filled 2017-08-01: qty 1250

## 2017-08-01 MED ORDER — POTASSIUM CHLORIDE 2 MEQ/ML IV SOLN
80.0000 meq | INTRAVENOUS | Status: DC
  Filled 2017-08-01: qty 40

## 2017-08-01 MED ORDER — SODIUM CHLORIDE 0.9 % IV SOLN
INTRAVENOUS | Status: DC
  Filled 2017-08-01: qty 1

## 2017-08-01 MED ORDER — NITROGLYCERIN IN D5W 200-5 MCG/ML-% IV SOLN
2.0000 ug/min | INTRAVENOUS | Status: DC
  Filled 2017-08-01: qty 250

## 2017-08-01 MED ORDER — SODIUM CHLORIDE 0.9 % IV SOLN
30.0000 ug/min | INTRAVENOUS | Status: DC
  Filled 2017-08-01: qty 2

## 2017-08-01 NOTE — H&P (Signed)
[address]       [place] [address]             [phone]      Cardiothoracic Surgery Admission History and Physical   Referring Provider is Belva Crome, MD  PCP is [name], MD      Chief Complaint  Patient presents with  . Aortic Stenosis       HPI:   The patient is a 84 year old woman with a history of hypertension, hyperlipidemia, hypothyroidism, coronary disease status post stenting of the right coronary artery, stage III chronic kidney disease, and aortic stenosis that has been followed by Dr. Tamala Julian. Her echo in January 2016 showed a moderately calcified aortic valve with a mean gradient of 18 mmHg and a valve area of 0.8 cm with normal left ventricular systolic function and an ejection fraction of 55 to 60%. She was felt to have moderate aortic stenosis and was asymptomatic. She now reports a 14-month history of progressive exertional fatigue and dyspnea. She says that she feels okay at rest although she is tired. She gets short of breath with walking around in her apartment and has to sit down and rest after taking shower. She has not had any chest pain or pressure. She denies any dizziness or syncope. She has had some intermittent lower extremity edema. She had a follow-up echocardiogram on 06/08/2017 which showed a severely calcified trileaflet aortic valve with poor leaflet mobility. The mean gradient was 27 mmHg with a dimensionless index of 0.25. The aortic valve area was measured at 1.0 cm. Left ventricular ejection fraction was 60 to 65% with grade 1 diastolic dysfunction. Cardiac catheterization on 07/05/2017 showed a patent stent extending out of the right coronary ostium with about 70% narrowing. The left main was widely patent. The LAD had about 50 to 70% narrowing beyond the first diagonal branch and the first diagonal branch had about 90% stenosis. The ostium of the left circumflex had about 40 to 50% narrowing. The peak to peak gradient  across aortic valve was 30 mmHg. Cardiac index was 4.23. PA pressure was 40/18 with a mean wedge pressure of 17. LVEDP was 18.   The patient has a daughter who is a recently retired Music therapist from [place] patient lives independently at [place] in [place]. She has her own apartment which she cares for and still drives. She said that her exertional fatigue and shortness of breath has markedly limited her ability to participate in any activities at her retirement community.      Past Medical History:  Diagnosis Date  . Abnormality of gait   . Acute sinusitis   . Anxiety   . Aphthous ulcer   . Breast cancer ([facility])   . CAD (coronary artery disease)   . Chronic kidney disease, stage III (moderate) (HCC)   . Chronic sinusitis   . Depressive disorder   . Edema   . HA (headache)   . Hyperlipemia   . Hypertension   . Hypothyroid   . Insomnia   . Memory loss   . Osteoarthritis   . Pain in joint, site unspecified   . PNA (pneumonia)   . Reflux esophagitis    chronic        Past Surgical History:  Procedure Laterality Date  . ANGIOPLASTY    . BREAST LUMPECTOMY     x2  . GALLBLADDER SURGERY    . REPLACEMENT TOTAL KNEE  BILATERAL    . RIGHT/LEFT HEART CATH AND CORONARY ANGIOGRAPHY N/A 07/05/2017   Procedure: RIGHT/LEFT HEART CATH AND CORONARY ANGIOGRAPHY; Surgeon: Belva Crome, MD; Location: [facility]; Service: Cardiovascular; Laterality: N/A;  . SKIN CANCER EXCISION  2018   right nostril   . TOTAL ABDOMINAL HYSTERECTOMY          Family History  Problem Relation Age of Onset  . Heart disease Father   . Stroke Mother   . Heart disease Maternal Grandfather   . Breast cancer Sister    Social History        Socioeconomic History  . Marital status: Widowed    Spouse name: Not on file  . Number of children: 3  . Years of education: Not on file  . Highest education level: Not on file  Occupational History  . 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 Financial resource strain: Not on file  . Food insecurity:    Worry: Not on file    Inability: Not on file  . Transportation needs:    Medical: Not on file    Non-medical: Not on file  Tobacco Use  . Smoking status: Former Smoker    Packs/day: 0.50    Years: 20.00    Pack years: 10.00    Types: Cigarettes    Last attempt to quit: 03/15/1964    Years since quitting: 53.4  . Smokeless tobacco: Never Used  Substance and Sexual Activity  . Alcohol use: Yes    Comment: social  . Drug use: No  . Sexual activity: Not on file  Lifestyle  . Physical activity:    Days per week: Not on file    Minutes per session: Not on file  . Stress: Not on file  Relationships  . Social connections:    Talks on phone: Not on file    Gets together: Not on file    Attends religious service: Not on file    Active member of club or organization: Not on file    Attends meetings of clubs or organizations: Not on file    Relationship status: Not on file  . Intimate partner violence:    Fear of current or ex partner: Not on file    Emotionally abused: Not on file    Physically abused: Not on file    Forced sexual activity: Not on file  Other Topics Concern  . Not on file  Social History Narrative  . Not on file         Current Outpatient Medications  Medication Sig Dispense Refill  . acetaminophen (TYLENOL) 500 MG tablet Take 1,000 mg by mouth daily as needed (sinus headaches).    . ALPRAZolam (XANAX) 0.25 MG tablet Take 0.25 mg by mouth 3 (three) times daily as needed for anxiety or sleep.     Marland Kitchen buPROPion (WELLBUTRIN SR) 150 MG 12 hr tablet Take 150 mg by mouth daily before breakfast.     . citalopram (CELEXA) 10 MG tablet Take 10 mg by mouth at bedtime.     . Cyanocobalamin (B-12 PO) Take 1 Dose by mouth 2 (two) times a week.    . eszopiclone (LUNESTA) 2 MG TABS tablet Take 1-2 mg by mouth at bedtime.     Marland Kitchen  HYDROcodone-acetaminophen (NORCO/VICODIN) 5-325 MG tablet Take 1-2 tablets by mouth every 6 (six) hours as needed for moderate pain.    Marland Kitchen levothyroxine (SYNTHROID, LEVOTHROID) 50 MCG tablet Take 50  mcg by mouth once daily    . metoprolol succinate (TOPROL-XL) 25 MG 24 hr tablet Take 25 mg by mouth once daily    . Multiple Vitamins-Minerals (MULTIVITAMIN PO) Take 1 tablet by mouth daily.    Marland Kitchen olmesartan-hydrochlorothiazide (BENICAR HCT) 40-12.5 MG tablet Take 1 tablet by mouth daily.    . Cyanocobalamin (B-12 COMPLIANCE INJECTION IJ) Inject 1 Dose as directed every 30 (thirty) days.    Marland Kitchen ibuprofen (ADVIL,MOTRIN) 200 MG tablet Take 400 mg by mouth daily as needed for headache or moderate pain.     No current facility-administered medications for this visit.         Allergies  Allergen Reactions  . Betadine [Povidone Iodine]     blisters  . Latex Swelling    hives  . Zestril [Lisinopril]     cough   Review of Systems:   General: normal appetite, decreased energy, no weight gain, no weight loss, no fever  Cardiac: no chest pain with exertion, no chest pain at rest, +SOB with mild exertion, no resting SOB, no PND, no orthopnea, no palpitations, no arrhythmia, no atrial fibrillation, occasional LE edema, no dizzy spells, no syncope  Respiratory: +exertional shortness of breath, no home oxygen, no productive cough, no dry cough, no bronchitis, + wheezing, no hemoptysis, no asthma, no pain with inspiration or cough, no sleep apnea, no CPAP at night  GI: no difficulty swallowing, no reflux, no frequent heartburn, no hiatal hernia, no abdominal pain, no constipation, + diarrhea, no hematochezia, no hematemesis, no melena  GU: no dysuria, no frequency, hx of urinary tract infection, no hematuria, no kidney stones, + chronic kidney disease  Vascular: no pain suggestive of claudication, no pain in feet, no leg cramps, + varicose veins, no DVT, no non-healing foot ulcer  Neuro: no stroke, no TIA's, no  seizures, no headaches, no temporary blindness one eye, no slurred speech, no peripheral neuropathy, no chronic pain, some instability of gait due to DJD and uses rolling walker for balance, no memory/cognitive dysfunction  Musculoskeletal: + arthritis, no joint swelling, no myalgias, some difficulty walking, reduced mobility  Skin: no rash, no itching, no skin infections, no pressure sores or ulcerations  Psych: + anxiety, no depression, no nervousness, no unusual recent stress  Eyes: + blurry vision, no floaters, no recent vision changes, + wears glasses  ENT: + hearing loss, no loose or painful teeth, no dentures, last saw dentist March 2019  Hematologic: no easy bruising, no abnormal bleeding, no clotting disorder, no frequent epistaxis  Endocrine: no diabetes, does not check CBG's at home   Physical Exam:   BP (!) 180/90 (BP Location: Left Arm, Patient Position: Sitting, Cuff Size: Large)  Pulse 76  Resp 16  Ht 4\' 11"  (1.499 m)  SpO2 96% Comment: ON RA  BMI 38.38 kg/m  General: Elderly but well-appearing  HEENT: Unremarkable, NCAT, PERLA, EOMI, oropharynx clear, teeth in good condition  Neck: no JVD, no bruits, no adenopathy or thyromegaly  Chest: clear to auscultation, symmetrical breath sounds, no wheezes, no rhonchi  CV: RRR, grade III/VI crescendo/decrescendo murmur heard best at RSB, no diastolic murmur  Abdomen: soft, non-tender, no masses or organomegaly  Extremities: warm, well-perfused, pulses palpable in feet, mild LE edema  Rectal/GU Deferred  Neuro: Grossly non-focal and symmetrical throughout  Skin: Clean and dry, no rashes, no breakdown    Diagnostic Tests:   [name]  [address], [place] [address]  [phone]  -------------------------------------------------------------------  Transthoracic Echocardiography  Patient: [name], [name]  MR #: [id number]  Study Date: 06/08/2017  Gender: F  Age: 16  Height: 151.1 cm  Weight: 86.2  kg  BSA: 1.95 m^2  Pt. Status:  Room:  Ivar Bury, MD  Mason W. Smith, MD  ATTENDING Loralie Champagne, M.D.  SONOGRAPHER Wyatt Mage, RDCS  PERFORMING [facility], Outpatient  cc:  -------------------------------------------------------------------  LV EF: 60% - 65%  -------------------------------------------------------------------  Indications: Aortic stenosis (I35).  -------------------------------------------------------------------  History: PMH: Dyspnea. Aortic valve disease. Risk factors:  Former tobacco use. Hypertension. Dyslipidemia.  -------------------------------------------------------------------  Study Conclusions  - Left ventricle: The cavity size was normal. Wall thickness was  increased in a pattern of mild LVH. There was moderate focal  basal hypertrophy of the septum. Systolic function was normal.  The estimated ejection fraction was in the range of 60% to 65%.  Doppler parameters are consistent with abnormal left ventricular  relaxation (grade 1 diastolic dysfunction). The E/e&' ratio is  >15, suggesting elevated LV filling pressure.  - Aortic valve: Moderate to severe aortic stenosis. Trivial  regurgitation. Mean gradient (S): 27 mm Hg. Peak gradient (S): 52  mm Hg. Valve area (VTI): 1.03 cm^2. Valve area (Vmax): 1.05 cm^2.  Valve area (Vmean): 1.02 cm^2.  - Mitral valve: Calcified annulus. Mildly thickened leaflets .  There was mild regurgitation.  - Right atrium: The atrium was mildly dilated.  - Tricuspid valve: There was mild regurgitation.  - Pulmonary arteries: PA peak pressure: 49 mm Hg (S).  - Inferior vena cava: The vessel was normal in size. The  respirophasic diameter changes were in the normal range (= 50%),  consistent with normal central venous pressure.  Impressions:  - Compared to a prior study in 2016, the LVEF is higher at 60-65%.  There is now moderate to severe aortic stenosis with mean  gradient of 27 mmHg - AVA around  1.0-1.1 cm2.  -------------------------------------------------------------------  Labs, prior tests, procedures, and surgery:  Transthoracic echocardiography (03/27/2014). EF was 55% and PA  pressure was 53 (systolic).  -------------------------------------------------------------------  Study data: Comparison was made to the study of 03/27/2014. Study  status: Routine. Procedure: The patient reported no pain pre or  post test. Transthoracic echocardiography. Image quality was  adequate. Study completion: There were no complications.  Transthoracic echocardiography. M-mode, complete 2D, spectral  Doppler, and color Doppler. Birthdate: Patient birthdate:  [birth year]. Age: Patient is 83 yr old. Sex: Gender: female.  BMI: 37.8 kg/m^2. Blood pressure: 180/96 Patient status:  Outpatient. Study date: Study date: 06/08/2017. Study time: 02:06  PM. Location: [facility]  -------------------------------------------------------------------  -------------------------------------------------------------------  Left ventricle: The cavity size was normal. Wall thickness was  increased in a pattern of mild LVH. There was moderate focal basal  hypertrophy of the septum. Systolic function was normal. The  estimated ejection fraction was in the range of 60% to 65%. Doppler  parameters are consistent with abnormal left ventricular relaxation  (grade 1 diastolic dysfunction). The E/e&' ratio is >15, suggesting  elevated LV filling pressure.  -------------------------------------------------------------------  Aortic valve: Moderate to severe aortic stenosis. Trivial  regurgitation. Doppler: VTI ratio of LVOT to aortic valve:  0.25. Valve area (VTI): 1.03 cm^2. Indexed valve area (VTI): 0.53  cm^2/m^2. Peak velocity ratio of LVOT to aortic valve: 0.25. Valve  area (Vmax): 1.05 cm^2. Indexed valve area (Vmax): 0.54 cm^2/m^2.  Mean velocity ratio of LVOT to aortic valve: 0.24. Valve area  (Vmean):  1.02 cm^2. Indexed valve area (Vmean): 0.52  cm^2/m^2.  Mean gradient (S): 27 mm Hg. Peak gradient (S): 52 mm Hg.  -------------------------------------------------------------------  Aorta: Aortic root: The aortic root was normal in size.  Ascending aorta: The ascending aorta was normal in size.  -------------------------------------------------------------------  Mitral valve: Calcified annulus. Mildly thickened leaflets .  Doppler: There was mild regurgitation. Valve area by pressure  half-time: 2.37 cm^2. Indexed valve area by pressure half-time:  1.22 cm^2/m^2. Valve area by continuity equation (using LVOT flow):  1.59 cm^2. Indexed valve area by continuity equation (using LVOT  flow): 0.82 cm^2/m^2. Mean gradient (D): 4 mm Hg. Peak gradient  (D): 9 mm Hg.  -------------------------------------------------------------------  Left atrium: The atrium was normal in size.  -------------------------------------------------------------------  Atrial septum: No defect or patent foramen ovale was identified.  -------------------------------------------------------------------  Right ventricle: The cavity size was normal. Wall thickness was  normal. Systolic function was normal.  -------------------------------------------------------------------  Pulmonic valve: The valve appears to be grossly normal.  Doppler: There was trivial regurgitation.  -------------------------------------------------------------------  Tricuspid valve: Doppler: There was mild regurgitation.  -------------------------------------------------------------------  Pulmonary artery: The main pulmonary artery was normal-sized.  -------------------------------------------------------------------  Right atrium: The atrium was mildly dilated.  -------------------------------------------------------------------  Pericardium: There was no pericardial effusion.    -------------------------------------------------------------------  Systemic veins:  Inferior vena cava: The vessel was normal in size. The  respirophasic diameter changes were in the normal range (= 50%),  consistent with normal central venous pressure.  -------------------------------------------------------------------  Measurements  Left ventricle Value Reference  LV ID, ED, PLAX chordal (L) 36 mm 43 - 52  LV ID, ES, PLAX chordal 28 mm 23 - 38  LV fx shortening, PLAX chordal (L) 22 % >=29  LV PW thickness, ED 13 mm ---------  IVS/LV PW ratio, ED 1.08 <=1.3  Stroke volume, 2D 78 ml ---------  Stroke volume/bsa, 2D 40 ml/m^2 ---------  LV e&', lateral 7.4 cm/s ---------  LV E/e&', lateral 20.68 ---------  LV e&', medial 6.09 cm/s ---------  LV E/e&', medial 25.12 ---------  LV e&', average 6.75 cm/s ---------  LV E/e&', average 22.68 ---------  Ventricular septum Value Reference  IVS thickness, ED 14 mm ---------  LVOT Value Reference  LVOT ID, S 23 mm ---------  LVOT area 4.15 cm^2 ---------  LVOT peak velocity, S 91.7 cm/s ---------  LVOT mean velocity, S 59.4 cm/s ---------  LVOT VTI, S 22.5 cm ---------  Stroke volume (SV), LVOT DP 93.5 ml ---------  Stroke index (SV/bsa), LVOT DP 48 ml/m^2 ---------  Aortic valve Value Reference  Aortic valve peak velocity, S 362 cm/s ---------  Aortic valve mean velocity, S 243 cm/s ---------  Aortic valve VTI, S 90.7 cm ---------  Aortic mean gradient, S 27 mm Hg ---------  Aortic peak gradient, S 52 mm Hg ---------  VTI ratio, LVOT/AV 0.25 ---------  Aortic valve area, VTI 1.03 cm^2 ---------  Aortic valve area/bsa, VTI 0.53 cm^2/m^2 ---------  Velocity ratio, peak, LVOT/AV 0.25 ---------  Aortic valve area, peak velocity 1.05 cm^2 ---------  Aortic valve area/bsa, peak 0.54 cm^2/m^2 ---------  velocity  Velocity ratio, mean, LVOT/AV 0.24 ---------  Aortic valve area, mean velocity 1.02 cm^2 ---------  Aortic valve area/bsa,  mean 0.52 cm^2/m^2 ---------  velocity  Aortic regurg pressure half-time 359 ms ---------  Aorta Value Reference  Aortic root ID, ED 34 mm ---------  Ascending aorta ID, A-P, S 30 mm ---------  Left atrium Value Reference  LA ID, A-P, ES 38 mm ---------  LA ID/bsa, A-P 1.95 cm/m^2 <=2.2  LA volume,  S 61.5 ml ---------  LA volume/bsa, S 31.6 ml/m^2 ---------  LA volume, ES, 1-p A4C 50.4 ml ---------  LA volume/bsa, ES, 1-p A4C 25.9 ml/m^2 ---------  LA volume, ES, 1-p A2C 68.9 ml ---------  LA volume/bsa, ES, 1-p A2C 35.3 ml/m^2 ---------  Mitral valve Value Reference  Mitral E-wave peak velocity 153 cm/s ---------  Mitral A-wave peak velocity 139 cm/s ---------  Mitral mean velocity, D 94.4 cm/s ---------  Mitral deceleration time (H) 285 ms 150 - 230  Mitral pressure half-time 102 ms ---------  Mitral mean gradient, D 4 mm Hg ---------  Mitral peak gradient, D 9 mm Hg ---------  Mitral E/A ratio, peak 1.1 ---------  Mitral valve area, PHT, DP 2.37 cm^2 ---------  Mitral valve area/bsa, PHT, DP 1.22 cm^2/m^2 ---------  Mitral valve area, LVOT 1.59 cm^2 ---------  continuity  Mitral valve area/bsa, LVOT 0.82 cm^2/m^2 ---------  continuity  Mitral annulus VTI, D 49.1 cm ---------  Pulmonary arteries Value Reference  PA pressure, S, DP (H) 49 mm Hg <=30  Tricuspid valve Value Reference  Tricuspid regurg peak velocity 319 cm/s ---------  Tricuspid peak RV-RA gradient 41 mm Hg ---------  Systemic veins Value Reference  Estimated CVP 8 mm Hg ---------  Right ventricle Value Reference  TAPSE 26.1 mm ---------  RV pressure, S, DP (H) 49 mm Hg <=30  RV s&', lateral, S 10.7 cm/s ---------  Legend:  (L) and (H) mark values outside specified reference range.  -------------------------------------------------------------------  Prepared and Electronically Authenticated by  Lyman Bishop MD  2019-03-27T15:44:40    Physicians  Panel Physicians Referring Physician Case Authorizing  Physician  Belva Crome, MD (Primary)    Procedures  RIGHT/LEFT HEART CATH AND CORONARY ANGIOGRAPHY  Conclusion  Calcified thoracic, arch, and descending aorta  RCA has stent extending into the ascending aortic root. There is ostial 70% narrowing. The RCA is dominant without any significant residual disease.  Left main is widely patent.  LAD beyond the first diagonal contains eccentric 50-70% narrowing. The first diagonal contains a branch that has 90% stenosis.  Circumflex ostium contains 40-50% narrowing. 3 relatively small obtuse marginal branches are widely patent.  Mild pulmonary hypertension.  Moderate aortic stenosis based upon hemodynamics with calculated aortic valve area 1.3 cm square. Peak to peak gradient 30 mmHg. Possible stage D3 severe aortic stenosis. RECOMMENDATIONS:  Follow-up with Dr. Angelena Form to determine further steps towards consideration of percutaneous aortic valve replacement.  Indications  Aortic stenosis, severe [I35.0 (ICD-10-CM)]  CAD in native artery [I25.10 (ICD-10-CM)]  Procedural Details/Technique  Technical Details The right radial area was sterilely prepped and draped. Intravenous sedation with Versed and fentanyl was administered. 1% Xylocaine was infiltrated to achieve local analgesia. Using real-time vascular ultrasound, a double wall stick with an angiocath was utilized to obtain intra-arterial access. A VUS image was saved for the permanent record.The modified Seldinger technique was used to place a 5F " Slender" sheath in the right radial artery. Weight based heparin was administered. Coronary angiography was done using 5 F catheters. Right coronary angiography was performed with a JR4. Left ventricular hemodymic recordings and angiography was done using the JR 4 catheter and hand injection. Left coronary angiography was performed with a JL 3.5 cm.  Right heart catheterization was performed by exchanging a previously placed antecubital IV angio-cath for  a 5 French Slender sheath. 1% Xylocaine was used to locally nesthetize the area around the IV site. The IV catheter was wired using an .018 guidewire. The modified  Seldinger technique was used to place the 5 [place] sheath. Double glove technique was used to enhance sterility. After sheath insertion, right heart cath was performed using a 5 French balloon tipped catheter and fluoroscopic guidance. Pressures were recorded in each chamber and in the pulmonary capillary wedge position.. The main pulmonary artery O2 saturation was sampled.   Hemostasis was achieved using a pneumatic band.  During this procedure the patient is administered a total of Versed 2 mg and Fentanyl 50 mg to achieve and maintain moderate conscious sedation. The patient's heart rate, blood pressure, and oxygen saturation are monitored continuously during the procedure. The period of conscious sedation is 46 minutes, of which I was present face-to-face 100% of this time.   Estimated blood loss <50 mL.  During this procedure the patient was administered the following to achieve and maintain moderate conscious sedation: Versed 2 mg, Fentanyl 50 mcg, while the patient's heart rate, blood pressure, and oxygen saturation were continuously monitored. The period of conscious sedation was 46 minutes, of which I was present face-to-face 100% of this time.  Coronary Findings  Diagnostic  Dominance: Right  Left Main  Dist LM to Ost LAD lesion 40% stenosed  Dist LM to Ost LAD lesion is 40% stenosed.  Left Anterior Descending  Prox LAD lesion 70% stenosed  Prox LAD lesion is 70% stenosed.  First Diagonal Branch  1st Diag lesion 85% stenosed  1st Diag lesion is 85% stenosed.  Left Circumflex  Ost Cx lesion 50% stenosed  Ost Cx lesion is 50% stenosed.  Right Coronary Artery  Ost RCA to Prox RCA lesion 70% stenosed  Ost RCA to Prox RCA lesion is 70% stenosed. The lesion was previously treated.  Mid RCA lesion 30% stenosed  Mid RCA  lesion is 30% stenosed.  Intervention  No interventions have been documented.  Right Heart  Right Heart Pressures Hemodynamic findings consistent with mild pulmonary hypertension. Elevated LV EDP consistent with volume overload.  Coronary Diagrams  Diagnostic Diagram     Implants     No implant documentation for this case.  MERGE Images  Link to Procedure Log   Show images for CARDIAC CATHETERIZATION Procedure Log  Hemo Data   Most Recent Value  Fick Cardiac Output 7.62 L/min  Fick Cardiac Output Index 4.23 (L/min)/BSA  Aortic Mean Gradient 28.2 mmHg  Aortic Peak Gradient 30 mmHg  Aortic Valve Area 1.33  Aortic Value Area Index 0.74 cm2/BSA  RA A Wave 10 mmHg  RA V Wave 8 mmHg  RA Mean 7 mmHg  RV Systolic Pressure 40 mmHg  RV Diastolic Pressure 4 mmHg  RV EDP 9 mmHg  PA Systolic Pressure 40 mmHg  PA Diastolic Pressure 18 mmHg  PA Mean 28 mmHg  PW A Wave 19 mmHg  PW V Wave 21 mmHg  PW Mean 17 mmHg  AO Systolic Pressure 425 mmHg  AO Diastolic Pressure 64 mmHg  AO Mean 956 mmHg  LV Systolic Pressure 387 mmHg  LV Diastolic Pressure 6 mmHg  LV EDP 18 mmHg  Arterial Occlusion Pressure Extended Systolic Pressure 564 mmHg  Arterial Occlusion Pressure Extended Diastolic Pressure 62 mmHg  Arterial Occlusion Pressure Extended Mean Pressure 102 mmHg  Left Ventricular Apex Extended Systolic Pressure 332 mmHg  Left Ventricular Apex Extended Diastolic Pressure 5 mmHg  Left Ventricular Apex Extended EDP Pressure 17 mmHg  QP/QS 1  TPVR Index 6.61 HRUI  TSVR Index 24.32 HRUI  PVR SVR Ratio 0.11  TPVR/TSVR Ratio 0.27  ADDENDUM REPORT:  07/25/2017 17:34  CLINICAL DATA: 84 year old female with severe aortic stenosis being  evaluated for a TAVR procedure.  EXAM:  Cardiac TAVR CT  TECHNIQUE:  The patient was scanned on a [employer]. A 120 kV  retrospective scan was triggered in the descending thoracic aorta at  111 HU's. Gantry rotation speed was 250 msecs and  collimation was .6  mm. 5 mg of iv Metoprolol and no nitro were given. The 3D data set  was reconstructed in 5% intervals of the R-R cycle. Systolic and  diastolic phases were analyzed on a dedicated work station using  MPR, MIP and VRT modes. The patient received 80 cc of contrast.  FINDINGS:  Aortic Valve: Trileaflet aortic valve with severely thickened and  moderately calcified leaflets with severely reduced leaflets  excursion. Only minimal calcifications are extending into the LVOT.  Aorta: Normal size with mild calcifications in the ascending aorta  but moderate to severe atherosclerosis and almost circumferential  calcifications in the aortic arch and descending thoracic aorta. No  dissection.  Sinotubular Junction: 26 x 25 mm  Ascending Thoracic Aorta: 30 x 29 mm  Aortic Arch: 20 x 20 mm  Descending Thoracic Aorta: 23 x 21 mm  Sinus of Valsalva Measurements:  Non-coronary: 30 mm  Right -coronary: 31 mm  Left -coronary: 32 mm  Coronary Artery Height above Annulus:  Left Main: 17 mm  Right Coronary: 18 mm  Virtual Basal Annulus Measurements:  Maximum/Minimum Diameter: 27.2 x 22.1 mm  Mean Diameter: 24.8 mm  Perimeter: 79.1 mm  Area: 484 mm2  Coronary Arteries: NTG not used and scan not sufficient for coronary  evaluation, however right ostial coronary stent is extending into  the right coronary sinus (3.4 mm). However, RCA is 18 mm above the  annulus.  Optimum Fluoroscopic Angle for Delivery: LAO 8 CAU 7  Dilated pulmonary artery measuring 31 x 24 mm  IMPRESSION:  1. Trileaflet aortic valve with severely thickened and moderately  calcified leaflets with severely reduced leaflets excursion. Only  minimal calcifications are extending into the LVOT. Annular  measurements suitable for delivery of a 26 mm Edwards-SAPIEN 3  valve.  2. Sufficient coronary to annulus distance.  3. Optimum Fluoroscopic Angle for Delivery: LAO 8 CAU 7.  4. No thrombus in the left atrial  appendage.  5. Normal size of the thoracic aorta with mild calcifications in the  ascending aorta but moderate to severe atherosclerosis and almost  circumferential calcifications in the aortic arch and descending  thoracic aorta. No dissection.  6. Right ostial coronary stent is extending into the right coronary  sinus (3.4 mm). However, RCA is 18 mm above the annulus and should  not interfere with valve placement.  Electronically Signed  By: Ena Dawley  On: 07/25/2017 17:34  CLINICAL DATA: 84 year old female with history of severe aortic  stenosis. Preprocedural study prior to potential transcatheter  aortic valve replacement (TAVR) procedure.  EXAM:  CT ANGIOGRAPHY CHEST, ABDOMEN AND PELVIS  TECHNIQUE:  Multidetector CT imaging through the chest, abdomen and pelvis was  performed using the standard protocol during bolus administration of  intravenous contrast. Multiplanar reconstructed images and MIPs were  obtained and reviewed to evaluate the vascular anatomy.  CONTRAST: 178mL ISOVUE-370 IOPAMIDOL (ISOVUE-370) INJECTION 76%  COMPARISON: CT the chest, abdomen and pelvis 03/13/2013.  FINDINGS:  CTA CHEST FINDINGS  Cardiovascular: Heart size is mildly enlarged. There is no  significant pericardial fluid, thickening or pericardial  calcification. There is aortic atherosclerosis, as well as  atherosclerosis of the great vessels of the mediastinum and the  coronary arteries, including calcified atherosclerotic plaque in the  left anterior descending and right coronary arteries. Severe  thickening calcification of the aortic valve. Calcification of the  mitral annulus.  Mediastinum/Lymph Nodes: No pathologically enlarged mediastinal or  hilar lymph nodes. Patulous esophagus. No axillary lymphadenopathy.  Lungs/Pleura: No acute consolidative airspace disease. No pleural  effusions. No suspicious appearing pulmonary nodules or masses.  Musculoskeletal/Soft Tissues: There are no  aggressive appearing  lytic or blastic lesions noted in the visualized portions of the  skeleton.  CTA ABDOMEN AND PELVIS FINDINGS  Hepatobiliary: Subcentimeter low-attenuation lesions are noted in  the liver, too small to characterize, but statistically likely to  represent tiny cysts. No other larger more suspicious appearing  hepatic lesions are noted. No intra or extrahepatic biliary ductal  dilatation. Status post cholecystectomy.  Pancreas: No pancreatic mass. No pancreatic ductal dilatation. No  pancreatic or peripancreatic fluid or inflammatory changes.  Spleen: Unremarkable.  Adrenals/Urinary Tract: Multiple well-defined low-attenuation  nonenhancing lesions in both kidneys are compatible with simple  cysts, largest of which measures 5.4 cm in the interpolar region of  the right kidney. There are several other subcentimeter  low-attenuation lesions in both kidneys which are too small to  definitively characterize, but are statistically likely to represent  tiny cysts. Calcifications in the right adrenal gland, likely from  prior infection or hemorrhage. Left adrenal gland is normal in  appearance. No hydroureteronephrosis. Urinary bladder is normal in  appearance.  Stomach/Bowel: The appearance of the stomach is normal. There is no  pathologic dilatation of small bowel or colon. Numerous colonic  diverticulae are noted, without surrounding inflammatory changes to  suggest an acute diverticulitis at this time. The appendix is not  confidently identified and may be surgically absent. Regardless,  there are no inflammatory changes noted adjacent to the cecum to  suggest the presence of an acute appendicitis at this time.  Vascular/Lymphatic: Extensive aortic atherosclerosis, with vascular  findings and measurements pertinent to potential TAVR procedure, as  detailed below. Celiac axis, superior mesenteric artery and inferior  mesenteric artery are all patent without definite  hemodynamically  significant stenosis. Single renal arteries are both patent without  hemodynamically significant stenosis. No lymphadenopathy noted in  the abdomen or pelvis.  Reproductive: Status post hysterectomy. Ovaries are not confidently  identified may be surgically absent or atrophic.  Other: Small epigastric ventral hernia containing only fat. No  significant volume of ascites. No pneumoperitoneum.  Musculoskeletal: There are no aggressive appearing lytic or blastic  lesions noted in the visualized portions of the skeleton.  VASCULAR MEASUREMENTS PERTINENT TO TAVR:  AORTA:  Minimal Aortic Diameter-12 x 12 mm  Severity of Aortic Calcification-severe  RIGHT PELVIS:  Right Common Iliac Artery -  Minimal Diameter-12.1 x 9.7 mm  Tortuosity-mild  Calcification-moderate  Right External Iliac Artery -  Minimal Diameter-8.4 x 8.8 mm  Tortuosity-mild  Calcification-none  Right Common Femoral Artery -  Minimal Diameter-8.5 x 8.7 mm  Tortuosity - mild  Calcification-mild  LEFT PELVIS:  Left Common Iliac Artery -  Minimal Diameter-10.5 x 8.1 mm  Tortuosity - mild  Calcification-moderate  Left External Iliac Artery -  Minimal Diameter-8.1 x 7.8 mm  Tortuosity - mild  Calcification-none  Left Common Femoral Artery -  Minimal Diameter-8.4 x 8.0 mm  Tortuosity - mild  Calcification-mild  Review of the MIP images confirms the above findings.  IMPRESSION:  1. Vascular findings and measurements pertinent  to potential TAVR  procedure, as detailed above.  2. Severe thickening calcification of the aortic valve, compatible  with the reported clinical history of severe aortic stenosis.  3. Aortic atherosclerosis, in addition to 2 vessel coronary artery  disease.  4. Mild cardiomegaly.  5. Small epigastric ventral hernia containing only omental fat. No  associated bowel incarceration or obstruction at this time.  6. Additional incidental findings, as above.  Aortic Atherosclerosis  (ICD10-I70.0).  Electronically Signed  By: Vinnie Langton M.D.  On: 07/25/2017 14:51    Impression:   This 84 year old woman has stage D, severe, symptomatic aortic stenosis with New York Heart Association class III symptoms of exertional fatigue and shortness of breath with low level activity. Her symptoms have been progressive over the past 6 months and are now markedly limiting her activity. I have personally reviewed her echocardiogram, cardiac cath, and CTA studies. Her echocardiogram shows a trileaflet aortic valve that is severely calcified with markedly restricted leaflet mobility and looks like a severely stenotic aortic valve. Her mean gradient is only 27 mmHg but her dimensionless index is 0.25 and given the appearance of her valve and her symptoms I think she really has low gradient, normal EF, severe aortic stenosis. Her cardiac catheterization shows moderate coronary artery disease with a stent in the ostium of the right coronary artery which is protruding slightly. There is about 70% ostial RCA narrowing. The patient has no anginal symptoms. I think aortic valve replacement is indicated in this patient since she is still living independently and would like to maintain her activity level which has been markedly limited by exertional fatigue and shortness of breath. I think her operative risk would be at least moderately elevated for open surgical aortic valve replacement +/- coronary bypass graft surgery. I think transcatheter aortic valve replacement would be the best option for this patient. Her gated cardiac CTA shows anatomy that is suitable for transcatheter aortic valve replacement with no complicating features. There is a stent slightly protruding from the RCA ostium but this is 18 mm above the annulus. Her abdominal and pelvic CTA shows adequate pelvic arterial anatomy to allow transfemoral insertion. Pulmonary function testing shows minimal obstructive disease and a minimal decrease  in her diffusion capacity. Carotid arterial Dopplers showed no stenosis.   The patient and her daughter were counseled at length regarding treatment alternatives for management of severe symptomatic aortic stenosis. The risks and benefits of surgical intervention has been discussed in detail. Long-term prognosis with medical therapy was discussed. Alternative approaches such as conventional surgical aortic valve replacement, transcatheter aortic valve replacement, and palliative medical therapy were compared and contrasted at length. This discussion was placed in the context of the patient's own specific clinical presentation and past medical history. All of their questions have been addressed.   A discussion was held regarding what types of management strategies would be attempted intraoperatively in the event of life-threatening complications, including whether or not the patient would be considered a candidate for the use of cardiopulmonary bypass and/or conversion to open sternotomy for attempted surgical intervention.   The patient has been advised of a variety of complications that might develop including but not limited to risks of death, stroke, paravalvular leak, aortic dissection or other major vascular complications, aortic annulus rupture, device embolization, cardiac rupture or perforation, mitral regurgitation, acute myocardial infarction, arrhythmia, heart block or bradycardia requiring permanent pacemaker placement, congestive heart failure, respiratory failure, renal failure, pneumonia, infection, other late complications related to structural valve deterioration  or migration, or other complications that might ultimately cause a temporary or permanent loss of functional independence or other long term morbidity. The patient provides full informed consent for the procedure as described and all questions were answered.   Plan:   Transfemoral TAVR  Gaye Pollack, MD

## 2017-08-01 NOTE — Progress Notes (Signed)
Called patient (hipaa verified name/dob) and clarified that although her instructions say she can have clear liquids up until 0930 AM Aug 02, 2017 she is actually to have nothing to eat or drink after midnight tonight.  She verbalized understanding.

## 2017-08-02 ENCOUNTER — Inpatient Hospital Stay (HOSPITAL_COMMUNITY): Payer: Medicare Other | Admitting: Emergency Medicine

## 2017-08-02 ENCOUNTER — Inpatient Hospital Stay (HOSPITAL_COMMUNITY)
Admission: RE | Admit: 2017-08-02 | Discharge: 2017-08-04 | DRG: 266 | Disposition: A | Discharge status: Home / Self Care | Payer: Medicare Other | Source: Ambulatory Visit | Attending: Surgery | Admitting: Surgery

## 2017-08-02 ENCOUNTER — Encounter (HOSPITAL_COMMUNITY)
Admission: RE | Disposition: A | Discharge status: Home / Self Care | Payer: Self-pay | Source: Ambulatory Visit | Attending: Surgery

## 2017-08-02 ENCOUNTER — Inpatient Hospital Stay (HOSPITAL_COMMUNITY): Payer: Medicare Other | Admitting: Anesthesiology

## 2017-08-02 ENCOUNTER — Ambulatory Visit (HOSPITAL_COMMUNITY): Payer: Medicare Other

## 2017-08-02 ENCOUNTER — Inpatient Hospital Stay (HOSPITAL_COMMUNITY): Payer: Medicare Other

## 2017-08-02 ENCOUNTER — Encounter (HOSPITAL_COMMUNITY): Payer: Self-pay | Admitting: Urology

## 2017-08-02 DIAGNOSIS — Z853 Personal history of malignant neoplasm of breast: Secondary | ICD-10-CM | POA: Diagnosis not present

## 2017-08-02 DIAGNOSIS — Z952 Presence of prosthetic heart valve: Secondary | ICD-10-CM

## 2017-08-02 DIAGNOSIS — R053 Chronic cough: Secondary | ICD-10-CM | POA: Diagnosis present

## 2017-08-02 DIAGNOSIS — Z8249 Family history of ischemic heart disease and other diseases of the circulatory system: Secondary | ICD-10-CM

## 2017-08-02 DIAGNOSIS — Z87891 Personal history of nicotine dependence: Secondary | ICD-10-CM | POA: Diagnosis not present

## 2017-08-02 DIAGNOSIS — Z006 Encounter for examination for normal comparison and control in clinical research program: Secondary | ICD-10-CM | POA: Diagnosis not present

## 2017-08-02 DIAGNOSIS — K219 Gastro-esophageal reflux disease without esophagitis: Secondary | ICD-10-CM | POA: Diagnosis present

## 2017-08-02 DIAGNOSIS — Z9049 Acquired absence of other specified parts of digestive tract: Secondary | ICD-10-CM

## 2017-08-02 DIAGNOSIS — Z91048 Other nonmedicinal substance allergy status: Secondary | ICD-10-CM | POA: Diagnosis not present

## 2017-08-02 DIAGNOSIS — M1990 Unspecified osteoarthritis, unspecified site: Secondary | ICD-10-CM | POA: Diagnosis present

## 2017-08-02 DIAGNOSIS — Z90710 Acquired absence of both cervix and uterus: Secondary | ICD-10-CM | POA: Diagnosis not present

## 2017-08-02 DIAGNOSIS — I5032 Chronic diastolic (congestive) heart failure: Secondary | ICD-10-CM

## 2017-08-02 DIAGNOSIS — Z803 Family history of malignant neoplasm of breast: Secondary | ICD-10-CM

## 2017-08-02 DIAGNOSIS — I350 Nonrheumatic aortic (valve) stenosis: Secondary | ICD-10-CM | POA: Diagnosis present

## 2017-08-02 DIAGNOSIS — I10 Essential (primary) hypertension: Secondary | ICD-10-CM | POA: Diagnosis present

## 2017-08-02 DIAGNOSIS — Z91040 Latex allergy status: Secondary | ICD-10-CM

## 2017-08-02 DIAGNOSIS — I130 Hypertensive heart and chronic kidney disease with heart failure and stage 1 through stage 4 chronic kidney disease, or unspecified chronic kidney disease: Secondary | ICD-10-CM | POA: Diagnosis present

## 2017-08-02 DIAGNOSIS — E039 Hypothyroidism, unspecified: Secondary | ICD-10-CM | POA: Diagnosis present

## 2017-08-02 DIAGNOSIS — Z85828 Personal history of other malignant neoplasm of skin: Secondary | ICD-10-CM | POA: Diagnosis not present

## 2017-08-02 DIAGNOSIS — Z96653 Presence of artificial knee joint, bilateral: Secondary | ICD-10-CM | POA: Diagnosis present

## 2017-08-02 DIAGNOSIS — I2510 Atherosclerotic heart disease of native coronary artery without angina pectoris: Secondary | ICD-10-CM | POA: Diagnosis present

## 2017-08-02 DIAGNOSIS — Z954 Presence of other heart-valve replacement: Secondary | ICD-10-CM | POA: Diagnosis not present

## 2017-08-02 DIAGNOSIS — E785 Hyperlipidemia, unspecified: Secondary | ICD-10-CM | POA: Diagnosis present

## 2017-08-02 DIAGNOSIS — I5033 Acute on chronic diastolic (congestive) heart failure: Secondary | ICD-10-CM | POA: Diagnosis present

## 2017-08-02 DIAGNOSIS — Z888 Allergy status to other drugs, medicaments and biological substances status: Secondary | ICD-10-CM | POA: Diagnosis not present

## 2017-08-02 DIAGNOSIS — R051 Acute cough: Secondary | ICD-10-CM | POA: Diagnosis present

## 2017-08-02 DIAGNOSIS — Z955 Presence of coronary angioplasty implant and graft: Secondary | ICD-10-CM

## 2017-08-02 DIAGNOSIS — R05 Cough: Secondary | ICD-10-CM | POA: Diagnosis present

## 2017-08-02 DIAGNOSIS — N183 Chronic kidney disease, stage 3 (moderate): Secondary | ICD-10-CM | POA: Diagnosis present

## 2017-08-02 DIAGNOSIS — I361 Nonrheumatic tricuspid (valve) insufficiency: Secondary | ICD-10-CM | POA: Diagnosis not present

## 2017-08-02 DIAGNOSIS — Z8701 Personal history of pneumonia (recurrent): Secondary | ICD-10-CM | POA: Diagnosis not present

## 2017-08-02 HISTORY — PX: INTRAOPERATIVE TRANSTHORACIC ECHOCARDIOGRAM: SHX6523

## 2017-08-02 HISTORY — DX: Personal history of malignant neoplasm of breast: Z85.3

## 2017-08-02 HISTORY — PX: TRANSCATHETER AORTIC VALVE REPLACEMENT, TRANSFEMORAL: SHX6400

## 2017-08-02 HISTORY — DX: Presence of prosthetic heart valve: Z95.2

## 2017-08-02 HISTORY — DX: Nonrheumatic aortic (valve) stenosis: I35.0

## 2017-08-02 LAB — URINALYSIS, ROUTINE W REFLEX MICROSCOPIC
Bilirubin Urine: NEGATIVE
Glucose, UA: NEGATIVE mg/dL
KETONES UR: NEGATIVE mg/dL
Nitrite: NEGATIVE
PH: 6 (ref 5.0–8.0)
Protein, ur: NEGATIVE mg/dL
Specific Gravity, Urine: 1.008 (ref 1.005–1.030)

## 2017-08-02 LAB — POCT I-STAT, CHEM 8
BUN: 15 mg/dL (ref 6–20)
BUN: 14 mg/dL (ref 6–20)
CHLORIDE: 102 mmol/L (ref 101–111)
CHLORIDE: 104 mmol/L (ref 101–111)
CREATININE: 0.8 mg/dL (ref 0.44–1.00)
Calcium, Ion: 1.25 mmol/L (ref 1.15–1.40)
Calcium, Ion: 1.26 mmol/L (ref 1.15–1.40)
Creatinine, Ser: 0.9 mg/dL (ref 0.44–1.00)
Glucose, Bld: 100 mg/dL — ABNORMAL HIGH (ref 65–99)
Glucose, Bld: 101 mg/dL — ABNORMAL HIGH (ref 65–99)
HCT: 26 % — ABNORMAL LOW (ref 36.0–46.0)
HEMATOCRIT: 31 % — AB (ref 36.0–46.0)
HEMOGLOBIN: 10.5 g/dL — AB (ref 12.0–15.0)
Hemoglobin: 8.8 g/dL — ABNORMAL LOW (ref 12.0–15.0)
POTASSIUM: 3.9 mmol/L (ref 3.5–5.1)
POTASSIUM: 3.7 mmol/L (ref 3.5–5.1)
SODIUM: 138 mmol/L (ref 135–145)
Sodium: 139 mmol/L (ref 135–145)
TCO2: 23 mmol/L (ref 22–32)
TCO2: 24 mmol/L (ref 22–32)

## 2017-08-02 LAB — POCT I-STAT 4, (NA,K, GLUC, HGB,HCT)
GLUCOSE: 113 mg/dL — AB (ref 65–99)
HEMATOCRIT: 27 % — AB (ref 36.0–46.0)
Hemoglobin: 9.2 g/dL — ABNORMAL LOW (ref 12.0–15.0)
POTASSIUM: 3.9 mmol/L (ref 3.5–5.1)
Sodium: 139 mmol/L (ref 135–145)

## 2017-08-02 LAB — CBC
HEMATOCRIT: 29.2 % — AB (ref 36.0–46.0)
HEMOGLOBIN: 9.5 g/dL — AB (ref 12.0–15.0)
MCH: 29.1 pg (ref 26.0–34.0)
MCHC: 32.5 g/dL (ref 30.0–36.0)
MCV: 89.6 fL (ref 78.0–100.0)
Platelets: 198 10*3/uL (ref 150–400)
RBC: 3.26 MIL/uL — AB (ref 3.87–5.11)
RDW: 13.2 % (ref 11.5–15.5)
WBC: 7.9 10*3/uL (ref 4.0–10.5)

## 2017-08-02 SURGERY — IMPLANTATION, AORTIC VALVE, TRANSCATHETER, FEMORAL APPROACH
Anesthesia: Monitor Anesthesia Care | Site: Chest

## 2017-08-02 MED ORDER — HEPARIN SODIUM (PORCINE) 1000 UNIT/ML IJ SOLN
INTRAMUSCULAR | Status: DC | PRN
  Administered 2017-08-02: 9000 [IU] via INTRAVENOUS
  Administered 2017-08-02: 2000 [IU] via INTRAVENOUS

## 2017-08-02 MED ORDER — CHLORHEXIDINE GLUCONATE 4 % EX LIQD: 60.0000 mL | Freq: Once | CUTANEOUS | Status: DC

## 2017-08-02 MED ORDER — ALPRAZOLAM 0.25 MG PO TABS
0.2500 mg | ORAL_TABLET | Freq: Three times a day (TID) | ORAL | Status: DC | PRN
  Administered 2017-08-02 – 2017-08-04 (×4): 0.25 mg via ORAL
  Filled 2017-08-02 (×4): qty 1

## 2017-08-02 MED ORDER — MORPHINE SULFATE (PF) 2 MG/ML IV SOLN: 2.0000 mg | INTRAVENOUS | Status: DC | PRN

## 2017-08-02 MED ORDER — PROTAMINE SULFATE 10 MG/ML IV SOLN
INTRAVENOUS | Status: DC | PRN
  Administered 2017-08-02 (×2): 30 mg via INTRAVENOUS
  Administered 2017-08-02: 10 mg via INTRAVENOUS
  Administered 2017-08-02: 30 mg via INTRAVENOUS
  Administered 2017-08-02: 20 mg via INTRAVENOUS

## 2017-08-02 MED ORDER — CHLORHEXIDINE GLUCONATE 0.12 % MT SOLN
15.0000 mL | Freq: Once | OROMUCOSAL | Status: AC
  Administered 2017-08-02: 15 mL via OROMUCOSAL

## 2017-08-02 MED ORDER — ACETAMINOPHEN 500 MG PO TABS
1000.0000 mg | ORAL_TABLET | Freq: Four times a day (QID) | ORAL | Status: DC
  Administered 2017-08-03 – 2017-08-04 (×2): 1000 mg via ORAL
  Filled 2017-08-02 (×2): qty 2

## 2017-08-02 MED ORDER — MIDAZOLAM HCL 2 MG/2ML IJ SOLN
0.5000 mg | Freq: Once | INTRAMUSCULAR | Status: AC
  Administered 2017-08-02: 0.5 mg via INTRAVENOUS

## 2017-08-02 MED ORDER — LIDOCAINE HCL (PF) 1 % IJ SOLN
INTRAMUSCULAR | Status: AC
  Filled 2017-08-02: qty 30

## 2017-08-02 MED ORDER — TRAMADOL HCL 50 MG PO TABS
50.0000 mg | ORAL_TABLET | ORAL | Status: DC | PRN
  Administered 2017-08-02 – 2017-08-03 (×2): 50 mg via ORAL
  Filled 2017-08-02 (×2): qty 1

## 2017-08-02 MED ORDER — SODIUM CHLORIDE 0.9 % IV SOLN
INTRAVENOUS | Status: AC
  Filled 2017-08-02 (×3): qty 1.2

## 2017-08-02 MED ORDER — OXYCODONE HCL 5 MG PO TABS
5.0000 mg | ORAL_TABLET | ORAL | Status: DC | PRN
  Administered 2017-08-03 (×3): 5 mg via ORAL
  Filled 2017-08-02 (×3): qty 1

## 2017-08-02 MED ORDER — FENTANYL CITRATE (PF) 250 MCG/5ML IJ SOLN
INTRAMUSCULAR | Status: AC
  Filled 2017-08-02: qty 5

## 2017-08-02 MED ORDER — FENTANYL CITRATE (PF) 100 MCG/2ML IJ SOLN
INTRAMUSCULAR | Status: AC
  Administered 2017-08-02: 50 ug via INTRAVENOUS
  Filled 2017-08-02: qty 2

## 2017-08-02 MED ORDER — PANTOPRAZOLE SODIUM 40 MG PO TBEC
40.0000 mg | DELAYED_RELEASE_TABLET | Freq: Every day | ORAL | Status: DC
  Administered 2017-08-03 – 2017-08-04 (×2): 40 mg via ORAL
  Filled 2017-08-02 (×2): qty 1

## 2017-08-02 MED ORDER — PROPOFOL 500 MG/50ML IV EMUL
INTRAVENOUS | Status: DC | PRN
  Administered 2017-08-02: 10 ug/kg/min via INTRAVENOUS

## 2017-08-02 MED ORDER — SODIUM CHLORIDE 0.9 % IV SOLN
0.0000 ug/min | INTRAVENOUS | Status: DC
  Filled 2017-08-02: qty 2

## 2017-08-02 MED ORDER — ASPIRIN EC 81 MG PO TBEC
81.0000 mg | DELAYED_RELEASE_TABLET | Freq: Every day | ORAL | Status: DC
  Administered 2017-08-03 – 2017-08-04 (×2): 81 mg via ORAL
  Filled 2017-08-02 (×2): qty 1

## 2017-08-02 MED ORDER — DEXAMETHASONE SODIUM PHOSPHATE 10 MG/ML IJ SOLN
INTRAMUSCULAR | Status: DC | PRN
  Administered 2017-08-02: 5 mg via INTRAVENOUS

## 2017-08-02 MED ORDER — NITROGLYCERIN IN D5W 200-5 MCG/ML-% IV SOLN: 0.0000 ug/min | INTRAVENOUS | Status: DC

## 2017-08-02 MED ORDER — CITALOPRAM HYDROBROMIDE 20 MG PO TABS
10.0000 mg | ORAL_TABLET | Freq: Every day | ORAL | Status: DC
  Administered 2017-08-02 – 2017-08-03 (×2): 10 mg via ORAL
  Filled 2017-08-02 (×3): qty 1

## 2017-08-02 MED ORDER — SODIUM CHLORIDE 0.9 % IV SOLN
INTRAVENOUS | Status: DC
  Administered 2017-08-02: 11:00:00 via INTRAVENOUS

## 2017-08-02 MED ORDER — SODIUM CHLORIDE 0.9 % IV SOLN
1.5000 g | Freq: Two times a day (BID) | INTRAVENOUS | Status: AC
  Administered 2017-08-02 – 2017-08-04 (×4): 1.5 g via INTRAVENOUS
  Filled 2017-08-02 (×4): qty 1.5

## 2017-08-02 MED ORDER — LEVOTHYROXINE SODIUM 50 MCG PO TABS
50.0000 ug | ORAL_TABLET | Freq: Every day | ORAL | Status: DC
  Administered 2017-08-03 – 2017-08-04 (×2): 50 ug via ORAL
  Filled 2017-08-02 (×2): qty 1

## 2017-08-02 MED ORDER — ONDANSETRON HCL 4 MG/2ML IJ SOLN: 4.0000 mg | Freq: Four times a day (QID) | INTRAMUSCULAR | Status: DC | PRN

## 2017-08-02 MED ORDER — HYDRALAZINE HCL 20 MG/ML IJ SOLN
INTRAMUSCULAR | Status: DC | PRN
  Administered 2017-08-02 (×2): 5 mg via INTRAVENOUS

## 2017-08-02 MED ORDER — SODIUM CHLORIDE 0.9 % IV SOLN
INTRAVENOUS | Status: DC
  Administered 2017-08-02: 16:00:00 via INTRAVENOUS

## 2017-08-02 MED ORDER — SODIUM CHLORIDE 0.9 % IV SOLN
INTRAVENOUS | Status: DC | PRN
  Administered 2017-08-02: 1500 mL

## 2017-08-02 MED ORDER — VANCOMYCIN HCL IN DEXTROSE 1-5 GM/200ML-% IV SOLN
1000.0000 mg | Freq: Once | INTRAVENOUS | Status: AC
  Administered 2017-08-03: 1000 mg via INTRAVENOUS
  Filled 2017-08-02: qty 200

## 2017-08-02 MED ORDER — ALBUMIN HUMAN 5 % IV SOLN: 250.0000 mL | INTRAVENOUS | Status: AC | PRN

## 2017-08-02 MED ORDER — LIDOCAINE HCL 1 % IJ SOLN
INTRAMUSCULAR | Status: DC | PRN
  Administered 2017-08-02: 15 mL

## 2017-08-02 MED ORDER — IRBESARTAN 300 MG PO TABS
300.0000 mg | ORAL_TABLET | Freq: Every day | ORAL | Status: DC
  Administered 2017-08-02 – 2017-08-03 (×2): 300 mg via ORAL
  Filled 2017-08-02 (×2): qty 1

## 2017-08-02 MED ORDER — MIDAZOLAM HCL 2 MG/2ML IJ SOLN
INTRAMUSCULAR | Status: AC
  Administered 2017-08-02: 0.5 mg via INTRAVENOUS
  Filled 2017-08-02: qty 2

## 2017-08-02 MED ORDER — CHLORHEXIDINE GLUCONATE 0.12 % MT SOLN
OROMUCOSAL | Status: AC
  Administered 2017-08-02: 15 mL via OROMUCOSAL
  Filled 2017-08-02: qty 15

## 2017-08-02 MED ORDER — IODIXANOL 320 MG/ML IV SOLN
INTRAVENOUS | Status: DC | PRN
  Administered 2017-08-02: 28 mL via INTRAVENOUS

## 2017-08-02 MED ORDER — METOPROLOL TARTRATE 5 MG/5ML IV SOLN
2.5000 mg | INTRAVENOUS | Status: DC | PRN
  Administered 2017-08-02: 2.5 mg via INTRAVENOUS
  Filled 2017-08-02 (×2): qty 5

## 2017-08-02 MED ORDER — DIPHENHYDRAMINE HCL 50 MG/ML IJ SOLN
INTRAMUSCULAR | Status: DC | PRN
  Administered 2017-08-02: 12.5 mg via INTRAVENOUS

## 2017-08-02 MED ORDER — HYDROCHLOROTHIAZIDE 12.5 MG PO CAPS
12.5000 mg | ORAL_CAPSULE | Freq: Every day | ORAL | Status: DC
  Administered 2017-08-02 – 2017-08-03 (×2): 12.5 mg via ORAL
  Filled 2017-08-02 (×2): qty 1

## 2017-08-02 MED ORDER — FENTANYL CITRATE (PF) 100 MCG/2ML IJ SOLN
50.0000 ug | Freq: Once | INTRAMUSCULAR | Status: AC
  Administered 2017-08-02: 50 ug via INTRAVENOUS

## 2017-08-02 MED ORDER — FENTANYL CITRATE (PF) 250 MCG/5ML IJ SOLN
INTRAMUSCULAR | Status: DC | PRN
  Administered 2017-08-02: 50 ug via INTRAVENOUS

## 2017-08-02 MED ORDER — LACTATED RINGERS IV SOLN: 500.0000 mL | Freq: Once | INTRAVENOUS | Status: DC | PRN

## 2017-08-02 MED ORDER — CHLORHEXIDINE GLUCONATE 4 % EX LIQD: 30.0000 mL | CUTANEOUS | Status: DC

## 2017-08-02 MED ORDER — ZOLPIDEM TARTRATE 5 MG PO TABS: 5.0000 mg | ORAL_TABLET | Freq: Every evening | ORAL | Status: DC | PRN

## 2017-08-02 MED ORDER — CLOPIDOGREL BISULFATE 75 MG PO TABS
75.0000 mg | ORAL_TABLET | Freq: Every day | ORAL | Status: DC
  Administered 2017-08-03 – 2017-08-04 (×2): 75 mg via ORAL
  Filled 2017-08-02 (×2): qty 1

## 2017-08-02 MED ORDER — LACTATED RINGERS IV SOLN
INTRAVENOUS | Status: DC | PRN
  Administered 2017-08-02 (×2): via INTRAVENOUS

## 2017-08-02 MED ORDER — ASPIRIN 81 MG PO CHEW: 81.0000 mg | CHEWABLE_TABLET | Freq: Every day | ORAL | Status: DC

## 2017-08-02 MED ORDER — ACETAMINOPHEN 160 MG/5ML PO SOLN: 1000.0000 mg | Freq: Four times a day (QID) | ORAL | Status: DC

## 2017-08-02 MED ORDER — BUPROPION HCL ER (SR) 150 MG PO TB12
150.0000 mg | ORAL_TABLET | Freq: Every day | ORAL | Status: DC
  Administered 2017-08-03 – 2017-08-04 (×2): 150 mg via ORAL
  Filled 2017-08-02 (×2): qty 1

## 2017-08-02 MED ORDER — OLMESARTAN MEDOXOMIL-HCTZ 40-12.5 MG PO TABS: 1.0000 | ORAL_TABLET | Freq: Every day | ORAL | Status: DC

## 2017-08-02 SURGICAL SUPPLY — 76 items
BAG DECANTER FOR FLEXI CONT (MISCELLANEOUS) IMPLANT
BAG SNAP BAND KOVER 36X36 (MISCELLANEOUS) ×4 IMPLANT
BLADE CLIPPER SURG (BLADE) IMPLANT
BLADE OSCILLATING /SAGITTAL (BLADE) IMPLANT
BLADE STERNUM SYSTEM 6 (BLADE) IMPLANT
CABLE ADAPT CONN TEMP 6FT (ADAPTER) ×4 IMPLANT
CANNULA FEM VENOUS REMOTE 22FR (CANNULA) IMPLANT
CANNULA OPTISITE PERFUSION 16F (CANNULA) IMPLANT
CANNULA OPTISITE PERFUSION 18F (CANNULA) IMPLANT
CATH DIAG EXPO 6F VENT PIG 145 (CATHETERS) ×16 IMPLANT
CATH EXPO 5FR AL1 (CATHETERS) ×4 IMPLANT
CATH S G BIP PACING (SET/KITS/TRAYS/PACK) ×4 IMPLANT
CATH TEMPO TEMP PACE LEAD CRVE (CATHETERS) ×4 IMPLANT
CLIP VESOCCLUDE MED 24/CT (CLIP) IMPLANT
CONT SPEC 4OZ CLIKSEAL STRL BL (MISCELLANEOUS) ×12 IMPLANT
COVER BACK TABLE 24X17X13 BIG (DRAPES) IMPLANT
COVER BACK TABLE 80X110 HD (DRAPES) ×8 IMPLANT
COVER DOME SNAP 22 D (MISCELLANEOUS) ×4 IMPLANT
CRADLE DONUT ADULT HEAD (MISCELLANEOUS) ×4 IMPLANT
DERMABOND ADHESIVE PROPEN (GAUZE/BANDAGES/DRESSINGS) ×2
DERMABOND ADVANCED (GAUZE/BANDAGES/DRESSINGS) ×2
DERMABOND ADVANCED .7 DNX12 (GAUZE/BANDAGES/DRESSINGS) ×2 IMPLANT
DERMABOND ADVANCED .7 DNX6 (GAUZE/BANDAGES/DRESSINGS) ×2 IMPLANT
DEVICE CLOSURE PERCLS PRGLD 6F (VASCULAR PRODUCTS) ×4 IMPLANT
DRAPE INCISE IOBAN 66X45 STRL (DRAPES) IMPLANT
DRSG TEGADERM 4X4.75 (GAUZE/BANDAGES/DRESSINGS) ×8 IMPLANT
ELECT CAUTERY BLADE 6.4 (BLADE) IMPLANT
ELECT REM PT RETURN 9FT ADLT (ELECTROSURGICAL) ×8
ELECTRODE REM PT RTRN 9FT ADLT (ELECTROSURGICAL) ×4 IMPLANT
FELT TEFLON 6X6 (MISCELLANEOUS) IMPLANT
FEMORAL VENOUS CANN RAP (CANNULA) IMPLANT
GAUZE SPONGE 4X4 12PLY STRL (GAUZE/BANDAGES/DRESSINGS) ×4 IMPLANT
GAUZE SPONGE 4X4 12PLY STRL LF (GAUZE/BANDAGES/DRESSINGS) ×4 IMPLANT
GLOVE BIO SURGEON STRL SZ7.5 (GLOVE) ×4 IMPLANT
GLOVE BIO SURGEON STRL SZ8 (GLOVE) IMPLANT
GLOVE EUDERMIC 7 POWDERFREE (GLOVE) IMPLANT
GLOVE ORTHO TXT STRL SZ7.5 (GLOVE) IMPLANT
GOWN STRL REUS W/ TWL LRG LVL3 (GOWN DISPOSABLE) IMPLANT
GOWN STRL REUS W/ TWL XL LVL3 (GOWN DISPOSABLE) ×2 IMPLANT
GOWN STRL REUS W/TWL LRG LVL3 (GOWN DISPOSABLE)
GOWN STRL REUS W/TWL XL LVL3 (GOWN DISPOSABLE) ×2
GUIDEWIRE SAFE TJ AMPLATZ EXST (WIRE) ×8 IMPLANT
GUIDEWIRE STRAIGHT .035 260CM (WIRE) ×4 IMPLANT
INSERT FOGARTY SM (MISCELLANEOUS) IMPLANT
KIT BASIN OR (CUSTOM PROCEDURE TRAY) ×4 IMPLANT
KIT DILATOR VASC 18G NDL (KITS) IMPLANT
KIT HEART LEFT (KITS) ×4 IMPLANT
KIT SUCTION CATH 14FR (SUCTIONS) IMPLANT
KIT TURNOVER KIT B (KITS) ×4 IMPLANT
NEEDLE 22X1 1/2 (OR ONLY) (NEEDLE) IMPLANT
NEEDLE PERC 18GX7CM (NEEDLE) ×4 IMPLANT
NS IRRIG 1000ML POUR BTL (IV SOLUTION) ×12 IMPLANT
PACK ENDOVASCULAR (PACKS) ×4 IMPLANT
PAD ARMBOARD 7.5X6 YLW CONV (MISCELLANEOUS) ×8 IMPLANT
PAD ELECT DEFIB RADIOL ZOLL (MISCELLANEOUS) ×4 IMPLANT
PENCIL BUTTON HOLSTER BLD 10FT (ELECTRODE) ×4 IMPLANT
PERCLOSE PROGLIDE 6F (VASCULAR PRODUCTS) ×8
SET MICROPUNCTURE 5F STIFF (MISCELLANEOUS) ×4 IMPLANT
SHEATH BRITE TIP 6FR 35CM (SHEATH) ×4 IMPLANT
SHEATH PINNACLE 6F 10CM (SHEATH) ×4 IMPLANT
SHEATH PINNACLE 8F 10CM (SHEATH) ×4 IMPLANT
SLEEVE REPOSITIONING LENGTH 30 (MISCELLANEOUS) ×4 IMPLANT
STOPCOCK MORSE 400PSI 3WAY (MISCELLANEOUS) ×12 IMPLANT
SUT SILK  1 MH (SUTURE) ×2
SUT SILK 1 MH (SUTURE) ×2 IMPLANT
SYR 50ML LL SCALE MARK (SYRINGE) ×4 IMPLANT
SYR BULB IRRIGATION 50ML (SYRINGE) IMPLANT
SYR CONTROL 10ML LL (SYRINGE) IMPLANT
TAPE CLOTH SURG 6X10 WHT LF (GAUZE/BANDAGES/DRESSINGS) ×4 IMPLANT
TOWEL GREEN STERILE (TOWEL DISPOSABLE) ×8 IMPLANT
TRANSDUCER W/STOPCOCK (MISCELLANEOUS) ×8 IMPLANT
VALVE HEART TRANSCATH SZ3 26MM (Prosthesis & Implant Heart) ×4 IMPLANT
WIRE .035 3MM-J 145CM (WIRE) ×4 IMPLANT
WIRE AMPLATZ SS-J .035X180CM (WIRE) ×4 IMPLANT
WIRE EMERALD 3MM-J .035X150CM (WIRE) ×4 IMPLANT
WIRE EMERALD ST .035X260CM (WIRE) ×4 IMPLANT

## 2017-08-02 NOTE — Progress Notes (Signed)
  Echocardiogram 2D Echocardiogram has been performed.  [name] 08/02/2017, 2:58 PM

## 2017-08-02 NOTE — Anesthesia Procedure Notes (Signed)
Arterial Line Insertion Start/End5/21/2019 11:30 AM, 08/02/2017 11:50 AM Performed by: Myna Bright, CRNA, CRNA  Patient location: Pre-op. Preanesthetic checklist: patient identified, IV checked, site marked, risks and benefits discussed, surgical consent, monitors and equipment checked and pre-op evaluation Lidocaine 1% used for infiltration Right, radial was placed Catheter size: 20 G Hand hygiene performed  and maximum sterile barriers used  Allen's test indicative of satisfactory collateral circulation Attempts: 2 Procedure performed without using ultrasound guided technique. Following insertion, Biopatch. Post procedure assessment: normal  Patient tolerated the procedure well with no immediate complications.

## 2017-08-02 NOTE — Anesthesia Procedure Notes (Addendum)
Central Venous Catheter Insertion Performed by: Belinda Block, MD, anesthesiologist Start/End5/21/2019 10:10 AM, 08/02/2017 12:25 PM Preanesthetic checklist: patient identified, IV checked, site marked, risks and benefits discussed, surgical consent, monitors and equipment checked, pre-op evaluation and timeout performed Position: Trendelenburg Lidocaine 1% used for infiltration and patient sedated Hand hygiene performed , maximum sterile barriers used  and Seldinger technique used Central line was placed.Double lumen Procedure performed using ultrasound guided technique. Ultrasound Notes:anatomy identified and image(s) printed for medical record Attempts: 1 Following insertion, line sutured. Post procedure assessment: blood return through all ports  Patient tolerated the procedure well with no immediate complications.

## 2017-08-02 NOTE — Interval H&P Note (Signed)
History and Physical Interval Note:  08/02/2017 12:31 PM  [name]  has presented today for surgery, with the diagnosis of Severe Aortic Stenosis  The various methods of treatment have been discussed with the patient and family. After consideration of risks, benefits and other options for treatment, the patient has consented to  Procedure(s): TRANSCATHETER AORTIC VALVE REPLACEMENT, TRANSFEMORAL (N/A) TRANSESOPHAGEAL ECHOCARDIOGRAM (TEE) (N/A) as a surgical intervention .  The patient's history has been reviewed, patient examined, no change in status, stable for surgery.  I have reviewed the patient's chart and labs.  Questions were answered to the patient's satisfaction.     Gaye Pollack

## 2017-08-02 NOTE — Transfer of Care (Signed)
Immediate Anesthesia Transfer of Care Note  Patient: [name]  Procedure(s) Performed: TRANSCATHETER AORTIC VALVE REPLACEMENT, TRANSFEMORAL (N/A Chest) INTRAOPERATIVE TRANSTHORACIC ECHOCARDIOGRAM (N/A )  Patient Location: ICU  Anesthesia Type:MAC  Level of Consciousness: awake, alert  and oriented  Airway & Oxygen Therapy: Patient Spontanous Breathing and Patient connected to nasal cannula oxygen  Post-op Assessment: Report given to RN, Post -op Vital signs reviewed and stable and Patient moving all extremities X 4  Post vital signs: Reviewed and stable  Last Vitals:  Vitals Value Taken Time  BP    Temp 36.6 C 08/02/2017  3:12 PM  Pulse 64 08/02/2017  3:19 PM  Resp 19 08/02/2017  3:19 PM  SpO2 98 % 08/02/2017  3:19 PM  Vitals shown include unvalidated device data.  Last Pain:  Vitals:   08/02/17 1512  TempSrc: Oral  PainSc:          Complications: No apparent anesthesia complications

## 2017-08-02 NOTE — Anesthesia Procedure Notes (Signed)
Procedure Name: MAC Date/Time: 08/02/2017 12:55 PM Performed by: Mariea Clonts, CRNA Pre-anesthesia Checklist: Patient identified, Emergency Drugs available, Suction available, Patient being monitored and Timeout performed Patient Re-evaluated:Patient Re-evaluated prior to induction Oxygen Delivery Method: Simple face mask

## 2017-08-02 NOTE — CV Procedure (Signed)
[facility]  TAVR OPERATIVE NOTE   Date of Procedure:  08/02/2017  Preoperative Diagnosis: Severe Aortic Stenosis   Postoperative Diagnosis: Same   Procedure:    Transcatheter Aortic Valve Replacement - Transfemoral Approach  Edwards Sapien 3 THV (size 26 mm, model # [id number], serial # [id number])   Co-Surgeons:  Lauree Chandler, MD and Gaye Pollack, MD   Anesthesiologist:  Nyoka Cowden  Echocardiographer:  Johnsie Cancel  Pre-operative Echo Findings:  Severe aortic stenosis  Normal left ventricular systolic function  Post-operative Echo Findings:  No paravalvular leak  Normal left ventricular systolic function  BRIEF CLINICAL NOTE AND INDICATIONS FOR SURGERY  84 yo female with history of CAD, chronic kidney disease, HLD, HTN,  hypothyroidism and severe aortic stenosis who is here today for TAVR. She has been followed by Dr. Tamala Julian for moderate aortic stenosis for years. Most recent echo March 2019 with normal LV systolic function. The aortic valve is thickened and calcified with mean gradient of 27 mmHG and peak gradient of 52 mmHg, DVI 0.24. She has moderate CAD by recent cardiac cath. She also has HTN and hyperlipidemia. She has had progressive dyspnea with exertion and fatigue. No chest pain. No LE edema. She is unaware of any fluid retention. No dizziness or syncope.  During the course of the patient's preoperative work up they have been evaluated comprehensively by a multidisciplinary team of specialists coordinated through the [facility] in the [facility].  They have been demonstrated to suffer from symptomatic severe aortic stenosis as noted above. The patient has been counseled extensively as to the relative risks and benefits of all options for the treatment of severe aortic stenosis including long term medical therapy, conventional surgery for aortic valve replacement, and transcatheter aortic valve  replacement.  The patient has been independently evaluated by two cardiac surgeons including Dr Roxy Manns and Dr. Cyndia Bent, and they are felt to be at high risk for conventional surgical aortic valve replacement. Both surgeons indicated the patient would be a poor candidate for conventional surgery. Based upon review of all of the patient's preoperative diagnostic tests they are felt to be candidate for transcatheter aortic valve replacement using the transfemoral approach as an alternative to high risk conventional surgery.    Following the decision to proceed with transcatheter aortic valve replacement, a discussion has been held regarding what types of management strategies would be attempted intraoperatively in the event of life-threatening complications, including whether or not the patient would be considered a candidate for the use of cardiopulmonary bypass and/or conversion to open sternotomy for attempted surgical intervention.  The patient has been advised of a variety of complications that might develop peculiar to this approach including but not limited to risks of death, stroke, paravalvular leak, aortic dissection or other major vascular complications, aortic annulus rupture, device embolization, cardiac rupture or perforation, acute myocardial infarction, arrhythmia, heart block or bradycardia requiring permanent pacemaker placement, congestive heart failure, respiratory failure, renal failure, pneumonia, infection, other late complications related to structural valve deterioration or migration, or other complications that might ultimately cause a temporary or permanent loss of functional independence or other long term morbidity.  The patient provides full informed consent for the procedure as described and all questions were answered preoperatively.    DETAILS OF THE OPERATIVE PROCEDURE  PREPARATION:   The patient is brought to the operating room on the above mentioned date and central monitoring  was established by the anesthesia team including placement  of a radial arterial line. The patient is placed in the supine position on the operating table.  Intravenous antibiotics are administered. Conscious sedation is used. A Foley catheter is placed.  Baseline transthoracic echocardiogram was performed. The patient's chest, abdomen, both groins, and both lower extremities are prepared and draped in a sterile manner. A time out procedure is performed.   PERIPHERAL ACCESS:   Using the modified Seldinger technique, femoral arterial and venous access were obtained with placement of 6 Fr sheaths on the left side using u/s guidance.  A pigtail diagnostic catheter was passed through the femoral arterial sheath under fluoroscopic guidance into the aortic root.  A temporary transvenous pacemaker catheter was passed through the femoral venous sheath under fluoroscopic guidance into the right ventricle.  The pacemaker was tested to ensure stable lead placement and pacemaker capture. Aortic root angiography was performed in order to determine the optimal angiographic angle for valve deployment.  TRANSFEMORAL ACCESS:  A micropuncture kit was used to gain access to the right femoral artery using u/s guidance. Position confirmed with angiography. Pre-closure with double ProGlide closure devices. The patient was heparinized systemically and ACT verified > 250 seconds.    A 14 Fr transfemoral E-sheath was introduced into the right femoral artery after progressively dilating over an Amplatz superstiff wire. An AL-1 catheter was used to direct a straight-tip exchange length wire across the native aortic valve into the left ventricle. This was exchanged out for a pigtail catheter and position was confirmed in the LV apex. Simultaneous LV and Ao pressures were recorded.  The pigtail catheter was then exchanged for an Amplatz Extra-stiff wire in the LV apex.   TRANSCATHETER HEART VALVE DEPLOYMENT:  An Edwards Sapien 3  THV (size 26 mm) was prepared and crimped per manufacturer's guidelines, and the proper orientation of the valve is confirmed on the [employer] delivery system. The valve was advanced through the introducer sheath using normal technique until in an appropriate position in the abdominal aorta beyond the sheath tip. The balloon was then retracted and using the fine-tuning wheel was centered on the valve. The valve was then advanced across the aortic arch using appropriate flexion of the catheter. The valve was carefully positioned across the aortic valve annulus. The Commander catheter was retracted using normal technique. Once final position of the valve has been confirmed by angiographic assessment, the valve is deployed while temporarily holding ventilation and during rapid ventricular pacing to maintain systolic blood pressure < 50 mmHg and pulse pressure < 10 mmHg. The balloon inflation is held for >3 seconds after reaching full deployment volume. Once the balloon has fully deflated the balloon is retracted into the ascending aorta and valve function is assessed using TTE. There is felt to be no paravalvular leak and no central aortic insufficiency.  The patient's hemodynamic recovery following valve deployment is good.  The deployment balloon and guidewire are both removed. Echo demostrated acceptable post-procedural gradients, stable mitral valve function, and no AI.   PROCEDURE COMPLETION:  The sheath was then removed and closure devices were completed. Protamine was administered once femoral arterial repair was complete. The temporary pacemaker, pigtail catheters and femoral sheaths were removed with manual pressure used for hemostasis.   The patient tolerated the procedure well and is transported to the surgical intensive care in stable condition. There were no immediate intraoperative complications. All sponge instrument and needle counts are verified correct at completion of the operation.    No blood products were administered during the  operation.  The patient received a total of 28.8 mL of intravenous contrast during the procedure.  Lauree Chandler MD 08/02/2017 11:46 AM

## 2017-08-02 NOTE — Anesthesia Preprocedure Evaluation (Signed)
Anesthesia Evaluation  Patient identified by MRN, date of birth, ID band Patient awake    Reviewed: Allergy & Precautions, NPO status   Airway Mallampati: II  TM Distance: >3 FB     Dental   Pulmonary shortness of breath, pneumonia, former smoker,    breath sounds clear to auscultation       Cardiovascular hypertension, + CAD  + Valvular Problems/Murmurs AS  Rhythm:Regular Rate:Normal + Systolic murmurs    Neuro/Psych  Headaches,    GI/Hepatic GERD  ,  Endo/Other  Hypothyroidism   Renal/GU Renal disease     Musculoskeletal  (+) Arthritis ,   Abdominal   Peds  Hematology   Anesthesia Other Findings   Reproductive/Obstetrics                             Anesthesia Physical Anesthesia Plan  ASA: IV  Anesthesia Plan: MAC   Post-op Pain Management:    Induction: Intravenous  PONV Risk Score and Plan: Treatment may vary due to age or medical condition  Airway Management Planned: Nasal Cannula and Simple Face Mask  Additional Equipment:   Intra-op Plan:   Post-operative Plan:   Informed Consent: I have reviewed the patients History and Physical, chart, labs and discussed the procedure including the risks, benefits and alternatives for the proposed anesthesia with the patient or authorized representative who has indicated his/her understanding and acceptance.   Dental advisory given  Plan Discussed with: CRNA and Anesthesiologist  Anesthesia Plan Comments:         Anesthesia Quick Evaluation

## 2017-08-02 NOTE — Anesthesia Postprocedure Evaluation (Signed)
Anesthesia Post Note  Patient: [name]  Procedure(s) Performed: TRANSCATHETER AORTIC VALVE REPLACEMENT, TRANSFEMORAL (N/A Chest) INTRAOPERATIVE TRANSTHORACIC ECHOCARDIOGRAM (N/A )     Patient location during evaluation: ICU Anesthesia Type: MAC Level of consciousness: awake Pain management: pain level controlled Vital Signs Assessment: post-procedure vital signs reviewed and stable Respiratory status: spontaneous breathing Cardiovascular status: stable Anesthetic complications: no    Last Vitals:  Vitals:   08/02/17 1700 08/02/17 1715  BP: 140/65   Pulse: 65 68  Resp: (!) 22 (!) 21  Temp:    SpO2: 100% 99%    Last Pain:  Vitals:   08/02/17 1600  TempSrc:   PainSc: 0-No pain                 Chanceler Pullin

## 2017-08-02 NOTE — Progress Notes (Signed)
  [place] VALVE TEAM  Patient doing well s/p TAVR. She is hemodynamically stable. Groin sites stable. ECG with no high grade block. BP mildly elevated. Will resume home Benicar-HCT. Plan to DC arterial line. Early ambulation and plan to transfer to tele tomorrow.   Angelena Form PA-C  [facility]  Pager [phone]

## 2017-08-02 NOTE — Op Note (Signed)
[facility]   MULTIDISCIPLINARY HEART VALVE TEAM   TAVR OPERATIVE NOTE   Date of Procedure:  08/02/2017  Preoperative Diagnosis: Severe Aortic Stenosis   Postoperative Diagnosis: Same   Procedure:    Transcatheter Aortic Valve Replacement - Percutaneous Right Transfemoral Approach  Edwards Sapien 3 THV (size 26 mm, model # 9600TFX, serial # [id number])   Co-Surgeons:  Gaye Pollack, MD and Lauree Chandler, MD    Anesthesiologist:  Belinda Block, MD  Echocardiographer:  Jenkins Rouge, MD  Pre-operative Echo Findings:  Severe aortic stenosis  Normal left ventricular systolic function  Post-operative Echo Findings:  No paravalvular leak  Normal left ventricular systolic function   BRIEF CLINICAL NOTE AND INDICATIONS FOR SURGERY  This 84 year old woman has stage D, severe, symptomatic aortic stenosis with New York Heart Association class III symptoms of exertional fatigue and shortness of breath with low level activity. Her symptoms have been progressive over the past 6 months and are now markedly limiting her activity. I have personally reviewed her echocardiogram, cardiac cath, and CTA studies. Her echocardiogram shows a trileaflet aortic valve that is severely calcified with markedly restricted leaflet mobility and looks like a severely stenotic aortic valve. Her mean gradient is only 27 mmHg but her dimensionless index is 0.25 and given the appearance of her valve and her symptoms I think she really has low gradient, normal EF, severe aortic stenosis. Her cardiac catheterization shows moderate coronary artery disease with a stent in the ostium of the right coronary artery which is protruding slightly. There is about 70% ostial RCA narrowing. The patient has no anginal symptoms. I think aortic valve replacement is indicated in this patient since she is still living independently and would like to maintain her activity level which has been markedly limited by  exertional fatigue and shortness of breath. I think her operative risk would be at least moderately elevated for open surgical aortic valve replacement +/- coronary bypass graft surgery. I think transcatheter aortic valve replacement would be the best option for this patient. Her gated cardiac CTA shows anatomy that is suitable for transcatheter aortic valve replacement with no complicating features. There is a stent slightly protruding from the RCA ostium but this is 18 mm above the annulus. Her abdominal and pelvic CTA shows adequate pelvic arterial anatomy to allow transfemoral insertion. Pulmonary function testing shows minimal obstructive disease and a minimal decrease in her diffusion capacity. Carotid arterial Dopplers showed no stenosis.   The patient and her daughter were counseled at length regarding treatment alternatives for management of severe symptomatic aortic stenosis. The risks and benefits of surgical intervention has been discussed in detail. Long-term prognosis with medical therapy was discussed. Alternative approaches such as conventional surgical aortic valve replacement, transcatheter aortic valve replacement, and palliative medical therapy were compared and contrasted at length. This discussion was placed in the context of the patient's own specific clinical presentation and past medical history. All of their questions have been addressed.   A discussion was held regarding what types of management strategies would be attempted intraoperatively in the event of life-threatening complications, including whether or not the patient would be considered a candidate for the use of cardiopulmonary bypass and/or conversion to open sternotomy for attempted surgical intervention.   The patient has been advised of a variety of complications that might develop including but not limited to risks of death, stroke, paravalvular leak, aortic dissection or other major vascular complications, aortic  annulus rupture, device embolization, cardiac rupture or  perforation, mitral regurgitation, acute myocardial infarction, arrhythmia, heart block or bradycardia requiring permanent pacemaker placement, congestive heart failure, respiratory failure, renal failure, pneumonia, infection, other late complications related to structural valve deterioration or migration, or other complications that might ultimately cause a temporary or permanent loss of functional independence or other long term morbidity. The patient provides full informed consent for the procedure as described and all questions were answered.    DETAILS OF THE OPERATIVE PROCEDURE  PREPARATION:    The patient is brought to the operating room on the above mentioned date and central monitoring was established by the anesthesia team including placement of a central venous line and radial arterial line. The patient is placed in the supine position on the operating table.  Intravenous antibiotics are administered. The patient is monitored closely throughout the procedure under conscious sedation.    Baseline transthoracic echocardiogram was performed. The patient's chest, abdomen, both groins, and both lower extremities are prepared and draped in a sterile manner. A time out procedure is performed.   PERIPHERAL ACCESS:    Using the modified Seldinger technique, femoral arterial and venous access was obtained with placement of 6 Fr sheaths on the left side.  A pigtail diagnostic catheter was passed through the left arterial sheath under fluoroscopic guidance into the aortic root.  A temporary transvenous pacemaker catheter was passed through the left femoral venous sheath under fluoroscopic guidance into the right ventricle.  The pacemaker was tested to ensure stable lead placement and pacemaker capture. Aortic root angiography was performed in order to determine the optimal angiographic angle for valve deployment.   TRANSFEMORAL ACCESS:    Percutaneous transfemoral access and sheath placement was performed using ultrasound guidance.  The right common femoral artery was cannulated using a micropuncture needle and appropriate location was verified using hand injection angiogram.  A pair of [employer] Perclose percutaneous closure devices were placed and a 6 French sheath replaced into the femoral artery.  The patient was heparinized systemically and ACT verified > 250 seconds.    A 14 Fr transfemoral E-sheath was introduced into the right femoral artery after progressively dilating over an Amplatz superstiff wire. An AL-2 catheter was used to direct a straight-tip exchange length wire across the native aortic valve into the left ventricle. This was exchanged out for a pigtail catheter and position was confirmed in the LV apex. Simultaneous LV and Ao pressures were recorded.  The pigtail catheter was exchanged for an Amplatz Extra-stiff wire in the LV apex.  Echocardiography was utilized to confirm appropriate wire position and no sign of entanglement in the mitral subvalvular apparatus.   BALLOON AORTIC VALVULOPLASTY:   Not performed  TRANSCATHETER HEART VALVE DEPLOYMENT:   An Edwards Sapien 3 transcatheter heart valve (size 26 mm, model #[id number], serial #[id number]) was prepared and crimped per manufacturer's guidelines, and the proper orientation of the valve is confirmed on the [employer] delivery system. The valve was advanced through the introducer sheath using normal technique until in an appropriate position in the abdominal aorta beyond the sheath tip. The balloon was then retracted and using the fine-tuning wheel was centered on the valve. The valve was then advanced across the aortic arch using appropriate flexion of the catheter. The valve was carefully positioned across the aortic valve annulus. The Commander catheter was retracted using normal technique. Once final position of the valve has been confirmed by angiographic  assessment, the valve is deployed while temporarily holding ventilation and during rapid ventricular pacing to maintain systolic  blood pressure < 50 mmHg and pulse pressure < 10 mmHg. The balloon inflation is held for >3 seconds after reaching full deployment volume. Once the balloon has fully deflated the balloon is retracted into the ascending aorta and valve function is assessed using echocardiography. There is felt to be no paravalvular leak and no central aortic insufficiency.  The patient's hemodynamic recovery following valve deployment is good.  The deployment balloon and guidewire are both removed.    PROCEDURE COMPLETION:   The sheath was removed and femoral artery closure performed using the previously place Perclose devices.   Protamine was administered once femoral arterial repair was complete. The temporary pacemaker, pigtail catheters and femoral sheaths were removed with manual pressure used for hemostasis.   The patient tolerated the procedure well and is transported to the surgical intensive care in stable condition. There were no immediate intraoperative complications. All sponge instrument and needle counts are verified correct at completion of the operation.   No blood products were administered during the operation.  The patient received a total of 28.8 mL of intravenous contrast during the procedure.   Gaye Pollack, MD 08/02/2017 2:53 PM

## 2017-08-03 ENCOUNTER — Inpatient Hospital Stay (HOSPITAL_COMMUNITY): Payer: Medicare Other

## 2017-08-03 ENCOUNTER — Encounter (HOSPITAL_COMMUNITY): Payer: Self-pay | Admitting: Physician Assistant

## 2017-08-03 ENCOUNTER — Other Ambulatory Visit: Payer: Self-pay | Admitting: Physician Assistant

## 2017-08-03 DIAGNOSIS — Z853 Personal history of malignant neoplasm of breast: Secondary | ICD-10-CM

## 2017-08-03 DIAGNOSIS — Z952 Presence of prosthetic heart valve: Secondary | ICD-10-CM

## 2017-08-03 DIAGNOSIS — I10 Essential (primary) hypertension: Secondary | ICD-10-CM | POA: Diagnosis present

## 2017-08-03 DIAGNOSIS — I350 Nonrheumatic aortic (valve) stenosis: Secondary | ICD-10-CM

## 2017-08-03 DIAGNOSIS — Z954 Presence of other heart-valve replacement: Secondary | ICD-10-CM

## 2017-08-03 DIAGNOSIS — E785 Hyperlipidemia, unspecified: Secondary | ICD-10-CM | POA: Diagnosis present

## 2017-08-03 DIAGNOSIS — I361 Nonrheumatic tricuspid (valve) insufficiency: Secondary | ICD-10-CM

## 2017-08-03 LAB — MAGNESIUM: MAGNESIUM: 1.5 mg/dL — AB (ref 1.7–2.4)

## 2017-08-03 LAB — BASIC METABOLIC PANEL
Anion gap: 9 (ref 5–15)
BUN: 13 mg/dL (ref 6–20)
CALCIUM: 8.7 mg/dL — AB (ref 8.9–10.3)
CHLORIDE: 100 mmol/L — AB (ref 101–111)
CO2: 24 mmol/L (ref 22–32)
CREATININE: 0.96 mg/dL (ref 0.44–1.00)
GFR calc Af Amer: 59 mL/min — ABNORMAL LOW (ref >60)
GFR calc non Af Amer: 51 mL/min — ABNORMAL LOW (ref >60)
Glucose, Bld: 166 mg/dL — ABNORMAL HIGH (ref 65–99)
Potassium: 3.8 mmol/L (ref 3.5–5.1)
Sodium: 133 mmol/L — ABNORMAL LOW (ref 135–145)

## 2017-08-03 LAB — CBC
HEMATOCRIT: 29.3 % — AB (ref 36.0–46.0)
HEMOGLOBIN: 9.5 g/dL — AB (ref 12.0–15.0)
MCH: 29.3 pg (ref 26.0–34.0)
MCHC: 32.4 g/dL (ref 30.0–36.0)
MCV: 90.4 fL (ref 78.0–100.0)
Platelets: 214 10*3/uL (ref 150–400)
RBC: 3.24 MIL/uL — ABNORMAL LOW (ref 3.87–5.11)
RDW: 13.2 % (ref 11.5–15.5)
WBC: 9.5 10*3/uL (ref 4.0–10.5)

## 2017-08-03 LAB — ECHOCARDIOGRAM LIMITED
Height: 59 in
Weight: 3047.64 oz

## 2017-08-03 MED ORDER — METOPROLOL SUCCINATE ER 25 MG PO TB24
25.0000 mg | ORAL_TABLET | Freq: Every day | ORAL | Status: DC
  Administered 2017-08-03 – 2017-08-04 (×2): 25 mg via ORAL
  Filled 2017-08-03 (×2): qty 1

## 2017-08-03 MED FILL — Sodium Chloride IV Soln 0.9%: INTRAVENOUS | Qty: 250 | Status: AC

## 2017-08-03 MED FILL — Magnesium Sulfate Inj 50%: INTRAMUSCULAR | Qty: 10 | Status: AC

## 2017-08-03 MED FILL — Potassium Chloride Inj 2 mEq/ML: INTRAVENOUS | Qty: 40 | Status: AC

## 2017-08-03 MED FILL — Phenylephrine HCl IV Soln 10 MG/ML: INTRAVENOUS | Qty: 2 | Status: AC

## 2017-08-03 MED FILL — Heparin Sodium (Porcine) Inj 1000 Unit/ML: INTRAMUSCULAR | Qty: 30 | Status: AC

## 2017-08-03 NOTE — Progress Notes (Signed)
CARDIAC REHAB PHASE I   PRE:  Rate/Rhythm: 98 SR with PVC    BP: sitting 116/49    SaO2: 98 RA  MODE:  Ambulation: 190 ft   POST:  Rate/Rhythm: 113 ST with PVCs    BP: sitting 139/56     SaO2: 96 RA  Pt able to stand and clean herself from [facility] Walked with RW (she usually uses tripod walker with wheels at home). Slow pace, SOB at times, rest x2. Monitor showing artifact entire walk, when we rested HR would be 110s with PVCs. To recliner, daughter present. Encouraged walking at home, restrictions, and CRPII. Pt not interested in CRPII at this time but will work on increasing her activity/ambulation.  [place], ACSM 08/03/2017 2:13 PM

## 2017-08-03 NOTE — Progress Notes (Signed)
Heather, RN is aware of the central line removal order and will remove central line.  Carolee Rota, RN VAST

## 2017-08-03 NOTE — Progress Notes (Signed)
*  PRELIMINARY RESULTS* Echocardiogram Limited 2-D Echocardiogram has been performed.  [name] 08/03/2017, 12:34 PM

## 2017-08-03 NOTE — Progress Notes (Addendum)
[facility]  Patient Name: [name] Date of Encounter: 08/03/2017  Primary Cardiologist: Dr. Tamala Julian / Dr. Angelena Form & Dr. Cyndia Bent (TAVR)  Hospital Problem List     Principal Problem:   S/P TAVR (transcatheter aortic valve replacement) Active Problems:   Chronic cough   Severe aortic stenosis   Coronary artery disease involving native coronary artery of native heart without angina pectoris   Essential hypertension, benign   Gastroesophageal reflux disease without esophagitis   Hyperlipemia   Hypertension   History of breast cancer    Subjective   Feeling great. No CP or SOB.   Inpatient Medications    Scheduled Meds: . acetaminophen  1,000 mg Oral Q6H   Or  . acetaminophen (TYLENOL) oral liquid 160 mg/5 mL  1,000 mg Per Tube Q6H  . aspirin EC  81 mg Oral Daily   Or  . aspirin  81 mg Per Tube Daily  . buPROPion  150 mg Oral QAC breakfast  . citalopram  10 mg Oral QHS  . clopidogrel  75 mg Oral Q breakfast  . irbesartan  300 mg Oral Daily   And  . hydrochlorothiazide  12.5 mg Oral Daily  . levothyroxine  50 mcg Oral QAC breakfast  . pantoprazole  40 mg Oral Daily   Continuous Infusions: . sodium chloride Stopped (08/02/17 2344)  . albumin human    . cefUROXime (ZINACEF)  IV Stopped (08/02/17 2342)  . lactated ringers    . nitroGLYCERIN    . phenylephrine (NEO-SYNEPHRINE) Adult infusion     PRN Meds: albumin human, ALPRAZolam, lactated ringers, metoprolol tartrate, morphine injection, ondansetron (ZOFRAN) IV, oxyCODONE, traMADol, zolpidem   Vital Signs    Vitals:   08/03/17 0421 08/03/17 0500 08/03/17 0600 08/03/17 0700  BP:  (!) 133/55 137/62 (!) 144/62  Pulse:  73 64 81  Resp:  (!) 22 19 (!) 25  Temp:      TempSrc:      SpO2:  95% 99% 97%  Weight: 190 lb 7.6 oz (86.4 kg)     Height:        Intake/Output Summary (Last 24 hours) at 08/03/2017 0803 Last data filed at 08/03/2017 0115 Gross per 24  hour  Intake 2779.17 ml  Output 120 ml  Net 2659.17 ml   Filed Weights   08/02/17 1032 08/03/17 0421  Weight: 186 lb (84.4 kg) 190 lb 7.6 oz (86.4 kg)    Physical Exam   GEN: Well nourished, well developed, in no acute distress. Appears younger than her stated age.  HEENT: Grossly normal.  Neck: Supple, no JVD, carotid bruits, or masses. Cardiac: RRR, no rubs, or gallops. No murmur. No clubbing, cyanosis, edema.  Radials/DP/PT 2+ and equal bilaterally.  Respiratory:  Respirations regular and unlabored, clear to auscultation bilaterally. GI: Soft, nontender, nondistended, BS + x 4. MS: no deformity or atrophy. Skin: warm and dry, no rash. Groin sites stable Neuro:  Strength and sensation are intact. Psych: AAOx3.  Normal affect.  Labs    CBC Recent Labs    08/02/17 1517 08/02/17 1525 08/03/17 0249  WBC 7.9  --  9.5  HGB 9.5* 9.2* 9.5*  HCT 29.2* 27.0* 29.3*  MCV 89.6  --  90.4  PLT 198  --  542   Basic Metabolic Panel Recent Labs    08/02/17 1355 08/02/17 1525 08/03/17 0249  NA 138 139 133*  K 3.7 3.9 3.8  CL 104  --  100*  CO2  --   --  24  GLUCOSE 101* 113* 166*  BUN 14  --  13  CREATININE 0.80  --  0.96  CALCIUM  --   --  8.7*  MG  --   --  1.5*    Telemetry    NSR  - Personally Reviewed  ECG    NSR, IRBBB - Personally Reviewed  Radiology    Dg Chest Port 1 View  Result Date: 08/02/2017 CLINICAL DATA:  Postop TAVR EXAM: PORTABLE CHEST 1 VIEW COMPARISON:  07/29/2017 FINDINGS: TAVR in good position. Cardiac enlargement without heart failure or edema Mild bibasilar atelectasis.  No effusion Right jugular central venous catheter tip in the lower SVC. No pneumothorax IMPRESSION: Postop TAVR in good position.  Negative for heart failure Central line in the lower SVC without pneumothorax Aortic Atherosclerosis (ICD10-I70.0). Electronically Signed   By: Franchot Gallo M.D.   On: 08/02/2017 15:37    Cardiac Studies   TAVR OPERATIVE NOTE   Date of  Procedure:                08/02/2017  Preoperative Diagnosis:      Severe Aortic Stenosis   Postoperative Diagnosis:    Same   Procedure:        Transcatheter Aortic Valve Replacement - Transfemoral Approach             Edwards Sapien 3 THV (size 26 mm, model # [id number], serial # [id number])              Co-Surgeons:                        Lauree Chandler, MD and Gaye Pollack, MD   Pre-operative Echo Findings: ? Severe aortic stenosis ? Normal left ventricular systolic function  Post-operative Echo Findings: ? No paravalvular leak ? Normal left ventricular systolic function  ______________   Post operative echo 08/03/17: pending.   Patient Profile     [name] is a 84 y.o. female with a history of CAD s/p PCI to RCA in 2010, CKD, HLD, HTN, hypothyroidism, breast cancer s/p lumpectomy and radiation, chronic diastolic CHF and severe AS who presented to [facility] on 08/02/17 for planned TAVR.  Assessment & Plan    Severe Aortic Stenosis: s/p successful TAVR with 40mm Edwards Sapien THV via the TF approach on 08/02/17. Post operative echo today pending. Groin sites stable. Continue ASA and Plavix. Will remove central line and transfer to the floor. Hopeful discharge home tomorrow.   Acute on chronic diastolic CHF: she had a mildly elevated BNP on pre admission labs. This has been treated with TAVR. Her home ARB-HCTZ combo has been resumed.   HTN: BP well controlled today. Home Benicar HCT resumed. HR has been stable, will resume home Toprol XL 25mg  daily.   Arthritis: we have asked her to stop diclofenac since she is on ASA and plavix. She has topical medications she can use  CAD: continued medical therapy. She would benefit from a statin   Signed, Angelena Form, PA-C  08/03/2017, 8:03 AM  Pager [phone]   Chart reviewed, patient examined, agree with above. She feels well and ambulating in room. Groin sites look good. Plan echo today. Transfer to floor.  Ambulate and plan home tomorrow if no changes.

## 2017-08-03 NOTE — Discharge Instructions (Signed)

## 2017-08-04 ENCOUNTER — Other Ambulatory Visit: Payer: Self-pay

## 2017-08-04 DIAGNOSIS — I5032 Chronic diastolic (congestive) heart failure: Secondary | ICD-10-CM

## 2017-08-04 DIAGNOSIS — I5033 Acute on chronic diastolic (congestive) heart failure: Secondary | ICD-10-CM

## 2017-08-04 HISTORY — DX: Chronic diastolic (congestive) heart failure: I50.32

## 2017-08-04 MED ORDER — ASPIRIN 81 MG PO TBEC: 81.0000 mg | DELAYED_RELEASE_TABLET | Freq: Every day | ORAL | Status: DC

## 2017-08-04 MED ORDER — CLOPIDOGREL BISULFATE 75 MG PO TABS: 75.0000 mg | ORAL_TABLET | Freq: Every day | ORAL | 5 refills | Status: DC

## 2017-08-04 NOTE — Discharge Summary (Addendum)
[facility] VALVE TEAM   Discharge Summary    Patient ID: [name],  MRN: [id number], DOB/AGE: Dec 04, 1925 84 y.o.  Admit date: 08/02/2017 Discharge date: 08/04/2017  Primary Care Provider: [name] Primary Cardiologist: Dr. Tamala Julian / Dr. Angelena Form & Dr. Cyndia Bent (TAVR)   Discharge Diagnoses    Principal Problem:   S/P TAVR (transcatheter aortic valve replacement) Active Problems:   Chronic cough   Severe aortic stenosis   Coronary artery disease involving native coronary artery of native heart without angina pectoris   Essential hypertension, benign   Gastroesophageal reflux disease without esophagitis   Hyperlipemia   Hypertension   History of breast cancer   Acute on chronic diastolic heart failure (HCC)   Allergies Allergies  Allergen Reactions  . Betadine [Povidone Iodine]     blisters  . Latex Swelling    hives  . Zestril [Lisinopril]     cough     History of Present Illness     [name] is a 84 y.o. female with a history of CAD s/p PCI to RCA in 2010, CKD, HLD, HTN, hypothyroidism, breast cancer s/p lumpectomy and radiation, chronic diastolic CHF and severe AS who presented to [facility] on 08/02/17 for planned TAVR.  Her echo in January 2016 showed a moderately calcified aortic valve with a mean gradient of 18 mmHg and a valve area of 0.8 cm with normal left ventricular systolic function and an ejection fraction of 55 to 60%. She was felt to have moderate aortic stenosis and was asymptomatic. She then developed a 50-month history of progressive exertional fatigue and dyspnea. Follow-up echocardiogram on 06/08/2017 which showed a severely calcified trileaflet aortic valve with poor leaflet mobility. The mean gradient was 27 mmHg with a dimensionless index of 0.25. The aortic valve area was measured at 1.0 cm. Left ventricular ejection fraction was 60 to 65% with grade 1 diastolic dysfunction. Cardiac catheterization on  07/05/2017 showed a patent stent extending out of the right coronary ostium with about 70% narrowing. The left main was widely patent. The LAD had about 50 to 70% narrowing beyond the first diagonal branch and the first diagonal branch had about 90% stenosis. The ostium of the left circumflex had about 40 to 50% narrowing. The peak to peak gradient across aortic valve was 30 mmHg. Cardiac index was 4.23. PA pressure was 40/18 with a mean wedge pressure of 17. LVEDP was 18.   She was set up for TAVR, which was scheduled for 08/02/17.    Hospital Course     Consultants: none  Severe Aortic Stenosis: s/p successful TAVR with 72mm Edwards Sapien THV via the TF approach on 08/02/17. Post operative echo 08/03/17 showed EF 65%, normally functioning TAVR valve; peak/mean 28/14 mmHg and mild PVL. Groin sites stable. Continue ASA and Plavix. Plan for discharge home today with TOC follow up in 1 week.   Acute on chronic diastolic CHF: she had a mildly elevated BNP on pre admission labs. This has been treated with TAVR. Her home ARB-HCTZ combo has been resumed.   HTN: BP mild elevated. Home Benicar HCT and Toprol XL 25mg  daily resumed. Will follow as an outpatient.  Arthritis: we have asked her to stop diclofenac since she is on ASA and plavix. She has topical medications she can use.  CAD: continued medical therapy. She would benefit from a statin    The patient has had an uncomplicated hospital course and is recovering well. The  femoral catheter sites are stable. She has been seen by Dr. Cyndia Bent today and deemed ready for discharge home. All follow-up appointments have been scheduled. Discharge medications are listed below.  _____________  Discharge Vitals Blood pressure (!) 157/62, pulse 71, temperature 98.5 F (36.9 C), temperature source Oral, resp. rate 19, height 4\' 11"  (1.499 m), weight 192 lb (87.1 kg), SpO2 96 %.  Filed Weights   08/02/17 1032 08/03/17 0421 08/04/17 0402  Weight: 186 lb  (84.4 kg) 190 lb 7.6 oz (86.4 kg) 192 lb (87.1 kg)   VS:  BP (!) 157/62 (BP Location: Right Arm)   Pulse 71   Temp 98.5 F (36.9 C) (Oral)   Resp 19   Ht 4\' 11"  (1.499 m)   Wt 192 lb (87.1 kg)   SpO2 96%   BMI 38.78 kg/m    GEN: Well nourished, well developed, in no acute distress, obese HEENT: normal  Neck: no JVD, carotid bruits, or masses Cardiac: RRR; no murmurs, rubs, or gallops,no edema  Respiratory:  clear to auscultation bilaterally, normal work of breathing GI: soft, nontender, nondistended, + BS MS: no deformity or atrophy  Skin: warm and dry, no rash. Hematoma under left groin site on thigh. soft Neuro:  Alert and Oriented x 3, Strength and sensation are intact Psych: euthymic mood, full affect   Labs & Radiologic Studies     CBC Recent Labs    08/02/17 1517 08/02/17 1525 08/03/17 0249  WBC 7.9  --  9.5  HGB 9.5* 9.2* 9.5*  HCT 29.2* 27.0* 29.3*  MCV 89.6  --  90.4  PLT 198  --  161   Basic Metabolic Panel Recent Labs    08/02/17 1355 08/02/17 1525 08/03/17 0249  NA 138 139 133*  K 3.7 3.9 3.8  CL 104  --  100*  CO2  --   --  24  GLUCOSE 101* 113* 166*  BUN 14  --  13  CREATININE 0.80  --  0.96  CALCIUM  --   --  8.7*  MG  --   --  1.5*   Liver Function Tests No results for input(s): AST, ALT, ALKPHOS, BILITOT, PROT, ALBUMIN in the last 72 hours. No results for input(s): LIPASE, AMYLASE in the last 72 hours. Cardiac Enzymes No results for input(s): CKTOTAL, CKMB, CKMBINDEX, TROPONINI in the last 72 hours. BNP Invalid input(s): POCBNP D-Dimer No results for input(s): DDIMER in the last 72 hours. Hemoglobin A1C No results for input(s): HGBA1C in the last 72 hours. Fasting Lipid Panel No results for input(s): CHOL, HDL, LDLCALC, TRIG, CHOLHDL, LDLDIRECT in the last 72 hours. Thyroid Function Tests No results for input(s): TSH, T4TOTAL, T3FREE, THYROIDAB in the last 72 hours.  Invalid input(s): FREET3  Dg Chest 2 View  Result Date:  07/29/2017 CLINICAL DATA:  Preoperative evaluation for aortic valve surgery; history coronary artery disease, hypertension, former smoker, breast cancer EXAM: CHEST - 2 VIEW COMPARISON:  CT chest 07/25/2017 FINDINGS: Enlargement of cardiac silhouette. Atherosclerotic calcification aorta. Mediastinal contours and pulmonary vascularity normal. Bronchitic and emphysematous changes question COPD. No acute infiltrate, pleural effusion or pneumothorax. Bones demineralized. IMPRESSION: Enlargement of cardiac silhouette. COPD changes without acute infiltrate. Electronically Signed   By: Lavonia Dana M.D.   On: 07/29/2017 17:17   Ct Coronary Morph W/cta Cor W/score W/ca W/cm &/or Wo/cm  Addendum Date: 07/25/2017   ADDENDUM REPORT: 07/25/2017 17:34 CLINICAL DATA:  84 year old female with severe aortic stenosis being evaluated for a TAVR procedure. EXAM:  Cardiac TAVR CT TECHNIQUE: The patient was scanned on a [employer]. A 120 kV retrospective scan was triggered in the descending thoracic aorta at 111 HU's. Gantry rotation speed was 250 msecs and collimation was .6 mm. 5 mg of iv Metoprolol and no nitro were given. The 3D data set was reconstructed in 5% intervals of the R-R cycle. Systolic and diastolic phases were analyzed on a dedicated work station using MPR, MIP and VRT modes. The patient received 80 cc of contrast. FINDINGS: Aortic Valve: Trileaflet aortic valve with severely thickened and moderately calcified leaflets with severely reduced leaflets excursion. Only minimal calcifications are extending into the LVOT. Aorta: Normal size with mild calcifications in the ascending aorta but moderate to severe atherosclerosis and almost circumferential calcifications in the aortic arch and descending thoracic aorta. No dissection. Sinotubular Junction: 26 x 25 mm Ascending Thoracic Aorta: 30 x 29 mm Aortic Arch: 20 x 20 mm Descending Thoracic Aorta: 23 x 21 mm Sinus of Valsalva Measurements: Non-coronary: 30 mm  Right -coronary: 31 mm Left -coronary: 32 mm Coronary Artery Height above Annulus: Left Main: 17 mm Right Coronary: 18 mm Virtual Basal Annulus Measurements: Maximum/Minimum Diameter: 27.2 x 22.1 mm Mean Diameter: 24.8 mm Perimeter: 79.1 mm Area: 484 mm2 Coronary Arteries: NTG not used and scan not sufficient for coronary evaluation, however right ostial coronary stent is extending into the right coronary sinus (3.4 mm). However, RCA is 18 mm above the annulus. Optimum Fluoroscopic Angle for Delivery: LAO 8 CAU 7 Dilated pulmonary artery measuring 31 x 24 mm IMPRESSION: 1. Trileaflet aortic valve with severely thickened and moderately calcified leaflets with severely reduced leaflets excursion. Only minimal calcifications are extending into the LVOT. Annular measurements suitable for delivery of a 26 mm Edwards-SAPIEN 3 valve. 2. Sufficient coronary to annulus distance. 3. Optimum Fluoroscopic Angle for Delivery: LAO 8 CAU 7. 4. No thrombus in the left atrial appendage. 5. Normal size of the thoracic aorta with mild calcifications in the ascending aorta but moderate to severe atherosclerosis and almost circumferential calcifications in the aortic arch and descending thoracic aorta. No dissection. 6. Right ostial coronary stent is extending into the right coronary sinus (3.4 mm). However, RCA is 18 mm above the annulus and should not interfere with valve placement. Electronically Signed   By: Ena Dawley   On: 07/25/2017 17:34   Result Date: 07/25/2017 EXAM: OVER-READ INTERPRETATION  CT CHEST The following report is an over-read performed by radiologist Dr. Vinnie Langton of [facility], [place] on 07/25/2017. This over-read does not include interpretation of cardiac or coronary anatomy or pathology. The coronary calcium score/coronary CTA interpretation by the cardiologist is attached. COMPARISON:  Chest CT 03/13/2013. FINDINGS: Extracardiac findings will be described separately under dictation for  contemporaneously obtained CTA chest, abdomen and pelvis. IMPRESSION: Please see separate dictation for contemporaneously obtained CTA chest, abdomen and pelvis 07/25/2017 for full description of relevant extracardiac findings. Electronically Signed: By: Vinnie Langton M.D. On: 07/25/2017 12:27   Dg Chest Port 1 View  Result Date: 08/02/2017 CLINICAL DATA:  Postop TAVR EXAM: PORTABLE CHEST 1 VIEW COMPARISON:  07/29/2017 FINDINGS: TAVR in good position. Cardiac enlargement without heart failure or edema Mild bibasilar atelectasis.  No effusion Right jugular central venous catheter tip in the lower SVC. No pneumothorax IMPRESSION: Postop TAVR in good position.  Negative for heart failure Central line in the lower SVC without pneumothorax Aortic Atherosclerosis (ICD10-I70.0). Electronically Signed   By: Franchot Gallo M.D.   On: 08/02/2017 15:37  Ct Angio Chest Aorta W &/or Wo Contrast  Result Date: 07/25/2017 CLINICAL DATA:  84 year old female with history of severe aortic stenosis. Preprocedural study prior to potential transcatheter aortic valve replacement (TAVR) procedure. EXAM: CT ANGIOGRAPHY CHEST, ABDOMEN AND PELVIS TECHNIQUE: Multidetector CT imaging through the chest, abdomen and pelvis was performed using the standard protocol during bolus administration of intravenous contrast. Multiplanar reconstructed images and MIPs were obtained and reviewed to evaluate the vascular anatomy. CONTRAST:  132mL ISOVUE-370 IOPAMIDOL (ISOVUE-370) INJECTION 76% COMPARISON:  CT the chest, abdomen and pelvis 03/13/2013. FINDINGS: CTA CHEST FINDINGS Cardiovascular: Heart size is mildly enlarged. There is no significant pericardial fluid, thickening or pericardial calcification. There is aortic atherosclerosis, as well as atherosclerosis of the great vessels of the mediastinum and the coronary arteries, including calcified atherosclerotic plaque in the left anterior descending and right coronary arteries. Severe  thickening calcification of the aortic valve. Calcification of the mitral annulus. Mediastinum/Lymph Nodes: No pathologically enlarged mediastinal or hilar lymph nodes. Patulous esophagus. No axillary lymphadenopathy. Lungs/Pleura: No acute consolidative airspace disease. No pleural effusions. No suspicious appearing pulmonary nodules or masses. Musculoskeletal/Soft Tissues: There are no aggressive appearing lytic or blastic lesions noted in the visualized portions of the skeleton. CTA ABDOMEN AND PELVIS FINDINGS Hepatobiliary: Subcentimeter low-attenuation lesions are noted in the liver, too small to characterize, but statistically likely to represent tiny cysts. No other larger more suspicious appearing hepatic lesions are noted. No intra or extrahepatic biliary ductal dilatation. Status post cholecystectomy. Pancreas: No pancreatic mass. No pancreatic ductal dilatation. No pancreatic or peripancreatic fluid or inflammatory changes. Spleen: Unremarkable. Adrenals/Urinary Tract: Multiple well-defined low-attenuation nonenhancing lesions in both kidneys are compatible with simple cysts, largest of which measures 5.4 cm in the interpolar region of the right kidney. There are several other subcentimeter low-attenuation lesions in both kidneys which are too small to definitively characterize, but are statistically likely to represent tiny cysts. Calcifications in the right adrenal gland, likely from prior infection or hemorrhage. Left adrenal gland is normal in appearance. No hydroureteronephrosis. Urinary bladder is normal in appearance. Stomach/Bowel: The appearance of the stomach is normal. There is no pathologic dilatation of small bowel or colon. Numerous colonic diverticulae are noted, without surrounding inflammatory changes to suggest an acute diverticulitis at this time. The appendix is not confidently identified and may be surgically absent. Regardless, there are no inflammatory changes noted adjacent to the  cecum to suggest the presence of an acute appendicitis at this time. Vascular/Lymphatic: Extensive aortic atherosclerosis, with vascular findings and measurements pertinent to potential TAVR procedure, as detailed below. Celiac axis, superior mesenteric artery and inferior mesenteric artery are all patent without definite hemodynamically significant stenosis. Single renal arteries are both patent without hemodynamically significant stenosis. No lymphadenopathy noted in the abdomen or pelvis. Reproductive: Status post hysterectomy. Ovaries are not confidently identified may be surgically absent or atrophic. Other: Small epigastric ventral hernia containing only fat. No significant volume of ascites. No pneumoperitoneum. Musculoskeletal: There are no aggressive appearing lytic or blastic lesions noted in the visualized portions of the skeleton. VASCULAR MEASUREMENTS PERTINENT TO TAVR: AORTA: Minimal Aortic Diameter-12 x 12 mm Severity of Aortic Calcification-severe RIGHT PELVIS: Right Common Iliac Artery - Minimal Diameter-12.1 x 9.7 mm Tortuosity-mild Calcification-moderate Right External Iliac Artery - Minimal Diameter-8.4 x 8.8 mm Tortuosity-mild Calcification-none Right Common Femoral Artery - Minimal Diameter-8.5 x 8.7 mm Tortuosity - mild Calcification-mild LEFT PELVIS: Left Common Iliac Artery - Minimal Diameter-10.5 x 8.1 mm Tortuosity - mild Calcification-moderate Left External Iliac Artery - Minimal  Diameter-8.1 x 7.8 mm Tortuosity - mild Calcification-none Left Common Femoral Artery - Minimal Diameter-8.4 x 8.0 mm Tortuosity - mild Calcification-mild Review of the MIP images confirms the above findings. IMPRESSION: 1. Vascular findings and measurements pertinent to potential TAVR procedure, as detailed above. 2. Severe thickening calcification of the aortic valve, compatible with the reported clinical history of severe aortic stenosis. 3. Aortic atherosclerosis, in addition to 2 vessel coronary artery  disease. 4. Mild cardiomegaly. 5. Small epigastric ventral hernia containing only omental fat. No associated bowel incarceration or obstruction at this time. 6. Additional incidental findings, as above. Aortic Atherosclerosis (ICD10-I70.0). Electronically Signed   By: Vinnie Langton M.D.   On: 07/25/2017 14:51   Ct Angio Abd/pel W/ And/or W/o  Result Date: 07/25/2017 CLINICAL DATA:  84 year old female with history of severe aortic stenosis. Preprocedural study prior to potential transcatheter aortic valve replacement (TAVR) procedure. EXAM: CT ANGIOGRAPHY CHEST, ABDOMEN AND PELVIS TECHNIQUE: Multidetector CT imaging through the chest, abdomen and pelvis was performed using the standard protocol during bolus administration of intravenous contrast. Multiplanar reconstructed images and MIPs were obtained and reviewed to evaluate the vascular anatomy. CONTRAST:  156mL ISOVUE-370 IOPAMIDOL (ISOVUE-370) INJECTION 76% COMPARISON:  CT the chest, abdomen and pelvis 03/13/2013. FINDINGS: CTA CHEST FINDINGS Cardiovascular: Heart size is mildly enlarged. There is no significant pericardial fluid, thickening or pericardial calcification. There is aortic atherosclerosis, as well as atherosclerosis of the great vessels of the mediastinum and the coronary arteries, including calcified atherosclerotic plaque in the left anterior descending and right coronary arteries. Severe thickening calcification of the aortic valve. Calcification of the mitral annulus. Mediastinum/Lymph Nodes: No pathologically enlarged mediastinal or hilar lymph nodes. Patulous esophagus. No axillary lymphadenopathy. Lungs/Pleura: No acute consolidative airspace disease. No pleural effusions. No suspicious appearing pulmonary nodules or masses. Musculoskeletal/Soft Tissues: There are no aggressive appearing lytic or blastic lesions noted in the visualized portions of the skeleton. CTA ABDOMEN AND PELVIS FINDINGS Hepatobiliary: Subcentimeter  low-attenuation lesions are noted in the liver, too small to characterize, but statistically likely to represent tiny cysts. No other larger more suspicious appearing hepatic lesions are noted. No intra or extrahepatic biliary ductal dilatation. Status post cholecystectomy. Pancreas: No pancreatic mass. No pancreatic ductal dilatation. No pancreatic or peripancreatic fluid or inflammatory changes. Spleen: Unremarkable. Adrenals/Urinary Tract: Multiple well-defined low-attenuation nonenhancing lesions in both kidneys are compatible with simple cysts, largest of which measures 5.4 cm in the interpolar region of the right kidney. There are several other subcentimeter low-attenuation lesions in both kidneys which are too small to definitively characterize, but are statistically likely to represent tiny cysts. Calcifications in the right adrenal gland, likely from prior infection or hemorrhage. Left adrenal gland is normal in appearance. No hydroureteronephrosis. Urinary bladder is normal in appearance. Stomach/Bowel: The appearance of the stomach is normal. There is no pathologic dilatation of small bowel or colon. Numerous colonic diverticulae are noted, without surrounding inflammatory changes to suggest an acute diverticulitis at this time. The appendix is not confidently identified and may be surgically absent. Regardless, there are no inflammatory changes noted adjacent to the cecum to suggest the presence of an acute appendicitis at this time. Vascular/Lymphatic: Extensive aortic atherosclerosis, with vascular findings and measurements pertinent to potential TAVR procedure, as detailed below. Celiac axis, superior mesenteric artery and inferior mesenteric artery are all patent without definite hemodynamically significant stenosis. Single renal arteries are both patent without hemodynamically significant stenosis. No lymphadenopathy noted in the abdomen or pelvis. Reproductive: Status post hysterectomy. Ovaries are  not  confidently identified may be surgically absent or atrophic. Other: Small epigastric ventral hernia containing only fat. No significant volume of ascites. No pneumoperitoneum. Musculoskeletal: There are no aggressive appearing lytic or blastic lesions noted in the visualized portions of the skeleton. VASCULAR MEASUREMENTS PERTINENT TO TAVR: AORTA: Minimal Aortic Diameter-12 x 12 mm Severity of Aortic Calcification-severe RIGHT PELVIS: Right Common Iliac Artery - Minimal Diameter-12.1 x 9.7 mm Tortuosity-mild Calcification-moderate Right External Iliac Artery - Minimal Diameter-8.4 x 8.8 mm Tortuosity-mild Calcification-none Right Common Femoral Artery - Minimal Diameter-8.5 x 8.7 mm Tortuosity - mild Calcification-mild LEFT PELVIS: Left Common Iliac Artery - Minimal Diameter-10.5 x 8.1 mm Tortuosity - mild Calcification-moderate Left External Iliac Artery - Minimal Diameter-8.1 x 7.8 mm Tortuosity - mild Calcification-none Left Common Femoral Artery - Minimal Diameter-8.4 x 8.0 mm Tortuosity - mild Calcification-mild Review of the MIP images confirms the above findings. IMPRESSION: 1. Vascular findings and measurements pertinent to potential TAVR procedure, as detailed above. 2. Severe thickening calcification of the aortic valve, compatible with the reported clinical history of severe aortic stenosis. 3. Aortic atherosclerosis, in addition to 2 vessel coronary artery disease. 4. Mild cardiomegaly. 5. Small epigastric ventral hernia containing only omental fat. No associated bowel incarceration or obstruction at this time. 6. Additional incidental findings, as above. Aortic Atherosclerosis (ICD10-I70.0). Electronically Signed   By: Vinnie Langton M.D.   On: 07/25/2017 14:51     Diagnostic Studies/Procedures    TAVR OPERATIVE NOTE   Date of Procedure:08/02/2017  Preoperative Diagnosis:Severe Aortic Stenosis   Postoperative Diagnosis:Same    Procedure:   Transcatheter Aortic Valve Replacement - Transfemoral Approach Edwards Sapien 3 THV (size 3mm, model # [id number], serial #[id number])  Co-Surgeons:Christopher Angelena Form, MD and Gaye Pollack, MD   Pre-operative Echo Findings: ? Severe aortic stenosis ? Normalleft ventricular systolic function  Post-operative Echo Findings: ? Noparavalvular leak ? Normalleft ventricular systolic function  _____________   Post operative echo 08/03/17: Study Conclusions - Left ventricle: The cavity size was normal. Systolic function was   vigorous. The estimated ejection fraction was in the range of 65%   to 70%. Wall motion was normal; there were no regional wall   motion abnormalities. - Aortic valve: Mean gradient (S): 14 mm Hg. Valve area (VTI): 1.76   cm^2. Valve area (Vmax): 1.43 cm^2. Valve area (Vmean): 1.72   cm^2. - Mitral valve: Calcified annulus. Mildly thickened leaflets .   There was mild regurgitation. Valve area by pressure half-time:   1.96 cm^2. - Right ventricle: Systolic function was normal. - Right atrium: The atrium was mildly dilated. - Tricuspid valve: There was moderate regurgitation. - Pulmonary arteries: Systolic pressure was moderately increased.   PA peak pressure: 54 mm Hg (S). - Inferior vena cava: The vessel was normal in size. Impressions: - S/P TAVR with a 26 mm Edwards-SAPIEN 3 valve. Normal transaortic   velocities with peak/mean 28/14 mmHg and mild paravalvular leak.    Disposition   Pt is being discharged home today in good condition.  Follow-up Plans & Appointments    Follow-up Information    Eileen Stanford, PA-C. Go on 08/11/2017.   Specialties:  Cardiology, Radiology Why:  @ 2:30pm Contact information: [address] [place] [address] [phone]            Discharge Medications     Medication List    STOP taking these medications    diclofenac 50 MG EC tablet Commonly known as:  VOLTAREN   ibuprofen 200 MG tablet  Commonly known as:  ADVIL,MOTRIN     TAKE these medications   acetaminophen 500 MG tablet Commonly known as:  TYLENOL Take 1,000 mg by mouth daily as needed (sinus headaches).   ALPRAZolam 0.25 MG tablet Commonly known as:  XANAX Take 0.25 mg by mouth 3 (three) times daily as needed for anxiety or sleep.   aspirin 81 MG EC tablet Take 1 tablet (81 mg total) by mouth daily.   buPROPion 150 MG 12 hr tablet Commonly known as:  WELLBUTRIN SR Take 150 mg by mouth daily before breakfast.   citalopram 10 MG tablet Commonly known as:  CELEXA Take 10 mg by mouth at bedtime.   clopidogrel 75 MG tablet Commonly known as:  PLAVIX Take 1 tablet (75 mg total) by mouth daily with breakfast.   eszopiclone 2 MG Tabs tablet Commonly known as:  LUNESTA Take 1-2 mg by mouth at bedtime.   HYDROcodone-acetaminophen 5-325 MG tablet Commonly known as:  NORCO/VICODIN Take 1-2 tablets by mouth every 6 (six) hours as needed for moderate pain.   levothyroxine 50 MCG tablet Commonly known as:  SYNTHROID, LEVOTHROID Take 50 mcg by mouth once daily   metoprolol succinate 25 MG 24 hr tablet Commonly known as:  TOPROL-XL Take 25 mg by mouth once daily   olmesartan-hydrochlorothiazide 40-12.5 MG tablet Commonly known as:  BENICAR HCT Take 1 tablet by mouth daily.         Outstanding Labs/Studies   none  Duration of Discharge Encounter   Greater than 30 minutes including physician time.  Signed, Angelena Form PA-C 08/04/2017, 8:25 AM

## 2017-08-04 NOTE — Care Management Note (Signed)
Case Management Note Marvetta Gibbons RN, BSN Unit 4E-Case Manager [phone]  Patient Details  Name: [name] MRN: [id number] Date of Birth: 24-Apr-1925  Subjective/Objective:  Pt admitted s/p TAVR                Action/Plan: PTA pt lived at home- plan to return home- no CM needs noted for transition home  Expected Discharge Date:  08/04/17               Expected Discharge Plan:  Home/Self Care  In-House Referral:     Discharge planning Services  CM Consult  Post Acute Care Choice:  NA Choice offered to:  NA  DME Arranged:    DME Agency:     HH Arranged:    [place] Agency:     Status of Service:  Completed, signed off  If discussed at [place] of [facility] Meetings, dates discussed:    Discharge Disposition: home/self care   Additional Comments:  Dawayne Patricia, RN 08/04/2017, 11:25 AM

## 2017-08-04 NOTE — Progress Notes (Signed)
CARDIAC REHAB PHASE I   PRE:  Rate/Rhythm: 86 SR    BP: sitting 154/79    SaO2:   MODE:  Ambulation: 240 ft   POST:  Rate/Rhythm: 95 SR with PVCs    BP: sitting 125/74     SaO2:   Pt in bathroom on my arrival, c/o cramping and gas. Able to stand and walk independently with her tripod rolling walker. No c/o. To recliner with VSS. Had to get up again for BR. Doing well. [id number]  Ben [place], ACSM 08/04/2017 9:43 AM

## 2017-08-04 NOTE — Plan of Care (Signed)
  Problem: Clinical Measurements: Goal: Diagnostic test results will improve Outcome: Progressing Goal: Respiratory complications will improve Outcome: Progressing   

## 2017-08-05 ENCOUNTER — Telehealth: Payer: Self-pay | Admitting: Physician Assistant

## 2017-08-05 NOTE — Telephone Encounter (Signed)
  [place] VALVE TEAM   Patient contacted regarding discharge from [facility] on 08/04/17  Patient understands to follow up with provider Nell Range on 5/30 at [place].  Patient understands discharge instructions? yes Patient understands medications and regiment? yes Patient understands to bring all medications to this visit? Yes  Wanted to know who is a good allergist in town and has noticed some exertional wheezing. They will watch this and call us if worsening.  Angelena Form PA-C  [facility]

## 2017-08-10 NOTE — Progress Notes (Signed)
[facility] [place]                                       Cardiology Office Note    Date:  08/11/2017   ID:  [name], DOB 09-03-1925, MRN [id number]  PCP:  [name]., MD  Cardiologist: Dr. Tamala Julian / Dr. Angelena Form & Dr. Cyndia Bent (TAVR)  CC: [facility] s/p TAVR  History of Present Illness:  [name] is a 84 y.o. female with a history of CAD s/p PCI to RCA in 2010, CKD, HLD, HTN, hypothyroidism, breast cancer s/p lumpectomy and radiation, chronic diastolic CHF and severe AS s/p TAVR (08/02/17) who presents to clinic for follow up.   Echo in 03/2014 showed a moderately calcified aortic valve with a mean gradient of 18 mmHg and a valve area of 0.8 cmwith normal left ventricular systolic function and an ejection fraction of 55 to 60%. She was felt to have moderate aortic stenosis and was asymptomatic. She then developed a 47-month history of progressive exertional fatigue and dyspnea. Follow-up echocardiogram on 06/08/2017 which showed a severely calcified trileaflet aortic valve with poor leaflet mobility. The mean gradient was 27 mmHg with a dimensionless index of 0.25. The aortic valve area was measured at 1.0 cm. Left ventricular ejection fraction was 60 to 65% with grade 1 diastolic dysfunction. Cardiac catheterization on 07/05/2017 showed a patent stent extending out of the right coronary ostium with about 70% narrowing. The left main was widely patent. The LAD had about 50 to 70% narrowing beyond the first diagonal branch and the first diagonal branch had about 90% stenosis. The ostium of the left circumflex had about 40 to 50% narrowing. The peak to peak gradient across aortic valve was 30 mmHg. Cardiac index was 4.23. PA pressure was 40/18 with a mean wedge pressure of 17. LVEDP was 18.   She underwent successful TAVR with 52mm Edwards Sapien THV via the TF approach on 08/02/17. Post operative echo 08/03/17 showed EF 65%, normally functioning  TAVR valve;peak/mean 28/14 mmHg and mild PVL. Post op course was uncomplicated. She was discharged on ASA and plavix.   Today she presents to clinic for follow up. She has been doing well. Still has SOB with exertion. She was hoping breathing would have been better after surgery. She does admit to continuing her sedentary lifestyle. No CP. No LE edema, orthopnea or PND. No dizziness or syncope. No blood in stool or urine. No palpitations.    Past Medical History:  Diagnosis Date  . Aortic stenosis, severe    a. 07/2017: s/p TAVR w/ an Edwards Sapien 3 THV (size 26 mm, model # [id number], serial # [id number])  . CAD (coronary artery disease)    a. 2010: s/p stent to RCA  . Chronic kidney disease, stage III (moderate) (HCC)   . Chronic sinusitis   . Depressive disorder   . History of breast cancer    a. s/p lumpectomies and XRT  . Hyperlipemia   . Hypertension   . Hypothyroid   . Insomnia   . Memory loss   . Osteoarthritis   . Reflux esophagitis     Past Surgical History:  Procedure Laterality Date  . ANGIOPLASTY    . BREAST LUMPECTOMY     x2  . FOOT TENDON SURGERY    . GALLBLADDER SURGERY    . INTRAOPERATIVE  TRANSTHORACIC ECHOCARDIOGRAM N/A 08/02/2017   Procedure: INTRAOPERATIVE TRANSTHORACIC ECHOCARDIOGRAM;  Surgeon: Burnell Blanks, MD;  Location: [facility];  Service: Open Heart Surgery;  Laterality: N/A;  . REPLACEMENT TOTAL KNEE BILATERAL    . RIGHT/LEFT HEART CATH AND CORONARY ANGIOGRAPHY N/A 07/05/2017   Procedure: RIGHT/LEFT HEART CATH AND CORONARY ANGIOGRAPHY;  Surgeon: Belva Crome, MD;  Location: [facility];  Service: Cardiovascular;  Laterality: N/A;  . SKIN CANCER EXCISION  2018   right nostril   . TOTAL ABDOMINAL HYSTERECTOMY    . TRANSCATHETER AORTIC VALVE REPLACEMENT, TRANSFEMORAL N/A 08/02/2017   Procedure: TRANSCATHETER AORTIC VALVE REPLACEMENT, TRANSFEMORAL;  Surgeon: Burnell Blanks, MD;  Location: [facility];  Service: Open Heart Surgery;   Laterality: N/A;    Current Medications: Outpatient Medications Prior to Visit  Medication Sig Dispense Refill  . acetaminophen (TYLENOL) 500 MG tablet Take 1,000 mg by mouth daily as needed (sinus headaches).    . ALPRAZolam (XANAX) 0.25 MG tablet Take 0.25 mg by mouth 3 (three) times daily as needed for anxiety or sleep.     Marland Kitchen aspirin EC 81 MG EC tablet Take 1 tablet (81 mg total) by mouth daily.    Marland Kitchen buPROPion (WELLBUTRIN SR) 150 MG 12 hr tablet Take 150 mg by mouth daily before breakfast.     . citalopram (CELEXA) 10 MG tablet Take 10 mg by mouth at bedtime.     . clopidogrel (PLAVIX) 75 MG tablet Take 1 tablet (75 mg total) by mouth daily with breakfast. 30 tablet 5  . eszopiclone (LUNESTA) 2 MG TABS tablet Take 1-2 mg by mouth at bedtime.     Marland Kitchen HYDROcodone-acetaminophen (NORCO/VICODIN) 5-325 MG tablet Take 1-2 tablets by mouth every 6 (six) hours as needed for moderate pain.    Marland Kitchen levothyroxine (SYNTHROID, LEVOTHROID) 50 MCG tablet Take 50 mcg by mouth once daily    . metoprolol succinate (TOPROL-XL) 25 MG 24 hr tablet Take 25 mg by mouth once daily    . olmesartan-hydrochlorothiazide (BENICAR HCT) 40-12.5 MG tablet Take 1 tablet by mouth daily.     No facility-administered medications prior to visit.      Allergies:   Betadine [povidone iodine]; Latex; and Zestril [lisinopril]   Social History   Socioeconomic History  . Marital status: Widowed    Spouse name: Not on file  . Number of children: 3  . Years of education: Not on file  . Highest education level: Not on file  Occupational History  . 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 Financial resource strain: Not on file  . Food insecurity:    Worry: Not on file    Inability: Not on file  . Transportation needs:    Medical: Not on file    Non-medical: Not on file  Tobacco Use  . Smoking status: Former Smoker    Packs/day: 0.50    Years: 20.00    Pack years: 10.00    Types:  Cigarettes    Last attempt to quit: 03/15/1964    Years since quitting: 53.4  . Smokeless tobacco: Never Used  Substance and Sexual Activity  . Alcohol use: Yes    Comment: social  . Drug use: No  . Sexual activity: Not on file  Lifestyle  . Physical activity:    Days per week: Not on file    Minutes per session: Not on file  . Stress: Not on file  Relationships  . Social connections:  Talks on phone: Not on file    Gets together: Not on file    Attends religious service: Not on file    Active member of club or organization: Not on file    Attends meetings of clubs or organizations: Not on file    Relationship status: Not on file  Other Topics Concern  . Not on file  Social History Narrative  . Not on file     Family History:  The patient's family history includes Breast cancer in her sister; Heart disease in her father and maternal grandfather; Stroke in her mother.      ROS:   Please see the history of present illness.    ROS All other systems reviewed and are negative.   PHYSICAL EXAM:   VS:  BP (!) 116/58   Pulse 67   Ht 4\' 11"  (1.499 m)   Wt 191 lb (86.6 kg)   SpO2 95%   BMI 38.58 kg/m    GEN: Well nourished, well developed, in no acute distress, obese HEENT: normal  Neck: no JVD, carotid bruits, or masses Cardiac:  RRR; no murmurs, rubs, or gallops,no edema  Respiratory:  clear to auscultation bilaterally, normal work of breathing GI: soft, nontender, nondistended, + BS MS: no deformity or atrophy  Skin: warm and dry, no rash. Hematoma on left thigh but both groins healing well and soft Neuro:  Alert and Oriented x 3, Strength and sensation are intact Psych: euthymic mood, full affect    Wt Readings from Last 3 Encounters:  08/11/17 191 lb (86.6 kg)  08/04/17 192 lb (87.1 kg)  07/29/17 190 lb (86.2 kg)      Studies/Labs Reviewed:   EKG:  EKG is ordered today.  The ekg ordered today demonstrates Sinus with 1st deg AV block and new LBBB  Recent  Labs: 05/19/2017: NT-Pro BNP 594 07/29/2017: ALT 15; B Natriuretic Peptide 281.2 08/03/2017: BUN 13; Creatinine, Ser 0.96; Hemoglobin 9.5; Magnesium 1.5; Platelets 214; Potassium 3.8; Sodium 133   Lipid Panel No results found for: CHOL, TRIG, HDL, CHOLHDL, VLDL, LDLCALC, LDLDIRECT  Additional studies/ records that were reviewed today include:  TAVR OPERATIVE NOTE   Date of Procedure:08/02/2017  Preoperative Diagnosis:Severe Aortic Stenosis   Postoperative Diagnosis:Same   Procedure:   Transcatheter Aortic Valve Replacement - Transfemoral Approach Edwards Sapien 3 THV (size 61mm, model # [id number], serial #[id number])  Co-Surgeons:Christopher Angelena Form, MD and Gaye Pollack, MD   Pre-operative Echo Findings: ? Severe aortic stenosis ? Normalleft ventricular systolic function  Post-operative Echo Findings: ? Noparavalvular leak ? Normalleft ventricular systolic function  _________________   Post operative echo 08/03/17: Study Conclusions - Left ventricle: The cavity size was normal. Systolic function was vigorous. The estimated ejection fraction was in the range of 65% to 70%. Wall motion was normal; there were no regional wall motion abnormalities. - Aortic valve: Mean gradient (S): 14 mm Hg. Valve area (VTI): 1.76 cm^2. Valve area (Vmax): 1.43 cm^2. Valve area (Vmean): 1.72 cm^2. - Mitral valve: Calcified annulus. Mildly thickened leaflets . There was mild regurgitation. Valve area by pressure half-time: 1.96 cm^2. - Right ventricle: Systolic function was normal. - Right atrium: The atrium was mildly dilated. - Tricuspid valve: There was moderate regurgitation. - Pulmonary arteries: Systolic pressure was moderately increased. PA peak pressure: 54 mm Hg (S). - Inferior vena cava: The vessel was normal in size. Impressions: - S/P TAVR with a 26 mm Edwards-SAPIEN 3 valve.  Normal transaortic velocities with peak/mean 28/14 mmHg and mild  paravalvular leak.   ASSESSMENT & PLAN:   SevereAortic Stenosis s/p TAVR: doing well after her TAVR. Groin sites are stable. ECG with new LBBB but no high grade block. She is disappointed that her breathing has not improved more. She does think she is deconditioned. I have encouraged her to participate in cardiac rehab to increase her stamina. Continue ASA and plavix. SBE prophylaxis discussed. She has Amoxil from her dentist. I will see her back next month for an echo and follow up in the valve clinic.  Chronic diastolic CHF: she appears euvolemic. Continue Benicar HCT  HTN: BP well controlled today  Arthritis: she has been taking Tylenol for this.   Medication Adjustments/Labs and Tests Ordered: Current medicines are reviewed at length with the patient today.  Concerns regarding medicines are outlined above.  Medication changes, Labs and Tests ordered today are listed in the Patient Instructions below. Patient Instructions  Medication Instructions:  Your physician recommends that you continue on your current medications as directed. Please refer to the Current Medication list given to you today.   Labwork: none  Testing/Procedures: Your physician has requested that you have an echocardiogram. Echocardiography is a painless test that uses sound waves to create images of your heart. It provides your doctor with information about the size and shape of your heart and how well your heart's chambers and valves are working. This procedure takes approximately one hour. There are no restrictions for this procedure.  Scheduled for June 20 ( on day of appointment with K. Maggie Font, [place])  Follow-Up: Follow up as planned with K. Grandville Silos, [place] on June 20  Any Other Special Instructions Will Be Listed Below (If Applicable).   We will make referral to Cardiac Rehab in [place] discussed the importance of taking  an antibiotic prior to any dental, gastrointestinal, genitourinary procedures to prevent damage to the heart valves from infection. You were given a prescription for an antibiotic based on current SBE prophylaxis guidelines.     If you need a refill on your cardiac medications before your next appointment, please call your pharmacy.      Signed, Angelena Form, PA-C  08/11/2017 3:08 PM    [facility] [address], [place], [place]  [address] Phone: [phone]; Fax: [phone]

## 2017-08-11 ENCOUNTER — Encounter: Payer: Self-pay | Admitting: Physician Assistant

## 2017-08-11 ENCOUNTER — Ambulatory Visit (INDEPENDENT_AMBULATORY_CARE_PROVIDER_SITE_OTHER): Payer: Medicare Other | Admitting: Physician Assistant

## 2017-08-11 VITALS — BP 116/58 | HR 67 | Ht 59.0 in | Wt 191.0 lb

## 2017-08-11 DIAGNOSIS — M1990 Unspecified osteoarthritis, unspecified site: Secondary | ICD-10-CM | POA: Diagnosis not present

## 2017-08-11 DIAGNOSIS — I5032 Chronic diastolic (congestive) heart failure: Secondary | ICD-10-CM

## 2017-08-11 DIAGNOSIS — Z952 Presence of prosthetic heart valve: Secondary | ICD-10-CM | POA: Diagnosis not present

## 2017-08-11 DIAGNOSIS — I10 Essential (primary) hypertension: Secondary | ICD-10-CM | POA: Diagnosis not present

## 2017-08-11 NOTE — Addendum Note (Signed)
Addended by: Thompson Grayer on: 08/11/2017 03:14 PM   Modules accepted: Orders

## 2017-08-11 NOTE — Patient Instructions (Addendum)
Medication Instructions:  Your physician recommends that you continue on your current medications as directed. Please refer to the Current Medication list given to you today.   Labwork: none  Testing/Procedures: Your physician has requested that you have an echocardiogram. Echocardiography is a painless test that uses sound waves to create images of your heart. It provides your doctor with information about the size and shape of your heart and how well your heart's chambers and valves are working. This procedure takes approximately one hour. There are no restrictions for this procedure.  Scheduled for June 20 ( on day of appointment with K. Maggie Font, [place])  Follow-Up: Follow up as planned with K. Grandville Silos, [place] on June 20  Any Other Special Instructions Will Be Listed Below (If Applicable).   We will make referral to Cardiac Rehab in [place] discussed the importance of taking an antibiotic prior to any dental, gastrointestinal, genitourinary procedures to prevent damage to the heart valves from infection. You were given a prescription for an antibiotic based on current SBE prophylaxis guidelines.     If you need a refill on your cardiac medications before your next appointment, please call your pharmacy.

## 2017-08-18 ENCOUNTER — Encounter: Payer: Self-pay | Admitting: Thoracic Surgery (Cardiothoracic Vascular Surgery)

## 2017-08-22 ENCOUNTER — Telehealth: Payer: Self-pay | Admitting: Cardiovascular Disease

## 2017-08-22 NOTE — Telephone Encounter (Signed)
Pt calling concerning an appt for cardiac rehab. Pt states she would like to be referred to cardiac rehab in [place]. I spoke with staff at [facility] rehab, she states they did not have a referral on file. She states pt can attend cardiac rehab orientation tomorrow at 9 AM. I informed pt of cardiac rehab orientation tomorrow. She is unable to attend tomorrow. I provided pt with [facility] cardiac rehab number [phone] to follow up. Referral order placed.   While on the phone pt states she is having aches in her hands from her arthritis and would like to know if she could get accpuncture therapy to help with symptoms. Informed pt I would forward to Angelena Form, PA for further recommendation. She states understanding and thankful for the call.

## 2017-08-22 NOTE — Telephone Encounter (Signed)
New message   Patient calling with questions about starting cardiac rehab.

## 2017-08-23 ENCOUNTER — Telehealth: Payer: Self-pay

## 2017-08-23 NOTE — Telephone Encounter (Signed)
   [facility] Pre-operative Risk Assessment    Request for surgical clearance:  1. What type of surgery is being performed? Dental cleaning  2. When is this surgery scheduled? TBD  3. What type of clearance is required (medical clearance vs. Pharmacy clearance to hold med vs. Both)? both  4. Are there any medications that need to be held prior to surgery and how long? None listed   5. Practice name and name of physician performing surgery? Dr. Maryella Shivers   6. What is your office phone number [phone]   7.   What is your office fax number [phone]  8.   Anesthesia type (None, local, MAC, general) ? None specified     08/23/2017, 12:58 PM  _________________________________________________________________   (provider comments below)

## 2017-08-24 NOTE — Telephone Encounter (Signed)
Yes totally fine to get acupuncture.

## 2017-08-24 NOTE — Telephone Encounter (Signed)
Informed pt that its okay to get acupunture. She stated understanding and thankful for the call

## 2017-08-25 NOTE — Telephone Encounter (Signed)
Tried to call Dr. Venita Sheffield office, office is closed on vacation till 2/18.  Looking through [facility] note looks like dental office has given pt SBE for dental cleanings.  S/w Theodosia Quay, RN, SBE is four Amoxcillin  (500 mg) tablets one hour prior to dental procedure.  Will call pt to see if pt has received amox and if not will call pt script in, Lauren stated to give pt #8 due to going back to dentist.

## 2017-08-25 NOTE — Telephone Encounter (Signed)
S/w daughter per [facility]) is aware pt needs SBE before dental treatment, thinks pt already has the amoxicillin will call if needed, will wait to call dentist next week.

## 2017-08-26 NOTE — Telephone Encounter (Signed)
   Primary Cardiologist: [name], MD  Callback staff: Letter sent to dentist.  Please make sure fax received.  Chart reviewed as part of pre-operative protocol coverage.   Patient with hx of CAD s/p prior PCI to RCA in 0923, CKD, diastolic CHF, aortic stenosis s/p TAVR 08/02/17.  Per office protocol, no formal clearance is needed for simple dental cleanings or procedures.  I reviewed with our structural heart clinic physician assistant.  There is no need to postpone dental cleaning for a specified timeframe after TAVR.  The patient should continue on ASA and Plavix without interruption.  The patient also needs to take antibiotic prophylaxis to prevent SBE.  Letter will be faxed to dentist.  The note will be removed from the preop pool.   Richardson Dopp, PA-C 08/26/2017, 11:00 AM

## 2017-08-26 NOTE — Telephone Encounter (Signed)
Called Dr. Venita Sheffield office to confirm that they received a fax from [place] re: pts clearance. The office answering machine said office was closed for vacation and will reopen 08/30/17.

## 2017-08-30 NOTE — Telephone Encounter (Signed)
Called Dr.Roma Cheek's office to see if they had received the surgical clearance from via fax and she said they had not received it . I have resent the form to the requesting office.

## 2017-09-01 ENCOUNTER — Encounter (INDEPENDENT_AMBULATORY_CARE_PROVIDER_SITE_OTHER): Payer: Self-pay

## 2017-09-01 ENCOUNTER — Other Ambulatory Visit: Payer: Self-pay

## 2017-09-01 ENCOUNTER — Ambulatory Visit (HOSPITAL_COMMUNITY): Payer: Medicare Other | Attending: Cardiology

## 2017-09-01 ENCOUNTER — Ambulatory Visit (INDEPENDENT_AMBULATORY_CARE_PROVIDER_SITE_OTHER): Payer: Medicare Other | Admitting: Physician Assistant

## 2017-09-01 ENCOUNTER — Encounter: Payer: Self-pay | Admitting: Physician Assistant

## 2017-09-01 VITALS — BP 126/70 | HR 69 | Ht 59.0 in | Wt 186.4 lb

## 2017-09-01 DIAGNOSIS — I10 Essential (primary) hypertension: Secondary | ICD-10-CM | POA: Diagnosis not present

## 2017-09-01 DIAGNOSIS — N189 Chronic kidney disease, unspecified: Secondary | ICD-10-CM | POA: Insufficient documentation

## 2017-09-01 DIAGNOSIS — Z952 Presence of prosthetic heart valve: Secondary | ICD-10-CM

## 2017-09-01 DIAGNOSIS — I2510 Atherosclerotic heart disease of native coronary artery without angina pectoris: Secondary | ICD-10-CM | POA: Diagnosis not present

## 2017-09-01 DIAGNOSIS — I5032 Chronic diastolic (congestive) heart failure: Secondary | ICD-10-CM

## 2017-09-01 DIAGNOSIS — I129 Hypertensive chronic kidney disease with stage 1 through stage 4 chronic kidney disease, or unspecified chronic kidney disease: Secondary | ICD-10-CM | POA: Diagnosis not present

## 2017-09-01 DIAGNOSIS — M1990 Unspecified osteoarthritis, unspecified site: Secondary | ICD-10-CM

## 2017-09-01 DIAGNOSIS — I348 Other nonrheumatic mitral valve disorders: Secondary | ICD-10-CM | POA: Insufficient documentation

## 2017-09-01 DIAGNOSIS — E785 Hyperlipidemia, unspecified: Secondary | ICD-10-CM | POA: Diagnosis not present

## 2017-09-01 DIAGNOSIS — E039 Hypothyroidism, unspecified: Secondary | ICD-10-CM | POA: Diagnosis not present

## 2017-09-01 NOTE — Progress Notes (Signed)
HEART AND [place]                                       Cardiology Office Note    Date:  09/02/2017   ID:  [name], DOB 1925/05/15, MRN [id number]  PCP:  [name], MD  Cardiologist:  Dr. Tamala Julian / Dr. Angelena Form & Dr. Cyndia Bent (TAVR)  CC: 1 month s/p TAVR  History of Present Illness:  [name] is a 84 y.o. female with a history of CAD s/p PCI to RCA in 2010, CKD, HLD, HTN, hypothyroidism, breast cancer s/p lumpectomy and radiation, chronic diastolic CHF and severe AS s/p TAVR (08/02/17) who presents to clinic for follow up.   Echo in 03/2014 showed a moderately calcified aortic valve with a mean gradient of 18 mmHg and a valve area of 0.8 cmwith normal left ventricular systolic function and an ejection fraction of 55 to 60%. She was felt to have moderate aortic stenosis and was asymptomatic. Shethen developed a65-month history of progressive exertional fatigue and dyspnea.Follow-up echocardiogram on 06/08/2017 which showed a severely calcified trileaflet aortic valve with poor leaflet mobility. The mean gradient was 27 mmHg with a dimensionless index of 0.25. The aortic valve area was measured at 1.0 cm. Left ventricular ejection fraction was 60 to 65% with grade 1 diastolic dysfunction. Cardiac catheterization on 07/05/2017 showed a patent stent extending out of the right coronary ostium with about 70% narrowing. The left main was widely patent. The LAD had about 50 to 70% narrowing beyond the first diagonal branch and the first diagonal branch had about 90% stenosis. The ostium of the left circumflex had about 40 to 50% narrowing. The peak to peak gradient across aortic valve was 30 mmHg. Cardiac index was 4.23. PA pressure was 40/18 with a mean wedge pressure of 17. LVEDP was 18.   She underwent successful TAVR with 32mm Edwards Sapien THV via the TF approach on 08/02/17. Post operative echo5/22/19 showed EF 65%, normally  functioning TAVR valve;peak/mean 28/14 mmHg and mildPVL. Post op course was uncomplicated. She was discharged on ASA and plavix.   Today she presents to clinic for follow up. She has been doing much better. She can tell a difference in her walking and breathing since surgery. No CP or SOB. No LE edema, orthopnea or PND. No dizziness or syncope. No blood in stool or urine. No palpitations.   Past Medical History:  Diagnosis Date  . Aortic stenosis, severe    a. 07/2017: s/p TAVR w/ an Edwards Sapien 3 THV (size 26 mm, model # [id number], serial # [id number])  . CAD (coronary artery disease)    a. 2010: s/p stent to RCA  . Chronic kidney disease, stage III (moderate) (HCC)   . Chronic sinusitis   . Depressive disorder   . History of breast cancer    a. s/p lumpectomies and XRT  . Hyperlipemia   . Hypertension   . Hypothyroid   . Insomnia   . Memory loss   . Osteoarthritis   . Reflux esophagitis     Past Surgical History:  Procedure Laterality Date  . ANGIOPLASTY    . BREAST LUMPECTOMY     x2  . FOOT TENDON SURGERY    . GALLBLADDER SURGERY    . INTRAOPERATIVE TRANSTHORACIC ECHOCARDIOGRAM N/A 08/02/2017   Procedure: INTRAOPERATIVE TRANSTHORACIC ECHOCARDIOGRAM;  Surgeon:  Burnell Blanks, MD;  Location: [facility];  Service: Open Heart Surgery;  Laterality: N/A;  . REPLACEMENT TOTAL KNEE BILATERAL    . RIGHT/LEFT HEART CATH AND CORONARY ANGIOGRAPHY N/A 07/05/2017   Procedure: RIGHT/LEFT HEART CATH AND CORONARY ANGIOGRAPHY;  Surgeon: Belva Crome, MD;  Location: [facility];  Service: Cardiovascular;  Laterality: N/A;  . SKIN CANCER EXCISION  2018   right nostril   . TOTAL ABDOMINAL HYSTERECTOMY    . TRANSCATHETER AORTIC VALVE REPLACEMENT, TRANSFEMORAL N/A 08/02/2017   Procedure: TRANSCATHETER AORTIC VALVE REPLACEMENT, TRANSFEMORAL;  Surgeon: Burnell Blanks, MD;  Location: [facility];  Service: Open Heart Surgery;  Laterality: N/A;    Current Medications: Outpatient  Medications Prior to Visit  Medication Sig Dispense Refill  . acetaminophen (TYLENOL) 500 MG tablet Take 1,000 mg by mouth daily as needed (sinus headaches).    . ALPRAZolam (XANAX) 0.25 MG tablet Take 0.25 mg by mouth 3 (three) times daily as needed for anxiety or sleep.     Marland Kitchen amoxicillin (AMOXIL) 500 MG capsule Take 500 mg by mouth. Take 4 caps 1 hour prior to dental procedures    . aspirin EC 81 MG EC tablet Take 1 tablet (81 mg total) by mouth daily.    Marland Kitchen buPROPion (WELLBUTRIN SR) 150 MG 12 hr tablet Take 150 mg by mouth daily before breakfast.     . citalopram (CELEXA) 10 MG tablet Take 10 mg by mouth at bedtime.     . clopidogrel (PLAVIX) 75 MG tablet Take 1 tablet (75 mg total) by mouth daily with breakfast. 30 tablet 5  . eszopiclone (LUNESTA) 2 MG TABS tablet Take 1-2 mg by mouth at bedtime.     . fexofenadine (ALLEGRA) 180 MG tablet Take 180 mg by mouth daily.    Marland Kitchen HYDROcodone-acetaminophen (NORCO/VICODIN) 5-325 MG tablet Take 1-2 tablets by mouth every 6 (six) hours as needed for moderate pain.    Marland Kitchen levothyroxine (SYNTHROID, LEVOTHROID) 50 MCG tablet Take 50 mcg by mouth once daily    . metoprolol succinate (TOPROL-XL) 25 MG 24 hr tablet Take 25 mg by mouth once daily    . olmesartan-hydrochlorothiazide (BENICAR HCT) 40-12.5 MG tablet Take 1 tablet by mouth daily.     No facility-administered medications prior to visit.      Allergies:   Betadine [povidone iodine]; Latex; and Zestril [lisinopril]   Social History   Socioeconomic History  . Marital status: Widowed    Spouse name: Not on file  . Number of children: 3  . Years of education: Not on file  . Highest education level: Not on file  Occupational History  . 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 Financial resource strain: Not on file  . Food insecurity:    Worry: Not on file    Inability: Not on file  . Transportation needs:    Medical: Not on file    Non-medical: Not on  file  Tobacco Use  . Smoking status: Former Smoker    Packs/day: 0.50    Years: 20.00    Pack years: 10.00    Types: Cigarettes    Last attempt to quit: 03/15/1964    Years since quitting: 53.5  . Smokeless tobacco: Never Used  Substance and Sexual Activity  . Alcohol use: Yes    Comment: social  . Drug use: No  . Sexual activity: Not on file  Lifestyle  . Physical activity:    Days per week: Not  on file    Minutes per session: Not on file  . Stress: Not on file  Relationships  . Social connections:    Talks on phone: Not on file    Gets together: Not on file    Attends religious service: Not on file    Active member of club or organization: Not on file    Attends meetings of clubs or organizations: Not on file    Relationship status: Not on file  Other Topics Concern  . Not on file  Social History Narrative  . Not on file     Family History:  The patient's family history includes Breast cancer in her sister; Heart disease in her father and maternal grandfather; Stroke in her mother.     ROS:   Please see the history of present illness.    ROS All other systems reviewed and are negative.   PHYSICAL EXAM:   VS:  BP 126/70   Pulse 69   Ht 4\' 11"  (1.499 m)   Wt 186 lb 6.4 oz (84.6 kg)   SpO2 93%   BMI 37.65 kg/m    GEN: Well nourished, well developed, in no acute distress, obese. Appears younger than her stated age.  HEENT: normal  Neck: no JVD, carotid bruits, or masses Cardiac: RRR; no murmurs, rubs, or gallops,no edema  Respiratory:  clear to auscultation bilaterally, normal work of breathing GI: soft, nontender, nondistended, + BS MS: no deformity or atrophy  Skin: warm and dry, no rash Neuro:  Alert and Oriented x 3, Strength and sensation are intact Psych: euthymic mood, full affect    Wt Readings from Last 3 Encounters:  09/01/17 186 lb 6.4 oz (84.6 kg)  08/11/17 191 lb (86.6 kg)  08/04/17 192 lb (87.1 kg)      Studies/Labs Reviewed:   EKG:   EKG is NOTordered today.    Recent Labs: 05/19/2017: NT-Pro BNP 594 07/29/2017: ALT 15; B Natriuretic Peptide 281.2 08/03/2017: BUN 13; Creatinine, Ser 0.96; Hemoglobin 9.5; Magnesium 1.5; Platelets 214; Potassium 3.8; Sodium 133   Lipid Panel No results found for: CHOL, TRIG, HDL, CHOLHDL, VLDL, LDLCALC, LDLDIRECT  Additional studies/ records that were reviewed today include:  TAVR OPERATIVE NOTE   Date of Procedure:08/02/2017  Preoperative Diagnosis:Severe Aortic Stenosis   Postoperative Diagnosis:Same   Procedure:   Transcatheter Aortic Valve Replacement - Transfemoral Approach Edwards Sapien 3 THV (size 7mm, model # [id number], serial #[id number])  Co-Surgeons:Christopher Angelena Form, MD and Gaye Pollack, MD   Pre-operative Echo Findings: ? Severe aortic stenosis ? Normalleft ventricular systolic function  Post-operative Echo Findings: ? Noparavalvular leak ? Normalleft ventricular systolic function  _________________   Post operative echo 08/03/17: Study Conclusions - Left ventricle: The cavity size was normal. Systolic function was vigorous. The estimated ejection fraction was in the range of 65% to 70%. Wall motion was normal; there were no regional wall motion abnormalities. - Aortic valve: Mean gradient (S): 14 mm Hg. Valve area (VTI): 1.76 cm^2. Valve area (Vmax): 1.43 cm^2. Valve area (Vmean): 1.72 cm^2. - Mitral valve: Calcified annulus. Mildly thickened leaflets . There was mild regurgitation. Valve area by pressure half-time: 1.96 cm^2. - Right ventricle: Systolic function was normal. - Right atrium: The atrium was mildly dilated. - Tricuspid valve: There was moderate regurgitation. - Pulmonary arteries: Systolic pressure was moderately increased. PA peak pressure: 54 mm Hg (S). - Inferior vena cava: The vessel was normal in size. Impressions: - S/P TAVR  with a 26 mm Edwards-SAPIEN 3  valve. Normal transaortic velocities with peak/mean 28/14 mmHg and mild paravalvular leak.   ______________  2D ECHO 09/01/17 (30 days s/p TAVR) - Left ventricle: The cavity size was normal. Wall thickness was   increased in a pattern of mild LVH. Systolic function was   vigorous. The estimated ejection fraction was in the range of 65%   to 70%. Wall motion was normal; there were no regional wall   motion abnormalities. Doppler parameters are consistent with   abnormal left ventricular relaxation (grade 1 diastolic   dysfunction). - Aortic valve: A TAVR bioprosthesis 25mm Edwards Sapien THV via   the TF approach on 08/02/17 was present. There was no   regurgitation. - Mitral valve: Moderately calcified annulus. - Left atrium: The atrium was mildly dilated. - Right ventricle: The cavity size was mildly dilated. Wall   thickness was normal.    ASSESSMENT & PLAN:   Severe AS s/p TAVR: 2D ECHO today shows EF 65%, normally functioning TAVR valve with no PVL and mean gradient 8 mmHg. She has NYHA class I symptoms. SBE prophylaxis discussed; she gets Amoxil from her dentist. She can stop plavix after 6 months of therapy ( mid November). She would like to participate in cardiac rehab in [place]. I have contacted them and they are ready for her to start.  Chronic diastolic CHF: appears euvolemic. Continue Benicar HCT  HTN: BP well controlled today  Arthritis: her hands have been flaring up since quitting Voltaren. I think she would be safe to resume this once she is off DAPT in November   Medication Adjustments/Labs and Tests Ordered: Current medicines are reviewed at length with the patient today.  Concerns regarding medicines are outlined above.  Medication changes, Labs and Tests ordered today are listed in the Patient Instructions below. Patient Instructions  Medication Instructions:  Your physician recommends that you continue on your current  medications as directed. Please refer to the Current Medication list given to you today.   Labwork: None ordered  Testing/Procedures: None ordered  Follow-Up: Your physician wants you to follow-up in: 1 year with Bonney Leitz, PA with an echocardiogram. We will call you to schedule your appointments.   Any Other Special Instructions Will Be Listed Below (If Applicable).     If you need a refill on your cardiac medications before your next appointment, please call your pharmacy.      Mable Fill, PA-C  09/02/2017 11:16 AM    [facility] [place], [place], [place]  [address] Phone: [phone]; Fax: [phone]

## 2017-09-01 NOTE — Patient Instructions (Signed)
Medication Instructions:  Your physician recommends that you continue on your current medications as directed. Please refer to the Current Medication list given to you today.   Labwork: None ordered  Testing/Procedures: None ordered  Follow-Up: Your physician wants you to follow-up in: 1 year with Bonney Leitz, PA with an echocardiogram. We will call you to schedule your appointments.   Any Other Special Instructions Will Be Listed Below (If Applicable).     If you need a refill on your cardiac medications before your next appointment, please call your pharmacy.

## 2017-09-21 ENCOUNTER — Telehealth: Payer: Self-pay

## 2017-09-21 NOTE — Telephone Encounter (Signed)
Thanks

## 2017-09-21 NOTE — Telephone Encounter (Signed)
The pt's daughter Jenny Reichmann contacted the office (pt available on speaker phone) to discuss symptoms the pt continues to complain of following TAVR. The pt was evaluated by Nell Range PA-C on 6/20 but she forgot to mention soreness in her neck most likely related to right sided central line placement. The soreness is continuous and makes turning her head to the right difficult. The pt denies redness and swelling at site. The pt denies numbness or tingling in neck and right arm.  Jenny Reichmann requested that the pt be seen again in the office for her symptoms.  I have arranged an apt with Nell Range PA-C on 7/11 at 3:30. Cindy and pt agreed with plan.

## 2017-09-22 ENCOUNTER — Encounter: Payer: Self-pay | Admitting: Physician Assistant

## 2017-09-22 ENCOUNTER — Ambulatory Visit (INDEPENDENT_AMBULATORY_CARE_PROVIDER_SITE_OTHER): Payer: Medicare Other | Admitting: Physician Assistant

## 2017-09-22 VITALS — BP 174/80 | HR 75 | Ht 59.0 in | Wt 184.8 lb

## 2017-09-22 DIAGNOSIS — M542 Cervicalgia: Secondary | ICD-10-CM | POA: Diagnosis not present

## 2017-09-22 DIAGNOSIS — I2510 Atherosclerotic heart disease of native coronary artery without angina pectoris: Secondary | ICD-10-CM | POA: Diagnosis not present

## 2017-09-22 DIAGNOSIS — M1990 Unspecified osteoarthritis, unspecified site: Secondary | ICD-10-CM

## 2017-09-22 DIAGNOSIS — I10 Essential (primary) hypertension: Secondary | ICD-10-CM | POA: Diagnosis not present

## 2017-09-22 NOTE — Progress Notes (Signed)
HEART AND [place]                                       Cardiology Office Note    Date:  09/22/2017   ID:  [name], DOB 23-Jul-1925, MRN [id number]  PCP:  [name], MD  Cardiologist:   Dr. Tamala Julian / Dr. Angelena Form & Dr. Cyndia Bent (TAVR)   CC: neck pain   History of Present Illness:  IVAL BASQUEZ is a 84 y.o. female with a history of  CAD s/p PCI to RCA in 2010, CKD, HLD, HTN, hypothyroidism, breast cancer s/p lumpectomy and radiation, chronic diastolic CHF and severe ASs/p TAVR (08/02/17) who presents to clinic for follow up.   She underwentsuccessful TAVR with 36mm Edwards Sapien THV via the TF approach on 08/02/17. Post operative echo5/22/19 showed EF 65%, normally functioning TAVR valve;peak/mean 28/14 mmHg and mildPVL. Post op course was uncomplicated. She was discharged on ASA and plavix.   She has been having neck pain behind her right ear and in the back of her neck. It hurts to move her head to the right. It is described as a dull aching pain. She thought it might have something to do with the central line when she had her TAVR. She also complains of severe arthritis pain in her hands. The only thing that controls it, is her oral voltaran, which was discontinued when she was placed on ASA and plavix.    Past Medical History:  Diagnosis Date  . Aortic stenosis, severe    a. 07/2017: s/p TAVR w/ an Edwards Sapien 3 THV (size 26 mm, model # [id number], serial # [id number])  . CAD (coronary artery disease)    a. 2010: s/p stent to RCA  . Chronic kidney disease, stage III (moderate) (HCC)   . Chronic sinusitis   . Depressive disorder   . History of breast cancer    a. s/p lumpectomies and XRT  . Hyperlipemia   . Hypertension   . Hypothyroid   . Insomnia   . Memory loss   . Osteoarthritis   . Reflux esophagitis     Past Surgical History:  Procedure Laterality Date  . ANGIOPLASTY    . BREAST LUMPECTOMY     x2  .  FOOT TENDON SURGERY    . GALLBLADDER SURGERY    . INTRAOPERATIVE TRANSTHORACIC ECHOCARDIOGRAM N/A 08/02/2017   Procedure: INTRAOPERATIVE TRANSTHORACIC ECHOCARDIOGRAM;  Surgeon: Burnell Blanks, MD;  Location: [facility];  Service: Open Heart Surgery;  Laterality: N/A;  . REPLACEMENT TOTAL KNEE BILATERAL    . RIGHT/LEFT HEART CATH AND CORONARY ANGIOGRAPHY N/A 07/05/2017   Procedure: RIGHT/LEFT HEART CATH AND CORONARY ANGIOGRAPHY;  Surgeon: Belva Crome, MD;  Location: [facility];  Service: Cardiovascular;  Laterality: N/A;  . SKIN CANCER EXCISION  2018   right nostril   . TOTAL ABDOMINAL HYSTERECTOMY    . TRANSCATHETER AORTIC VALVE REPLACEMENT, TRANSFEMORAL N/A 08/02/2017   Procedure: TRANSCATHETER AORTIC VALVE REPLACEMENT, TRANSFEMORAL;  Surgeon: Burnell Blanks, MD;  Location: [facility];  Service: Open Heart Surgery;  Laterality: N/A;    Current Medications: Outpatient Medications Prior to Visit  Medication Sig Dispense Refill  . acetaminophen (TYLENOL) 500 MG tablet Take 1,000 mg by mouth daily as needed (sinus headaches).    . ALPRAZolam (XANAX) 0.25 MG tablet Take 0.25 mg by mouth  3 (three) times daily as needed for anxiety or sleep.     Marland Kitchen amoxicillin (AMOXIL) 500 MG capsule Take 500 mg by mouth. Take 4 caps 1 hour prior to dental procedures    . aspirin EC 81 MG EC tablet Take 1 tablet (81 mg total) by mouth daily.    Marland Kitchen buPROPion (WELLBUTRIN SR) 150 MG 12 hr tablet Take 150 mg by mouth daily before breakfast.     . citalopram (CELEXA) 10 MG tablet Take 10 mg by mouth at bedtime.     . clopidogrel (PLAVIX) 75 MG tablet Take 1 tablet (75 mg total) by mouth daily with breakfast. 30 tablet 5  . eszopiclone (LUNESTA) 2 MG TABS tablet Take 1-2 mg by mouth at bedtime.     . fexofenadine (ALLEGRA) 180 MG tablet Take 180 mg by mouth daily.    Marland Kitchen HYDROcodone-acetaminophen (NORCO/VICODIN) 5-325 MG tablet Take 1-2 tablets by mouth every 6 (six) hours as needed for moderate pain.    Marland Kitchen  levothyroxine (SYNTHROID, LEVOTHROID) 50 MCG tablet Take 50 mcg by mouth once daily    . metoprolol succinate (TOPROL-XL) 25 MG 24 hr tablet Take 25 mg by mouth once daily    . olmesartan-hydrochlorothiazide (BENICAR HCT) 40-12.5 MG tablet Take 1 tablet by mouth daily.     No facility-administered medications prior to visit.      Allergies:   Betadine [povidone iodine]; Latex; and Zestril [lisinopril]   Social History   Socioeconomic History  . Marital status: Widowed    Spouse name: Not on file  . Number of children: 3  . Years of education: Not on file  . Highest education level: Not on file  Occupational History  . 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 Financial resource strain: Not on file  . Food insecurity:    Worry: Not on file    Inability: Not on file  . Transportation needs:    Medical: Not on file    Non-medical: Not on file  Tobacco Use  . Smoking status: Former Smoker    Packs/day: 0.50    Years: 20.00    Pack years: 10.00    Types: Cigarettes    Last attempt to quit: 03/15/1964    Years since quitting: 53.5  . Smokeless tobacco: Never Used  Substance and Sexual Activity  . Alcohol use: Yes    Comment: social  . Drug use: No  . Sexual activity: Not on file  Lifestyle  . Physical activity:    Days per week: Not on file    Minutes per session: Not on file  . Stress: Not on file  Relationships  . Social connections:    Talks on phone: Not on file    Gets together: Not on file    Attends religious service: Not on file    Active member of club or organization: Not on file    Attends meetings of clubs or organizations: Not on file    Relationship status: Not on file  Other Topics Concern  . Not on file  Social History Narrative  . Not on file     Family History:  The patient's family history includes Breast cancer in her sister; Heart disease in her father and maternal grandfather; Stroke in her mother.       ROS:   Please see the history of present illness.    ROS All other systems reviewed and are negative.   PHYSICAL EXAM:   VS:  BP (!) 174/80   Pulse 75   Ht 4\' 11"  (1.499 m)   Wt 184 lb 12.8 oz (83.8 kg)   BMI 37.33 kg/m    GEN: Well nourished, well developed, in no acute distress, obese, appears younger than stated age 85: normal  Neck: no JVD, carotid bruits, or masses Cardiac: RRR; no murmurs, rubs, or gallops,no edema  Respiratory:  clear to auscultation bilaterally, normal work of breathing GI: soft, nontender, nondistended, + BS MS: no deformity or atrophy  Skin: warm and dry, no rash Neuro:  Alert and Oriented x 3, Strength and sensation are intact Psych: euthymic mood, full affect   Wt Readings from Last 3 Encounters:  09/22/17 184 lb 12.8 oz (83.8 kg)  09/01/17 186 lb 6.4 oz (84.6 kg)  08/11/17 191 lb (86.6 kg)      Studies/Labs Reviewed:   EKG:  EKG is NOT ordered today.   Recent Labs: 05/19/2017: NT-Pro BNP 594 07/29/2017: ALT 15; B Natriuretic Peptide 281.2 08/03/2017: BUN 13; Creatinine, Ser 0.96; Hemoglobin 9.5; Magnesium 1.5; Platelets 214; Potassium 3.8; Sodium 133   Lipid Panel No results found for: CHOL, TRIG, HDL, CHOLHDL, VLDL, LDLCALC, LDLDIRECT  Additional studies/ records that were reviewed today include:  none   ASSESSMENT & PLAN:   Neck pain: the area of pain is behind her ear and in the back of her neck.  It is not anywhere near where her central line was placed. Neck exam is grossly normal. Sounds musculoskeletal. I have encouraged supportive care with tyelnol and compresses.   HTN: BP initially very elevated. She admitted to rushing around to get here and feeling very stressed. It was 130/70 on my personal recheck. She says it runs normal at home.  Arthritis pain: she has severe arthritis pain in her hands that has been debilitating. She would like to go back on her PO Voltaran. She understands the reasoning as to why we have asked  her to not take it while on ASA and plavix. She wonders if she could come off the plavix so she could safely resume her anti inflammatories. She understands that we recommend 6 months of DAPT after TAVR but is willing to take the risk of stopping it early If it means she will be out of pain. I told her I would discuss with Dr. Angelena Form.   Medication Adjustments/Labs and Tests Ordered: Current medicines are reviewed at length with the patient today.  Concerns regarding medicines are outlined above.  Medication changes, Labs and Tests ordered today are listed in the Patient Instructions below. Patient Instructions  Nell Range will call you to discuss your medications and to give you an allergist's name.    Signed, Angelena Form, PA-C  09/22/2017 8:24 PM    [facility] [place], [place], [place]  [address] Phone: [phone]; Fax: [phone]

## 2017-09-22 NOTE — Patient Instructions (Signed)
[name] will call you to discuss your medications and to give you an allergist's name.

## 2017-09-23 ENCOUNTER — Other Ambulatory Visit: Payer: Self-pay | Admitting: Physician Assistant

## 2017-09-23 ENCOUNTER — Telehealth: Payer: Self-pay | Admitting: Physician Assistant

## 2017-09-23 MED ORDER — DICLOFENAC SODIUM 25 MG PO TBEC: 75.0000 mg | DELAYED_RELEASE_TABLET | Freq: Two times a day (BID) | ORAL | Status: DC

## 2017-09-23 NOTE — Telephone Encounter (Signed)
  [facility]   MULTIDISCIPLINARY HEART VALVE TEAM   Discussed case with Dr. Angelena Form. He agrees that quality of life is most important for [name]. We will plan to stop plavix early and she can restart her PO Voltaran to help the arthritis in her hands. Her creat is in the normal range and GFR 57.  I have called this into her pharmacy. I will forward to PCP.   Angelena Form PA-C  [facility]

## 2017-09-26 ENCOUNTER — Telehealth: Payer: Self-pay | Admitting: Physician Assistant

## 2017-09-26 NOTE — Telephone Encounter (Signed)
F/u      *STAT* If patient is at the pharmacy, call can be transferred to refill team.   1. Which medications need to be refilled? (please list name of each medication and dose if known) Valtren 75mg   2. Which pharmacy/location (including street and city if local pharmacy) is medication to be sent to?[facility]  3. Do they need a 30 day or 90 day supply? 30          Per patient pharmacy does not have new Rx

## 2017-09-26 NOTE — Telephone Encounter (Signed)
Pt is requesting a refill on Voltaren 75 mg tablet. Would Dr. Angelena Form like to refill this medication? Please address

## 2017-09-26 NOTE — Telephone Encounter (Signed)
This was prescribed by TAVR team.

## 2017-09-26 NOTE — Telephone Encounter (Signed)
New Message    Patient is inquiring about a referral for a ENT.

## 2017-09-26 NOTE — Telephone Encounter (Signed)
I have called and left a message with Jenny Reichmann about ENT doctors

## 2017-09-26 NOTE — Telephone Encounter (Signed)
Pt is requesting a refill on voltaren. Would Dr. Tamala Julian like to refill this medication? Please address

## 2017-09-26 NOTE — Telephone Encounter (Signed)
New Message     *STAT* If patient is at the pharmacy, call can be transferred to refill team.   1. Which medications need to be refilled? (please list name of each medication and dose if known) diclofenac (VOLTAREN) 75 MG EC tablet  2. Which pharmacy/location (including street and city if local pharmacy) is medication to be sent to? [place]  3. Do they need a 30 day or 90 day supply? [place]

## 2017-09-26 NOTE — Telephone Encounter (Signed)
I called it into her pharmacy. Future refills will need to go through her PCP. Thanks

## 2017-09-28 ENCOUNTER — Telehealth: Payer: Self-pay | Admitting: *Deleted

## 2017-09-28 ENCOUNTER — Other Ambulatory Visit: Payer: Self-pay | Admitting: Physician Assistant

## 2017-09-28 NOTE — Telephone Encounter (Signed)
Spoke with daughter and made her aware.

## 2017-09-28 NOTE — Telephone Encounter (Signed)
-----   Message from Eileen Stanford, PA-C sent at 09/28/2017  8:37 AM EDT ----- Can you call her daughter, Jenny Reichmann, and let her know I had Rx'd her medication Volatan 75mg  BID to the [facility] on 7/12 but for some reason it did not go through the computer. I called there today and verbally called it in, so they will definitely have it. Not sure why it didn't go through the computer. Thanks!!  KT

## 2017-10-05 NOTE — Telephone Encounter (Signed)
See 09/28/17 phone note

## 2017-10-17 DIAGNOSIS — D6489 Other specified anemias: Secondary | ICD-10-CM

## 2017-10-17 DIAGNOSIS — C50912 Malignant neoplasm of unspecified site of left female breast: Secondary | ICD-10-CM | POA: Diagnosis not present

## 2017-10-17 DIAGNOSIS — M8580 Other specified disorders of bone density and structure, unspecified site: Secondary | ICD-10-CM

## 2017-10-17 DIAGNOSIS — Z170 Estrogen receptor positive status [ER+]: Secondary | ICD-10-CM

## 2017-10-17 DIAGNOSIS — Z86000 Personal history of in-situ neoplasm of breast: Secondary | ICD-10-CM

## 2017-10-17 DIAGNOSIS — Z853 Personal history of malignant neoplasm of breast: Secondary | ICD-10-CM

## 2017-10-17 DIAGNOSIS — C773 Secondary and unspecified malignant neoplasm of axilla and upper limb lymph nodes: Secondary | ICD-10-CM | POA: Diagnosis not present

## 2017-10-17 DIAGNOSIS — Z923 Personal history of irradiation: Secondary | ICD-10-CM

## 2017-10-17 DIAGNOSIS — K439 Ventral hernia without obstruction or gangrene: Secondary | ICD-10-CM | POA: Diagnosis not present

## 2017-10-17 DIAGNOSIS — N189 Chronic kidney disease, unspecified: Secondary | ICD-10-CM

## 2017-10-26 ENCOUNTER — Encounter: Payer: Self-pay | Admitting: Thoracic Surgery (Cardiothoracic Vascular Surgery)

## 2017-10-31 ENCOUNTER — Other Ambulatory Visit: Payer: Self-pay | Admitting: Physician Assistant

## 2017-11-22 ENCOUNTER — Ambulatory Visit: Payer: Medicare Other | Admitting: Podiatry

## 2017-11-24 ENCOUNTER — Encounter: Payer: Self-pay | Admitting: Podiatry

## 2017-11-24 ENCOUNTER — Ambulatory Visit (INDEPENDENT_AMBULATORY_CARE_PROVIDER_SITE_OTHER): Payer: Medicare Other | Admitting: Podiatry

## 2017-11-24 DIAGNOSIS — I2510 Atherosclerotic heart disease of native coronary artery without angina pectoris: Secondary | ICD-10-CM | POA: Diagnosis not present

## 2017-11-24 DIAGNOSIS — Q828 Other specified congenital malformations of skin: Secondary | ICD-10-CM

## 2017-11-24 DIAGNOSIS — M2041 Other hammer toe(s) (acquired), right foot: Secondary | ICD-10-CM

## 2017-11-24 NOTE — Progress Notes (Signed)
She presents today states that the third toe right the tip of it is callused and the second toe overrides it and is painful.  Objective: Vital signs are stable alert and oriented x3.  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  Second toe overlies rides the third toe which is resulting in excessive pressure of the distal aspect of the third toe.  No open lesions or wounds are noted and strong palpable pulses are noted.  Assessment: Hammertoe deformity of distal clavus.  Plan: Debrided the distal clavus today placed silicone padding discussed the possible need for flexor tenotomy.

## 2017-12-27 NOTE — Progress Notes (Signed)
Cardiology Office Note:    Date:  12/28/2017   ID:  [name], DOB 1925-07-13, MRN [id number]  PCP:  [name], MD  Cardiologist:  [name], MD   Referring MD: [name], MD   Chief Complaint  Patient presents with  . Shortness of Breath  . Loss of Consciousness  . Cardiac Valve Problem    History of Present Illness:    [name] is a 84 y.o. female with a hx of CAD s/p PCI to RCA in 2010, CKD, HLD, HTN, hypothyroidism, breast cancer s/p lumpectomy and radiation, chronic diastolic CHF and severe ASs/p TAVR (08/02/17) who presents to clinic for follow up.   She is doing okay.  Still has dyspnea on exertion but is improved since TAVR.  No syncope or angina.  She thought having the TAVR valve would make her feel "84 years of age again".  She denies orthopnea, PND, and lower extremity edema    Past Medical History:  Diagnosis Date  . Aortic stenosis, severe    a. 07/2017: s/p TAVR w/ an Edwards Sapien 3 THV (size 26 mm, model # [id number], serial # [id number])  . CAD (coronary artery disease)    a. 2010: s/p stent to RCA  . Chronic kidney disease, stage III (moderate) (HCC)   . Chronic sinusitis   . Depressive disorder   . History of breast cancer    a. s/p lumpectomies and XRT  . Hyperlipemia   . Hypertension   . Hypothyroid   . Insomnia   . Memory loss   . Osteoarthritis   . Reflux esophagitis     Past Surgical History:  Procedure Laterality Date  . ANGIOPLASTY    . BREAST LUMPECTOMY     x2  . FOOT TENDON SURGERY    . GALLBLADDER SURGERY    . INTRAOPERATIVE TRANSTHORACIC ECHOCARDIOGRAM N/A 08/02/2017   Procedure: INTRAOPERATIVE TRANSTHORACIC ECHOCARDIOGRAM;  Surgeon: Burnell Blanks, MD;  Location: [facility];  Service: Open Heart Surgery;  Laterality: N/A;  . REPLACEMENT TOTAL KNEE BILATERAL    . RIGHT/LEFT HEART CATH AND CORONARY ANGIOGRAPHY N/A 07/05/2017   Procedure: RIGHT/LEFT HEART CATH AND CORONARY ANGIOGRAPHY;  Surgeon:  Belva Crome, MD;  Location: [facility];  Service: Cardiovascular;  Laterality: N/A;  . SKIN CANCER EXCISION  2018   right nostril   . TOTAL ABDOMINAL HYSTERECTOMY    . TRANSCATHETER AORTIC VALVE REPLACEMENT, TRANSFEMORAL N/A 08/02/2017   Procedure: TRANSCATHETER AORTIC VALVE REPLACEMENT, TRANSFEMORAL;  Surgeon: Burnell Blanks, MD;  Location: [facility];  Service: Open Heart Surgery;  Laterality: N/A;    Current Medications: Current Meds  Medication Sig  . acetaminophen (TYLENOL) 500 MG tablet Take 1,000 mg by mouth daily as needed (sinus headaches).  . ALPRAZolam (XANAX) 0.25 MG tablet Take 0.25 mg by mouth 3 (three) times daily as needed for anxiety or sleep.   Marland Kitchen amoxicillin (AMOXIL) 500 MG capsule Take 4 capsules by mouth as needed 1 hour prior to dental procedures  . aspirin EC 81 MG EC tablet Take 1 tablet (81 mg total) by mouth daily.  Marland Kitchen buPROPion (WELLBUTRIN SR) 150 MG 12 hr tablet Take 150 mg by mouth daily before breakfast.   . citalopram (CELEXA) 10 MG tablet Take 10 mg by mouth at bedtime.   . diclofenac (VOLTAREN) 75 MG EC tablet Take 75 mg by mouth 2 (two) times daily.  . eszopiclone (LUNESTA) 2 MG TABS tablet Take 1-2 mg by  mouth at bedtime.   Marland Kitchen HYDROcodone-acetaminophen (NORCO/VICODIN) 5-325 MG tablet Take 1-2 tablets by mouth every 6 (six) hours as needed for moderate pain.  Marland Kitchen levothyroxine (SYNTHROID, LEVOTHROID) 50 MCG tablet Take 50 mcg by mouth once daily  . metoprolol succinate (TOPROL-XL) 25 MG 24 hr tablet Take 25 mg by mouth once daily  . olmesartan-hydrochlorothiazide (BENICAR HCT) 40-12.5 MG tablet Take 1 tablet by mouth daily.     Allergies:   Betadine [povidone iodine]; Latex; and Zestril [lisinopril]   Social History   Socioeconomic History  . Marital status: Widowed    Spouse name: Not on file  . Number of children: 3  . Years of education: Not on file  . Highest education level: Not on file  Occupational History  . 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 Financial resource strain: Not on file  . Food insecurity:    Worry: Not on file    Inability: Not on file  . Transportation needs:    Medical: Not on file    Non-medical: Not on file  Tobacco Use  . Smoking status: Former Smoker    Packs/day: 0.50    Years: 20.00    Pack years: 10.00    Types: Cigarettes    Last attempt to quit: 03/15/1964    Years since quitting: 53.8  . Smokeless tobacco: Never Used  Substance and Sexual Activity  . Alcohol use: Yes    Comment: social  . Drug use: No  . Sexual activity: Not on file  Lifestyle  . Physical activity:    Days per week: Not on file    Minutes per session: Not on file  . Stress: Not on file  Relationships  . Social connections:    Talks on phone: Not on file    Gets together: Not on file    Attends religious service: Not on file    Active member of club or organization: Not on file    Attends meetings of clubs or organizations: Not on file    Relationship status: Not on file  Other Topics Concern  . Not on file  Social History Narrative  . Not on file     Family History: The patient's family history includes Breast cancer in her sister; Heart disease in her father and maternal grandfather; Stroke in her mother.  ROS:   Please see the history of present illness.    Sinus drainage, chronic headaches.  Enjoying cardiac rehab.  All other systems reviewed and are negative.  EKGs/Labs/Other Studies Reviewed:    The following studies were reviewed today: None  EKG:  EKG is  ordered today.  The ekg is not ordered.  Recent Labs: 05/19/2017: NT-Pro BNP 594 07/29/2017: ALT 15; B Natriuretic Peptide 281.2 08/03/2017: BUN 13; Creatinine, Ser 0.96; Hemoglobin 9.5; Magnesium 1.5; Platelets 214; Potassium 3.8; Sodium 133  Recent Lipid Panel No results found for: CHOL, TRIG, HDL, CHOLHDL, VLDL, LDLCALC, LDLDIRECT  Physical Exam:    VS:  BP (!) 192/86   Pulse 65    Ht 4\' 11"  (1.499 m)   Wt 188 lb (85.3 kg)   BMI 37.97 kg/m     Wt Readings from Last 3 Encounters:  12/28/17 188 lb (85.3 kg)  09/22/17 184 lb 12.8 oz (83.8 kg)  09/01/17 186 lb 6.4 oz (84.6 kg)     GEN:  Well nourished, well developed in no acute distress HEENT: Normal NECK: No JVD. LYMPHATICS: No lymphadenopathy CARDIAC: RRR, no  murmur, no gallop, no edema. VASCULAR: 2+ bilateral radial and carotid pulses.  No bruits. RESPIRATORY:  Clear to auscultation without rales, wheezing or rhonchi  ABDOMEN: Soft, non-tender, non-distended, No pulsatile mass, MUSCULOSKELETAL: No deformity  SKIN: Warm and dry NEUROLOGIC:  Alert and oriented x 3 PSYCHIATRIC:  Normal affect   ASSESSMENT:    1. S/P TAVR (transcatheter aortic valve replacement)   2. Coronary artery disease involving native coronary artery of native heart without angina pectoris   3. Essential hypertension, benign   4. Mixed hyperlipidemia    PLAN:    In order of problems listed above:  1. Stable without auscultatory evidence of dysfunction 2. Asymptomatic 3. Significant blood pressure elevation.  They will track this at both rehab and at home and if it remains above 150/90 additional therapy will be required. 4. Target LDL less than 70.  Clinical follow-up in 1 year.   Medication Adjustments/Labs and Tests Ordered: Current medicines are reviewed at length with the patient today.  Concerns regarding medicines are outlined above.  No orders of the defined types were placed in this encounter.  No orders of the defined types were placed in this encounter.   There are no Patient Instructions on file for this visit.   Signed, [name], MD  12/28/2017 10:00 AM    [place]

## 2017-12-28 ENCOUNTER — Encounter: Payer: Self-pay | Admitting: Interventional Cardiology

## 2017-12-28 ENCOUNTER — Ambulatory Visit (INDEPENDENT_AMBULATORY_CARE_PROVIDER_SITE_OTHER): Payer: Medicare Other | Admitting: Interventional Cardiology

## 2017-12-28 VITALS — BP 192/86 | HR 65 | Ht 59.0 in | Wt 188.0 lb

## 2017-12-28 DIAGNOSIS — I10 Essential (primary) hypertension: Secondary | ICD-10-CM | POA: Diagnosis not present

## 2017-12-28 DIAGNOSIS — E782 Mixed hyperlipidemia: Secondary | ICD-10-CM | POA: Diagnosis not present

## 2017-12-28 DIAGNOSIS — I2510 Atherosclerotic heart disease of native coronary artery without angina pectoris: Secondary | ICD-10-CM | POA: Diagnosis not present

## 2017-12-28 DIAGNOSIS — Z952 Presence of prosthetic heart valve: Secondary | ICD-10-CM

## 2017-12-28 NOTE — Patient Instructions (Signed)
Your physician recommends that you continue on your current medications as directed. Please refer to the Current Medication list given to you today.   Your physician wants you to follow-up in: YEAR WITH DR SMITH  You will receive a reminder letter in the mail two months in advance. If you don't receive a letter, please call our office to schedule the follow-up appointment.  

## 2018-02-13 DIAGNOSIS — M8580 Other specified disorders of bone density and structure, unspecified site: Secondary | ICD-10-CM

## 2018-02-13 DIAGNOSIS — Z170 Estrogen receptor positive status [ER+]: Secondary | ICD-10-CM

## 2018-02-13 DIAGNOSIS — Z86000 Personal history of in-situ neoplasm of breast: Secondary | ICD-10-CM

## 2018-02-13 DIAGNOSIS — Z853 Personal history of malignant neoplasm of breast: Secondary | ICD-10-CM

## 2018-02-13 DIAGNOSIS — C50912 Malignant neoplasm of unspecified site of left female breast: Secondary | ICD-10-CM | POA: Diagnosis not present

## 2018-02-13 DIAGNOSIS — K439 Ventral hernia without obstruction or gangrene: Secondary | ICD-10-CM | POA: Diagnosis not present

## 2018-02-13 DIAGNOSIS — Z923 Personal history of irradiation: Secondary | ICD-10-CM

## 2018-02-13 DIAGNOSIS — C773 Secondary and unspecified malignant neoplasm of axilla and upper limb lymph nodes: Secondary | ICD-10-CM | POA: Diagnosis not present

## 2018-05-22 DIAGNOSIS — C50912 Malignant neoplasm of unspecified site of left female breast: Secondary | ICD-10-CM

## 2018-05-22 DIAGNOSIS — Z170 Estrogen receptor positive status [ER+]: Secondary | ICD-10-CM

## 2018-05-22 DIAGNOSIS — C773 Secondary and unspecified malignant neoplasm of axilla and upper limb lymph nodes: Secondary | ICD-10-CM | POA: Diagnosis not present

## 2018-07-06 ENCOUNTER — Telehealth: Payer: Self-pay | Admitting: Physician Assistant

## 2018-07-06 NOTE — Telephone Encounter (Signed)
Tried to call [name] to set up appt with Nell Range for virtual appt - no answer no vm

## 2018-07-07 NOTE — Telephone Encounter (Signed)
Tried to call patient back to set up virtual appt with K thompson - no answer , no vm , line drops after 4 rings

## 2018-07-11 ENCOUNTER — Telehealth: Payer: Self-pay | Admitting: Physician Assistant

## 2018-07-11 NOTE — Telephone Encounter (Signed)
New Message    Pts daughter is returning call    Please call back

## 2018-07-11 NOTE — Telephone Encounter (Signed)
LMOM FOR DAUGHTER CINDY WALKER TO CALL BACK ABOUT SETTING UP A VIRTUAL APPT WITH KATIE THOMPSON, NEED TO GET CONSENT FOR VIRTUAL APPT AS WELL   TRIED BOTH HOME AND CELL FOR PT AS WELL NO ANSWER , NO VM

## 2018-07-12 NOTE — Telephone Encounter (Signed)
Thank you jean! Can you call you get an echo set up for early July ? 1 year s/p TAVR. Thanks!

## 2018-07-12 NOTE — Telephone Encounter (Signed)
Patient set up for MyChart? Daughter declined  Is patient using Smartphone/computer/tablet? Patient does not have access to any  Did audio/video work?  Does patient need telephone visit? Yes   Best phone number to use? [facility] Instructions? Daughter will take a scale and bp cuff to pt house morning of appt, she will have medication list ready as well     Virtual Visit Pre-Appointment Phone Call  "(Name), I am calling you today to discuss your upcoming appointment. We are currently trying to limit exposure to the virus that causes COVID-19 by seeing patients at home rather than in the office."  1. "What is the BEST phone number to call the day of the visit?" - include this in appointment notes  2. Do you have or have access to (through a family member/friend) a smartphone with video capability that we can use for your visit?" a. If yes - list this number in appt notes as cell (if different from BEST phone #) and list the appointment type as a VIDEO visit in appointment notes b. If no - list the appointment type as a PHONE visit in appointment notes  3. Confirm consent - "In the setting of the current Covid19 crisis, you are scheduled for a (phone or video) visit with your provider on (date) at (time).  Just as we do with many in-office visits, in order for you to participate in this visit, we must obtain consent.  If you'd like, I can send this to your mychart (if signed up) or email for you to review.  Otherwise, I can obtain your verbal consent now.  All virtual visits are billed to your insurance company just like a normal visit would be.  By agreeing to a virtual visit, we'd like you to understand that the technology does not allow for your provider to perform an examination, and thus may limit your provider's ability to fully assess your condition. If your provider identifies any concerns that need to be evaluated in person, we will make arrangements to do so.  Finally,  though the technology is pretty good, we cannot assure that it will always work on either your or our end, and in the setting of a video visit, we may have to convert it to a phone-only visit.  In either situation, we cannot ensure that we have a secure connection.  Are you willing to proceed?" STAFF: Did the patient verbally acknowledge consent to telehealth visit? Document YES/NO here: yes per daughter [name]   4. Advise patient to be prepared - "Two hours prior to your appointment, go ahead and check your blood pressure, pulse, oxygen saturation, and your weight (if you have the equipment to check those) and write them all down. When your visit starts, your provider will ask you for this information. If you have an Apple Watch or Kardia device, please plan to have heart rate information ready on the day of your appointment. Please have a pen and paper handy nearby the day of the visit as well."  5. Give patient instructions for MyChart download to smartphone OR Doximity/Doxy.me as below if video visit (depending on what platform provider is using)  6. Inform patient they will receive a phone call 15 minutes prior to their appointment time (may be from unknown caller ID) so they should be prepared to answer    TELEPHONE CALL NOTE  [name] has been deemed a candidate for a follow-up tele-health visit to limit community exposure  during the Covid-19 pandemic. I spoke with the patient via phone to ensure availability of phone/video source, confirm preferred email & phone number, and discuss instructions and expectations.  I reminded [name] to be prepared with any vital sign and/or heart rhythm information that could potentially be obtained via home monitoring, at the time of her visit. I reminded [name] to expect a phone call prior to her visit.  [name] 07/12/2018 11:17 AM   INSTRUCTIONS FOR DOWNLOADING THE MYCHART APP TO SMARTPHONE  - The patient must first make  sure to have activated MyChart and know their login information - If Apple, go to [employer] and type in MyChart in the search bar and download the app. If Android, ask patient to go to [employer] and type in [place] in the search bar and download the app. The app is free but as with any other app downloads, their phone may require them to verify saved payment information or Apple/Android password.  - The patient will need to then log into the app with their MyChart username and password, and select [facility] as their healthcare provider to link the account. When it is time for your visit, go to the MyChart app, find appointments, and click Begin Video Visit. Be sure to Select Allow for your device to access the Microphone and Camera for your visit. You will then be connected, and your provider will be with you shortly.  **If they have any issues connecting, or need assistance please contact MyChart service desk [phone])**  **If using a computer, in order to ensure the best quality for their visit they will need to use either of the following Internet Browsers: [employer], or Google Chrome**  IF USING DOXIMITY or DOXY.ME - The patient will receive a link just prior to their visit by text.     FULL LENGTH CONSENT FOR TELE-HEALTH VISIT   I hereby voluntarily request, consent and authorize [facility] and its employed or contracted physicians, physician assistants, nurse practitioners or other licensed health care professionals (the Practitioner), to provide me with telemedicine health care services (the Services") as deemed necessary by the treating Practitioner. I acknowledge and consent to receive the Services by the Practitioner via telemedicine. I understand that the telemedicine visit will involve communicating with the Practitioner through live audiovisual communication technology and the disclosure of certain medical information by electronic transmission. I  acknowledge that I have been given the opportunity to request an in-person assessment or other available alternative prior to the telemedicine visit and am voluntarily participating in the telemedicine visit.  I understand that I have the right to withhold or withdraw my consent to the use of telemedicine in the course of my care at any time, without affecting my right to future care or treatment, and that the Practitioner or I may terminate the telemedicine visit at any time. I understand that I have the right to inspect all information obtained and/or recorded in the course of the telemedicine visit and may receive copies of available information for a reasonable fee.  I understand that some of the potential risks of receiving the Services via telemedicine include:   Delay or interruption in medical evaluation due to technological equipment failure or disruption;  Information transmitted may not be sufficient (e.g. poor resolution of images) to allow for appropriate medical decision making by the Practitioner; and/or   In rare instances, security protocols could fail, causing a breach of personal health  information.  Furthermore, I acknowledge that it is my responsibility to provide information about my medical history, conditions and care that is complete and accurate to the best of my ability. I acknowledge that Practitioner's advice, recommendations, and/or decision may be based on factors not within their control, such as incomplete or inaccurate data provided by me or distortions of diagnostic images or specimens that may result from electronic transmissions. I understand that the practice of medicine is not an exact science and that Practitioner makes no warranties or guarantees regarding treatment outcomes. I acknowledge that I will receive a copy of this consent concurrently upon execution via email to the email address I last provided but may also request a printed copy by calling the office of  [facility].    I understand that my insurance will be billed for this visit.   I have read or had this consent read to me.  I understand the contents of this consent, which adequately explains the benefits and risks of the Services being provided via telemedicine.   I have been provided ample opportunity to ask questions regarding this consent and the Services and have had my questions answered to my satisfaction.  I give my informed consent for the services to be provided through the use of telemedicine in my medical care  By participating in this telemedicine visit I agree to the above.

## 2018-07-14 ENCOUNTER — Other Ambulatory Visit: Payer: Self-pay

## 2018-07-14 ENCOUNTER — Other Ambulatory Visit: Payer: Self-pay | Admitting: Physician Assistant

## 2018-07-14 ENCOUNTER — Telehealth (INDEPENDENT_AMBULATORY_CARE_PROVIDER_SITE_OTHER): Payer: Medicare Other | Admitting: Physician Assistant

## 2018-07-14 VITALS — BP 160/78 | HR 80 | Wt 182.0 lb

## 2018-07-14 DIAGNOSIS — Z952 Presence of prosthetic heart valve: Secondary | ICD-10-CM

## 2018-07-14 DIAGNOSIS — Z7189 Other specified counseling: Secondary | ICD-10-CM

## 2018-07-14 DIAGNOSIS — I10 Essential (primary) hypertension: Secondary | ICD-10-CM

## 2018-07-14 DIAGNOSIS — I5032 Chronic diastolic (congestive) heart failure: Secondary | ICD-10-CM

## 2018-07-14 NOTE — Progress Notes (Signed)
[facility]   MULTIDISCIPLINARY HEART VALVE TEAM     Virtual Visit via Telephone Note   This visit type was conducted due to national recommendations for restrictions regarding the COVID-19 Pandemic (e.g. social distancing) in an effort to limit this patient's exposure and mitigate transmission in our community.  Due to her co-morbid illnesses, this patient is at least at moderate risk for complications without adequate follow up.  This format is felt to be most appropriate for this patient at this time.  The patient did not have access to video technology/had technical difficulties with video requiring transitioning to audio format only (telephone).  All issues noted in this document were discussed and addressed.  No physical exam could be performed with this format.  Please refer to the patient's chart for her  consent to telehealth for [facility]   Evaluation Performed:  Follow-up visit  Date:  07/14/2018   ID:  [name], DOB [birth year], MRN [id number]  Patient Location: Home Provider Location: Office  PCP:  [name], MD  Cardiologist:  [name], MD / Dr. Angelena Form & Dr. Cyndia Bent (TAVR) Electrophysiologist:  None   Chief Complaint:  1 year s/p TAVR  History of Present Illness:    ROSIO WEISS is a 85 y.o. female with CAD s/p PCI to RCA in 2010, CKD, HLD, HTN, hypothyroidism, breast cancer s/p lumpectomy and radiation, chronic diastolic CHF and severe ASs/p TAVR (08/02/17) who presents for follow up.   The patient does not have symptoms concerning for COVID-19 infection (fever, chills, cough, or new shortness of breath).   She was diagnosed with severe AS last years. Pre TAVR cath 07/05/2017 showed a patent stent extending out of the right coronary ostium with about 70% narrowing. The left main was widely patent. The LAD had about 50 to 70% narrowing beyond the first diagonal branch and the first diagonal branch had about 90% stenosis. The ostium  of the left circumflex had about 40 to 50% narrowing.   She underwentsuccessful TAVR with 16mm Edwards Sapien THV via the TF approach on 08/02/17. Post operative echo5/22/19 showed EF 65%, normally functioning TAVR valve;peak/mean 28/14 mmHg and mildPVL. Post op course was uncomplicated. She was discharged on ASA and plavix. Plavix was later discontinued so she could safely take her Voltaran for her arthritis pain.   Today she presents for a telehealth visit for follow up. No CP or SOB. No LE edema, orthopnea or PND. No dizziness or syncope. No blood in stool or urine. No palpitations. She is mostly limited by gait imbalance and weakness in her legs.     Past Medical History:  Diagnosis Date  . Aortic stenosis, severe    a. 07/2017: s/p TAVR w/ an Edwards Sapien 3 THV (size 26 mm, model # [id number], serial # [id number])  . CAD (coronary artery disease)    a. 2010: s/p stent to RCA  . Chronic kidney disease, stage III (moderate) (HCC)   . Chronic sinusitis   . Depressive disorder   . History of breast cancer    a. s/p lumpectomies and XRT  . Hyperlipemia   . Hypertension   . Hypothyroid   . Insomnia   . Memory loss   . Osteoarthritis   . Reflux esophagitis    Past Surgical History:  Procedure Laterality Date  . ANGIOPLASTY    . BREAST LUMPECTOMY     x2  . FOOT TENDON SURGERY    . GALLBLADDER SURGERY    .  INTRAOPERATIVE TRANSTHORACIC ECHOCARDIOGRAM N/A 08/02/2017   Procedure: INTRAOPERATIVE TRANSTHORACIC ECHOCARDIOGRAM;  Surgeon: Burnell Blanks, MD;  Location: [place];  Service: Open Heart Surgery;  Laterality: N/A;  . REPLACEMENT TOTAL KNEE BILATERAL    . RIGHT/LEFT HEART CATH AND CORONARY ANGIOGRAPHY N/A 07/05/2017   Procedure: RIGHT/LEFT HEART CATH AND CORONARY ANGIOGRAPHY;  Surgeon: Belva Crome, MD;  Location: [place] [facility];  Service: Cardiovascular;  Laterality: N/A;  . SKIN CANCER EXCISION  2018   right nostril   . TOTAL ABDOMINAL HYSTERECTOMY    .  TRANSCATHETER AORTIC VALVE REPLACEMENT, TRANSFEMORAL N/A 08/02/2017   Procedure: TRANSCATHETER AORTIC VALVE REPLACEMENT, TRANSFEMORAL;  Surgeon: Burnell Blanks, MD;  Location: [place];  Service: Open Heart Surgery;  Laterality: N/A;     Current Meds  Medication Sig  . acetaminophen (TYLENOL) 500 MG tablet Take 1,000 mg by mouth daily as needed (sinus headaches).  . ALPRAZolam (XANAX) 0.25 MG tablet Take 0.25 mg by mouth 3 (three) times daily as needed for anxiety or sleep.   Marland Kitchen amoxicillin (AMOXIL) 500 MG capsule Take 4 capsules by mouth as needed 1 hour prior to dental procedures  . aspirin EC 81 MG EC tablet Take 1 tablet (81 mg total) by mouth daily.  Marland Kitchen buPROPion (WELLBUTRIN SR) 150 MG 12 hr tablet Take 150 mg by mouth daily before breakfast.   . citalopram (CELEXA) 10 MG tablet Take 10 mg by mouth at bedtime.   . diclofenac (VOLTAREN) 75 MG EC tablet Take 75 mg by mouth 2 (two) times daily.  . eszopiclone (LUNESTA) 2 MG TABS tablet Take 1-2 mg by mouth at bedtime.   Marland Kitchen HYDROcodone-acetaminophen (NORCO/VICODIN) 5-325 MG tablet Take 1-2 tablets by mouth every 6 (six) hours as needed for moderate pain.  Marland Kitchen levothyroxine (SYNTHROID, LEVOTHROID) 50 MCG tablet Take 50 mcg by mouth once daily  . metoprolol succinate (TOPROL-XL) 25 MG 24 hr tablet Take 25 mg by mouth once daily  . olmesartan-hydrochlorothiazide (BENICAR HCT) 40-12.5 MG tablet Take 1 tablet by mouth daily.     Allergies:   Betadine [povidone iodine]; Latex; and Zestril [lisinopril]   Social History   Tobacco Use  . Smoking status: Former Smoker    Packs/day: 0.50    Years: 20.00    Pack years: 10.00    Types: Cigarettes    Last attempt to quit: 03/15/1964    Years since quitting: 54.3  . Smokeless tobacco: Never Used  Substance Use Topics  . Alcohol use: Yes    Comment: social  . Drug use: No     Family Hx: The patient's family history includes Breast cancer in her sister; Heart disease in her father and maternal  grandfather; Stroke in her mother.  ROS:   Please see the history of present illness.    All other systems reviewed and are negative.   Prior CV studies:   The following studies were reviewed today:  TAVR OPERATIVE NOTE   Date of Procedure:08/02/2017  Preoperative Diagnosis:Severe Aortic Stenosis   Postoperative Diagnosis:Same   Procedure:   Transcatheter Aortic Valve Replacement - Transfemoral Approach Edwards Sapien 3 THV (size 41mm, model # [id number], serial #[id number])  Co-Surgeons:Christopher Angelena Form, MD and Gaye Pollack, MD   Pre-operative Echo Findings: ? Severe aortic stenosis ? Normalleft ventricular systolic function  Post-operative Echo Findings: ? Noparavalvular leak ? Normalleft ventricular systolic function  _________________   Post operative echo 08/03/17: Study Conclusions - Left ventricle: The cavity size was normal. Systolic function was vigorous. The  estimated ejection fraction was in the range of 65% to 70%. Wall motion was normal; there were no regional wall motion abnormalities. - Aortic valve: Mean gradient (S): 14 mm Hg. Valve area (VTI): 1.76 cm^2. Valve area (Vmax): 1.43 cm^2. Valve area (Vmean): 1.72 cm^2. - Mitral valve: Calcified annulus. Mildly thickened leaflets . There was mild regurgitation. Valve area by pressure half-time: 1.96 cm^2. - Right ventricle: Systolic function was normal. - Right atrium: The atrium was mildly dilated. - Tricuspid valve: There was moderate regurgitation. - Pulmonary arteries: Systolic pressure was moderately increased. PA peak pressure: 54 mm Hg (S). - Inferior vena cava: The vessel was normal in size. Impressions: - S/P TAVR with a 26 mm Edwards-SAPIEN 3 valve. Normal transaortic velocities with peak/mean 28/14 mmHg and mild paravalvular leak.   ______________  2D ECHO 09/01/17 (30 days  s/p TAVR) - Left ventricle: The cavity size was normal. Wall thickness was increased in a pattern of mild LVH. Systolic function was vigorous. The estimated ejection fraction was in the range of 65% to 70%. Wall motion was normal; there were no regional wall motion abnormalities. Doppler parameters are consistent with abnormal left ventricular relaxation (grade 1 diastolic dysfunction). - Aortic valve: A TAVR bioprosthesis 67mm Edwards Sapien THV via the TF approach on 08/02/17 was present. There was no regurgitation. - Mitral valve: Moderately calcified annulus. - Left atrium: The atrium was mildly dilated. - Right ventricle: The cavity size was mildly dilated. Wall thickness was normal.   Labs/Other Tests and Data Reviewed:    EKG:  No ECG reviewed.  Recent Labs: 07/29/2017: ALT 15; B Natriuretic Peptide 281.2 08/03/2017: BUN 13; Creatinine, Ser 0.96; Hemoglobin 9.5; Magnesium 1.5; Platelets 214; Potassium 3.8; Sodium 133   Recent Lipid Panel No results found for: CHOL, TRIG, HDL, CHOLHDL, LDLCALC, LDLDIRECT  Wt Readings from Last 3 Encounters:  07/14/18 182 lb (82.6 kg)  12/28/17 188 lb (85.3 kg)  09/22/17 184 lb 12.8 oz (83.8 kg)     Objective:    Vital Signs:  BP (!) 160/78   Pulse 80   Wt 182 lb (82.6 kg)   BMI 36.76 kg/m     ASSESSMENT & PLAN:    Severe AS s/p TAVR: she has NYHA class I symptoms. She is mostly limited by gait imbalance and weakness in her legs. KCCQ completed over the phone. SBE prophylaxis discussed; she gets Amoxil from her dentist. Echo pushed out until July given covid 19 pandemic.   Chronic diastolic CHF:no s/s CHF. Continue Benicar HCT  HTN: BP elevated today. She is reluctant to make any medication changes. She will keep her eye on her BP over the next week and call me with her log.   COVID-19 Education: the signs and symptoms of COVID-19 were discussed with the patient and how to seek care for testing (follow up with  PCP or arrange E-visit).  The importance of social distancing was discussed today.  Time:   Today, I have spent 25 minutes with the patient with telehealth technology discussing the above problems.     Medication Adjustments/Labs and Tests Ordered: Current medicines are reviewed at length with the patient today.  Concerns regarding medicines are outlined above.   Tests Ordered: No orders of the defined types were placed in this encounter.   Medication Changes: No orders of the defined types were placed in this encounter.   Disposition:  Follow up in 2 month(s) for echo.   Signed, Angelena Form, PA-C  07/14/2018 1:21 PM  [facility]

## 2018-07-14 NOTE — Patient Instructions (Addendum)
Hello Ms Tinch,   It was so nice to talk to you on the audio chat today. I am so glad you are doing so well. I just wanted to send you a recap of our discussion.   Please continue taking antibiotics prior to any dental work including cleanings (amoxcillin). Please continue on all your same medications.    Your 1 year echo has been scheduled for 09/21/18. Please continue regular follow up with you primary cardiologist. Please keep a log of your blood pressures and call me with them next week.   All appointment details are listed in upcoming appointments in MyChart or attached to this letter in your "after visit summary."  Please call us with any questions or concerns you may have and please stay safe during these uncertain times.  Nell Range [phone]

## 2018-08-01 ENCOUNTER — Telehealth: Payer: Self-pay | Admitting: Interventional Cardiology

## 2018-08-01 NOTE — Telephone Encounter (Signed)
Pt daughter called to report that th pr has had dizziness over the weekend and she was being treated for inner ear.. she has been on OTC motion sickness meds per Dr. Bea Graff... her dizziness has improved but her BP today at 4pm is running 210/10 HR70 with a headache I had her recheck at 4:40pm and it was 200/98 HR 70.. per Dr. Marlou Porch DOD.Marland Kitchen pt to take extra Metoprolol 25mg  now and may increase to 50mg  in the morning... pt to call Dr. Bea Graff her PMD treating her with the OTC meds and discuss if that is possibly elevating her BP... pt denies chest pain, palpitations.   I advised her to watch her NA and caffeine intake and to keep track of her BP the rest of the evening. To call our office of she has any further problems.

## 2018-08-01 NOTE — Telephone Encounter (Signed)
New Message   Pt c/o BP issue: STAT if pt c/o blurred vision, one-sided weakness or slurred speech  1. What are your last 5 BP readings? 210/100 bp  2. Are you having any other symptoms (ex. Dizziness, headache, blurred vision, passed out)? Dizziness, headache   3. What is your BP issue? B/p is elevated

## 2018-08-03 ENCOUNTER — Other Ambulatory Visit: Payer: Self-pay

## 2018-08-03 ENCOUNTER — Ambulatory Visit (INDEPENDENT_AMBULATORY_CARE_PROVIDER_SITE_OTHER): Payer: Medicare Other | Admitting: Podiatry

## 2018-08-03 ENCOUNTER — Encounter: Payer: Self-pay | Admitting: Podiatry

## 2018-08-03 VITALS — Temp 97.3°F

## 2018-08-03 DIAGNOSIS — Q828 Other specified congenital malformations of skin: Secondary | ICD-10-CM

## 2018-08-03 DIAGNOSIS — M2041 Other hammer toe(s) (acquired), right foot: Secondary | ICD-10-CM | POA: Diagnosis not present

## 2018-08-03 NOTE — Progress Notes (Signed)
She presents today just wanting me to check the tip of the third toe.  Objective: Vital signs are stable she is alert and oriented x3.  Hallux nail of the right foot appears to be very loose and I removed that today just by lifting the edge of the nail.  There is no purulence no malodor the distal aspect of the third toe appears to have gone on to heal uneventfully there is no clavus and no ulceration.  Assessment: Well-healing third toe right foot distal clavus nail avulsion today due to nail trauma most likely.  Plan: Follow-up with her on an as-needed basis.

## 2018-08-04 NOTE — Telephone Encounter (Signed)
Pts daughter called to report that the pt BP has been staying elevated even  since the increase in the metoprolol on 08/01/18... she saw her PMD Dr. Bea Graff yesterday and her BP was 210/100 so he prescribed Clonidine .05mg  bid.Marland Kitchen   Her BP with a new cuff was 245/100 this morning so they called EMS and BP checked manually was 166/10 HR70...   She has been asymptomatic.. no headache, vertigo she had last weekend has improved, and denies SOB, Chest pain..   Dr. Willette Pa office has already closed for the day.   She had BMET 08/02/18 with Dr. Bea Graff but unaware of results. Labs are in Care every where and BMET was WNL.   Will forward to Dr. Tamala Julian for review.. they are very anxious about the BP especially since it is going to be a long weekend with the office closed on Monday.

## 2018-08-04 NOTE — Telephone Encounter (Signed)
    Patients daughter, Jenny Reichmann, called our office with concerns regarding her mothers blood pressure. She reported her blood pressure has been persistently elevated over the past couple weeks. Her metoprolol was increased at her visit with Miguel Rota 07/14/2018. Her daughter reported that the patients blood pressure continued to be elevated. Over this past weekend the patient developed dizziness which responded well to meclizine, however on 08/02/2018 she developed a HA prompting a visit with her PCP. Her BP at that time was reported to be 182/98 and she was started on clonidine 0.05mg  BID. Daughter reported that the patient started this medication 08/03/2018 in the evening. On waking 08/04/2018, she reported not feeling well, however did not have any HA, vision changes, or N/V. A home health aid checked her BP with an automatic cuff and reported BP 245/100, at which time EMS was activated. Her manual BP by EMS was 166/78 with a normal HR, therefore patient was not transported to the ER. Her daughter stated she checked her BP with the same automatic cuff later in the day and her SBP was in the 120s. Just prior to my call her BP was 166/76.   Given labile blood pressure readings and recent changes to HTN medication regimen, I advised a period of watchful waiting. Daughter was instructed to monitor the patients blood pressure 2-3 times per day and keep a log. I suggested she contact our office Sunday or Monday to discuss readings to determine if further mediation adjustments need to be made. I instructed her daughter to bring the patient to the ED if her blood pressure is persistently with SBP >200 or DBP >100, she develops HA, vision changes, focal weakness, chest pain, or lightheadedness/syncope. She was in agreement with the plan. I will route this call to Dr. Tamala Julian.  [name], PA-C 08/04/18, 4:14 PM

## 2018-08-04 NOTE — Telephone Encounter (Signed)
New Message   Pt c/o BP issue: STAT if pt c/o blurred vision, one-sided weakness or slurred speech  1. What are your last 5 BP readings? She has three over the weekend it was 210/100, this mornings was 245/100, and the paramedics are there and they got 166/100.   2. Are you having any other symptoms (ex. Dizziness, headache, blurred vision, passed out)? NO  3. What is your BP issue? Patients BP has been elevated lately and paramedics had to come to the house and the BP is at 166/100.  Patient's daughter wants to speak to a nurse to see if her mother should come in for an appt.

## 2018-08-05 NOTE — Telephone Encounter (Signed)
Add amlodipine 2.5 mg daily

## 2018-08-10 ENCOUNTER — Other Ambulatory Visit: Payer: Self-pay | Admitting: Medical

## 2018-08-10 MED ORDER — AMLODIPINE BESYLATE 2.5 MG PO TABS: 2.5000 mg | ORAL_TABLET | Freq: Every day | ORAL | 3 refills | Status: DC

## 2018-08-10 NOTE — Telephone Encounter (Signed)
   Spoke with patient's daughter, [name], on the phone in follow-up of her recent call regarding elevated blood pressures for her mother, [name]. She reports SBP predominantly in the 160s over the past week. She states her mother does not like the clonidine as this medication makes her sleepy. Instructed patient to wean off of clonidine over the next couple days, taking only 0.05mg  once daily for 2 days, then discontinuing this medication. Will start low dose amlodipine 2.5mg  daily tomorrow which can be uptitrated as tolerated pending her mothers response to these medication changes. Daughter will continue to monitor her mothers blood pressure closely and notify the office if persistently elevated.   [name], PA-C 08/10/18; 7:13 PM

## 2018-08-14 DIAGNOSIS — R42 Dizziness and giddiness: Secondary | ICD-10-CM | POA: Insufficient documentation

## 2018-08-14 HISTORY — DX: Dizziness and giddiness: R42

## 2018-08-15 ENCOUNTER — Other Ambulatory Visit: Payer: Self-pay | Admitting: Physician Assistant

## 2018-08-15 ENCOUNTER — Telehealth: Payer: Self-pay | Admitting: Physician Assistant

## 2018-08-15 MED ORDER — OLMESARTAN MEDOXOMIL 40 MG PO TABS: 40.0000 mg | ORAL_TABLET | Freq: Every day | ORAL | 3 refills | Status: DC

## 2018-08-15 NOTE — Telephone Encounter (Signed)
  [facility]   MULTIDISCIPLINARY HEART VALVE TEAM  Patient's daughter called our office about elevated BPs. Per review of notes she has been talking with her PCP, Cherlynn Polo PA-C and Dr. Tamala Julian. She had a lot of fatigue with clonidine and this was stopped. Her clonidine was tapered and last dose was on sat 5/30. She started amlodipine 2.5mg  daily on 08/11/18 by Dr. Tamala Julian. She has continued on Benicar 40mg  daily and Toprol XL 25mg  daily. Patient's daughter called our office today reporting persistently elevated BPs.   Sunday BP 160/84 Unknown HR. Monday 210/100 HR 70 Today 152/82 HR 66.   I have asked her to increase amlodipine 2.5mg  to 5mg  daily and continue Benicar 40mg  daily and Toprol XL 25mg  daily.  I have asked her to keep a log of BPs over the next week and call me back with log. I suspect amlodipine will need to be further titrated to 10mg  daily. I will continue to follow.   Angelena Form PA-C  [facility]

## 2018-08-16 NOTE — Telephone Encounter (Signed)
If Amlodipine 5 is not getting her to target, I would consider low dose diuretic (Hctz) assuming no contraindication

## 2018-08-17 DIAGNOSIS — C773 Secondary and unspecified malignant neoplasm of axilla and upper limb lymph nodes: Secondary | ICD-10-CM | POA: Diagnosis not present

## 2018-08-17 DIAGNOSIS — Z170 Estrogen receptor positive status [ER+]: Secondary | ICD-10-CM

## 2018-08-17 DIAGNOSIS — C50912 Malignant neoplasm of unspecified site of left female breast: Secondary | ICD-10-CM | POA: Diagnosis not present

## 2018-08-29 ENCOUNTER — Other Ambulatory Visit: Payer: Self-pay | Admitting: Physician Assistant

## 2018-08-29 MED ORDER — HYDROCHLOROTHIAZIDE 12.5 MG PO CAPS: 12.5000 mg | ORAL_CAPSULE | Freq: Every day | ORAL | 11 refills | Status: DC

## 2018-08-29 NOTE — Telephone Encounter (Signed)
  [facility]   MULTIDISCIPLINARY HEART VALVE TEAM   Patient's daughter called in to discuss Ms. Reave's BP. She had called last week and BP were still elevated. I suggested restarting HCTZ 12.5mg  daily (which she already had at the house). She did not start taking it until yesterday. Bps improved to 146/80 (6/15) and 137/67 (6/16). She did have some dizziness this AM. I have asked her to continue to take this medication and watch the dizziness as it was likely unrelated. They will call me back next week for an update. 1 year TAVR echo rescheduled for 7/6.   Angelena Form PA-C  [facility]

## 2018-09-06 ENCOUNTER — Encounter: Payer: Self-pay | Admitting: Thoracic Surgery (Cardiothoracic Vascular Surgery)

## 2018-09-12 DIAGNOSIS — H43812 Vitreous degeneration, left eye: Secondary | ICD-10-CM

## 2018-09-12 DIAGNOSIS — H35363 Drusen (degenerative) of macula, bilateral: Secondary | ICD-10-CM

## 2018-09-12 DIAGNOSIS — H35372 Puckering of macula, left eye: Secondary | ICD-10-CM

## 2018-09-12 HISTORY — DX: Puckering of macula, left eye: H35.372

## 2018-09-12 HISTORY — DX: Vitreous degeneration, left eye: H43.812

## 2018-09-12 HISTORY — DX: Drusen (degenerative) of macula, bilateral: H35.363

## 2018-09-18 ENCOUNTER — Other Ambulatory Visit (HOSPITAL_COMMUNITY): Payer: Medicare Other

## 2018-09-21 ENCOUNTER — Other Ambulatory Visit (HOSPITAL_COMMUNITY): Payer: Medicare Other

## 2018-10-05 ENCOUNTER — Telehealth (HOSPITAL_COMMUNITY): Payer: Self-pay | Admitting: Radiology

## 2018-10-05 NOTE — Telephone Encounter (Signed)
Please call daughter to schedule echocardiogram. She is unavailable at this time.

## 2018-10-09 ENCOUNTER — Other Ambulatory Visit: Payer: Self-pay | Admitting: Medical

## 2018-10-18 ENCOUNTER — Encounter (HOSPITAL_COMMUNITY): Payer: Self-pay | Admitting: Physician Assistant

## 2018-10-26 ENCOUNTER — Other Ambulatory Visit: Payer: Self-pay

## 2018-10-26 ENCOUNTER — Ambulatory Visit (HOSPITAL_COMMUNITY): Payer: Medicare Other | Attending: Cardiovascular Disease

## 2018-10-26 DIAGNOSIS — Z952 Presence of prosthetic heart valve: Secondary | ICD-10-CM | POA: Insufficient documentation

## 2018-10-30 ENCOUNTER — Telehealth: Payer: Self-pay | Admitting: Physician Assistant

## 2018-10-30 NOTE — Telephone Encounter (Signed)
New Message   Patient returning your call for Echo results.

## 2018-10-30 NOTE — Telephone Encounter (Signed)
Informed pt of results. Pt verbalized understanding. 

## 2018-11-02 ENCOUNTER — Other Ambulatory Visit: Payer: Self-pay | Admitting: Physician Assistant

## 2018-11-02 NOTE — Telephone Encounter (Signed)
Okay to refill or should this be deferred to PCP? Please advise. Thanks, MI

## 2018-11-09 DIAGNOSIS — M899 Disorder of bone, unspecified: Secondary | ICD-10-CM | POA: Diagnosis not present

## 2018-11-09 DIAGNOSIS — C50119 Malignant neoplasm of central portion of unspecified female breast: Secondary | ICD-10-CM | POA: Diagnosis not present

## 2018-11-09 DIAGNOSIS — C50219 Malignant neoplasm of upper-inner quadrant of unspecified female breast: Secondary | ICD-10-CM | POA: Diagnosis not present

## 2018-11-09 DIAGNOSIS — C50419 Malignant neoplasm of upper-outer quadrant of unspecified female breast: Secondary | ICD-10-CM

## 2018-12-11 ENCOUNTER — Telehealth: Payer: Self-pay | Admitting: Interventional Cardiology

## 2018-12-11 NOTE — Telephone Encounter (Signed)
Pt called and left message on TAVR coordinators VM asking for Dr. Thompson Caul nurse to call back.   Attempted to contact.  Left message to call back.

## 2018-12-14 NOTE — Telephone Encounter (Signed)
Left message to call back  

## 2019-01-03 ENCOUNTER — Ambulatory Visit: Payer: Self-pay | Admitting: Surgery

## 2019-01-04 ENCOUNTER — Telehealth: Payer: Self-pay | Admitting: *Deleted

## 2019-01-04 NOTE — Telephone Encounter (Signed)
   [facility] Pre-operative Risk Assessment    Request for surgical clearance:  1. What type of surgery is being performed? HERNIA SURGERY  2. When is this surgery scheduled? TBD   3. What type of clearance is required (medical clearance vs. Pharmacy clearance to hold med vs. Both)? MEDICAL  4. Are there any medications that need to be held prior to surgery and how long? ASA   5. Practice name and name of physician performing surgery? [facility]; DR. Sherren Mocha GERKIN   6. What is your office phone number [phone]    7.   What is your office fax number [phone]  8.   Anesthesia type (None, local, MAC, general) ? NOT LISTED; GENERAL?   Julaine Hua 01/04/2019, 12:40 PM  _________________________________________________________________   (provider comments below)

## 2019-01-05 NOTE — Telephone Encounter (Signed)
Please add pre-operative clearance to her appointment coming up with Dr. Tamala Julian on 01/19/2019.   Thank you  Sharee Pimple

## 2019-01-05 NOTE — Telephone Encounter (Signed)
I will route to Dr. Tamala Julian for 11/6 appt. I will fax to surgeon's office to update about appt. I will remove from the pre op call back pool.

## 2019-01-05 NOTE — Telephone Encounter (Signed)
Yes, okay to hols aspirin.

## 2019-01-09 NOTE — Telephone Encounter (Signed)
Please add preop clearance to her appointment coming up with Dr. Tamala Julian

## 2019-01-10 NOTE — Telephone Encounter (Signed)
Patient to see Dr. Tamala Julian 01/19/2019. Surgical clearance to be addressed at that appt. This note will be removed from the preop pool.   Richardson Dopp, PA-C    01/10/2019 9:06 AM

## 2019-01-17 ENCOUNTER — Encounter: Payer: Self-pay | Admitting: Interventional Cardiology

## 2019-01-17 NOTE — Telephone Encounter (Signed)
error 

## 2019-01-18 NOTE — Progress Notes (Signed)
Cardiology Office Note:    Date:  01/19/2019   ID:  [name], DOB 1925/10/11, MRN [id number]  PCP:  Ernestene Kiel, MD  Cardiologist:  [name], MD   Referring MD: [name], MD   Chief Complaint  Patient presents with  . Coronary Artery Disease  . Cardiac Valve Problem    TAVR    History of Present Illness:    [name] is a 85 y.o. female with a hx of CAD s/p PCI to RCA in 2010, CKD III, HLD, HTN, hypothyroidism, breast cancer s/p lumpectomy and radiation, chronic diastolic CHF, and severe ASs/p TAVR (08/02/17).  Hoping to have ventral hernia repair done by Dr. Armandina Gemma.  She is to have general anesthesia.  She has dyspnea on exertion that is markedly improved since TAVR.  Current dyspnea on exertion is related to chronic stable diastolic heart failure.  She is not having angina.  She denies orthopnea and PND.  No palpitations or syncope.  Past Medical History:  Diagnosis Date  . Aortic stenosis, severe    a. 07/2017: s/p TAVR w/ an Edwards Sapien 3 THV (size 26 mm, model # [id number], serial # [id number])  . CAD (coronary artery disease)    a. 2010: s/p stent to RCA  . Chronic kidney disease, stage III (moderate)   . Chronic sinusitis   . Depressive disorder   . History of breast cancer    a. s/p lumpectomies and XRT  . Hyperlipemia   . Hypertension   . Hypothyroid   . Insomnia   . Memory loss   . Osteoarthritis   . Reflux esophagitis     Past Surgical History:  Procedure Laterality Date  . ANGIOPLASTY    . BREAST LUMPECTOMY     x2  . FOOT TENDON SURGERY    . GALLBLADDER SURGERY    . INTRAOPERATIVE TRANSTHORACIC ECHOCARDIOGRAM N/A 08/02/2017   Procedure: INTRAOPERATIVE TRANSTHORACIC ECHOCARDIOGRAM;  Surgeon: Burnell Blanks, MD;  Location: [facility];  Service: Open Heart Surgery;  Laterality: N/A;  . REPLACEMENT TOTAL KNEE BILATERAL    . RIGHT/LEFT HEART CATH AND CORONARY ANGIOGRAPHY N/A 07/05/2017   Procedure: RIGHT/LEFT HEART  CATH AND CORONARY ANGIOGRAPHY;  Surgeon: Belva Crome, MD;  Location: [facility];  Service: Cardiovascular;  Laterality: N/A;  . SKIN CANCER EXCISION  2018   right nostril   . TOTAL ABDOMINAL HYSTERECTOMY    . TRANSCATHETER AORTIC VALVE REPLACEMENT, TRANSFEMORAL N/A 08/02/2017   Procedure: TRANSCATHETER AORTIC VALVE REPLACEMENT, TRANSFEMORAL;  Surgeon: Burnell Blanks, MD;  Location: [facility];  Service: Open Heart Surgery;  Laterality: N/A;    Current Medications: Current Meds  Medication Sig  . acetaminophen (TYLENOL) 500 MG tablet Take 1,000 mg by mouth daily as needed (sinus headaches).  . ALPRAZolam (XANAX) 0.25 MG tablet Take 0.25 mg by mouth 3 (three) times daily as needed for anxiety or sleep.   Marland Kitchen amLODipine (NORVASC) 2.5 MG tablet TAKE ONE (1) TABLET BY MOUTH ONCE DAILY  . amoxicillin (AMOXIL) 500 MG capsule Take 4 capsules by mouth as needed 1 hour prior to dental procedures  . buPROPion (WELLBUTRIN SR) 150 MG 12 hr tablet Take 150 mg by mouth daily before breakfast.   . citalopram (CELEXA) 10 MG tablet Take 10 mg by mouth at bedtime.   . diclofenac (VOLTAREN) 75 MG EC tablet TAKE ONE (1) TABLET BY MOUTH TWO (2) TIMES DAILY  . eszopiclone (LUNESTA) 2 MG TABS tablet Take 1-2  mg by mouth at bedtime.   . hydrochlorothiazide (MICROZIDE) 12.5 MG capsule Take 1 capsule (12.5 mg total) by mouth daily.  Marland Kitchen HYDROcodone-acetaminophen (NORCO/VICODIN) 5-325 MG tablet Take 1-2 tablets by mouth every 6 (six) hours as needed for moderate pain.  Marland Kitchen levothyroxine (SYNTHROID, LEVOTHROID) 50 MCG tablet Take 50 mcg by mouth once daily  . metoprolol succinate (TOPROL-XL) 25 MG 24 hr tablet Take 25 mg by mouth once daily  . olmesartan (BENICAR) 40 MG tablet Take 1 tablet (40 mg total) by mouth daily.     Allergies:   Betadine [povidone iodine], Latex, and Zestril [lisinopril]   Social History   Socioeconomic History  . Marital status: Widowed    Spouse name: Not on file  . Number of  children: 3  . Years of education: Not on file  . Highest education level: Not on file  Occupational History  . 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 Financial resource strain: Not on file  . Food insecurity    Worry: Not on file    Inability: Not on file  . Transportation needs    Medical: Not on file    Non-medical: Not on file  Tobacco Use  . Smoking status: Former Smoker    Packs/day: 0.50    Years: 20.00    Pack years: 10.00    Types: Cigarettes    Quit date: 03/15/1964    Years since quitting: 54.8  . Smokeless tobacco: Never Used  Substance and Sexual Activity  . Alcohol use: Yes    Comment: social  . Drug use: No  . Sexual activity: Not on file  Lifestyle  . Physical activity    Days per week: Not on file    Minutes per session: Not on file  . Stress: Not on file  Relationships  . Social Herbalist on phone: Not on file    Gets together: Not on file    Attends religious service: Not on file    Active member of club or organization: Not on file    Attends meetings of clubs or organizations: Not on file    Relationship status: Not on file  Other Topics Concern  . Not on file  Social History Narrative  . Not on file     Family History: The patient's family history includes Breast cancer in her sister; Heart disease in her father and maternal grandfather; Stroke in her mother.  ROS:   Please see the history of present illness.    Abdominal discomfort within the region of prior gallbladder surgery related to the hernia that she is now suffering from.  It causes a lot of discomfort and is a quality of life issue.  All other systems reviewed and are negative.  EKGs/Labs/Other Studies Reviewed:    The following studies were reviewed today: No recent cardiovascular imaging  EKG:  EKG normal sinus rhythm, vertical axis, tall R wave V1, and PVC noted  Recent Labs: No results found for requested labs within last  8760 hours.  Recent Lipid Panel No results found for: CHOL, TRIG, HDL, CHOLHDL, VLDL, LDLCALC, LDLDIRECT  Physical Exam:    VS:  BP (!) 178/92   Pulse 76   Ht 4\' 11"  (1.499 m)   Wt 189 lb 12.8 oz (86.1 kg)   SpO2 96%   BMI 38.33 kg/m     Wt Readings from Last 3 Encounters:  01/19/19 189 lb 12.8 oz (86.1 kg)  07/14/18  182 lb (82.6 kg)  12/28/17 188 lb (85.3 kg)     GEN: Moderate obesity.  Compatible with stated age in appearance.. No acute distress HEENT: Normal NECK: No JVD. LYMPHATICS: No lymphadenopathy CARDIAC: 2/6 systolic crescendo decrescendo murmur without diastolic murmur.  RRR without, gallop, or edema. VASCULAR:  Normal Pulses. No bruits. RESPIRATORY:  Clear to auscultation without rales, wheezing or rhonchi  ABDOMEN: Soft, non-tender, non-distended, No pulsatile mass, MUSCULOSKELETAL: No deformity  SKIN: Warm and dry NEUROLOGIC:  Alert and oriented x 3 PSYCHIATRIC:  Normal affect   ASSESSMENT:    1. Preoperative cardiovascular examination   2. S/P TAVR (transcatheter aortic valve replacement)   3. Chronic diastolic CHF (congestive heart failure) ([place])   4. Essential hypertension, benign   5. Coronary artery disease involving native coronary artery of native heart with angina pectoris ([place])   6. Mixed hyperlipidemia   7. Renal insufficiency    PLAN:    In order of problems listed above:  1. The patient is stable from cardiac standpoint.  She is elderly and therefore is at increased risk for any surgical procedure.  There are no cardiac contraindications to the upcoming surgical procedure. 2. Stable valve function based upon auscultation 3. Chronic stable diastolic heart failure, [place] Heart Association class III. 4. Elevated blood pressure today likely related to the physical stress of getting physically into the office. 5. No symptoms to suggest angina. 6. No recent LDL. 7. Not discussed 8. The W3 is being adhered to at home to avoid COVID-19.   Clinical follow-up in 1 year.   Medication Adjustments/Labs and Tests Ordered: Current medicines are reviewed at length with the patient today.  Concerns regarding medicines are outlined above.  Orders Placed This Encounter  Procedures  . EKG 12-Lead   No orders of the defined types were placed in this encounter.   Patient Instructions  Medication Instructions:  Your physician recommends that you continue on your current medications as directed. Please refer to the Current Medication list given to you today.  *If you need a refill on your cardiac medications before your next appointment, please call your pharmacy*  Lab Work: None If you have labs (blood work) drawn today and your tests are completely normal, you will receive your results only by: Marland Kitchen MyChart Message (if you have MyChart) OR . A paper copy in the mail If you have any lab test that is abnormal or we need to change your treatment, we will call you to review the results.  Testing/Procedures: None  Follow-Up: At [facility], you and your health needs are our priority.  As part of our continuing mission to provide you with exceptional heart care, we have created designated Provider Care Teams.  These Care Teams include your primary Cardiologist (physician) and Advanced Practice Providers (APPs -  Physician Assistants and Nurse Practitioners) who all work together to provide you with the care you need, when you need it.  Your next appointment:   12 months  The format for your next appointment:   In Person  Provider:   You may see [name], MD or one of the following Advanced Practice Providers on your designated Care Team:    Truitt Merle, NP  Cecilie Kicks, NP  Kathyrn Drown, NP   Other Instructions      Signed, [name], MD  01/19/2019 2:10 PM    [place]

## 2019-01-19 ENCOUNTER — Ambulatory Visit (INDEPENDENT_AMBULATORY_CARE_PROVIDER_SITE_OTHER): Payer: Medicare Other | Admitting: Interventional Cardiology

## 2019-01-19 ENCOUNTER — Other Ambulatory Visit: Payer: Self-pay

## 2019-01-19 ENCOUNTER — Encounter: Payer: Self-pay | Admitting: Interventional Cardiology

## 2019-01-19 VITALS — BP 178/92 | HR 76 | Ht 59.0 in | Wt 189.8 lb

## 2019-01-19 DIAGNOSIS — I25119 Atherosclerotic heart disease of native coronary artery with unspecified angina pectoris: Secondary | ICD-10-CM

## 2019-01-19 DIAGNOSIS — Z952 Presence of prosthetic heart valve: Secondary | ICD-10-CM

## 2019-01-19 DIAGNOSIS — N289 Disorder of kidney and ureter, unspecified: Secondary | ICD-10-CM

## 2019-01-19 DIAGNOSIS — E782 Mixed hyperlipidemia: Secondary | ICD-10-CM

## 2019-01-19 DIAGNOSIS — I5032 Chronic diastolic (congestive) heart failure: Secondary | ICD-10-CM | POA: Diagnosis not present

## 2019-01-19 DIAGNOSIS — I10 Essential (primary) hypertension: Secondary | ICD-10-CM | POA: Diagnosis not present

## 2019-01-19 DIAGNOSIS — Z01810 Encounter for preprocedural cardiovascular examination: Secondary | ICD-10-CM

## 2019-01-19 NOTE — Patient Instructions (Signed)

## 2019-02-05 DIAGNOSIS — C50912 Malignant neoplasm of unspecified site of left female breast: Secondary | ICD-10-CM | POA: Diagnosis not present

## 2019-02-05 DIAGNOSIS — Z79811 Long term (current) use of aromatase inhibitors: Secondary | ICD-10-CM | POA: Diagnosis not present

## 2019-02-05 DIAGNOSIS — Z170 Estrogen receptor positive status [ER+]: Secondary | ICD-10-CM

## 2019-02-23 NOTE — Patient Instructions (Signed)
DUE TO COVID-19 ONLY ONE VISITOR IS ALLOWED TO COME WITH YOU AND STAY IN THE WAITING ROOM ONLY DURING PRE OP AND PROCEDURE DAY OF SURGERY. THE 1 VISITOR MAY VISIT WITH YOU AFTER SURGERY IN YOUR PRIVATE ROOM DURING VISITING HOURS ONLY!  YOU NEED TO HAVE A COVID 19 TEST ON_______ @_______ , THIS TEST MUST BE DONE BEFORE SURGERY, COME  [place], [place] , [address].  ([place]) ONCE YOUR COVID TEST IS COMPLETED, PLEASE BEGIN THE QUARANTINE INSTRUCTIONS AS OUTLINED IN YOUR HANDOUT.                [place]   Your procedure is scheduled on: 03/01/19   Report to [facility] Main  Entrance   Report to admitting at  6:30 AM     Call this number if you have problems the morning of surgery [phone]    Remember: Do not eat food or drink liquids :After Midnight.   BRUSH YOUR TEETH MORNING OF SURGERY AND RINSE YOUR MOUTH OUT, NO CHEWING GUM CANDY OR MINTS.     Take these medicines the morning of surgery with A SIP OF WATER: Metoprolol, amlodipine, Levothyroxine                                 You may not have any metal on your body including hair pins and              piercings  Do not wear jewelry, make-up, lotions, powders or perfumes, deodorant             Do not wear nail polish on your fingernails.  Do not shave  48 hours prior to surgery.           Do not bring valuables to the hospital. [place].  Contacts, dentures or bridgework may not be worn into surgery.  Leave suitcase in the car. After surgery it may be brought to your room.     Patients discharged the day of surgery will not be allowed to drive home.   IF YOU ARE HAVING SURGERY AND GOING HOME THE SAME DAY, YOU MUST HAVE AN ADULT TO DRIVE YOU HOME AND BE WITH YOU FOR 24 HOURS.   YOU MAY GO HOME BY TAXI OR UBER OR ORTHERWISE, BUT AN ADULT MUST ACCOMPANY YOU HOME AND STAY WITH YOU FOR 24 HOURS.  Name and phone number of  your driver:  Special Instructions: N/A              Please read over the following fact sheets you were given: _____________________________________________________________________             [facility] - Preparing for Surgery  Before surgery, you can play an important role.   Because skin is not sterile, your skin needs to be as free of germs as possible .  You can reduce the number of germs on your skin by washing with CHG (chlorahexidine gluconate) soap before surgery.   CHG is an antiseptic cleaner which kills germs and bonds with the skin to continue killing germs even after washing. Please DO NOT use if you have an allergy to CHG or antibacterial soaps.   If your skin becomes reddened/irritated stop using the CHG and inform your nurse when you arrive at Short  Stay. Do not shave (including legs and underarms) for at least 48 hours prior to the first CHG shower.    Please follow these instructions carefully:  1.  Shower with CHG Soap the night before surgery and the  morning of Surgery.  2.  If you choose to wash your hair, wash your hair first as usual with your  normal  shampoo.  3.  After you shampoo, rinse your hair and body thoroughly to remove the  shampoo.                                        4.  Use CHG as you would any other liquid soap.  You can apply chg directly  to the skin and wash                       Gently with a scrungie or clean washcloth.  5.  Apply the CHG Soap to your body ONLY FROM THE NECK DOWN.   Do not use on face/ open                           Wound or open sores. Avoid contact with eyes, ears mouth and genitals (private parts).                       Wash face,  Genitals (private parts) with your normal soap.             6.  Wash thoroughly, paying special attention to the area where your surgery  will be performed.  7.  Thoroughly rinse your body with warm water from the neck down.  8.  DO NOT shower/wash with your normal soap after using and  rinsing off  the CHG Soap.             9.  Pat yourself dry with a clean towel.            10.  Wear clean pajamas.            11.  Place clean sheets on your bed the night of your first shower and do not  sleep with pets. Day of Surgery : Do not apply any lotions/deodorants the morning of surgery.  Please wear clean clothes to the hospital/surgery center.  FAILURE TO FOLLOW THESE INSTRUCTIONS MAY RESULT IN THE CANCELLATION OF YOUR SURGERY PATIENT SIGNATURE_________________________________  NURSE SIGNATURE__________________________________  ________________________________________________________________________

## 2019-02-24 ENCOUNTER — Encounter (HOSPITAL_COMMUNITY): Payer: Self-pay | Admitting: Surgery

## 2019-02-24 DIAGNOSIS — K432 Incisional hernia without obstruction or gangrene: Secondary | ICD-10-CM

## 2019-02-24 HISTORY — DX: Incisional hernia without obstruction or gangrene: K43.2

## 2019-02-24 NOTE — H&P (Signed)
General Surgery [facility]  [name] DOB: 1926-01-01 Widowed / Language: [name] / Race: White Female   History of Present Illness   The patient is a 85 year old female who presents with an incisional hernia.  CHIEF COMPLAINT: incisional hernia  Patient is referred by [name] for surgical evaluation and management of an incisional hernia. Patient's cardiologist is [name]. Patient had undergone an open cholecystectomy by [name] many years ago. Over the past 2 years, the patient had noted a bulge in the upper abdomen which gradually increased in size. It causes mild discomfort. Patient had also had an abdominal hysterectomy. She has noted slow increase in size of the bulge but denies any signs or symptoms of intestinal obstruction. She has had no prior hernia repairs. She presents today accompanied by a family member who is a Music therapist. Of note, the patient underwent aortic valve replacement a little over one year ago. She is doing great from that procedure. She takes only baby aspirin.   Allergies Latex  Betadine *ANTISEPTICS & DISINFECTANTS*  Lisinopril *ANTIHYPERTENSIVES*   Medication History Amoxicillin (500MG  Capsule, Oral) Active. Tylenol (500MG  Capsule, Oral) Active. ALPRAZolam (0.25MG  Tablet, Oral) Active. Citalopram Hydrobromide (10MG  Tablet, Oral) Active. cloNIDine HCl (0.1MG  Tablet, Oral) Active. Diclofenac Sodium (75MG  Tablet DR, Oral) Active. Eszopiclone (2MG  Tablet, Oral) Active. HYDROcodone-Acetaminophen (5-325MG  Tablet, Oral) Active. Levothyroxine Sodium (50MCG Tablet, Oral) Active. Olmesartan Medoxomil (40MG  Tablet, Oral) Active. Metoprolol Succinate ER (25MG  Tablet ER 24HR, Oral) Active. buPROPion HCl ER (SR) (150MG  Tablet ER 12HR, Oral) Active. hydroCHLOROthiazide (12.5MG  Tablet, Oral) Active. Aspirin (81MG  Tablet, Oral) Active. Medications Reconciled  Vitals  Weight:  189.13 lb Height: 59in Body Surface Area: 1.8 m Body Mass Index: 38.2 kg/m  Temp.: 97.24F  Pulse: 92 (Regular)  P.OX: 91% (Room air) BP: 138/76 (Sitting, Left Arm, Standard)  Physical Exam   See vital signs recorded above  GENERAL APPEARANCE Development: normal Nutritional status: normal Gross deformities: none  SKIN Rash, lesions, ulcers: none Induration, erythema: none Nodules: none palpable  EYES Conjunctiva and lids: normal Pupils: equal and reactive Iris: normal bilaterally  EARS, NOSE, MOUTH, THROAT External ears: no lesion or deformity External nose: no lesion or deformity Hearing: grossly normal Patient is wearing a mask.  NECK Symmetric: yes Trachea: midline Thyroid: no palpable nodules in the thyroid bed  CHEST Respiratory effort: normal Retraction or accessory muscle use: no Breath sounds: normal bilaterally Rales, rhonchi, wheeze: none  CARDIOVASCULAR Auscultation: regular rhythm, normal rate Murmurs: grade II systolic Pulses: carotid and radial pulse 2+ palpable Lower extremity edema: none Lower extremity varicosities: none  ABDOMEN Distension: none Masses: none palpable Tenderness: none Hepatosplenomegaly: not present Hernia: At the medial aspect of the right subcostal incision is a subcutaneous bulge measuring approximately 4 cm in diameter. This is firm, rubbery, and nontender. It is not reducible. It does augment slightly with Valsalva and sit up maneuver. Lower midline abdominal incision does not demonstrate evidence of incisional hernia.  MUSCULOSKELETAL Station and gait: normal Digits and nails: no clubbing or cyanosis Muscle strength: grossly normal all extremities Range of motion: grossly normal all extremities Deformity: none  LYMPHATIC Cervical: none palpable Supraclavicular: none palpable  PSYCHIATRIC Oriented to person, place, and time: yes Mood and affect: normal for situation Judgment and insight:  appropriate for situation    Assessment & Plan  INCISIONAL HERNIA OF ANTERIOR ABDOMINAL WALL WITHOUT OBSTRUCTION OR GANGRENE (K43.2)  Pt Education - Pamphlet Given - Hernia Surgery: discussed with patient  and provided information.  Patient is referred by her primary care physician for surgical evaluation and management of incisional hernia. The patient is accompanied by her family. They are provided with written literature on hernia surgery to review at home.  Patient has a bulge in the epigastrium which is likely related to her right subcostal incision from cholecystectomy many years ago. The bulge has been present for a couple of years and gradually increasing in size. It is consistent with an incisional hernia likely containing incarcerated omentum. I have recommended operative repair. This can be done through an open surgical technique under anesthesia. We would likely employ a mesh patch after reducing the hernia. We discussed restrictions on her activities following the procedure. Patient understands and wishes to proceed with surgery in the near future.  The risks and benefits of the procedure have been discussed at length with the patient. The patient understands the proposed procedure, potential alternative treatments, and the course of recovery to be expected. All of the patient's questions have been answered at this time. The patient wishes to proceed with surgery.  [name] Gemma, MD [facility] Office: [phone]

## 2019-02-26 ENCOUNTER — Other Ambulatory Visit (HOSPITAL_COMMUNITY)
Admission: RE | Admit: 2019-02-26 | Discharge: 2019-02-26 | Disposition: A | Discharge status: Home / Self Care | Payer: Medicare Other | Source: Ambulatory Visit | Attending: Surgery | Admitting: Surgery

## 2019-02-26 ENCOUNTER — Encounter (HOSPITAL_COMMUNITY): Payer: Self-pay

## 2019-02-26 ENCOUNTER — Encounter (HOSPITAL_COMMUNITY)
Admission: RE | Admit: 2019-02-26 | Discharge: 2019-02-26 | Disposition: A | Discharge status: Home / Self Care | Payer: Medicare Other | Source: Ambulatory Visit | Attending: Surgery | Admitting: Surgery

## 2019-02-26 ENCOUNTER — Encounter (HOSPITAL_COMMUNITY): Admission: RE | Admit: 2019-02-26 | Payer: Medicare Other | Source: Ambulatory Visit

## 2019-02-26 ENCOUNTER — Other Ambulatory Visit: Payer: Self-pay

## 2019-02-26 DIAGNOSIS — Z79890 Hormone replacement therapy (postmenopausal): Secondary | ICD-10-CM | POA: Insufficient documentation

## 2019-02-26 DIAGNOSIS — Z853 Personal history of malignant neoplasm of breast: Secondary | ICD-10-CM | POA: Insufficient documentation

## 2019-02-26 DIAGNOSIS — E039 Hypothyroidism, unspecified: Secondary | ICD-10-CM | POA: Diagnosis not present

## 2019-02-26 DIAGNOSIS — I130 Hypertensive heart and chronic kidney disease with heart failure and stage 1 through stage 4 chronic kidney disease, or unspecified chronic kidney disease: Secondary | ICD-10-CM | POA: Diagnosis not present

## 2019-02-26 DIAGNOSIS — Z923 Personal history of irradiation: Secondary | ICD-10-CM | POA: Diagnosis not present

## 2019-02-26 DIAGNOSIS — Z953 Presence of xenogenic heart valve: Secondary | ICD-10-CM | POA: Diagnosis not present

## 2019-02-26 DIAGNOSIS — I10 Essential (primary) hypertension: Secondary | ICD-10-CM | POA: Insufficient documentation

## 2019-02-26 DIAGNOSIS — Z79899 Other long term (current) drug therapy: Secondary | ICD-10-CM | POA: Insufficient documentation

## 2019-02-26 DIAGNOSIS — F329 Major depressive disorder, single episode, unspecified: Secondary | ICD-10-CM | POA: Diagnosis not present

## 2019-02-26 DIAGNOSIS — I5032 Chronic diastolic (congestive) heart failure: Secondary | ICD-10-CM | POA: Insufficient documentation

## 2019-02-26 DIAGNOSIS — K432 Incisional hernia without obstruction or gangrene: Secondary | ICD-10-CM | POA: Insufficient documentation

## 2019-02-26 DIAGNOSIS — Z20828 Contact with and (suspected) exposure to other viral communicable diseases: Secondary | ICD-10-CM | POA: Diagnosis not present

## 2019-02-26 DIAGNOSIS — Z7982 Long term (current) use of aspirin: Secondary | ICD-10-CM | POA: Insufficient documentation

## 2019-02-26 DIAGNOSIS — Z01818 Encounter for other preprocedural examination: Secondary | ICD-10-CM | POA: Insufficient documentation

## 2019-02-26 DIAGNOSIS — Z87891 Personal history of nicotine dependence: Secondary | ICD-10-CM | POA: Insufficient documentation

## 2019-02-26 DIAGNOSIS — Z955 Presence of coronary angioplasty implant and graft: Secondary | ICD-10-CM | POA: Insufficient documentation

## 2019-02-26 DIAGNOSIS — Z01812 Encounter for preprocedural laboratory examination: Secondary | ICD-10-CM | POA: Insufficient documentation

## 2019-02-26 DIAGNOSIS — I2510 Atherosclerotic heart disease of native coronary artery without angina pectoris: Secondary | ICD-10-CM | POA: Insufficient documentation

## 2019-02-26 DIAGNOSIS — N183 Chronic kidney disease, stage 3 unspecified: Secondary | ICD-10-CM | POA: Insufficient documentation

## 2019-02-26 DIAGNOSIS — E785 Hyperlipidemia, unspecified: Secondary | ICD-10-CM | POA: Diagnosis not present

## 2019-02-26 LAB — BASIC METABOLIC PANEL
Anion gap: 10 (ref 5–15)
BUN: 24 mg/dL — ABNORMAL HIGH (ref 8–23)
CO2: 25 mmol/L (ref 22–32)
Calcium: 9.2 mg/dL (ref 8.9–10.3)
Chloride: 102 mmol/L (ref 98–111)
Creatinine, Ser: 1.08 mg/dL — ABNORMAL HIGH (ref 0.44–1.00)
GFR calc Af Amer: 52 mL/min — ABNORMAL LOW (ref >60)
GFR calc non Af Amer: 45 mL/min — ABNORMAL LOW (ref >60)
Glucose, Bld: 100 mg/dL — ABNORMAL HIGH (ref 70–99)
Potassium: 4.1 mmol/L (ref 3.5–5.1)
Sodium: 137 mmol/L (ref 135–145)

## 2019-02-26 LAB — CBC
HCT: 36.3 % (ref 36.0–46.0)
Hemoglobin: 11.4 g/dL — ABNORMAL LOW (ref 12.0–15.0)
MCH: 29.6 pg (ref 26.0–34.0)
MCHC: 31.4 g/dL (ref 30.0–36.0)
MCV: 94.3 fL (ref 80.0–100.0)
Platelets: 226 10*3/uL (ref 150–400)
RBC: 3.85 MIL/uL — ABNORMAL LOW (ref 3.87–5.11)
RDW: 13.2 % (ref 11.5–15.5)
WBC: 8.1 10*3/uL (ref 4.0–10.5)
nRBC: 0 % (ref 0.0–0.2)

## 2019-02-26 NOTE — Progress Notes (Signed)
PCP - Dr. Iven Finn Cardiologist - Dr. Linard Millers  Chest x-ray - 07/29/17 EKG - 01/19/19 Stress Test - no ECHO - 10/26/18 Cardiac Cath - 07/05/17  Sleep Study - no CPAP -   Fasting Blood Sugar - NA Checks Blood Sugar _____ times a day  Blood Thinner Instructions: takes ASA 81 mg  irregularly Aspirin Instructions:none from Dr. Harlow Asa Last Dose:can't remember  Anesthesia review:   Patient denies shortness of breath, fever, cough and chest pain at PAT appointment yes  Patient verbalized understanding of instructions that were given to them at the PAT appointment. Patient was also instructed that they will need to review over the PAT instructions again at home before surgery. yes

## 2019-02-27 LAB — NOVEL CORONAVIRUS, NAA (HOSP ORDER, SEND-OUT TO REF LAB; TAT 18-24 HRS): SARS-CoV-2, NAA: NOT DETECTED

## 2019-02-27 NOTE — Anesthesia Preprocedure Evaluation (Addendum)
Anesthesia Evaluation  Patient identified by MRN, date of birth, ID band Patient awake    Reviewed: Allergy & Precautions, H&P , NPO status , Patient's Chart, lab work & pertinent test results  Airway Mallampati: II   Neck ROM: full    Dental   Pulmonary former smoker,    breath sounds clear to auscultation       Cardiovascular hypertension, + CAD and + Cardiac Stents  + Valvular Problems/Murmurs AS  Rhythm:regular Rate:Normal  S/p TAVR   Neuro/Psych PSYCHIATRIC DISORDERS Anxiety Depression    GI/Hepatic GERD  ,  Endo/Other  Hypothyroidism   Renal/GU Renal InsufficiencyRenal disease     Musculoskeletal  (+) Arthritis ,   Abdominal   Peds  Hematology   Anesthesia Other Findings   Reproductive/Obstetrics                            Anesthesia Physical Anesthesia Plan  ASA: III  Anesthesia Plan: General   Post-op Pain Management:    Induction: Intravenous  PONV Risk Score and Plan: 3 and Ondansetron, Dexamethasone and Treatment may vary due to age or medical condition  Airway Management Planned: Oral ETT  Additional Equipment:   Intra-op Plan:   Post-operative Plan: Extubation in OR  Informed Consent: I have reviewed the patients History and Physical, chart, labs and discussed the procedure including the risks, benefits and alternatives for the proposed anesthesia with the patient or authorized representative who has indicated his/her understanding and acceptance.       Plan Discussed with: CRNA, Anesthesiologist and Surgeon  Anesthesia Plan Comments: (See PAT note 02/26/2019, Konrad Felix, PA-C)       Anesthesia Quick Evaluation

## 2019-02-27 NOTE — Progress Notes (Signed)
Anesthesia Chart Review   Case: [id number] Date/Time: 03/01/19 0815   Procedure: OPEN INCISIONAL HERNIA REPAIR WITH MESH (N/A )   Anesthesia type: General   Pre-op diagnosis: INCISIONAL HERNIA   Location: [facility]OM 05 / [facility]   Surgeons: Armandina Gemma, MD      DISCUSSION:85 y.o. former smoker (10 pack years, quit 03/15/64) with h/o CKD Stage III, hypothyroidism, HTN, CAD (s/p PCI to RCA 2010), chronic diastolic CHF, AS s/p TAVR 07/2017, incisional hernia scheduled for above procedure 03/01/2019 with Dr. Armandina Gemma.   Last seen by cardiologist, Dr. Daneen Schick, 01/19/2019.  Per OV note, "The patient is stable from cardiac standpoint.  She is elderly and therefore is at increased risk for any surgical procedure.  There are no cardiac contraindications to the upcoming surgical procedure."  Anticipate pt can proceed with planned procedure barring acute status change.   VS: BP (!) 141/82   Pulse 97   Temp 36.5 C (Oral)   Resp 20   Ht 4\' 11"  (1.499 m)   Wt 81.6 kg   SpO2 97%   BMI 36.36 kg/m   PROVIDERS: Ernestene Kiel, MD is PCP   Daneen Schick, MD is Cardiologist  LABS: Labs reviewed: Acceptable for surgery. (all labs ordered are listed, but only abnormal results are displayed)  Labs Reviewed  CBC - Abnormal; Notable for the following components:      Result Value   RBC 3.85 (*)    Hemoglobin 11.4 (*)    All other components within normal limits  BASIC METABOLIC PANEL - Abnormal; Notable for the following components:   Glucose, Bld 100 (*)    BUN 24 (*)    Creatinine, Ser 1.08 (*)    GFR calc non Af Amer 45 (*)    GFR calc Af Amer 52 (*)    All other components within normal limits     IMAGES:   EKG: 01/19/2019 Rate 76 bpm  Sinus rhythm with occasional premature ventricular complexes Rightward axis  Incomplete right bundle branch block   CV: Echo 10/26/2018 IMPRESSIONS   1. The left ventricle has low normal systolic function, with an ejection fraction of  50-55%. The cavity size was normal. There is moderate asymmetric left ventricular hypertrophy. Left ventricular diastolic Doppler parameters are consistent with  pseudonormalization. Elevated left ventricular end-diastolic pressure.  2. The right ventricle has normal systolic function. The cavity was normal. There is no increase in right ventricular wall thickness.  3. Mild thickening of the mitral valve leaflet. Mild calcification of the mitral valve leaflet. There is moderate mitral annular calcification present.  4. The aorta is normal unless otherwise noted.  5. - TAVR: Post TAVR with 26 mm Sapien 3 no PVL and similar gradients to June 2019 mean 8 to 9 mmHg peak 14 to 18 mmHg. Past Medical History:  Diagnosis Date  . Aortic stenosis, severe    a. 07/2017: s/p TAVR w/ an Edwards Sapien 3 THV (size 26 mm, model # [id number], serial # [id number])  . CAD (coronary artery disease)    a. 2010: s/p stent to RCA  . Chronic kidney disease, stage III (moderate)   . Chronic sinusitis   . Depressive disorder   . History of breast cancer    a. s/p lumpectomies and XRT  . Hyperlipemia   . Hypertension   . Hypothyroid   . Insomnia   . Memory loss   . Osteoarthritis   . Reflux esophagitis     Past Surgical History:  Procedure Laterality Date  . ANGIOPLASTY    . BREAST LUMPECTOMY     x2  . FOOT TENDON SURGERY    . GALLBLADDER SURGERY    . INTRAOPERATIVE TRANSTHORACIC ECHOCARDIOGRAM N/A 08/02/2017   Procedure: INTRAOPERATIVE TRANSTHORACIC ECHOCARDIOGRAM;  Surgeon: Burnell Blanks, MD;  Location: [place];  Service: Open Heart Surgery;  Laterality: N/A;  . REPLACEMENT TOTAL KNEE BILATERAL    . RIGHT/LEFT HEART CATH AND CORONARY ANGIOGRAPHY N/A 07/05/2017   Procedure: RIGHT/LEFT HEART CATH AND CORONARY ANGIOGRAPHY;  Surgeon: Belva Crome, MD;  Location: [place] [facility];  Service: Cardiovascular;  Laterality: N/A;  . SKIN CANCER EXCISION  2018   right nostril   . TOTAL ABDOMINAL  HYSTERECTOMY    . TRANSCATHETER AORTIC VALVE REPLACEMENT, TRANSFEMORAL N/A 08/02/2017   Procedure: TRANSCATHETER AORTIC VALVE REPLACEMENT, TRANSFEMORAL;  Surgeon: Burnell Blanks, MD;  Location: [place];  Service: Open Heart Surgery;  Laterality: N/A;    MEDICATIONS: . acetaminophen (TYLENOL) 500 MG tablet  . ALPRAZolam (XANAX) 0.25 MG tablet  . amLODipine (NORVASC) 2.5 MG tablet  . amoxicillin (AMOXIL) 500 MG capsule  . cholecalciferol (VITAMIN D3) 25 MCG (1000 UT) tablet  . citalopram (CELEXA) 10 MG tablet  . diclofenac (VOLTAREN) 75 MG EC tablet  . eszopiclone (LUNESTA) 2 MG TABS tablet  . hydrochlorothiazide (MICROZIDE) 12.5 MG capsule  . HYDROcodone-acetaminophen (NORCO/VICODIN) 5-325 MG tablet  . levothyroxine (SYNTHROID, LEVOTHROID) 50 MCG tablet  . losartan (COZAAR) 25 MG tablet  . metoprolol succinate (TOPROL-XL) 25 MG 24 hr tablet  . montelukast (SINGULAIR) 10 MG tablet  . olmesartan (BENICAR) 40 MG tablet  . polycarbophil (FIBERCON) 625 MG tablet  . vitamin B-12 (CYANOCOBALAMIN) 1000 MCG tablet   No current facility-administered medications for this encounter.     Maia Plan [facility]Surgical Testing [phone] 02/27/19  2:20 PM

## 2019-02-28 ENCOUNTER — Other Ambulatory Visit (HOSPITAL_COMMUNITY): Payer: Medicare Other

## 2019-03-01 ENCOUNTER — Ambulatory Visit (HOSPITAL_COMMUNITY): Payer: Medicare Other | Admitting: Physician Assistant

## 2019-03-01 ENCOUNTER — Encounter (HOSPITAL_COMMUNITY): Payer: Self-pay | Admitting: Surgery

## 2019-03-01 ENCOUNTER — Ambulatory Visit (HOSPITAL_COMMUNITY)
Admission: RE | Admit: 2019-03-01 | Discharge: 2019-03-01 | Disposition: A | Discharge status: Home / Self Care | Payer: Medicare Other | Attending: Surgery | Admitting: Surgery

## 2019-03-01 ENCOUNTER — Ambulatory Visit (HOSPITAL_COMMUNITY): Payer: Medicare Other | Admitting: Anesthesiology

## 2019-03-01 ENCOUNTER — Encounter (HOSPITAL_COMMUNITY)
Admission: RE | Disposition: A | Discharge status: Home / Self Care | Payer: Self-pay | Source: Home / Self Care | Attending: Surgery

## 2019-03-01 DIAGNOSIS — Z87891 Personal history of nicotine dependence: Secondary | ICD-10-CM | POA: Insufficient documentation

## 2019-03-01 DIAGNOSIS — M1990 Unspecified osteoarthritis, unspecified site: Secondary | ICD-10-CM | POA: Insufficient documentation

## 2019-03-01 DIAGNOSIS — Z91040 Latex allergy status: Secondary | ICD-10-CM | POA: Insufficient documentation

## 2019-03-01 DIAGNOSIS — F329 Major depressive disorder, single episode, unspecified: Secondary | ICD-10-CM | POA: Insufficient documentation

## 2019-03-01 DIAGNOSIS — K219 Gastro-esophageal reflux disease without esophagitis: Secondary | ICD-10-CM | POA: Insufficient documentation

## 2019-03-01 DIAGNOSIS — Z7982 Long term (current) use of aspirin: Secondary | ICD-10-CM | POA: Diagnosis not present

## 2019-03-01 DIAGNOSIS — E039 Hypothyroidism, unspecified: Secondary | ICD-10-CM | POA: Diagnosis not present

## 2019-03-01 DIAGNOSIS — Z888 Allergy status to other drugs, medicaments and biological substances status: Secondary | ICD-10-CM | POA: Insufficient documentation

## 2019-03-01 DIAGNOSIS — F419 Anxiety disorder, unspecified: Secondary | ICD-10-CM | POA: Insufficient documentation

## 2019-03-01 DIAGNOSIS — I10 Essential (primary) hypertension: Secondary | ICD-10-CM | POA: Diagnosis not present

## 2019-03-01 DIAGNOSIS — I2510 Atherosclerotic heart disease of native coronary artery without angina pectoris: Secondary | ICD-10-CM | POA: Insufficient documentation

## 2019-03-01 DIAGNOSIS — Z79899 Other long term (current) drug therapy: Secondary | ICD-10-CM | POA: Diagnosis not present

## 2019-03-01 DIAGNOSIS — Z90710 Acquired absence of both cervix and uterus: Secondary | ICD-10-CM | POA: Insufficient documentation

## 2019-03-01 DIAGNOSIS — Z952 Presence of prosthetic heart valve: Secondary | ICD-10-CM | POA: Insufficient documentation

## 2019-03-01 DIAGNOSIS — K430 Incisional hernia with obstruction, without gangrene: Secondary | ICD-10-CM | POA: Diagnosis not present

## 2019-03-01 DIAGNOSIS — K432 Incisional hernia without obstruction or gangrene: Secondary | ICD-10-CM | POA: Diagnosis present

## 2019-03-01 HISTORY — PX: INCISIONAL HERNIA REPAIR: SHX193

## 2019-03-01 HISTORY — PX: INSERTION OF MESH: SHX5868

## 2019-03-01 SURGERY — REPAIR, HERNIA, INCISIONAL
Anesthesia: General | Site: Abdomen

## 2019-03-01 MED ORDER — DEXAMETHASONE SODIUM PHOSPHATE 10 MG/ML IJ SOLN
INTRAMUSCULAR | Status: DC | PRN
  Administered 2019-03-01: 10 mg via INTRAVENOUS

## 2019-03-01 MED ORDER — ROCURONIUM BROMIDE 10 MG/ML (PF) SYRINGE
PREFILLED_SYRINGE | INTRAVENOUS | Status: AC
  Filled 2019-03-01: qty 10

## 2019-03-01 MED ORDER — MIDAZOLAM HCL 2 MG/2ML IJ SOLN: 1.0000 mg | INTRAMUSCULAR | Status: DC

## 2019-03-01 MED ORDER — ONDANSETRON HCL 4 MG/2ML IJ SOLN
INTRAMUSCULAR | Status: DC | PRN
  Administered 2019-03-01: 4 mg via INTRAVENOUS

## 2019-03-01 MED ORDER — LACTATED RINGERS IV SOLN: INTRAVENOUS | Status: DC

## 2019-03-01 MED ORDER — ROCURONIUM BROMIDE 10 MG/ML (PF) SYRINGE
PREFILLED_SYRINGE | INTRAVENOUS | Status: DC | PRN
  Administered 2019-03-01: 50 mg via INTRAVENOUS

## 2019-03-01 MED ORDER — ONDANSETRON HCL 4 MG/2ML IJ SOLN: 4.0000 mg | Freq: Four times a day (QID) | INTRAMUSCULAR | Status: DC | PRN

## 2019-03-01 MED ORDER — 0.9 % SODIUM CHLORIDE (POUR BTL) OPTIME
TOPICAL | Status: DC | PRN
  Administered 2019-03-01: 08:00:00 1000 mL

## 2019-03-01 MED ORDER — LIDOCAINE 2% (20 MG/ML) 5 ML SYRINGE
INTRAMUSCULAR | Status: DC | PRN
  Administered 2019-03-01: 75 mg via INTRAVENOUS

## 2019-03-01 MED ORDER — OXYCODONE HCL 5 MG PO TABS
5.0000 mg | ORAL_TABLET | Freq: Once | ORAL | Status: AC | PRN
  Administered 2019-03-01: 5 mg via ORAL

## 2019-03-01 MED ORDER — FENTANYL CITRATE (PF) 100 MCG/2ML IJ SOLN
INTRAMUSCULAR | Status: AC
  Filled 2019-03-01: qty 2

## 2019-03-01 MED ORDER — SUGAMMADEX SODIUM 200 MG/2ML IV SOLN
INTRAVENOUS | Status: DC | PRN
  Administered 2019-03-01: 180 mg via INTRAVENOUS

## 2019-03-01 MED ORDER — FENTANYL CITRATE (PF) 100 MCG/2ML IJ SOLN
25.0000 ug | INTRAMUSCULAR | Status: DC | PRN
  Administered 2019-03-01 (×2): 50 ug via INTRAVENOUS

## 2019-03-01 MED ORDER — DEXAMETHASONE SODIUM PHOSPHATE 10 MG/ML IJ SOLN
INTRAMUSCULAR | Status: AC
  Filled 2019-03-01: qty 1

## 2019-03-01 MED ORDER — LIDOCAINE 2% (20 MG/ML) 5 ML SYRINGE
INTRAMUSCULAR | Status: AC
  Filled 2019-03-01: qty 5

## 2019-03-01 MED ORDER — LABETALOL HCL 5 MG/ML IV SOLN
INTRAVENOUS | Status: AC
  Administered 2019-03-01: 10 mg
  Filled 2019-03-01: qty 4

## 2019-03-01 MED ORDER — MIDAZOLAM HCL 2 MG/2ML IJ SOLN
INTRAMUSCULAR | Status: AC
  Filled 2019-03-01: qty 2

## 2019-03-01 MED ORDER — CHLORHEXIDINE GLUCONATE CLOTH 2 % EX PADS: 6.0000 | MEDICATED_PAD | Freq: Once | CUTANEOUS | Status: DC

## 2019-03-01 MED ORDER — OXYCODONE HCL 5 MG/5ML PO SOLN: 5.0000 mg | Freq: Once | ORAL | Status: AC | PRN

## 2019-03-01 MED ORDER — TRAMADOL HCL 50 MG PO TABS: 50.0000 mg | ORAL_TABLET | Freq: Four times a day (QID) | ORAL | 0 refills | Status: DC | PRN

## 2019-03-01 MED ORDER — BUPIVACAINE HCL (PF) 0.5 % IJ SOLN
INTRAMUSCULAR | Status: AC
  Filled 2019-03-01: qty 30

## 2019-03-01 MED ORDER — BUPIVACAINE HCL 0.5 % IJ SOLN
INTRAMUSCULAR | Status: DC | PRN
  Administered 2019-03-01: 30 mL

## 2019-03-01 MED ORDER — OXYCODONE HCL 5 MG PO TABS
ORAL_TABLET | ORAL | Status: AC
  Filled 2019-03-01: qty 1

## 2019-03-01 MED ORDER — PROPOFOL 10 MG/ML IV BOLUS
INTRAVENOUS | Status: AC
  Filled 2019-03-01: qty 20

## 2019-03-01 MED ORDER — FENTANYL CITRATE (PF) 250 MCG/5ML IJ SOLN
INTRAMUSCULAR | Status: DC | PRN
  Administered 2019-03-01 (×4): 50 ug via INTRAVENOUS

## 2019-03-01 MED ORDER — CEFAZOLIN SODIUM-DEXTROSE 2-4 GM/100ML-% IV SOLN
2.0000 g | INTRAVENOUS | Status: AC
  Administered 2019-03-01: 09:00:00 2 g via INTRAVENOUS

## 2019-03-01 MED ORDER — ONDANSETRON HCL 4 MG/2ML IJ SOLN
INTRAMUSCULAR | Status: AC
  Filled 2019-03-01: qty 2

## 2019-03-01 MED ORDER — CEFAZOLIN SODIUM-DEXTROSE 2-4 GM/100ML-% IV SOLN
INTRAVENOUS | Status: AC
  Filled 2019-03-01: qty 100

## 2019-03-01 MED ORDER — PROPOFOL 10 MG/ML IV BOLUS
INTRAVENOUS | Status: DC | PRN
  Administered 2019-03-01: 100 mg via INTRAVENOUS

## 2019-03-01 MED ORDER — FENTANYL CITRATE (PF) 100 MCG/2ML IJ SOLN: 50.0000 ug | INTRAMUSCULAR | Status: DC

## 2019-03-01 SURGICAL SUPPLY — 26 items
BINDER ABDOMINAL 12 ML 46-62 (SOFTGOODS) IMPLANT
CHLORAPREP W/TINT 26 (MISCELLANEOUS) ×3 IMPLANT
COVER SURGICAL LIGHT HANDLE (MISCELLANEOUS) ×3 IMPLANT
COVER WAND RF STERILE (DRAPES) IMPLANT
DERMABOND ADVANCED (GAUZE/BANDAGES/DRESSINGS) ×2
DERMABOND ADVANCED .7 DNX12 (GAUZE/BANDAGES/DRESSINGS) IMPLANT
DRAPE LAPAROSCOPIC ABDOMINAL (DRAPES) ×3 IMPLANT
ELECT REM PT RETURN 15FT ADLT (MISCELLANEOUS) ×3 IMPLANT
GAUZE SPONGE 4X4 12PLY STRL (GAUZE/BANDAGES/DRESSINGS) ×1 IMPLANT
GLOVE SURG ORTHO 8.0 STRL STRW (GLOVE) ×1 IMPLANT
GOWN STRL REUS W/TWL LRG LVL3 (GOWN DISPOSABLE) ×3 IMPLANT
GOWN STRL REUS W/TWL XL LVL3 (GOWN DISPOSABLE) ×4 IMPLANT
KIT BASIN OR (CUSTOM PROCEDURE TRAY) ×3 IMPLANT
KIT TURNOVER KIT A (KITS) IMPLANT
MESH VENTRALEX ST 2.5 CRC MED (Mesh General) ×2 IMPLANT
NS IRRIG 1000ML POUR BTL (IV SOLUTION) ×3 IMPLANT
PACK GENERAL/GYN (CUSTOM PROCEDURE TRAY) ×3 IMPLANT
PENCIL SMOKE EVACUATOR (MISCELLANEOUS) IMPLANT
STAPLER VISISTAT 35W (STAPLE) ×1 IMPLANT
SUT MNCRL AB 4-0 PS2 18 (SUTURE) ×2 IMPLANT
SUT NOVA 1 T20/GS 25DT (SUTURE) IMPLANT
SUT NOVA NAB GS-21 0 18 T12 DT (SUTURE) ×4 IMPLANT
SUT VIC AB 2-0 CT2 27 (SUTURE) ×1 IMPLANT
SUT VIC AB 3-0 SH 18 (SUTURE) ×2 IMPLANT
TOWEL OR 17X26 10 PK STRL BLUE (TOWEL DISPOSABLE) ×3 IMPLANT
TRAY FOLEY MTR SLVR 16FR STAT (SET/KITS/TRAYS/PACK) ×1 IMPLANT

## 2019-03-01 NOTE — Progress Notes (Addendum)
Give Labetalol 10 mg IV x1 in pacr, per Dr Marcie Bal.   Pt may be discharged with baseline SBP> 180

## 2019-03-01 NOTE — Op Note (Signed)
Operative Note  Pre-operative Diagnosis:  Ventral incisional hernia  Post-operative Diagnosis:  same  Surgeon:  Armandina Gemma, MD  Assistant:  none   Procedure:  Open repair ventral incisional hernia with mesh patch (Ventralex ST 6.4 cm round)  Anesthesia:  general  Estimated Blood Loss:  minimal  Drains: none         Specimen: none  Indications:  Patient is referred by Dr. Verdell Face for surgical evaluation and management of an incisional hernia. Patient's cardiologist is Dr. Daneen Schick. Patient had undergone an open cholecystectomy by Dr. Marylene Buerger many years ago. Over the past 2 years, the patient had noted a bulge in the upper abdomen which gradually increased in size. It causes mild discomfort. Patient had also had an abdominal hysterectomy. She has noted slow increase in size of the bulge but denies any signs or symptoms of intestinal obstruction. She has had no prior hernia repairs. She presents today for repair of incisional hernia with mesh.  Procedure:  The patient was seen in the pre-op holding area. The risks, benefits, complications, treatment options, and expected outcomes were previously discussed with the patient. The patient agreed with the proposed plan and has signed the informed consent form.  The patient was brought to the operating room by the surgical team, identified as [name] and the procedure verified. A "time out" was completed and the above information confirmed.  Following administration of general anesthesia, the patient is positioned and then prepped and draped in the usual aseptic fashion.  After ascertaining that an adequate level of anesthesia been achieved, the medial aspect of the previous right subcostal incision is reopened with a #15 blade.  This is extended several centimeters medially.  Dissection is carried into the subcutaneous tissues using electrocautery for hemostasis.  Hernia sac is identified.  It is opened.  It  contains incarcerated omentum.  Hernia sac is dissected out down to the fascial defect.  Omentum is reduced back within the peritoneal cavity.  Hernia sac is excised and discarded.  The margins of the fascial defect are then defined circumferentially.  A Ventralex ST 6.4 cm round patch is selected.  It is prepared and inserted into the peritoneal cavity and placed beneath the fascial defect.  The defect is closed in a fashion which incorporates the mesh patch into the closure with interrupted 0 Novafil simple sutures.  Local anesthetic is then infiltrated circumferentially.  Subcutaneous tissues are closed with interrupted 3-0 Vicryl sutures.  Skin is anesthetized with local anesthetic.  Skin edges are reapproximated with a running 4-0 Monocryl subcuticular suture.  Wound is washed and dried and Dermabond is applied as dressing.  Patient is awakened from anesthesia and brought to the recovery room.  The patient tolerated the procedure well.   Armandina Gemma, MD [facility] Office: [phone]

## 2019-03-01 NOTE — Interval H&P Note (Signed)
History and Physical Interval Note:  03/01/2019 8:12 AM  [name]  has presented today for surgery, with the diagnosis of INCISIONAL HERNIA.  The various methods of treatment have been discussed with the patient and family. After consideration of risks, benefits and other options for treatment, the patient has consented to    Procedure(s): OPEN INCISIONAL HERNIA REPAIR (N/A) INSERTION OF MESH (N/A) as a surgical intervention.    The patient's history has been reviewed, patient examined, no change in status, stable for surgery.  I have reviewed the patient's chart and labs.  Questions were answered to the patient's satisfaction.    Armandina Gemma, MD [facility] Office: [place]

## 2019-03-01 NOTE — Anesthesia Procedure Notes (Signed)
Procedure Name: Intubation Date/Time: 03/01/2019 8:32 AM Performed by: Lollie Sails, CRNA Pre-anesthesia Checklist: Patient identified, Emergency Drugs available, Suction available, Patient being monitored and Timeout performed Patient Re-evaluated:Patient Re-evaluated prior to induction Oxygen Delivery Method: Circle system utilized Preoxygenation: Pre-oxygenation with 100% oxygen Induction Type: IV induction Ventilation: Mask ventilation without difficulty Laryngoscope Size: Miller and 2 Grade View: Grade I Tube type: Oral Tube size: 7.0 mm Number of attempts: 1 Airway Equipment and Method: Stylet Placement Confirmation: ETT inserted through vocal cords under direct vision,  positive ETCO2 and breath sounds checked- equal and bilateral Secured at: 22 cm Tube secured with: Tape Dental Injury: Teeth and Oropharynx as per pre-operative assessment

## 2019-03-01 NOTE — Transfer of Care (Signed)
Immediate Anesthesia Transfer of Care Note  Patient: [name]  Procedure(s) Performed: OPEN INCISIONAL HERNIA REPAIR (N/A Abdomen) INSERTION OF MESH (N/A Abdomen)  Patient Location: PACU  Anesthesia Type:General  Level of Consciousness: awake and patient cooperative  Airway & Oxygen Therapy: Patient Spontanous Breathing and Patient connected to face mask oxygen  Post-op Assessment: Report given to RN and Post -op Vital signs reviewed and stable  Post vital signs: Reviewed and stable  Last Vitals:  Vitals Value Taken Time  BP 203/99 03/01/19 0930  Temp    Pulse 81 03/01/19 0932  Resp 36 03/01/19 0932  SpO2 93 % 03/01/19 0932  Vitals shown include unvalidated device data.  Last Pain:  Vitals:   03/01/19 0700  TempSrc:   PainSc: 0-No pain         Complications: No apparent anesthesia complications

## 2019-03-02 ENCOUNTER — Encounter: Payer: Self-pay | Admitting: *Deleted

## 2019-03-02 NOTE — Anesthesia Postprocedure Evaluation (Signed)
Anesthesia Post Note  Patient: [name]  Procedure(s) Performed: OPEN INCISIONAL HERNIA REPAIR (N/A Abdomen) INSERTION OF MESH (N/A Abdomen)     Patient location during evaluation: PACU Anesthesia Type: General Level of consciousness: awake and alert Pain management: pain level controlled Vital Signs Assessment: post-procedure vital signs reviewed and stable Respiratory status: spontaneous breathing, nonlabored ventilation, respiratory function stable and patient connected to nasal cannula oxygen Cardiovascular status: blood pressure returned to baseline and stable Postop Assessment: no apparent nausea or vomiting Anesthetic complications: no    Last Vitals:  Vitals:   03/01/19 1030 03/01/19 1049  BP: 140/72 (!) 158/71  Pulse: 70 75  Resp: 18 16  Temp:  36.9 C  SpO2: 92% 92%    Last Pain:  Vitals:   03/01/19 1030  TempSrc:   PainSc: [name]

## 2019-05-23 ENCOUNTER — Ambulatory Visit (INDEPENDENT_AMBULATORY_CARE_PROVIDER_SITE_OTHER): Payer: Medicare Other | Admitting: Interventional Cardiology

## 2019-05-23 ENCOUNTER — Other Ambulatory Visit: Payer: Self-pay

## 2019-05-23 ENCOUNTER — Encounter: Payer: Self-pay | Admitting: Interventional Cardiology

## 2019-05-23 VITALS — BP 162/84 | HR 65 | Ht 59.0 in | Wt 179.0 lb

## 2019-05-23 DIAGNOSIS — I5032 Chronic diastolic (congestive) heart failure: Secondary | ICD-10-CM | POA: Diagnosis not present

## 2019-05-23 DIAGNOSIS — I10 Essential (primary) hypertension: Secondary | ICD-10-CM | POA: Diagnosis not present

## 2019-05-23 DIAGNOSIS — Z952 Presence of prosthetic heart valve: Secondary | ICD-10-CM | POA: Diagnosis not present

## 2019-05-23 DIAGNOSIS — I25119 Atherosclerotic heart disease of native coronary artery with unspecified angina pectoris: Secondary | ICD-10-CM | POA: Diagnosis not present

## 2019-05-23 DIAGNOSIS — N289 Disorder of kidney and ureter, unspecified: Secondary | ICD-10-CM

## 2019-05-23 DIAGNOSIS — Z7189 Other specified counseling: Secondary | ICD-10-CM

## 2019-05-23 NOTE — Addendum Note (Signed)
Addended by: Carylon Perches on: 05/23/2019 03:45 PM   Modules accepted: Orders

## 2019-05-23 NOTE — Patient Instructions (Signed)
Medication Instructions:  Your physician recommends that you continue on your current medications as directed. Please refer to the Current Medication list given to you today.  *If you need a refill on your cardiac medications before your next appointment, please call your pharmacy*   Lab Work: BMET, Liver, ESR and CBC today  If you have labs (blood work) drawn today and your tests are completely normal, you will receive your results only by: Marland Kitchen MyChart Message (if you have MyChart) OR . A paper copy in the mail If you have any lab test that is abnormal or we need to change your treatment, we will call you to review the results.   Testing/Procedures: None   Follow-Up: At [facility], you and your health needs are our priority.  As part of our continuing mission to provide you with exceptional heart care, we have created designated Provider Care Teams.  These Care Teams include your primary Cardiologist (physician) and Advanced Practice Providers (APPs -  Physician Assistants and Nurse Practitioners) who all work together to provide you with the care you need, when you need it.  We recommend signing up for the patient portal called "MyChart".  Sign up information is provided on this After Visit Summary.  MyChart is used to connect with patients for Virtual Visits (Telemedicine).  Patients are able to view lab/test results, encounter notes, upcoming appointments, etc.  Non-urgent messages can be sent to your provider as well.   To learn more about what you can do with MyChart, go to [email or web address].    Your next appointment:   12 month(s)  The format for your next appointment:   In Person  Provider:   You may see [name], MD or one of the following Advanced Practice Providers on your designated Care Team:    Truitt Merle, NP  Cecilie Kicks, NP  Kathyrn Drown, NP    Other Instructions

## 2019-05-23 NOTE — Progress Notes (Signed)
Cardiology Office Note:    Date:  05/23/2019   ID:  [name], DOB 05-27-1925, MRN [id number]  PCP:  Ernestene Kiel, MD  Cardiologist:  [name], MD   Referring MD: Ernestene Kiel, MD   Chief Complaint  Patient presents with  . Shortness of Breath  . Coronary Artery Disease    History of Present Illness:    [name] is a 86 y.o. female with a hx of CAD s/p PCI to RCA in 2010, CKD III, HLD, HTN, hypothyroidism, breast cancer s/p lumpectomy and radiation, chronic diastolic CHF, and severe ASs/p TAVR (08/02/17).  Since Friday, the patient has been feeling sluggish, no energy, arms and legs feel heavy, difficulty ambulating, and difficulty standing from a sitting position.  Appetite is been okay.  Breathing is not been different than usual.  She denies orthopnea.  She has not noticed blood in her urine or stool.  She denies chest pain and palpitations.  There has been no change in her overall cognitive function according to the daughter who is a close observer.  She has no difficulty swallowing.  Overall "I just do not feel well".   Past Medical History:  Diagnosis Date  . Aortic stenosis, severe    a. 07/2017: s/p TAVR w/ an Edwards Sapien 3 THV (size 26 mm, model # [id number], serial # [id number])  . CAD (coronary artery disease)    a. 2010: s/p stent to RCA  . Chronic kidney disease, stage III (moderate)   . Chronic sinusitis   . Depressive disorder   . History of breast cancer    a. s/p lumpectomies and XRT  . Hyperlipemia   . Hypertension   . Hypothyroid   . Insomnia   . Memory loss   . Osteoarthritis   . Reflux esophagitis     Past Surgical History:  Procedure Laterality Date  . ANGIOPLASTY    . BREAST LUMPECTOMY     x2  . FOOT TENDON SURGERY    . GALLBLADDER SURGERY    . INCISIONAL HERNIA REPAIR N/A 03/01/2019   Procedure: OPEN INCISIONAL HERNIA REPAIR;  Surgeon: Armandina Gemma, MD;  [facility];  Service: General;   Laterality: N/A;  . INSERTION OF MESH N/A 03/01/2019   Procedure: INSERTION OF MESH;  Surgeon: Armandina Gemma, MD;  [facility];  Service: General;  Laterality: N/A;  . INTRAOPERATIVE TRANSTHORACIC ECHOCARDIOGRAM N/A 08/02/2017   Procedure: INTRAOPERATIVE TRANSTHORACIC ECHOCARDIOGRAM;  Surgeon: Burnell Blanks, MD;  Location: [place];  Service: Open Heart Surgery;  Laterality: N/A;  . REPLACEMENT TOTAL KNEE BILATERAL    . RIGHT/LEFT HEART CATH AND CORONARY ANGIOGRAPHY N/A 07/05/2017   Procedure: RIGHT/LEFT HEART CATH AND CORONARY ANGIOGRAPHY;  Surgeon: Belva Crome, MD;  Location: [place] [facility];  Service: Cardiovascular;  Laterality: N/A;  . SKIN CANCER EXCISION  2018   right nostril   . TOTAL ABDOMINAL HYSTERECTOMY    . TRANSCATHETER AORTIC VALVE REPLACEMENT, TRANSFEMORAL N/A 08/02/2017   Procedure: TRANSCATHETER AORTIC VALVE REPLACEMENT, TRANSFEMORAL;  Surgeon: Burnell Blanks, MD;  Location: [place];  Service: Open Heart Surgery;  Laterality: N/A;    Current Medications: Current Meds  Medication Sig  . acetaminophen (TYLENOL) 500 MG tablet Take 1,000 mg by mouth daily as needed (sinus headaches).  . ALPRAZolam (XANAX) 0.25 MG tablet Take 0.25 mg by mouth 3 (three) times daily as needed for anxiety or sleep.   Marland Kitchen amLODipine (NORVASC) 2.5 MG tablet TAKE ONE (  1) TABLET BY MOUTH ONCE DAILY  . amoxicillin (AMOXIL) 500 MG capsule Take 4 capsules by mouth as needed 1 hour prior to dental procedures  . cholecalciferol (VITAMIN D3) 25 MCG (1000 UT) tablet Take 1,000 Units by mouth daily.  . diclofenac (VOLTAREN) 75 MG EC tablet TAKE ONE (1) TABLET BY MOUTH TWO (2) TIMES DAILY  . eszopiclone (LUNESTA) 2 MG TABS tablet Take 1-2 mg by mouth at bedtime.   . fluconazole (DIFLUCAN) 100 MG tablet Take 100 mg by mouth daily.  Marland Kitchen HYDROcodone-acetaminophen (NORCO/VICODIN) 5-325 MG tablet Take 1-2 tablets by mouth every 6 (six) hours as needed for moderate pain.  Marland Kitchen levothyroxine  (SYNTHROID, LEVOTHROID) 50 MCG tablet Take 50 mcg by mouth daily before breakfast.   . losartan (COZAAR) 25 MG tablet Take 25-50 mg by mouth daily as needed (High BP).  . metoprolol succinate (TOPROL-XL) 25 MG 24 hr tablet Take 25 mg by mouth daily.   . montelukast (SINGULAIR) 10 MG tablet Take 10 mg by mouth at bedtime.  Marland Kitchen olmesartan (BENICAR) 40 MG tablet Take 1 tablet (40 mg total) by mouth daily.  . polycarbophil (FIBERCON) 625 MG tablet Take 625 mg by mouth daily.  . vitamin B-12 (CYANOCOBALAMIN) 1000 MCG tablet Take 1,000 mcg by mouth daily.     Allergies:   Betadine [povidone iodine], Latex, and Zestril [lisinopril]   Social History   Socioeconomic History  . Marital status: Widowed    Spouse name: Not on file  . Number of children: 3  . Years of education: Not on file  . Highest education level: Not on file  Occupational History  . 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 Smoking status: Former Smoker    Packs/day: 0.50    Years: 20.00    Pack years: 10.00    Types: Cigarettes    Quit date: 03/15/1964    Years since quitting: 55.2  . Smokeless tobacco: Never Used  Substance and Sexual Activity  . Alcohol use: Yes    Comment: social  . Drug use: No  . Sexual activity: Not on file  Other Topics Concern  . Not on file  Social History Narrative  . Not on file   Social Determinants of Health   Financial Resource Strain:   . Difficulty of Paying Living Expenses: Not on file  Food Insecurity:   . Worried About Charity fundraiser in the Last Year: Not on file  . Ran Out of Food in the Last Year: Not on file  Transportation Needs:   . Lack of Transportation (Medical): Not on file  . Lack of Transportation (Non-Medical): Not on file  Physical Activity:   . Days of Exercise per Week: Not on file  . Minutes of Exercise per Session: Not on file  Stress:   . Feeling of Stress : Not on file  Social Connections:   . Frequency of  Communication with Friends and Family: Not on file  . Frequency of Social Gatherings with Friends and Family: Not on file  . Attends Religious Services: Not on file  . Active Member of Clubs or Organizations: Not on file  . Attends Archivist Meetings: Not on file  . Marital Status: Not on file     Family History: The patient's family history includes Breast cancer in her sister; Heart disease in her father and maternal grandfather; Stroke in her mother.  ROS:   Please see the history of present illness.  No energy.  Able to carry on a coherent conversation.  All other systems reviewed and are negative.  EKGs/Labs/Other Studies Reviewed:    The following studies were reviewed today:  2D Doppler echocardiogram 10/26/2018: IMPRESSIONS    1. The left ventricle has low normal systolic function, with an ejection  fraction of 50-55%. The cavity size was normal. There is moderate  asymmetric left ventricular hypertrophy. Left ventricular diastolic  Doppler parameters are consistent with  pseudonormalization. Elevated left ventricular end-diastolic pressure.  2. The right ventricle has normal systolic function. The cavity was  normal. There is no increase in right ventricular wall thickness.  3. Mild thickening of the mitral valve leaflet. Mild calcification of the  mitral valve leaflet. There is moderate mitral annular calcification  present.  4. The aorta is normal unless otherwise noted.  5. - TAVR: Post TAVR with 26 mm Sapien 3 no PVL and similar gradients to  June 2019 mean 8 to 9 mmHg peak 14 to 18 mmHg.     EKG:  EKG normal sinus rhythm, indeterminate axis, biatrial abnormality, first-degree AV block, incomplete right bundle.  No acute change compared to prior.  Recent Labs: 02/26/2019: BUN 24; Creatinine, Ser 1.08; Hemoglobin 11.4; Platelets 226; Potassium 4.1; Sodium 137  Recent Lipid Panel No results found for: CHOL, TRIG, HDL, CHOLHDL, VLDL, LDLCALC,  LDLDIRECT  Physical Exam:    VS:  BP (!) 162/84   Pulse 65   Ht '4\' 11"'$  (1.499 m)   Wt 179 lb (81.2 kg)   SpO2 97%   BMI 36.15 kg/m     Wt Readings from Last 3 Encounters:  05/23/19 179 lb (81.2 kg)  03/01/19 180 lb (81.6 kg)  02/26/19 180 lb (81.6 kg)     GEN: Moderate obesity and compatible with age in appearance.. No acute distress HEENT: Normal NECK: No JVD. LYMPHATICS: No lymphadenopathy CARDIAC: Soft 1/6 right upper sternal border systolic murmur RRR without diastolic murmur, gallop, or edema. VASCULAR:  Normal Pulses. No bruits. RESPIRATORY:  Clear to auscultation without rales, wheezing or rhonchi  ABDOMEN: Soft, non-tender, non-distended, No pulsatile mass, MUSCULOSKELETAL: No deformity  SKIN: Warm and dry NEUROLOGIC:  Alert and oriented x 3 PSYCHIATRIC:  Normal affect   ASSESSMENT:    1. S/P TAVR (transcatheter aortic valve replacement)   2. Chronic diastolic CHF (congestive heart failure) ([place])   3. Coronary artery disease involving native coronary artery of native heart with angina pectoris ([place])   4. Essential hypertension, benign   5. Renal insufficiency   6. Educated about COVID-19 virus infection    PLAN:    In order of problems listed above:  1. Clinically normal function of the TAVR valve based upon auscultation and exam 2. No clinical evidence of volume overload 3. No symptoms that suggest angina pectoris 4. Blood pressure is elevated but at age not excessively so.  Sitting blood pressure 155/75 with heart rate 68 and upon standing, immediate, the blood pressure is 142/70 mmHg with heart rate of 80.  No changes are necessary. 5. C met will be obtained which will also check liver enzymes and give some sense of volume status if kidney function is out of line.  Perhaps she is having weakness and fatigue related to the recently added fluconazole. 6. Etiology of fatigue is uncertain.  Does not appear to be related to heart failure or valve dysfunction.   No demonstrated rhythm disturbance.  Labs are being checked to rule out hepatic, inflammatory, and hematologic abnormality. 7.  COVID-19 vaccine has been received.  Social distancing and mask wearing is being practiced.   Medication Adjustments/Labs and Tests Ordered: Current medicines are reviewed at length with the patient today.  Concerns regarding medicines are outlined above.  No orders of the defined types were placed in this encounter.  No orders of the defined types were placed in this encounter.   There are no Patient Instructions on file for this visit.   Signed, [name], MD  05/23/2019 12:14 PM    [place]

## 2019-05-28 LAB — CBC
Hematocrit: 35.6 % (ref 34.0–46.6)
Hemoglobin: 11.8 g/dL (ref 11.1–15.9)
MCH: 30.2 pg (ref 26.6–33.0)
MCHC: 33.1 g/dL (ref 31.5–35.7)
MCV: 91 fL (ref 79–97)
Platelets: 253 10*3/uL (ref 150–450)
RBC: 3.91 x10E6/uL (ref 3.77–5.28)
RDW: 12.9 % (ref 11.7–15.4)
WBC: 6.9 10*3/uL (ref 3.4–10.8)

## 2019-05-28 LAB — BASIC METABOLIC PANEL
BUN/Creatinine Ratio: 16 (ref 12–28)
BUN: 18 mg/dL (ref 10–36)
CO2: 24 mmol/L (ref 20–29)
Calcium: 9.4 mg/dL (ref 8.7–10.3)
Chloride: 99 mmol/L (ref 96–106)
Creatinine, Ser: 1.16 mg/dL — ABNORMAL HIGH (ref 0.57–1.00)
GFR calc Af Amer: 48 mL/min/{1.73_m2} — ABNORMAL LOW (ref >59)
GFR calc non Af Amer: 41 mL/min/{1.73_m2} — ABNORMAL LOW (ref >59)
Glucose: 82 mg/dL (ref 65–99)
Potassium: 5 mmol/L (ref 3.5–5.2)
Sodium: 137 mmol/L (ref 134–144)

## 2019-05-28 LAB — HEPATIC FUNCTION PANEL
ALT: 24 IU/L (ref 0–32)
AST: 21 IU/L (ref 0–40)
Albumin: 4.6 g/dL (ref 3.5–4.6)
Alkaline Phosphatase: 57 IU/L (ref 39–117)
Bilirubin Total: 0.6 mg/dL (ref 0.0–1.2)
Bilirubin, Direct: 0.14 mg/dL (ref 0.00–0.40)
Total Protein: 6.6 g/dL (ref 6.0–8.5)

## 2019-05-28 LAB — SEDIMENTATION RATE: Sed Rate: 8 mm/hr (ref 0–40)

## 2019-06-21 DIAGNOSIS — C50412 Malignant neoplasm of upper-outer quadrant of left female breast: Secondary | ICD-10-CM

## 2019-08-24 ENCOUNTER — Other Ambulatory Visit: Payer: Self-pay | Admitting: Physician Assistant

## 2019-08-27 ENCOUNTER — Telehealth: Payer: Self-pay | Admitting: Physician Assistant

## 2019-08-27 NOTE — Telephone Encounter (Addendum)
Received a call back from the patient. She said that her daughter helps with her medicines and that I should speak with her.  Jenny Reichmann [phone].  I left message for Jenny Reichmann to call back.

## 2019-08-27 NOTE — Telephone Encounter (Signed)
I called the patient's primary number and left message for a call back. Called pharmacy.  They have been picking up losartan 25 mg every 30 days -- it is a prn only order. Olmesartan was prescribed in March for 90 days and refill due now.  Asked pharmacy to call Dr. Thompson Caul office back if they find out if pt is using losartan at all.

## 2019-08-27 NOTE — Telephone Encounter (Signed)
Follow Up  Patient returning call. Transferred to [employer]

## 2019-08-27 NOTE — Telephone Encounter (Signed)
° ° °  Pt c/o medication issue:  1. Name of Medication:   olmesartan (BENICAR) 40 MG tablet    2. How are you currently taking this medication (dosage and times per day)?   3. Are you having a reaction (difficulty breathing--STAT)?   4. What is your medication issue? Bayley from [facility] calling, she said she wanted to clarify the olmesartan prescription they received since pt already on losartan.

## 2019-08-29 NOTE — Telephone Encounter (Signed)
Daughter's phone number is no longer working and unable to reach pt at pt's phone number Will try later .Adonis Housekeeper

## 2019-08-30 NOTE — Telephone Encounter (Signed)
Thanks

## 2019-08-30 NOTE — Telephone Encounter (Addendum)
Patient's daughter's phone number is not in service. I spoke with the patient's son (DPR) and informed.  He will check and make sure that pt is only taking olmesartan daily and that she only has losartan for as needed.   He will have patient's daughter [name] call back with update.  Will route to Dr. Tamala Julian and his primary nurse to update and to find out if prn losartan is needed or should be discontinued.

## 2019-08-31 ENCOUNTER — Telehealth: Payer: Self-pay | Admitting: Interventional Cardiology

## 2019-08-31 NOTE — Telephone Encounter (Signed)
Follow Up:      Pt is returning a call from yesterday. 

## 2019-08-31 NOTE — Telephone Encounter (Signed)
Pt reports she only takes Losartan PRN for SBP > 150.  Reports that she does not use this very frequently at all.   States that her pharmacy delivers medications to her home.  Pt advised to call pharmacy to discuss their policy of PRN medication delivery so that they do not continue to send her monthly refills on PRN Losartan. (I also called her pharmacy and told them what I instructed her to do) Advised to call the office if she begins to take it more frequently. Patient verbalized understanding and agreeable to plan.

## 2019-11-23 DIAGNOSIS — K136 Irritative hyperplasia of oral mucosa: Secondary | ICD-10-CM | POA: Insufficient documentation

## 2019-11-23 HISTORY — DX: Irritative hyperplasia of oral mucosa: K13.6

## 2019-11-30 ENCOUNTER — Encounter: Payer: Self-pay | Admitting: Oncology

## 2019-12-06 ENCOUNTER — Other Ambulatory Visit: Payer: Self-pay | Admitting: Physician Assistant

## 2019-12-06 NOTE — Telephone Encounter (Signed)
Pt needs to have this filled and followed by her PCP.  This was originally prescribed by the TAVR team due to arthritis but needs to be taken over by PCP.

## 2019-12-06 NOTE — Telephone Encounter (Signed)
Pt's pharmacy is requesting a refill on diclofenac (voltaren) 75 mg tablet. Would Dr. Tamala Julian like to refill this medication? Please address

## 2019-12-23 ENCOUNTER — Encounter: Payer: Self-pay | Admitting: Pharmacist

## 2019-12-31 ENCOUNTER — Other Ambulatory Visit: Payer: Self-pay

## 2019-12-31 ENCOUNTER — Inpatient Hospital Stay: Payer: Medicare Other

## 2019-12-31 ENCOUNTER — Inpatient Hospital Stay: Payer: Medicare Other | Attending: Oncology

## 2019-12-31 VITALS — BP 155/75 | HR 61 | Temp 97.9°F | Resp 18

## 2019-12-31 DIAGNOSIS — Z170 Estrogen receptor positive status [ER+]: Secondary | ICD-10-CM | POA: Insufficient documentation

## 2019-12-31 DIAGNOSIS — C773 Secondary and unspecified malignant neoplasm of axilla and upper limb lymph nodes: Secondary | ICD-10-CM | POA: Diagnosis not present

## 2019-12-31 DIAGNOSIS — C50912 Malignant neoplasm of unspecified site of left female breast: Secondary | ICD-10-CM | POA: Diagnosis present

## 2019-12-31 DIAGNOSIS — Z5111 Encounter for antineoplastic chemotherapy: Secondary | ICD-10-CM | POA: Insufficient documentation

## 2019-12-31 MED ORDER — FULVESTRANT 250 MG/5ML IM SOLN
500.0000 mg | Freq: Once | INTRAMUSCULAR | Status: AC
  Administered 2019-12-31: 500 mg via INTRAMUSCULAR

## 2019-12-31 MED ORDER — FULVESTRANT 250 MG/5ML IM SOLN
INTRAMUSCULAR | Status: AC
  Filled 2019-12-31: qty 10

## 2019-12-31 NOTE — Progress Notes (Signed)
PT IS STABLE AT DISCHARGE. 

## 2019-12-31 NOTE — Patient Instructions (Signed)
Fulvestrant injection What is this medicine? FULVESTRANT (ful VES trant) blocks the effects of estrogen. It is used to treat breast cancer. This medicine may be used for other purposes; ask your health care provider or pharmacist if you have questions. COMMON BRAND NAME(S): FASLODEX What should I tell my health care provider before I take this medicine? They need to know if you have any of these conditions:  bleeding disorders  liver disease  low blood counts, like low white cell, platelet, or red cell counts  an unusual or allergic reaction to fulvestrant, other medicines, foods, dyes, or preservatives  pregnant or trying to get pregnant  breast-feeding How should I use this medicine? This medicine is for injection into a muscle. It is usually given by a health care professional in a hospital or clinic setting. Talk to your pediatrician regarding the use of this medicine in children. Special care may be needed. Overdosage: If you think you have taken too much of this medicine contact a poison control center or emergency room at once. NOTE: This medicine is only for you. Do not share this medicine with others. What if I miss a dose? It is important not to miss your dose. Call your doctor or health care professional if you are unable to keep an appointment. What may interact with this medicine?  medicines that treat or prevent blood clots like warfarin, enoxaparin, dalteparin, apixaban, dabigatran, and rivaroxaban This list may not describe all possible interactions. Give your health care provider a list of all the medicines, herbs, non-prescription drugs, or dietary supplements you use. Also tell them if you smoke, drink alcohol, or use illegal drugs. Some items may interact with your medicine. What should I watch for while using this medicine? Your condition will be monitored carefully while you are receiving this medicine. You will need important blood work done while you are taking  this medicine. Do not become pregnant while taking this medicine or for at least 1 year after stopping it. Women of child-bearing potential will need to have a negative pregnancy test before starting this medicine. Women should inform their doctor if they wish to become pregnant or think they might be pregnant. There is a potential for serious side effects to an unborn child. Men should inform their doctors if they wish to father a child. This medicine may lower sperm counts. Talk to your health care professional or pharmacist for more information. Do not breast-feed an infant while taking this medicine or for 1 year after the last dose. What side effects may I notice from receiving this medicine? Side effects that you should report to your doctor or health care professional as soon as possible:  allergic reactions like skin rash, itching or hives, swelling of the face, lips, or tongue  feeling faint or lightheaded, falls  pain, tingling, numbness, or weakness in the legs  signs and symptoms of infection like fever or chills; cough; flu-like symptoms; sore throat  vaginal bleeding Side effects that usually do not require medical attention (report to your doctor or health care professional if they continue or are bothersome):  aches, pains  constipation  diarrhea  headache  hot flashes  nausea, vomiting  pain at site where injected  stomach pain This list may not describe all possible side effects. Call your doctor for medical advice about side effects. You may report side effects to FDA at 1-800-FDA-1088. Where should I keep my medicine? This drug is given in a hospital or clinic and will   not be stored at home. NOTE: This sheet is a summary. It may not cover all possible information. If you have questions about this medicine, talk to your doctor, pharmacist, or health care provider.  2020 Elsevier/Gold Standard (2017-06-09 11:34:41)  

## 2020-01-28 ENCOUNTER — Inpatient Hospital Stay: Payer: Medicare Other | Attending: Oncology

## 2020-01-28 ENCOUNTER — Telehealth: Payer: Self-pay

## 2020-01-28 DIAGNOSIS — Z79818 Long term (current) use of other agents affecting estrogen receptors and estrogen levels: Secondary | ICD-10-CM | POA: Insufficient documentation

## 2020-01-28 DIAGNOSIS — Z170 Estrogen receptor positive status [ER+]: Secondary | ICD-10-CM | POA: Insufficient documentation

## 2020-01-28 DIAGNOSIS — C50912 Malignant neoplasm of unspecified site of left female breast: Secondary | ICD-10-CM | POA: Insufficient documentation

## 2020-01-29 IMAGING — CR DG CHEST 2V
2 series · 2 of 2 positions shown · non-contrast
Comparison: CT chest 07/25/2017

CLINICAL DATA: Preoperative evaluation for aortic valve surgery;
history coronary artery disease, hypertension, former smoker, breast
cancer

EXAM:
CHEST - 2 VIEW

[w chest pa]
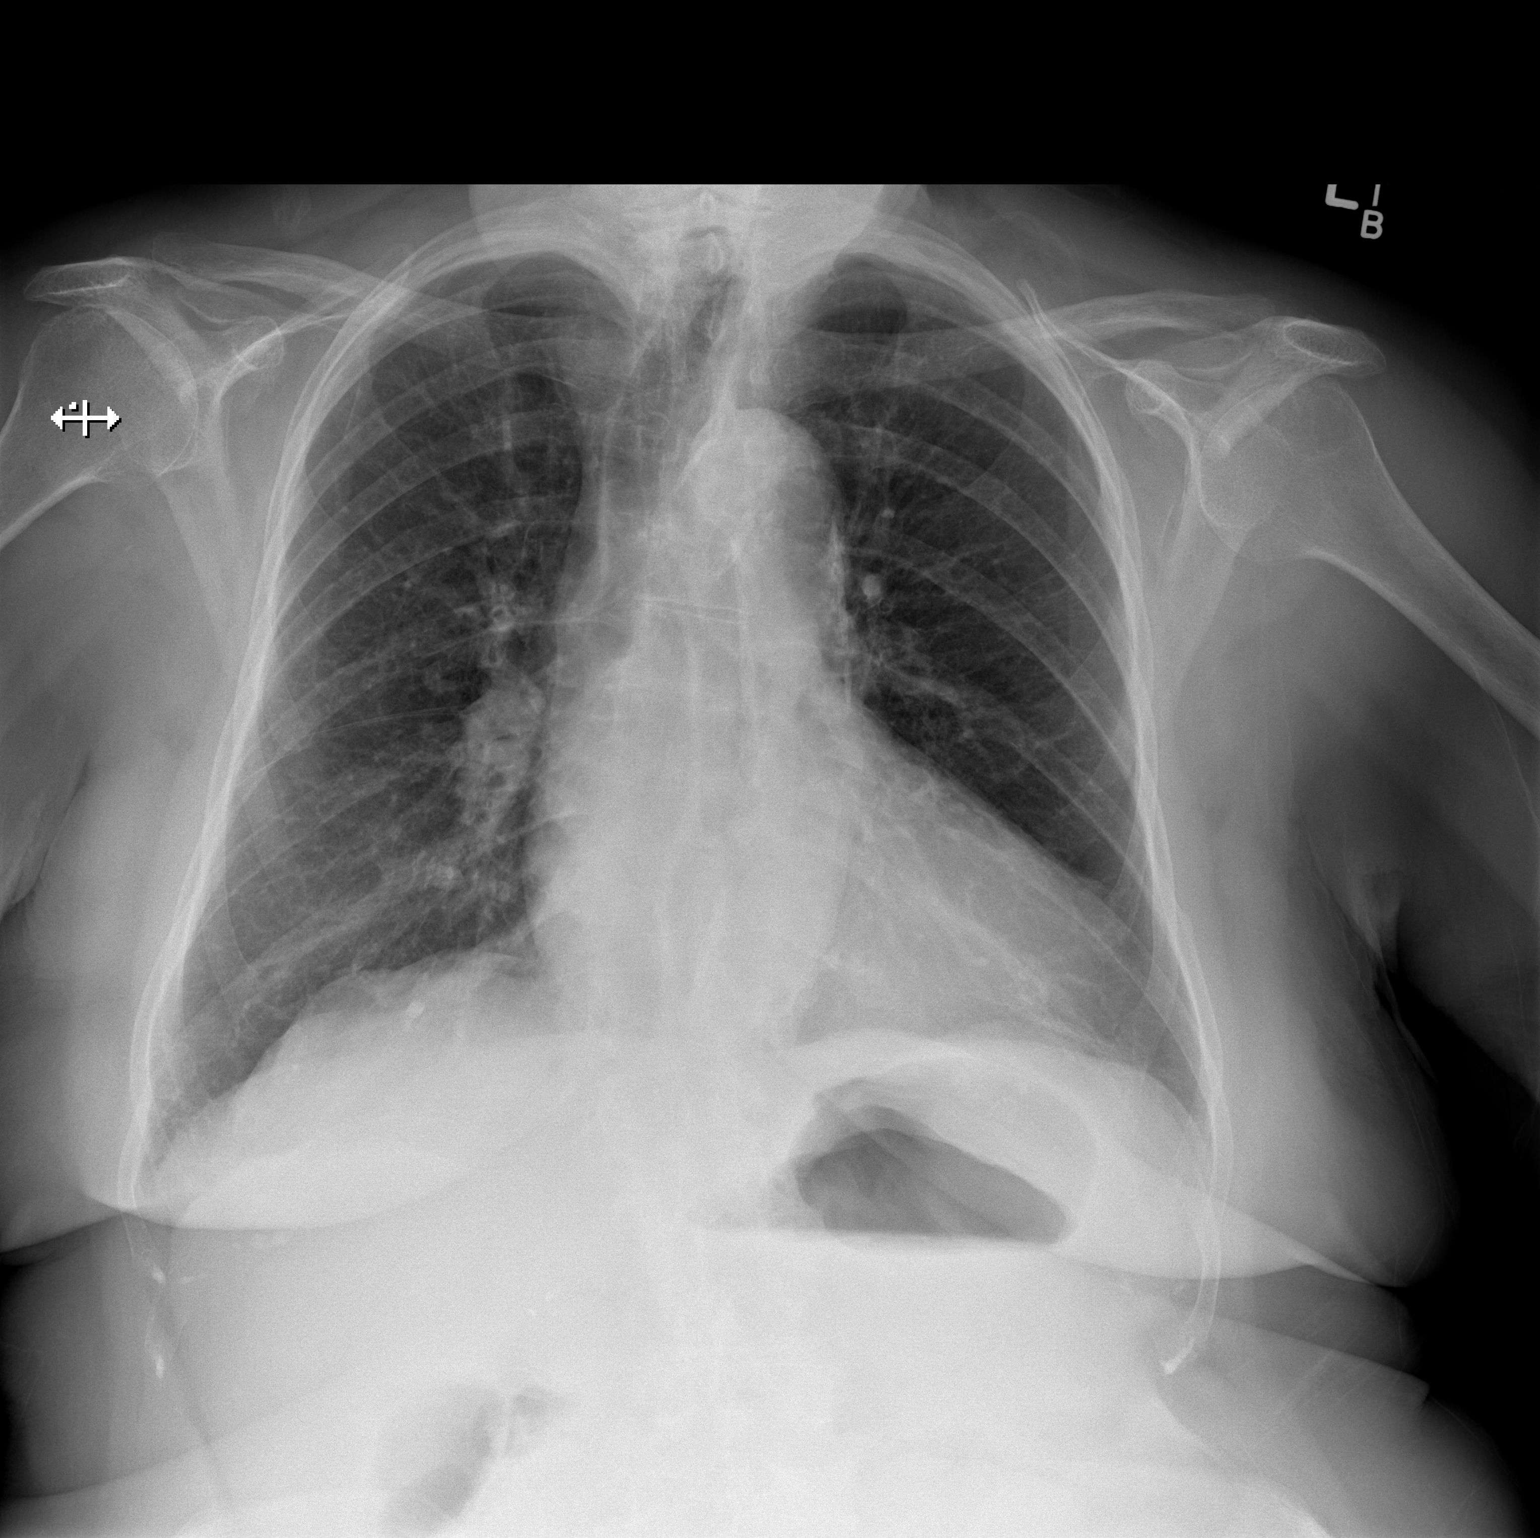

[w chest lat]
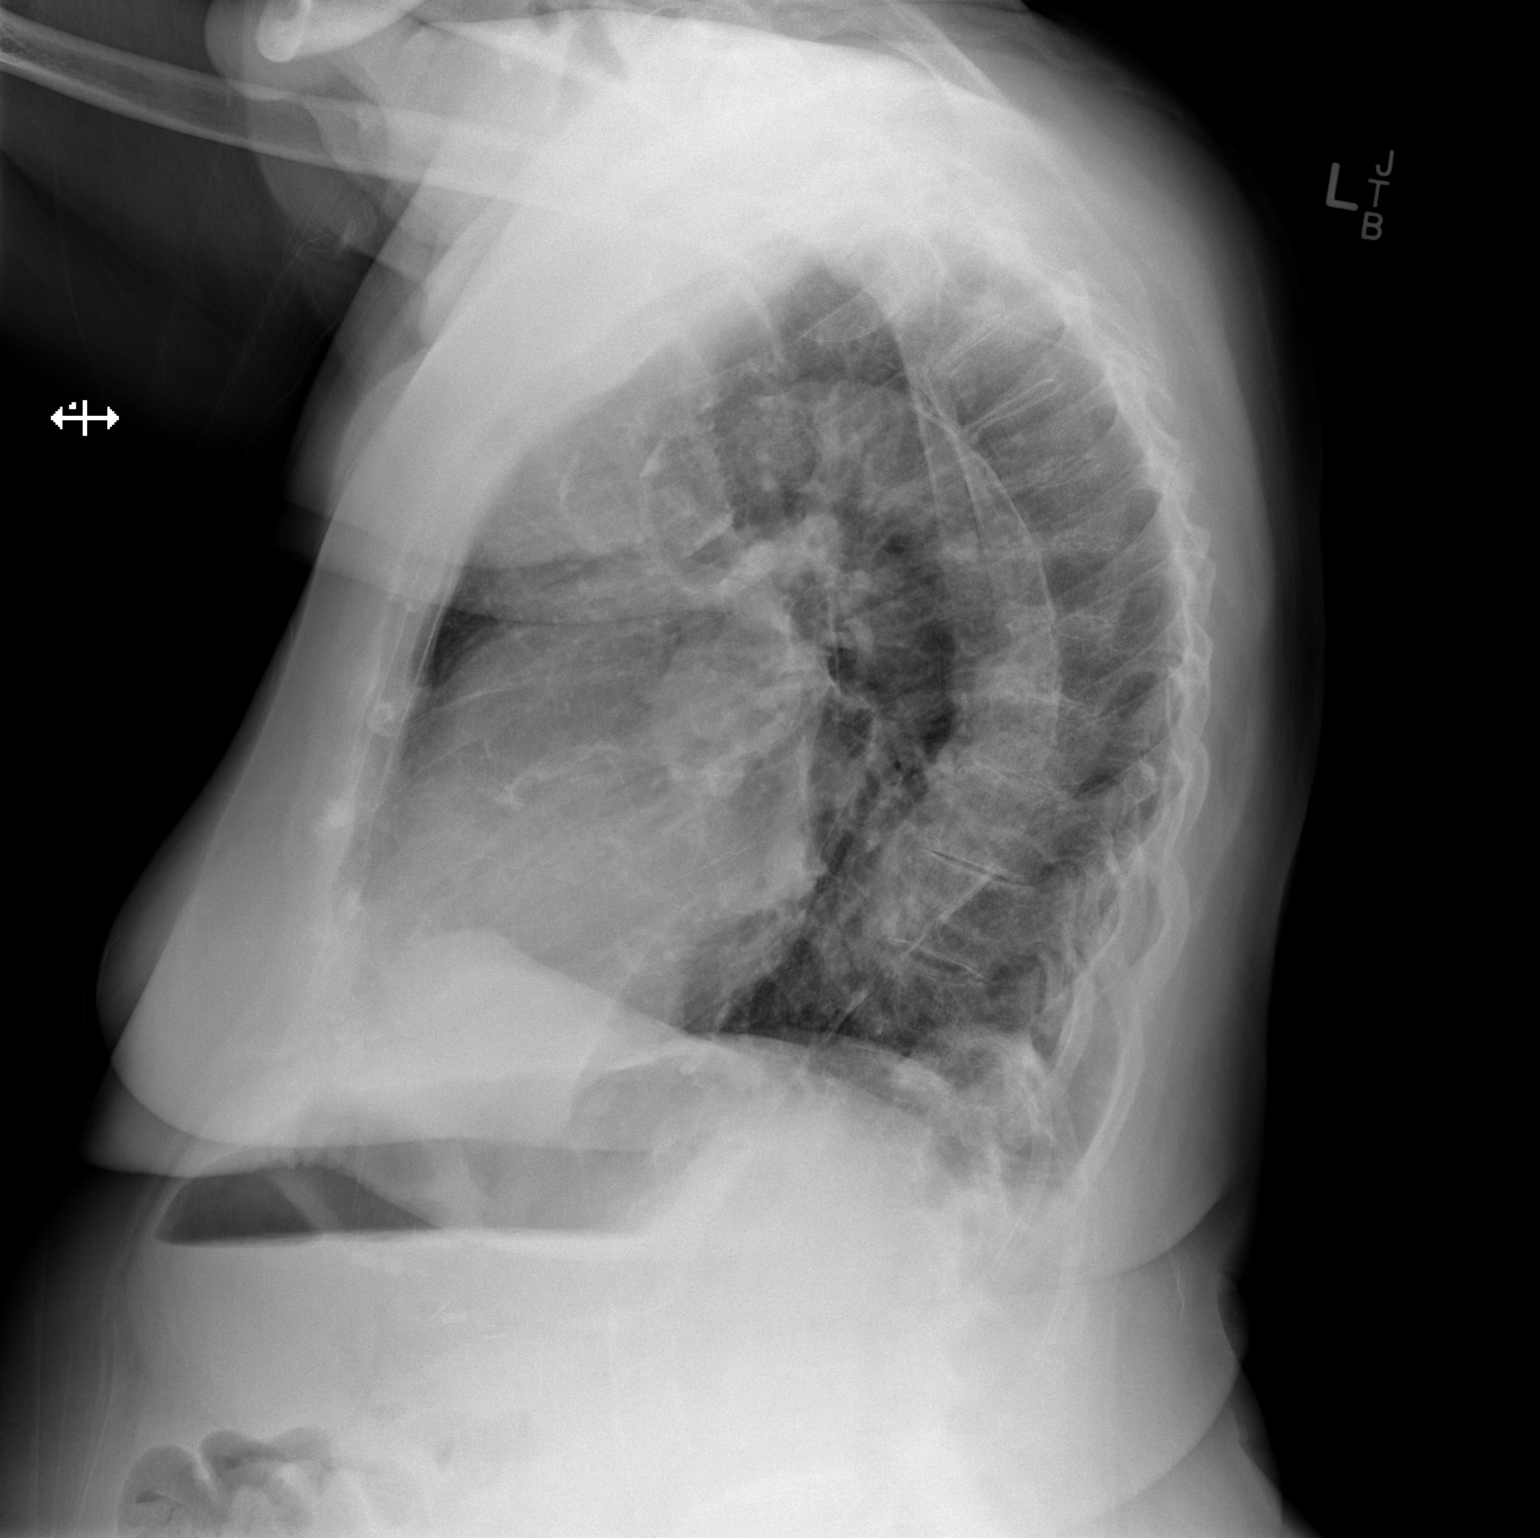

[2 of 2 positions shown; findings below may reference images not displayed]

FINDINGS: Enlargement of cardiac silhouette.

Atherosclerotic calcification aorta.

Mediastinal contours and pulmonary vascularity normal.

Bronchitic and emphysematous changes question COPD.

No acute infiltrate, pleural effusion or pneumothorax.

Bones demineralized.
IMPRESSION: Enlargement of cardiac silhouette.

COPD changes without acute infiltrate.

## 2020-01-30 ENCOUNTER — Other Ambulatory Visit: Payer: Self-pay | Admitting: Pharmacist

## 2020-02-01 ENCOUNTER — Inpatient Hospital Stay: Payer: Medicare Other

## 2020-02-01 ENCOUNTER — Other Ambulatory Visit: Payer: Self-pay

## 2020-02-01 VITALS — BP 159/81 | HR 70 | Resp 18 | Ht 59.0 in | Wt 192.2 lb

## 2020-02-01 DIAGNOSIS — C50112 Malignant neoplasm of central portion of left female breast: Secondary | ICD-10-CM

## 2020-02-01 DIAGNOSIS — Z170 Estrogen receptor positive status [ER+]: Secondary | ICD-10-CM | POA: Diagnosis not present

## 2020-02-01 DIAGNOSIS — Z79818 Long term (current) use of other agents affecting estrogen receptors and estrogen levels: Secondary | ICD-10-CM | POA: Diagnosis not present

## 2020-02-01 DIAGNOSIS — C50912 Malignant neoplasm of unspecified site of left female breast: Secondary | ICD-10-CM | POA: Diagnosis present

## 2020-02-01 MED ORDER — FULVESTRANT 250 MG/5ML IM SOLN
INTRAMUSCULAR | Status: AC
  Filled 2020-02-01: qty 10

## 2020-02-01 MED ORDER — FULVESTRANT 250 MG/5ML IM SOLN
500.0000 mg | Freq: Once | INTRAMUSCULAR | Status: AC
  Administered 2020-02-01: 500 mg via INTRAMUSCULAR

## 2020-02-01 NOTE — Progress Notes (Signed)
PT STABLE AT TIME OF DISCHARGE 

## 2020-02-01 NOTE — Patient Instructions (Signed)
Fulvestrant injection What is this medicine? FULVESTRANT (ful VES trant) blocks the effects of estrogen. It is used to treat breast cancer. This medicine may be used for other purposes; ask your health care provider or pharmacist if you have questions. COMMON BRAND NAME(S): FASLODEX What should I tell my health care provider before I take this medicine? They need to know if you have any of these conditions:  bleeding disorders  liver disease  low blood counts, like low white cell, platelet, or red cell counts  an unusual or allergic reaction to fulvestrant, other medicines, foods, dyes, or preservatives  pregnant or trying to get pregnant  breast-feeding How should I use this medicine? This medicine is for injection into a muscle. It is usually given by a health care professional in a hospital or clinic setting. Talk to your pediatrician regarding the use of this medicine in children. Special care may be needed. Overdosage: If you think you have taken too much of this medicine contact a poison control center or emergency room at once. NOTE: This medicine is only for you. Do not share this medicine with others. What if I miss a dose? It is important not to miss your dose. Call your doctor or health care professional if you are unable to keep an appointment. What may interact with this medicine?  medicines that treat or prevent blood clots like warfarin, enoxaparin, dalteparin, apixaban, dabigatran, and rivaroxaban This list may not describe all possible interactions. Give your health care provider a list of all the medicines, herbs, non-prescription drugs, or dietary supplements you use. Also tell them if you smoke, drink alcohol, or use illegal drugs. Some items may interact with your medicine. What should I watch for while using this medicine? Your condition will be monitored carefully while you are receiving this medicine. You will need important blood work done while you are taking  this medicine. Do not become pregnant while taking this medicine or for at least 1 year after stopping it. Women of child-bearing potential will need to have a negative pregnancy test before starting this medicine. Women should inform their doctor if they wish to become pregnant or think they might be pregnant. There is a potential for serious side effects to an unborn child. Men should inform their doctors if they wish to father a child. This medicine may lower sperm counts. Talk to your health care professional or pharmacist for more information. Do not breast-feed an infant while taking this medicine or for 1 year after the last dose. What side effects may I notice from receiving this medicine? Side effects that you should report to your doctor or health care professional as soon as possible:  allergic reactions like skin rash, itching or hives, swelling of the face, lips, or tongue  feeling faint or lightheaded, falls  pain, tingling, numbness, or weakness in the legs  signs and symptoms of infection like fever or chills; cough; flu-like symptoms; sore throat  vaginal bleeding Side effects that usually do not require medical attention (report to your doctor or health care professional if they continue or are bothersome):  aches, pains  constipation  diarrhea  headache  hot flashes  nausea, vomiting  pain at site where injected  stomach pain This list may not describe all possible side effects. Call your doctor for medical advice about side effects. You may report side effects to FDA at 1-800-FDA-1088. Where should I keep my medicine? This drug is given in a hospital or clinic and will   not be stored at home. NOTE: This sheet is a summary. It may not cover all possible information. If you have questions about this medicine, talk to your doctor, pharmacist, or health care provider.  2020 Elsevier/Gold Standard (2017-06-09 11:34:41)  

## 2020-02-22 NOTE — Progress Notes (Signed)
PT STABLE AT TIME OF DISCHARGE 

## 2020-02-25 ENCOUNTER — Ambulatory Visit: Payer: Medicare Other

## 2020-02-27 ENCOUNTER — Telehealth: Payer: Self-pay

## 2020-02-27 ENCOUNTER — Inpatient Hospital Stay: Payer: Medicare Other

## 2020-02-27 NOTE — Telephone Encounter (Signed)
Patient called for missed appointment today. Patient asked to reschedule appointment to 02/28/20 at 1400.

## 2020-02-28 ENCOUNTER — Inpatient Hospital Stay: Payer: Medicare Other | Attending: Oncology

## 2020-02-28 ENCOUNTER — Other Ambulatory Visit: Payer: Self-pay

## 2020-02-28 VITALS — BP 161/77 | HR 75 | Temp 98.0°F | Resp 18 | Ht 59.0 in | Wt 193.8 lb

## 2020-02-28 DIAGNOSIS — Z170 Estrogen receptor positive status [ER+]: Secondary | ICD-10-CM | POA: Insufficient documentation

## 2020-02-28 DIAGNOSIS — C50912 Malignant neoplasm of unspecified site of left female breast: Secondary | ICD-10-CM | POA: Diagnosis present

## 2020-02-28 DIAGNOSIS — Z79818 Long term (current) use of other agents affecting estrogen receptors and estrogen levels: Secondary | ICD-10-CM | POA: Diagnosis not present

## 2020-02-28 DIAGNOSIS — C50112 Malignant neoplasm of central portion of left female breast: Secondary | ICD-10-CM

## 2020-02-28 MED ORDER — FULVESTRANT 250 MG/5ML IM SOLN
INTRAMUSCULAR | Status: AC
  Filled 2020-02-28: qty 10

## 2020-02-28 MED ORDER — FULVESTRANT 250 MG/5ML IM SOLN
500.0000 mg | Freq: Once | INTRAMUSCULAR | Status: AC
  Administered 2020-02-28: 500 mg via INTRAMUSCULAR

## 2020-02-28 NOTE — Patient Instructions (Signed)
[facility] Discharge Instructions for Patients Receiving Chemotherapy  Today you received the following chemotherapy agents fulvestrant  To help prevent nausea and vomiting after your treatment, we encourage you to take your nausea medication.   If you develop nausea and vomiting that is not controlled by your nausea medication, call the clinic.   BELOW ARE SYMPTOMS THAT SHOULD BE REPORTED IMMEDIATELY:  *FEVER GREATER THAN 100.5 F  *CHILLS WITH OR WITHOUT FEVER  NAUSEA AND VOMITING THAT IS NOT CONTROLLED WITH YOUR NAUSEA MEDICATION  *UNUSUAL SHORTNESS OF BREATH  *UNUSUAL BRUISING OR BLEEDING  TENDERNESS IN MOUTH AND THROAT WITH OR WITHOUT PRESENCE OF ULCERS  *URINARY PROBLEMS  *BOWEL PROBLEMS  UNUSUAL RASH Items with * indicate a potential emergency and should be followed up as soon as possible.  Feel free to call the clinic should you have any questions or concerns at The clinic phone number is [phone].  Please show the [place] at check-in to the Emergency Department and triage nurse.

## 2020-03-05 NOTE — Telephone Encounter (Signed)
DONE

## 2020-03-11 NOTE — Progress Notes (Signed)
PT STABLE AT TIME OF DISCHARGE 

## 2020-03-12 ENCOUNTER — Other Ambulatory Visit: Payer: Self-pay | Admitting: Hematology and Oncology

## 2020-03-12 DIAGNOSIS — Z170 Estrogen receptor positive status [ER+]: Secondary | ICD-10-CM

## 2020-03-20 NOTE — Progress Notes (Signed)
[facility]  [address] [place],  [place]  [address] [phone]  Clinic Day:  03/24/2020  Referring physician: Ernestene Kiel, MD   This document serves as a record of services personally performed by [name], MD. It was created on their behalf by [name],[name], a trained medical scribe. The creation of this record is based on the scribe's personal observations and the provider's statements to them.   CHIEF COMPLAINT:  CC: History of multiple breast cancers  Current Treatment:  Monthly fulvestrant injections   HISTORY OF PRESENT ILLNESS:  [name] is a 87 y.o. female with a history of multiple breast cancers.  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  Pathology revealed a 0.7 cm lesion which was estrogen and progesterone receptor positive.  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  Estrogen and progesterone receptors were positive.  She received adjuvant radiation.  She was placed on tamoxifen, but stopped it on her own within a few months.  In December 2014, she was found to have a new lesion of the left breast found on routine mammogram and associated with retraction of the left nipple.  Initially, it measured 1.9 cm with an enlarged left axillary node, but slowly enlarged over the next few months, up to 3 cm.  Biopsy confirmed invasive mammary carcinoma with lobular features.  Estrogen and progesterone receptors were positive with her 2 neu negative.  Ki 67 was 34%.  CT scans were negative for metastatic disease.  She has many comorbidities including significant cardiac disease, so declined surgery.  She was placed on hormonal therapy with letrozole 2.5 mg daily in 2015.  She had a good response to this, with control of her disease.  Bilateral mammogram and left breast  ultrasound in February of 2017 showed a persistent node, which was down to 5-6 mm and the subareolar left breast calcifications were stable, with no evidence of definite mass.  She also had a bone density at that time, which continues to show osteopenia.  She stopped the letrozole in March of 2017, because of hot flashes, with improvement of this.  However, she had progressive disease while off  treatment with a recurrent central left breast mass.  She was then placed on fulvestrant injections in May of 2017, and has tolerated these quite well.  She has had a slow decrease in her mass since that time. When I examined her last year, I felt a nodular masslike effect in the epigastrium and so wanted to recheck this.  I brought her back later to recheck it, and it felt soft, like a lipoma.  She eventually had ultrasound imaging and this was found to be a small epigastric ventral hernia containing omental fat.  She had been having diarrhea and Dr. Melina Copa saw her in November but did not recommend colonoscopy because of her age and comorbidities.  She was having increased dyspnea with exertion and was occasionally feeling a tight feeling in her throat.  We referred her back to her cardiologist, Dr. Daneen Schick.  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  She is on liquid B12 supplement and her appetite is good.  She also had an ultrasound of her thyroid in February of 2019 to follow  up on some nodules, and the nodules were stable to decreased in size.  Bone density scan from June 2021 revealed osteoporosis with a T-score of -3.2 of the left forearm, previously -1.8.  The right femur measures -2.2, previously -1.8, and is considered osteopenic.  Dual femur total mean measures -1.7, previously -1.2.    INTERVAL HISTORY:  [name] is here for routine follow up prior to her next fulvestrant.  She states that she has been doing fairly well other than unsteady  gait.  She has been using a walker to ambulate for the past 2-3 years.  She has been taking collagen.  Her hemoglobin has decreased from 12.7 to 11.8, and her white count and platelets are normal.  Chemistries are unremarkable except for a creatinine of 1.2, stable, and a BUN of 18, improved.  Her  appetite is good, and her weight is stable since her last visit.  She denies fever, chills or other signs of infection.  She denies nausea, vomiting, bowel issues, or abdominal pain.  She denies sore throat, cough, dyspnea, or chest pain.  REVIEW OF SYSTEMS:  Review of Systems  Constitutional: Negative.   HENT:  Negative.   Eyes: Negative.   Respiratory: Negative.   Cardiovascular: Negative.   Gastrointestinal: Negative.   Endocrine: Negative.   Genitourinary: Negative.    Musculoskeletal: Positive for gait problem (unsteadiness).  Skin: Negative.   Neurological: Positive for gait problem (unsteadiness).  Hematological: Negative.   Psychiatric/Behavioral: Negative.      VITALS:  Blood pressure 138/76, pulse 65, temperature 97.8 F (36.6 C), temperature source Oral, resp. rate 18, height 4\' 11"  (1.499 m), weight 170 lb 14.4 oz (77.5 kg), SpO2 95 %.  Wt Readings from Last 3 Encounters:  03/24/20 170 lb 14.4 oz (77.5 kg)  12/03/19 170 lb 2 oz (77.2 kg)  02/28/20 193 lb 12 oz (87.9 kg)    Body mass index is 34.52 kg/m.  Performance status (ECOG): 1 - Symptomatic but completely ambulatory  PHYSICAL EXAM:  Physical Exam Constitutional:      General: She is not in acute distress.    Appearance: Normal appearance. She is normal weight.  HENT:     Head: Normocephalic and atraumatic.  Eyes:     General: No scleral icterus.    Extraocular Movements: Extraocular movements intact.     Conjunctiva/sclera: Conjunctivae normal.     Pupils: Pupils are equal, round, and reactive to light.  Cardiovascular:     Rate and Rhythm: Normal rate and regular rhythm.     Pulses: Normal pulses.     Heart  sounds: Normal heart sounds. No murmur heard. No friction rub. No gallop.   Pulmonary:     Effort: Pulmonary effort is normal. No respiratory distress.     Breath sounds: Normal breath sounds.  Chest:     Comments: I can feel one small lymph node of the left axilla measuring 1-2 cm, and mild firmness in the lateral side of the left breast, but no masses in either breast.  There is mild firmness of the upper outer quadrant of the right breast. Abdominal:     General: Bowel sounds are normal. There is no distension.     Palpations: Abdomen is soft. There is no mass.     Tenderness: There is no abdominal tenderness.     Comments: She has a small nodule of the epigastrium on palpation.  Musculoskeletal:        General: Normal range of motion.  Cervical back: Normal range of motion and neck supple.     Right lower leg: No edema.     Left lower leg: No edema.  Lymphadenopathy:     Cervical: No cervical adenopathy.  Skin:    General: Skin is warm and dry.  Neurological:     General: No focal deficit present.     Mental Status: She is alert and oriented to person, place, and time. Mental status is at baseline.     Motor: Weakness present.     Comments: Gait is weak and unsteady.  She uses a walker.  Psychiatric:        Mood and Affect: Mood normal.        Behavior: Behavior normal.        Thought Content: Thought content normal.        Judgment: Judgment normal.     LABS:   CBC Latest Ref Rng & Units 05/23/2019 02/26/2019 08/03/2017  WBC 3.4 - 10.8 x10E3/uL 6.9 8.1 9.5  Hemoglobin 11.1 - 15.9 g/dL 99.2 11.4(L) 9.5(L)  Hematocrit 34.0 - 46.6 % 35.6 36.3 29.3(L)  Platelets 150 - 450 x10E3/uL 253 226 214   CMP Latest Ref Rng & Units 05/23/2019 02/26/2019 08/03/2017  Glucose 65 - 99 mg/dL 82 426(S) 341(D)  BUN 10 - 36 mg/dL 18 62(I) 13  Creatinine 0.57 - 1.00 mg/dL 2.97(L) 8.92(J) 1.94  Sodium 134 - 144 mmol/L 137 137 133(L)  Potassium 3.5 - 5.2 mmol/L 5.0 4.1 3.8  Chloride 96 -  106 mmol/L 99 102 100(L)  CO2 20 - 29 mmol/L 24 25 24   Calcium 8.7 - 10.3 mg/dL 9.4 9.2 )  Total Protein 6.0 - 8.5 g/dL 6.6 - -  Total Bilirubin 0.0 - 1.2 mg/dL 0.6 - -  Alkaline Phos 39 - 117 IU/L 57 - -  AST 0 - 40 IU/L 21 - -  ALT 0 - 32 IU/L 24 - -    STUDIES:  No results found.   Allergies:  Allergies  Allergen Reactions  . Betadine [Povidone Iodine]     blisters  . Latex Swelling    hives  . Zestril [Lisinopril]     cough    Current Medications: Current Outpatient Medications  Medication Sig Dispense Refill  . famotidine (PEPCID) 40 MG tablet Take by mouth.    . nortriptyline (PAMELOR) 10 MG capsule Take by mouth.    . terconazole (TERAZOL 3) 0.8 % vaginal cream Place vaginally.    1.7(E acetaminophen (TYLENOL) 500 MG tablet Take 1,000 mg by mouth daily as needed (sinus headaches).    . ALPRAZolam (XANAX) 0.25 MG tablet Take 0.25 mg by mouth 3 (three) times daily as needed for anxiety or sleep.     Marland Kitchen amLODipine (NORVASC) 2.5 MG tablet TAKE ONE (1) TABLET BY MOUTH ONCE DAILY 30 tablet 3  . atorvastatin (LIPITOR) 10 MG tablet Take 10 mg by mouth daily.    . Azelastine-Fluticasone 137-50 MCG/ACT SUSP Place 1 spray into both nostrils 2 (two) times daily.    Marland Kitchen buPROPion (WELLBUTRIN XL) 150 MG 24 hr tablet Take 150 mg by mouth every morning.    . busPIRone (BUSPAR) 5 MG tablet Take 5 mg by mouth 3 (three) times daily.    . cholecalciferol (VITAMIN D3) 25 MCG (1000 UT) tablet Take 1,000 Units by mouth daily.    . citalopram (CELEXA) 10 MG tablet Take 10 mg by mouth at bedtime.    . cloNIDine (CATAPRES) 0.1 MG tablet Take 0.05  mg by mouth 2 (two) times daily.    . diclofenac (VOLTAREN) 75 MG EC tablet TAKE ONE (1) TABLET BY MOUTH TWO (2) TIMES DAILY 180 tablet 3  . eszopiclone (LUNESTA) 2 MG TABS tablet Take 2 mg by mouth at bedtime.    . fluconazole (DIFLUCAN) 100 MG tablet Take 100 mg by mouth daily.    . fluticasone (FLONASE) 50 MCG/ACT nasal spray Place into both  nostrils daily.    . fulvestrant (FASLODEX) 250 MG/5ML injection Inject into the muscle once. One injection each buttock over 1-2 minutes. Warm prior to use.    Marland Kitchen HYDROcodone-acetaminophen (NORCO/VICODIN) 5-325 MG tablet Take 1-2 tablets by mouth every 6 (six) hours as needed for moderate pain.    Marland Kitchen levothyroxine (SYNTHROID, LEVOTHROID) 50 MCG tablet Take 50 mcg by mouth daily before breakfast.     . LORazepam (ATIVAN) 2 MG tablet Take 2 mg by mouth at bedtime as needed.    Marland Kitchen losartan (COZAAR) 25 MG tablet Take 25-50 mg by mouth daily as needed (High BP).    . metoprolol succinate (TOPROL-XL) 25 MG 24 hr tablet Take 25 mg by mouth daily.     . montelukast (SINGULAIR) 10 MG tablet Take 10 mg by mouth at bedtime.    Marland Kitchen olmesartan (BENICAR) 40 MG tablet TAKE ONE TABLET BY MOUTH DAILY 90 tablet 2  . polycarbophil (FIBERCON) 625 MG tablet Take 625 mg by mouth daily.    . polyethylene glycol (MIRALAX / GLYCOLAX) 17 g packet Take 17 g by mouth daily.    . temazepam (RESTORIL) 30 MG capsule Take 30 mg by mouth at bedtime as needed.    . vitamin B-12 (CYANOCOBALAMIN) 1000 MCG tablet Take 1,000 mcg by mouth daily.     No current facility-administered medications for this visit.     ASSESSMENT & PLAN:   Assessment:   1. Stage 0 breast cancer April 2009 of the left breast treated with lumpectomy.  2. Stage I invasive ductal carcinoma of the right breast in 2010 treated with surgery and adjuvant radiation.  3. Stage IIB left breast cancer diagnosed in December of 2014 with a 1.9 cm lesion and a 3 cm left axillary node for a T2 N1 M0.  Once again this was estrogen and progesterone receptor positive but she had not taken hormonal therapy in the past.  She declined surgery because of her comorbidities and so was placed on hormonal therapy with letrozole in 2015 until March of 2017. At that time she stopped it because of severe hot flashes and was changed to fulvestrant injections monthly as of May of 2017.  She has done very well with that.  Her left breast mass had started to grow back in 2017, but is back under control, and barely even palpable now.  4. Nodular mass effect in the epigastrium which is a small ventral hernia containing fat.  She underwent hernia repair in December 2020, but I can still feel a small nodule in the epigastrium on palpation.  5. Osteoporosis.  Bone density scan from June 2021 revealed severe worsening and she is now considered osteoporotic with a T-score of -3.2 of the left forearm.  The femur has also shown worsening but is still considered within the osteopenia range.   She will be due for repeat bone density scan in June 2023.  6. Status post TAVR 2019, and doing extremely well.   Plan: We will proceed with fulvestrant today, and continue every 4 weeks.  I will  see her back in 3 months with CBC and CMP for repeat examination.  She understands and agrees with this plan of care.  She knows to call if she needs to be seen sooner.   I provided 20 minutes of face-to-face time during this this encounter and > 50% was spent counseling as documented under my assessment and plan.    Derwood Kaplan, MD [facility] AT [facility] [address] [place] [address] Dept: [phone] Dept Fax: [phone]   I, Rita Ohara, am acting as scribe for Derwood Kaplan, MD  I have reviewed this report as typed by the medical scribe, and it is complete and accurate.

## 2020-03-24 ENCOUNTER — Other Ambulatory Visit: Payer: Self-pay | Admitting: Hematology and Oncology

## 2020-03-24 ENCOUNTER — Other Ambulatory Visit: Payer: Self-pay | Admitting: Oncology

## 2020-03-24 ENCOUNTER — Inpatient Hospital Stay: Payer: Medicare Other

## 2020-03-24 ENCOUNTER — Inpatient Hospital Stay: Payer: Medicare Other | Attending: Oncology | Admitting: Oncology

## 2020-03-24 ENCOUNTER — Telehealth: Payer: Self-pay | Admitting: Oncology

## 2020-03-24 ENCOUNTER — Other Ambulatory Visit: Payer: Self-pay

## 2020-03-24 ENCOUNTER — Ambulatory Visit: Payer: Medicare Other

## 2020-03-24 ENCOUNTER — Encounter: Payer: Self-pay | Admitting: Oncology

## 2020-03-24 VITALS — BP 138/76 | HR 65 | Temp 97.8°F | Resp 18 | Ht 59.0 in | Wt 170.9 lb

## 2020-03-24 DIAGNOSIS — Z170 Estrogen receptor positive status [ER+]: Secondary | ICD-10-CM | POA: Insufficient documentation

## 2020-03-24 DIAGNOSIS — D0512 Intraductal carcinoma in situ of left breast: Secondary | ICD-10-CM

## 2020-03-24 DIAGNOSIS — C50112 Malignant neoplasm of central portion of left female breast: Secondary | ICD-10-CM | POA: Diagnosis not present

## 2020-03-24 DIAGNOSIS — M810 Age-related osteoporosis without current pathological fracture: Secondary | ICD-10-CM | POA: Insufficient documentation

## 2020-03-24 DIAGNOSIS — C50911 Malignant neoplasm of unspecified site of right female breast: Secondary | ICD-10-CM | POA: Diagnosis not present

## 2020-03-24 DIAGNOSIS — C50912 Malignant neoplasm of unspecified site of left female breast: Secondary | ICD-10-CM | POA: Insufficient documentation

## 2020-03-24 DIAGNOSIS — Z79818 Long term (current) use of other agents affecting estrogen receptors and estrogen levels: Secondary | ICD-10-CM | POA: Insufficient documentation

## 2020-03-24 HISTORY — DX: Age-related osteoporosis without current pathological fracture: M81.0

## 2020-03-24 LAB — BASIC METABOLIC PANEL
BUN: 18 (ref 4–21)
CO2: 28 — AB (ref 13–22)
Chloride: 103 (ref 99–108)
Creatinine: 1.2 — AB (ref 0.5–1.1)
Glucose: 137
Potassium: 4.2 (ref 3.4–5.3)
Sodium: 137 (ref 137–147)

## 2020-03-24 LAB — COMPREHENSIVE METABOLIC PANEL
Albumin: 4.3 (ref 3.5–5.0)
Calcium: 9.7 (ref 8.7–10.7)

## 2020-03-24 LAB — CBC AND DIFFERENTIAL
HCT: 35 — AB (ref 36–46)
Hemoglobin: 11.8 — AB (ref 12.0–16.0)
Neutrophils Absolute: 5.99
Platelets: 209 (ref 150–399)
WBC: 7.4

## 2020-03-24 LAB — HEPATIC FUNCTION PANEL
ALT: 17 (ref 7–35)
AST: 27 (ref 13–35)
Alkaline Phosphatase: 48 (ref 25–125)
Bilirubin, Total: 1.1

## 2020-03-24 LAB — CBC: RBC: 3.8 — AB (ref 3.87–5.11)

## 2020-03-24 NOTE — Telephone Encounter (Signed)
Per 1/10 los next appt given to patient 

## 2020-03-26 ENCOUNTER — Inpatient Hospital Stay: Payer: Medicare Other

## 2020-03-26 ENCOUNTER — Other Ambulatory Visit: Payer: Self-pay

## 2020-03-26 VITALS — BP 153/55 | HR 64 | Temp 98.2°F | Resp 20 | Ht 59.0 in | Wt 171.2 lb

## 2020-03-26 DIAGNOSIS — Z170 Estrogen receptor positive status [ER+]: Secondary | ICD-10-CM

## 2020-03-26 DIAGNOSIS — C50912 Malignant neoplasm of unspecified site of left female breast: Secondary | ICD-10-CM | POA: Diagnosis present

## 2020-03-26 DIAGNOSIS — C50112 Malignant neoplasm of central portion of left female breast: Secondary | ICD-10-CM

## 2020-03-26 DIAGNOSIS — Z79818 Long term (current) use of other agents affecting estrogen receptors and estrogen levels: Secondary | ICD-10-CM | POA: Diagnosis not present

## 2020-03-26 MED ORDER — FULVESTRANT 250 MG/5ML IM SOLN
INTRAMUSCULAR | Status: AC
  Filled 2020-03-26: qty 10

## 2020-03-26 MED ORDER — FULVESTRANT 250 MG/5ML IM SOLN
500.0000 mg | Freq: Once | INTRAMUSCULAR | Status: AC
  Administered 2020-03-26: 500 mg via INTRAMUSCULAR

## 2020-03-26 NOTE — Patient Instructions (Signed)
Fulvestrant injection What is this medicine? FULVESTRANT (ful VES trant) blocks the effects of estrogen. It is used to treat breast cancer. This medicine may be used for other purposes; ask your health care provider or pharmacist if you have questions. COMMON BRAND NAME(S): FASLODEX What should I tell my health care provider before I take this medicine? They need to know if you have any of these conditions:  bleeding disorders  liver disease  low blood counts, like low white cell, platelet, or red cell counts  an unusual or allergic reaction to fulvestrant, other medicines, foods, dyes, or preservatives  pregnant or trying to get pregnant  breast-feeding How should I use this medicine? This medicine is for injection into a muscle. It is usually given by a health care professional in a hospital or clinic setting. Talk to your pediatrician regarding the use of this medicine in children. Special care may be needed. Overdosage: If you think you have taken too much of this medicine contact a poison control center or emergency room at once. NOTE: This medicine is only for you. Do not share this medicine with others. What if I miss a dose? It is important not to miss your dose. Call your doctor or health care professional if you are unable to keep an appointment. What may interact with this medicine?  medicines that treat or prevent blood clots like warfarin, enoxaparin, dalteparin, apixaban, dabigatran, and rivaroxaban This list may not describe all possible interactions. Give your health care provider a list of all the medicines, herbs, non-prescription drugs, or dietary supplements you use. Also tell them if you smoke, drink alcohol, or use illegal drugs. Some items may interact with your medicine. What should I watch for while using this medicine? Your condition will be monitored carefully while you are receiving this medicine. You will need important blood work done while you are taking  this medicine. Do not become pregnant while taking this medicine or for at least 1 year after stopping it. Women of child-bearing potential will need to have a negative pregnancy test before starting this medicine. Women should inform their doctor if they wish to become pregnant or think they might be pregnant. There is a potential for serious side effects to an unborn child. Men should inform their doctors if they wish to father a child. This medicine may lower sperm counts. Talk to your health care professional or pharmacist for more information. Do not breast-feed an infant while taking this medicine or for 1 year after the last dose. What side effects may I notice from receiving this medicine? Side effects that you should report to your doctor or health care professional as soon as possible:  allergic reactions like skin rash, itching or hives, swelling of the face, lips, or tongue  feeling faint or lightheaded, falls  pain, tingling, numbness, or weakness in the legs  signs and symptoms of infection like fever or chills; cough; flu-like symptoms; sore throat  vaginal bleeding Side effects that usually do not require medical attention (report to your doctor or health care professional if they continue or are bothersome):  aches, pains  constipation  diarrhea  headache  hot flashes  nausea, vomiting  pain at site where injected  stomach pain This list may not describe all possible side effects. Call your doctor for medical advice about side effects. You may report side effects to FDA at 1-800-FDA-1088. Where should I keep my medicine? This drug is given in a hospital or clinic and will   not be stored at home. NOTE: This sheet is a summary. It may not cover all possible information. If you have questions about this medicine, talk to your doctor, pharmacist, or health care provider.  2021 Elsevier/Gold Standard (2017-06-09 11:34:41)  

## 2020-04-20 ENCOUNTER — Other Ambulatory Visit: Payer: Self-pay | Admitting: Oncology

## 2020-04-23 ENCOUNTER — Telehealth: Payer: Self-pay

## 2020-04-23 ENCOUNTER — Inpatient Hospital Stay: Payer: Medicare Other | Attending: Oncology

## 2020-04-23 DIAGNOSIS — Z79818 Long term (current) use of other agents affecting estrogen receptors and estrogen levels: Secondary | ICD-10-CM | POA: Insufficient documentation

## 2020-04-23 DIAGNOSIS — Z170 Estrogen receptor positive status [ER+]: Secondary | ICD-10-CM | POA: Insufficient documentation

## 2020-04-23 DIAGNOSIS — C50912 Malignant neoplasm of unspecified site of left female breast: Secondary | ICD-10-CM | POA: Insufficient documentation

## 2020-04-23 NOTE — Telephone Encounter (Signed)
CALLED PT SEE NOTE.

## 2020-04-28 ENCOUNTER — Telehealth: Payer: Self-pay | Admitting: Oncology

## 2020-04-28 NOTE — Telephone Encounter (Signed)
04/28/20 spoke with patients son and al  appts r/s

## 2020-04-29 ENCOUNTER — Inpatient Hospital Stay: Payer: Medicare Other

## 2020-04-29 ENCOUNTER — Other Ambulatory Visit: Payer: Self-pay

## 2020-04-29 VITALS — BP 134/85 | HR 76 | Resp 18 | Ht 59.0 in | Wt 165.2 lb

## 2020-04-29 DIAGNOSIS — Z79818 Long term (current) use of other agents affecting estrogen receptors and estrogen levels: Secondary | ICD-10-CM | POA: Diagnosis not present

## 2020-04-29 DIAGNOSIS — Z170 Estrogen receptor positive status [ER+]: Secondary | ICD-10-CM

## 2020-04-29 DIAGNOSIS — C50912 Malignant neoplasm of unspecified site of left female breast: Secondary | ICD-10-CM | POA: Diagnosis present

## 2020-04-29 MED ORDER — FULVESTRANT 250 MG/5ML IM SOLN
INTRAMUSCULAR | Status: AC
  Filled 2020-04-29: qty 10

## 2020-04-29 MED ORDER — FULVESTRANT 250 MG/5ML IM SOLN
500.0000 mg | Freq: Once | INTRAMUSCULAR | Status: AC
Start: 2020-04-29 — End: 2020-04-29
  Administered 2020-04-29: 500 mg via INTRAMUSCULAR

## 2020-04-29 NOTE — Patient Instructions (Signed)
Fulvestrant injection What is this medicine? FULVESTRANT (ful VES trant) blocks the effects of estrogen. It is used to treat breast cancer. This medicine may be used for other purposes; ask your health care provider or pharmacist if you have questions. COMMON BRAND NAME(S): FASLODEX What should I tell my health care provider before I take this medicine? They need to know if you have any of these conditions:  bleeding disorders  liver disease  low blood counts, like low white cell, platelet, or red cell counts  an unusual or allergic reaction to fulvestrant, other medicines, foods, dyes, or preservatives  pregnant or trying to get pregnant  breast-feeding How should I use this medicine? This medicine is for injection into a muscle. It is usually given by a health care professional in a hospital or clinic setting. Talk to your pediatrician regarding the use of this medicine in children. Special care may be needed. Overdosage: If you think you have taken too much of this medicine contact a poison control center or emergency room at once. NOTE: This medicine is only for you. Do not share this medicine with others. What if I miss a dose? It is important not to miss your dose. Call your doctor or health care professional if you are unable to keep an appointment. What may interact with this medicine?  medicines that treat or prevent blood clots like warfarin, enoxaparin, dalteparin, apixaban, dabigatran, and rivaroxaban This list may not describe all possible interactions. Give your health care provider a list of all the medicines, herbs, non-prescription drugs, or dietary supplements you use. Also tell them if you smoke, drink alcohol, or use illegal drugs. Some items may interact with your medicine. What should I watch for while using this medicine? Your condition will be monitored carefully while you are receiving this medicine. You will need important blood work done while you are taking  this medicine. Do not become pregnant while taking this medicine or for at least 1 year after stopping it. Women of child-bearing potential will need to have a negative pregnancy test before starting this medicine. Women should inform their doctor if they wish to become pregnant or think they might be pregnant. There is a potential for serious side effects to an unborn child. Men should inform their doctors if they wish to father a child. This medicine may lower sperm counts. Talk to your health care professional or pharmacist for more information. Do not breast-feed an infant while taking this medicine or for 1 year after the last dose. What side effects may I notice from receiving this medicine? Side effects that you should report to your doctor or health care professional as soon as possible:  allergic reactions like skin rash, itching or hives, swelling of the face, lips, or tongue  feeling faint or lightheaded, falls  pain, tingling, numbness, or weakness in the legs  signs and symptoms of infection like fever or chills; cough; flu-like symptoms; sore throat  vaginal bleeding Side effects that usually do not require medical attention (report to your doctor or health care professional if they continue or are bothersome):  aches, pains  constipation  diarrhea  headache  hot flashes  nausea, vomiting  pain at site where injected  stomach pain This list may not describe all possible side effects. Call your doctor for medical advice about side effects. You may report side effects to FDA at 1-800-FDA-1088. Where should I keep my medicine? This drug is given in a hospital or clinic and will   not be stored at home. NOTE: This sheet is a summary. It may not cover all possible information. If you have questions about this medicine, talk to your doctor, pharmacist, or health care provider.  2021 Elsevier/Gold Standard (2017-06-09 11:34:41)  

## 2020-05-01 ENCOUNTER — Ambulatory Visit: Payer: Medicare Other | Admitting: Family Medicine

## 2020-05-21 ENCOUNTER — Ambulatory Visit: Payer: Medicare Other

## 2020-05-21 ENCOUNTER — Other Ambulatory Visit: Payer: Self-pay | Admitting: Physician Assistant

## 2020-05-27 ENCOUNTER — Inpatient Hospital Stay: Payer: Medicare Other

## 2020-05-27 ENCOUNTER — Other Ambulatory Visit: Payer: Self-pay

## 2020-05-27 ENCOUNTER — Inpatient Hospital Stay: Payer: Medicare Other | Attending: Oncology

## 2020-05-27 VITALS — Temp 98.3°F | Resp 18 | Ht 59.0 in | Wt 164.5 lb

## 2020-05-27 DIAGNOSIS — Z170 Estrogen receptor positive status [ER+]: Secondary | ICD-10-CM | POA: Diagnosis not present

## 2020-05-27 DIAGNOSIS — C50912 Malignant neoplasm of unspecified site of left female breast: Secondary | ICD-10-CM | POA: Diagnosis present

## 2020-05-27 DIAGNOSIS — Z79818 Long term (current) use of other agents affecting estrogen receptors and estrogen levels: Secondary | ICD-10-CM | POA: Diagnosis not present

## 2020-05-27 MED ORDER — FULVESTRANT 250 MG/5ML IM SOLN
500.0000 mg | Freq: Once | INTRAMUSCULAR | Status: AC
  Administered 2020-05-27: 500 mg via INTRAMUSCULAR

## 2020-05-27 MED ORDER — FULVESTRANT 250 MG/5ML IM SOLN
INTRAMUSCULAR | Status: AC
  Filled 2020-05-27: qty 10

## 2020-05-27 NOTE — Patient Instructions (Signed)
Fulvestrant injection What is this medicine? FULVESTRANT (ful VES trant) blocks the effects of estrogen. It is used to treat breast cancer. This medicine may be used for other purposes; ask your health care provider or pharmacist if you have questions. COMMON BRAND NAME(S): FASLODEX What should I tell my health care provider before I take this medicine? They need to know if you have any of these conditions:  bleeding disorders  liver disease  low blood counts, like low white cell, platelet, or red cell counts  an unusual or allergic reaction to fulvestrant, other medicines, foods, dyes, or preservatives  pregnant or trying to get pregnant  breast-feeding How should I use this medicine? This medicine is for injection into a muscle. It is usually given by a health care professional in a hospital or clinic setting. Talk to your pediatrician regarding the use of this medicine in children. Special care may be needed. Overdosage: If you think you have taken too much of this medicine contact a poison control center or emergency room at once. NOTE: This medicine is only for you. Do not share this medicine with others. What if I miss a dose? It is important not to miss your dose. Call your doctor or health care professional if you are unable to keep an appointment. What may interact with this medicine?  medicines that treat or prevent blood clots like warfarin, enoxaparin, dalteparin, apixaban, dabigatran, and rivaroxaban This list may not describe all possible interactions. Give your health care provider a list of all the medicines, herbs, non-prescription drugs, or dietary supplements you use. Also tell them if you smoke, drink alcohol, or use illegal drugs. Some items may interact with your medicine. What should I watch for while using this medicine? Your condition will be monitored carefully while you are receiving this medicine. You will need important blood work done while you are taking  this medicine. Do not become pregnant while taking this medicine or for at least 1 year after stopping it. Women of child-bearing potential will need to have a negative pregnancy test before starting this medicine. Women should inform their doctor if they wish to become pregnant or think they might be pregnant. There is a potential for serious side effects to an unborn child. Men should inform their doctors if they wish to father a child. This medicine may lower sperm counts. Talk to your health care professional or pharmacist for more information. Do not breast-feed an infant while taking this medicine or for 1 year after the last dose. What side effects may I notice from receiving this medicine? Side effects that you should report to your doctor or health care professional as soon as possible:  allergic reactions like skin rash, itching or hives, swelling of the face, lips, or tongue  feeling faint or lightheaded, falls  pain, tingling, numbness, or weakness in the legs  signs and symptoms of infection like fever or chills; cough; flu-like symptoms; sore throat  vaginal bleeding Side effects that usually do not require medical attention (report to your doctor or health care professional if they continue or are bothersome):  aches, pains  constipation  diarrhea  headache  hot flashes  nausea, vomiting  pain at site where injected  stomach pain This list may not describe all possible side effects. Call your doctor for medical advice about side effects. You may report side effects to FDA at 1-800-FDA-1088. Where should I keep my medicine? This drug is given in a hospital or clinic and will   not be stored at home. NOTE: This sheet is a summary. It may not cover all possible information. If you have questions about this medicine, talk to your doctor, pharmacist, or health care provider.  2021 Elsevier/Gold Standard (2017-06-09 11:34:41)  

## 2020-06-13 NOTE — Progress Notes (Signed)
[facility]  [address] [place],  [place]  [address] [phone]  Clinic Day:  06/16/2020  Referring physician: Ernestene Kiel, MD   This document serves as a record of services personally performed by [name], MD. It was created on their behalf by [name],[name], a trained medical scribe. The creation of this record is based on the scribe's personal observations and the provider's statements to them.  CHIEF COMPLAINT:  CC: History of multiple breast cancers  Current Treatment:  Monthly fulvestrant injections   HISTORY OF PRESENT ILLNESS:  [name] is a 87 y.o. female with a history of multiple breast cancers.  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  Pathology revealed a 0.7 cm lesion which was estrogen and progesterone receptor positive.  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  Estrogen and progesterone receptors were positive.  She received adjuvant radiation.  She was placed on tamoxifen, but stopped it on her own within a few months.  In December 2014, she was found to have a new lesion of the left breast found on routine mammogram and associated with retraction of the left nipple.  Initially, it measured 1.9 cm with an enlarged left axillary node, but slowly enlarged over the next few months, up to 3 cm.  Biopsy confirmed invasive mammary carcinoma with lobular features.  Estrogen and progesterone receptors were positive with her 2 neu negative.  Ki 67 was 34%.  CT scans were negative for metastatic disease.  She has many comorbidities including significant cardiac disease, so declined surgery.  She was placed on hormonal therapy with letrozole 2.5 mg daily in 2015.  She had a good response to this, with control of her disease.  Bilateral mammogram and left breast  ultrasound in February of 2017 showed a persistent node, which was down to 5-6 mm and the subareolar left breast calcifications were stable, with no evidence of definite mass.  She also had a bone density at that time, which continues to show osteopenia.  She stopped the letrozole in March of 2017, because of hot flashes, with improvement of this.  However, she had progressive disease while off  treatment with a recurrent central left breast mass.  She was then placed on fulvestrant injections in May of 2017, and has tolerated these quite well.  She has had a slow decrease in her mass since that time. When I examined her last year, I felt a nodular masslike effect in the epigastrium and so wanted to recheck this.  I brought her back later to recheck it, and it felt soft, like a lipoma.  She eventually had ultrasound imaging and this was found to be a small epigastric ventral hernia containing omental fat.  She had been having diarrhea and Dr. Melina Copa saw her in November but did not recommend colonoscopy because of her age and comorbidities.  She was having increased dyspnea with exertion and was occasionally feeling a tight feeling in her throat.  We referred her back to her cardiologist, Dr. Daneen Schick.  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  She is on liquid B12 supplement and her appetite is good.  She also had an ultrasound of her thyroid in February of 2019 to follow up  on some nodules, and the nodules were stable to decreased in size.  Bone density scan from June 2021 revealed osteoporosis with a T-score of -3.2 of the left forearm, previously -1.8.  The right femur measures -2.2, previously -1.8, and is considered osteopenic.  Dual femur total mean measures -1.7, previously -1.2.    INTERVAL HISTORY:  [name] is here for routine follow up prior to fulvestrant.  She did contract COVID pneumonia back in January, but has fully recovered.  She  states that she has been doing fairly well other than poor appetite and weight loss following her COVID infection.  She notes a bothersome mole of her mid back.  She does note some loneliness within the last year.  Her hemoglobin is fairly stable at 11.6, and her white count and platelets are normal.  Chemistries are unremarkable except for a creatinine of 1.1, improved from 1.2.  Her  appetite is good, and she has lost 3 and 1/2 pounds since her last visit.  She denies fever, chills or other signs of infection.  She denies nausea, vomiting, bowel issues, or abdominal pain.  She denies sore throat, cough, dyspnea, or chest pain.  REVIEW OF SYSTEMS:  Review of Systems  Constitutional: Positive for appetite change (poor) and unexpected weight change (mild weight loss). Negative for chills, fatigue and fever.  HENT:  Negative.   Eyes: Negative.   Respiratory: Negative.  Negative for chest tightness, cough, hemoptysis, shortness of breath and wheezing.   Cardiovascular: Negative.  Negative for chest pain, leg swelling and palpitations.  Gastrointestinal: Negative.  Negative for abdominal distention, abdominal pain, blood in stool, constipation, diarrhea, nausea and vomiting.  Endocrine: Negative.   Genitourinary: Negative.  Negative for difficulty urinating, dysuria, frequency and hematuria.   Musculoskeletal: Negative.  Negative for arthralgias, back pain, flank pain, gait problem and myalgias.  Skin: Negative.   Neurological: Negative.  Negative for dizziness, extremity weakness, gait problem, headaches, light-headedness, numbness, seizures and speech difficulty.  Hematological: Negative.   Psychiatric/Behavioral: Negative.  Negative for depression and sleep disturbance. The patient is not nervous/anxious.      VITALS:  Blood pressure (S) (!) 144/76, pulse 70, temperature 97.8 F (36.6 C), temperature source Oral, resp. rate 18, height 4\' 11"  (1.499 m), weight 167 lb 12.8 oz (76.1 kg), SpO2 97 %.   Wt Readings from Last 3 Encounters:  06/16/20 167 lb 12.8 oz (76.1 kg)  05/27/20 164 lb 8 oz (74.6 kg)  04/29/20 165 lb 4 oz (75 kg)    Body mass index is 33.89 kg/m.  Performance status (ECOG): 1 - Symptomatic but completely ambulatory  PHYSICAL EXAM:  Physical Exam Constitutional:      General: She is not in acute distress.    Appearance: Normal appearance. She is normal weight.  HENT:     Head: Normocephalic and atraumatic.  Eyes:     General: No scleral icterus.    Extraocular Movements: Extraocular movements intact.     Conjunctiva/sclera: Conjunctivae normal.     Pupils: Pupils are equal, round, and reactive to light.  Cardiovascular:     Rate and Rhythm: Normal rate and regular rhythm.     Pulses: Normal pulses.     Heart sounds: Murmur heard.   Systolic murmur is present with a grade of 2/6. No friction rub. No gallop.   Pulmonary:     Effort: Pulmonary effort is normal. No respiratory distress.     Breath sounds: Normal breath sounds.  Chest:  Comments: She has well healed scars of both breasts, but no masses.  There is a mild firmness in the upper outer quadrant of the left breast. Abdominal:     General: Bowel sounds are normal. There is no distension.     Palpations: Abdomen is soft. There is no hepatomegaly, splenomegaly or mass.     Tenderness: There is no abdominal tenderness.     Comments: She has a firm area in the epigastrium from prior hernia repair  Musculoskeletal:        General: Normal range of motion.     Cervical back: Normal range of motion and neck supple.     Right lower leg: Edema (mild) present.     Left lower leg: Edema (mild) present.  Lymphadenopathy:     Cervical: No cervical adenopathy.  Skin:    General: Skin is warm and dry.     Comments: She has several scattered nevi of the back which appear benign.  Neurological:     General: No focal deficit present.     Mental Status: She is alert and oriented to person, place, and time.  Mental status is at baseline.  Psychiatric:        Mood and Affect: Mood normal.        Behavior: Behavior normal.        Thought Content: Thought content normal.        Judgment: Judgment normal.     LABS:   CBC Latest Ref Rng & Units 03/24/2020 05/23/2019 02/26/2019  WBC - 7.4 6.9 8.1  Hemoglobin 12.0 - 16.0 11.8(A) 11.8 11.4(L)  Hematocrit 36 - 46 35(A) 35.6 36.3  Platelets 150 - 399 209 253 226   CMP Latest Ref Rng & Units 03/24/2020 05/23/2019 02/26/2019  Glucose 65 - 99 mg/dL - 82 100(H)  BUN 4 - 21 18 18  24(H)  Creatinine 0.5 - 1.1 1.2(A) 1.16(H) 1.08(H)  Sodium 137 - 147 137 137 137  Potassium 3.4 - 5.3 4.2 5.0 4.1  Chloride 99 - 108 103 99 102  CO2 13 - 22 28(A) 24 25  Calcium 8.7 - 10.7 9.7 9.4 9.2  Total Protein 6.0 - 8.5 g/dL - 6.6 -  Total Bilirubin 0.0 - 1.2 mg/dL - 0.6 -  Alkaline Phos 25 - 125 48 57 -  AST 13 - 35 27 21 -  ALT 7 - 35 17 24 -    STUDIES:  No results found.   Allergies:  Allergies  Allergen Reactions  . Betadine [Povidone Iodine]     blisters  . Latex Swelling    hives  . Zestril [Lisinopril]     cough    Current Medications: Current Outpatient Medications  Medication Sig Dispense Refill  . acetaminophen (TYLENOL) 500 MG tablet Take 1,000 mg by mouth daily as needed (sinus headaches).    . ALPRAZolam (XANAX) 0.25 MG tablet Take 0.25 mg by mouth 3 (three) times daily as needed for anxiety or sleep.     Marland Kitchen amLODipine (NORVASC) 2.5 MG tablet TAKE ONE (1) TABLET BY MOUTH ONCE DAILY 30 tablet 3  . atorvastatin (LIPITOR) 10 MG tablet Take 10 mg by mouth daily.    . Azelastine-Fluticasone 137-50 MCG/ACT SUSP Place 1 spray into both nostrils 2 (two) times daily.    Marland Kitchen buPROPion (WELLBUTRIN XL) 150 MG 24 hr tablet Take 150 mg by mouth every morning.    . busPIRone (BUSPAR) 5 MG tablet Take 5 mg by mouth 3 (three) times daily.    Marland Kitchen  cholecalciferol (VITAMIN D3) 25 MCG (1000 UT) tablet Take 1,000 Units by mouth daily.    . citalopram (CELEXA) 10  MG tablet Take 10 mg by mouth at bedtime.    . cloNIDine (CATAPRES) 0.1 MG tablet Take 0.05 mg by mouth 2 (two) times daily.    . diclofenac (VOLTAREN) 75 MG EC tablet TAKE ONE (1) TABLET BY MOUTH TWO (2) TIMES DAILY 180 tablet 3  . Eszopiclone 3 MG TABS Take 3 mg by mouth at bedtime.    . famotidine (PEPCID) 40 MG tablet Take by mouth.    . fluconazole (DIFLUCAN) 100 MG tablet Take 100 mg by mouth daily.    . fluticasone (FLONASE) 50 MCG/ACT nasal spray Place into both nostrils daily.    . fulvestrant (FASLODEX) 250 MG/5ML injection Inject into the muscle once. One injection each buttock over 1-2 minutes. Warm prior to use.    Marland Kitchen HYDROcodone-acetaminophen (NORCO/VICODIN) 5-325 MG tablet Take 1-2 tablets by mouth every 6 (six) hours as needed for moderate pain.    Marland Kitchen levothyroxine (SYNTHROID, LEVOTHROID) 50 MCG tablet Take 50 mcg by mouth daily before breakfast.     . LORazepam (ATIVAN) 2 MG tablet Take 2 mg by mouth at bedtime as needed.    Marland Kitchen losartan (COZAAR) 25 MG tablet Take 25-50 mg by mouth daily as needed (High BP).    . metoprolol succinate (TOPROL-XL) 25 MG 24 hr tablet Take 25 mg by mouth daily.     . montelukast (SINGULAIR) 10 MG tablet Take 10 mg by mouth at bedtime.    . nortriptyline (PAMELOR) 10 MG capsule Take by mouth.    . olmesartan (BENICAR) 40 MG tablet Take 1 tablet (40 mg total) by mouth daily. Please make overdue appt with Dr. Tamala Julian before anymore refills. Thank you 1st attempt 30 tablet 0  . polycarbophil (FIBERCON) 625 MG tablet Take 625 mg by mouth daily.    . polyethylene glycol (MIRALAX / GLYCOLAX) 17 g packet Take 17 g by mouth daily.    . temazepam (RESTORIL) 30 MG capsule Take 30 mg by mouth at bedtime as needed.    Marland Kitchen terconazole (TERAZOL 3) 0.8 % vaginal cream Place vaginally.    . vitamin B-12 (CYANOCOBALAMIN) 1000 MCG tablet Take 1,000 mcg by mouth daily.     No current facility-administered medications for this visit.     ASSESSMENT & PLAN:   Assessment:    1. Stage 0 breast cancer April 2009 of the left breast treated with lumpectomy.  2. Stage I invasive ductal carcinoma of the right breast in 2010 treated with surgery and adjuvant radiation.  3. Stage IIB left breast cancer diagnosed in December of 2014 with a 1.9 cm lesion and a 3 cm left axillary node for a T2 N1 M0.  Once again this was estrogen and progesterone receptor positive but she had not taken hormonal therapy in the past.  She declined surgery because of her comorbidities and so was placed on hormonal therapy with letrozole in 2015 until March of 2017. At that time she stopped it because of severe hot flashes and was changed to fulvestrant injections monthly as of May of 2017. She has done very well with that.  Her left breast mass had started to grow back in 2017, but is back under control, and barely even palpable now.  4. Nodular mass effect in the epigastrium which is a small ventral hernia containing fat.  She underwent hernia repair in December 2020, but I can still  feel a small nodule in the epigastrium on palpation.  5. Osteoporosis.  Bone density scan from June 2021 revealed severe worsening and she is now considered osteoporotic with a T-score of -3.2 of the left forearm.  The femur has also shown worsening but is still considered within the osteopenia range.   She will be due for repeat bone density scan in June 2023.  6. Status post TAVR 2019, and doing extremely well.   Plan: We will proceed with fulvestrant on April 12th, and continue every 4 weeks.  I will see her back in 4 months with CBC and CMP for repeat examination.  She understands and agrees with this plan of care.  She knows to call if she needs to be seen sooner.   I provided 20 minutes of face-to-face time during this this encounter and > 50% was spent counseling as documented under my assessment and plan.    Derwood Kaplan, MD [name] AT [facility] [address] [place] [address] Dept: [phone] Dept Fax: [phone]   I, Rita Ohara, am acting as scribe for Derwood Kaplan, MD  I have reviewed this report as typed by the medical scribe, and it is complete and accurate.

## 2020-06-16 ENCOUNTER — Other Ambulatory Visit: Payer: Self-pay | Admitting: Oncology

## 2020-06-16 ENCOUNTER — Encounter: Payer: Self-pay | Admitting: Oncology

## 2020-06-16 ENCOUNTER — Other Ambulatory Visit: Payer: Self-pay | Admitting: Hematology and Oncology

## 2020-06-16 ENCOUNTER — Inpatient Hospital Stay: Payer: Medicare Other | Attending: Oncology

## 2020-06-16 ENCOUNTER — Other Ambulatory Visit: Payer: Self-pay

## 2020-06-16 ENCOUNTER — Inpatient Hospital Stay (INDEPENDENT_AMBULATORY_CARE_PROVIDER_SITE_OTHER): Payer: Medicare Other | Admitting: Oncology

## 2020-06-16 VITALS — BP 144/76 | HR 70 | Temp 97.8°F | Resp 18 | Ht 59.0 in | Wt 167.8 lb

## 2020-06-16 DIAGNOSIS — Z170 Estrogen receptor positive status [ER+]: Secondary | ICD-10-CM | POA: Diagnosis not present

## 2020-06-16 DIAGNOSIS — C50112 Malignant neoplasm of central portion of left female breast: Secondary | ICD-10-CM

## 2020-06-16 DIAGNOSIS — Z79818 Long term (current) use of other agents affecting estrogen receptors and estrogen levels: Secondary | ICD-10-CM | POA: Insufficient documentation

## 2020-06-16 DIAGNOSIS — C50912 Malignant neoplasm of unspecified site of left female breast: Secondary | ICD-10-CM | POA: Insufficient documentation

## 2020-06-16 DIAGNOSIS — M810 Age-related osteoporosis without current pathological fracture: Secondary | ICD-10-CM | POA: Insufficient documentation

## 2020-06-16 DIAGNOSIS — Z853 Personal history of malignant neoplasm of breast: Secondary | ICD-10-CM

## 2020-06-16 LAB — CBC AND DIFFERENTIAL
HCT: 35 — AB (ref 36–46)
Hemoglobin: 11.6 — AB (ref 12.0–16.0)
Neutrophils Absolute: 5.31
Platelets: 223 (ref 150–399)
WBC: 6.9

## 2020-06-16 LAB — BASIC METABOLIC PANEL
BUN: 14 (ref 4–21)
CO2: 26 — AB (ref 13–22)
Chloride: 104 (ref 99–108)
Creatinine: 1.1 (ref 0.5–1.1)
Glucose: 109
Potassium: 4.1 (ref 3.4–5.3)
Sodium: 137 (ref 137–147)

## 2020-06-16 LAB — HEPATIC FUNCTION PANEL
ALT: 16 (ref 7–35)
AST: 25 (ref 13–35)
Alkaline Phosphatase: 45 (ref 25–125)
Bilirubin, Total: 1.1

## 2020-06-16 LAB — CBC
MCV: 93 (ref 81–99)
RBC: 3.77 — AB (ref 3.87–5.11)

## 2020-06-16 LAB — COMPREHENSIVE METABOLIC PANEL
Albumin: 4.1 (ref 3.5–5.0)
Calcium: 9 (ref 8.7–10.7)

## 2020-06-16 NOTE — Progress Notes (Unsigned)
t

## 2020-06-18 ENCOUNTER — Other Ambulatory Visit: Payer: Self-pay | Admitting: Oncology

## 2020-06-18 ENCOUNTER — Other Ambulatory Visit: Payer: Self-pay | Admitting: Interventional Cardiology

## 2020-06-23 ENCOUNTER — Other Ambulatory Visit: Payer: Medicare Other

## 2020-06-23 ENCOUNTER — Ambulatory Visit: Payer: Medicare Other | Admitting: Oncology

## 2020-06-24 ENCOUNTER — Ambulatory Visit: Payer: Medicare Other

## 2020-06-25 ENCOUNTER — Other Ambulatory Visit: Payer: Self-pay

## 2020-06-25 ENCOUNTER — Inpatient Hospital Stay: Payer: Medicare Other

## 2020-06-25 VITALS — BP 122/72 | HR 72 | Temp 98.6°F | Resp 18 | Ht 59.0 in | Wt 164.5 lb

## 2020-06-25 DIAGNOSIS — C50912 Malignant neoplasm of unspecified site of left female breast: Secondary | ICD-10-CM | POA: Diagnosis present

## 2020-06-25 DIAGNOSIS — Z170 Estrogen receptor positive status [ER+]: Secondary | ICD-10-CM | POA: Diagnosis not present

## 2020-06-25 DIAGNOSIS — Z79818 Long term (current) use of other agents affecting estrogen receptors and estrogen levels: Secondary | ICD-10-CM | POA: Diagnosis not present

## 2020-06-25 DIAGNOSIS — M810 Age-related osteoporosis without current pathological fracture: Secondary | ICD-10-CM | POA: Diagnosis not present

## 2020-06-25 DIAGNOSIS — C50112 Malignant neoplasm of central portion of left female breast: Secondary | ICD-10-CM

## 2020-06-25 MED ORDER — FULVESTRANT 250 MG/5ML IM SOLN
INTRAMUSCULAR | Status: AC
  Filled 2020-06-25: qty 10

## 2020-06-25 MED ORDER — FULVESTRANT 250 MG/5ML IM SOLN
500.0000 mg | Freq: Once | INTRAMUSCULAR | Status: AC
Start: 2020-06-25 — End: 2020-06-25
  Administered 2020-06-25: 500 mg via INTRAMUSCULAR

## 2020-06-25 NOTE — Progress Notes (Signed)
1319: PT STABLE AT TIME OF DISCHARGE

## 2020-06-25 NOTE — Patient Instructions (Signed)
Fulvestrant injection What is this medicine? FULVESTRANT (ful VES trant) blocks the effects of estrogen. It is used to treat breast cancer. This medicine may be used for other purposes; ask your health care provider or pharmacist if you have questions. COMMON BRAND NAME(S): FASLODEX What should I tell my health care provider before I take this medicine? They need to know if you have any of these conditions:  bleeding disorders  liver disease  low blood counts, like low white cell, platelet, or red cell counts  an unusual or allergic reaction to fulvestrant, other medicines, foods, dyes, or preservatives  pregnant or trying to get pregnant  breast-feeding How should I use this medicine? This medicine is for injection into a muscle. It is usually given by a health care professional in a hospital or clinic setting. Talk to your pediatrician regarding the use of this medicine in children. Special care may be needed. Overdosage: If you think you have taken too much of this medicine contact a poison control center or emergency room at once. NOTE: This medicine is only for you. Do not share this medicine with others. What if I miss a dose? It is important not to miss your dose. Call your doctor or health care professional if you are unable to keep an appointment. What may interact with this medicine?  medicines that treat or prevent blood clots like warfarin, enoxaparin, dalteparin, apixaban, dabigatran, and rivaroxaban This list may not describe all possible interactions. Give your health care provider a list of all the medicines, herbs, non-prescription drugs, or dietary supplements you use. Also tell them if you smoke, drink alcohol, or use illegal drugs. Some items may interact with your medicine. What should I watch for while using this medicine? Your condition will be monitored carefully while you are receiving this medicine. You will need important blood work done while you are taking  this medicine. Do not become pregnant while taking this medicine or for at least 1 year after stopping it. Women of child-bearing potential will need to have a negative pregnancy test before starting this medicine. Women should inform their doctor if they wish to become pregnant or think they might be pregnant. There is a potential for serious side effects to an unborn child. Men should inform their doctors if they wish to father a child. This medicine may lower sperm counts. Talk to your health care professional or pharmacist for more information. Do not breast-feed an infant while taking this medicine or for 1 year after the last dose. What side effects may I notice from receiving this medicine? Side effects that you should report to your doctor or health care professional as soon as possible:  allergic reactions like skin rash, itching or hives, swelling of the face, lips, or tongue  feeling faint or lightheaded, falls  pain, tingling, numbness, or weakness in the legs  signs and symptoms of infection like fever or chills; cough; flu-like symptoms; sore throat  vaginal bleeding Side effects that usually do not require medical attention (report to your doctor or health care professional if they continue or are bothersome):  aches, pains  constipation  diarrhea  headache  hot flashes  nausea, vomiting  pain at site where injected  stomach pain This list may not describe all possible side effects. Call your doctor for medical advice about side effects. You may report side effects to FDA at 1-800-FDA-1088. Where should I keep my medicine? This drug is given in a hospital or clinic and will   not be stored at home. NOTE: This sheet is a summary. It may not cover all possible information. If you have questions about this medicine, talk to your doctor, pharmacist, or health care provider.  2021 Elsevier/Gold Standard (2017-06-09 11:34:41)  

## 2020-07-22 ENCOUNTER — Other Ambulatory Visit: Payer: Self-pay | Admitting: Interventional Cardiology

## 2020-07-25 ENCOUNTER — Inpatient Hospital Stay: Payer: Medicare Other

## 2020-07-29 ENCOUNTER — Inpatient Hospital Stay: Payer: Medicare Other | Attending: Oncology

## 2020-07-29 ENCOUNTER — Other Ambulatory Visit: Payer: Self-pay

## 2020-07-29 VITALS — BP 166/76 | HR 71 | Temp 98.4°F | Resp 18 | Ht 59.0 in | Wt 165.0 lb

## 2020-07-29 DIAGNOSIS — Z79818 Long term (current) use of other agents affecting estrogen receptors and estrogen levels: Secondary | ICD-10-CM | POA: Insufficient documentation

## 2020-07-29 DIAGNOSIS — C50112 Malignant neoplasm of central portion of left female breast: Secondary | ICD-10-CM

## 2020-07-29 DIAGNOSIS — C50912 Malignant neoplasm of unspecified site of left female breast: Secondary | ICD-10-CM | POA: Diagnosis present

## 2020-07-29 DIAGNOSIS — Z170 Estrogen receptor positive status [ER+]: Secondary | ICD-10-CM | POA: Insufficient documentation

## 2020-07-29 MED ORDER — FULVESTRANT 250 MG/5ML IM SOLN
500.0000 mg | Freq: Once | INTRAMUSCULAR | Status: AC
Start: 2020-07-29 — End: 2020-07-29
  Administered 2020-07-29: 500 mg via INTRAMUSCULAR

## 2020-07-29 MED ORDER — FULVESTRANT 250 MG/5ML IM SOLN
INTRAMUSCULAR | Status: AC
  Filled 2020-07-29: qty 10

## 2020-07-29 NOTE — Patient Instructions (Signed)
Fulvestrant injection What is this medicine? FULVESTRANT (ful VES trant) blocks the effects of estrogen. It is used to treat breast cancer. This medicine may be used for other purposes; ask your health care provider or pharmacist if you have questions. COMMON BRAND NAME(S): FASLODEX What should I tell my health care provider before I take this medicine? They need to know if you have any of these conditions:  bleeding disorders  liver disease  low blood counts, like low white cell, platelet, or red cell counts  an unusual or allergic reaction to fulvestrant, other medicines, foods, dyes, or preservatives  pregnant or trying to get pregnant  breast-feeding How should I use this medicine? This medicine is for injection into a muscle. It is usually given by a health care professional in a hospital or clinic setting. Talk to your pediatrician regarding the use of this medicine in children. Special care may be needed. Overdosage: If you think you have taken too much of this medicine contact a poison control center or emergency room at once. NOTE: This medicine is only for you. Do not share this medicine with others. What if I miss a dose? It is important not to miss your dose. Call your doctor or health care professional if you are unable to keep an appointment. What may interact with this medicine?  medicines that treat or prevent blood clots like warfarin, enoxaparin, dalteparin, apixaban, dabigatran, and rivaroxaban This list may not describe all possible interactions. Give your health care provider a list of all the medicines, herbs, non-prescription drugs, or dietary supplements you use. Also tell them if you smoke, drink alcohol, or use illegal drugs. Some items may interact with your medicine. What should I watch for while using this medicine? Your condition will be monitored carefully while you are receiving this medicine. You will need important blood work done while you are taking  this medicine. Do not become pregnant while taking this medicine or for at least 1 year after stopping it. Women of child-bearing potential will need to have a negative pregnancy test before starting this medicine. Women should inform their doctor if they wish to become pregnant or think they might be pregnant. There is a potential for serious side effects to an unborn child. Men should inform their doctors if they wish to father a child. This medicine may lower sperm counts. Talk to your health care professional or pharmacist for more information. Do not breast-feed an infant while taking this medicine or for 1 year after the last dose. What side effects may I notice from receiving this medicine? Side effects that you should report to your doctor or health care professional as soon as possible:  allergic reactions like skin rash, itching or hives, swelling of the face, lips, or tongue  feeling faint or lightheaded, falls  pain, tingling, numbness, or weakness in the legs  signs and symptoms of infection like fever or chills; cough; flu-like symptoms; sore throat  vaginal bleeding Side effects that usually do not require medical attention (report to your doctor or health care professional if they continue or are bothersome):  aches, pains  constipation  diarrhea  headache  hot flashes  nausea, vomiting  pain at site where injected  stomach pain This list may not describe all possible side effects. Call your doctor for medical advice about side effects. You may report side effects to FDA at 1-800-FDA-1088. Where should I keep my medicine? This drug is given in a hospital or clinic and will   not be stored at home. NOTE: This sheet is a summary. It may not cover all possible information. If you have questions about this medicine, talk to your doctor, pharmacist, or health care provider.  2021 Elsevier/Gold Standard (2017-06-09 11:34:41)  

## 2020-08-20 ENCOUNTER — Other Ambulatory Visit: Payer: Self-pay | Admitting: Interventional Cardiology

## 2020-08-20 MED ORDER — OLMESARTAN MEDOXOMIL 40 MG PO TABS
40.0000 mg | ORAL_TABLET | Freq: Every day | ORAL | 0 refills | Status: DC
Start: 2020-08-20 — End: 2020-10-21

## 2020-08-25 ENCOUNTER — Inpatient Hospital Stay: Payer: Medicare Other

## 2020-08-26 ENCOUNTER — Telehealth: Payer: Self-pay | Admitting: Oncology

## 2020-08-26 NOTE — Telephone Encounter (Signed)
Patient rescheduled 6/15 Injection to 8/08 due to conflicts in her Appt's

## 2020-08-27 ENCOUNTER — Inpatient Hospital Stay: Payer: Medicare Other

## 2020-08-28 ENCOUNTER — Other Ambulatory Visit: Payer: Self-pay | Admitting: Pharmacist

## 2020-08-29 ENCOUNTER — Encounter (INDEPENDENT_AMBULATORY_CARE_PROVIDER_SITE_OTHER): Payer: Self-pay

## 2020-08-29 ENCOUNTER — Inpatient Hospital Stay: Payer: Medicare Other | Attending: Oncology

## 2020-08-29 ENCOUNTER — Other Ambulatory Visit: Payer: Self-pay

## 2020-08-29 VITALS — BP 155/71 | HR 75 | Temp 98.9°F | Resp 18 | Ht 59.0 in | Wt 167.0 lb

## 2020-08-29 DIAGNOSIS — Z170 Estrogen receptor positive status [ER+]: Secondary | ICD-10-CM | POA: Diagnosis not present

## 2020-08-29 DIAGNOSIS — C50112 Malignant neoplasm of central portion of left female breast: Secondary | ICD-10-CM

## 2020-08-29 DIAGNOSIS — Z79818 Long term (current) use of other agents affecting estrogen receptors and estrogen levels: Secondary | ICD-10-CM | POA: Diagnosis not present

## 2020-08-29 DIAGNOSIS — C50912 Malignant neoplasm of unspecified site of left female breast: Secondary | ICD-10-CM | POA: Diagnosis present

## 2020-08-29 MED ORDER — FULVESTRANT 250 MG/5ML IM SOLN
500.0000 mg | Freq: Once | INTRAMUSCULAR | Status: AC
Start: 2020-08-29 — End: 2020-08-29
  Administered 2020-08-29: 500 mg via INTRAMUSCULAR

## 2020-08-29 MED ORDER — FULVESTRANT 250 MG/5ML IM SOLN
INTRAMUSCULAR | Status: AC
  Filled 2020-08-29: qty 10

## 2020-08-29 NOTE — Patient Instructions (Signed)
Fulvestrant injection What is this medication? FULVESTRANT (ful VES trant) blocks the effects of estrogen. It is used to treat breast cancer. This medicine may be used for other purposes; ask your health care provider or pharmacist if you have questions. COMMON BRAND NAME(S): FASLODEX What should I tell my care team before I take this medication? They need to know if you have any of these conditions: bleeding disorders liver disease low blood counts, like low white cell, platelet, or red cell counts an unusual or allergic reaction to fulvestrant, other medicines, foods, dyes, or preservatives pregnant or trying to get pregnant breast-feeding How should I use this medication? This medicine is for injection into a muscle. It is usually given by a health care professional in a hospital or clinic setting. Talk to your pediatrician regarding the use of this medicine in children. Special care may be needed. Overdosage: If you think you have taken too much of this medicine contact a poison control center or emergency room at once. NOTE: This medicine is only for you. Do not share this medicine with others. What if I miss a dose? It is important not to miss your dose. Call your doctor or health care professional if you are unable to keep an appointment. What may interact with this medication? medicines that treat or prevent blood clots like warfarin, enoxaparin, dalteparin, apixaban, dabigatran, and rivaroxaban This list may not describe all possible interactions. Give your health care provider a list of all the medicines, herbs, non-prescription drugs, or dietary supplements you use. Also tell them if you smoke, drink alcohol, or use illegal drugs. Some items may interact with your medicine. What should I watch for while using this medication? Your condition will be monitored carefully while you are receiving this medicine. You will need important blood work done while you are taking this  medicine. Do not become pregnant while taking this medicine or for at least 1 year after stopping it. Women of child-bearing potential will need to have a negative pregnancy test before starting this medicine. Women should inform their doctor if they wish to become pregnant or think they might be pregnant. There is a potential for serious side effects to an unborn child. Men should inform their doctors if they wish to father a child. This medicine may lower sperm counts. Talk to your health care professional or pharmacist for more information. Do not breast-feed an infant while taking this medicine or for 1 year after the last dose. What side effects may I notice from receiving this medication? Side effects that you should report to your doctor or health care professional as soon as possible: allergic reactions like skin rash, itching or hives, swelling of the face, lips, or tongue feeling faint or lightheaded, falls pain, tingling, numbness, or weakness in the legs signs and symptoms of infection like fever or chills; cough; flu-like symptoms; sore throat vaginal bleeding Side effects that usually do not require medical attention (report to your doctor or health care professional if they continue or are bothersome): aches, pains constipation diarrhea headache hot flashes nausea, vomiting pain at site where injected stomach pain This list may not describe all possible side effects. Call your doctor for medical advice about side effects. You may report side effects to FDA at 1-800-FDA-1088. Where should I keep my medication? This drug is given in a hospital or clinic and will not be stored at home. NOTE: This sheet is a summary. It may not cover all possible information. If you have   questions about this medicine, talk to your doctor, pharmacist, or health care provider.  2022 Elsevier/Gold Standard (2017-06-09 11:34:41)  

## 2020-09-22 ENCOUNTER — Ambulatory Visit: Payer: Medicare Other

## 2020-09-26 ENCOUNTER — Telehealth: Payer: Self-pay | Admitting: Oncology

## 2020-09-26 ENCOUNTER — Inpatient Hospital Stay: Payer: Medicare Other | Attending: Oncology

## 2020-09-26 ENCOUNTER — Other Ambulatory Visit: Payer: Self-pay

## 2020-09-26 ENCOUNTER — Inpatient Hospital Stay: Payer: Medicare Other

## 2020-09-26 ENCOUNTER — Encounter: Payer: Self-pay | Admitting: Oncology

## 2020-09-26 VITALS — BP 154/77 | HR 75 | Temp 98.1°F | Resp 18 | Ht 59.0 in | Wt 165.2 lb

## 2020-09-26 DIAGNOSIS — Z5112 Encounter for antineoplastic immunotherapy: Secondary | ICD-10-CM | POA: Insufficient documentation

## 2020-09-26 DIAGNOSIS — Z170 Estrogen receptor positive status [ER+]: Secondary | ICD-10-CM | POA: Insufficient documentation

## 2020-09-26 DIAGNOSIS — C50112 Malignant neoplasm of central portion of left female breast: Secondary | ICD-10-CM | POA: Diagnosis present

## 2020-09-26 DIAGNOSIS — Z5111 Encounter for antineoplastic chemotherapy: Secondary | ICD-10-CM | POA: Diagnosis present

## 2020-09-26 MED ORDER — FULVESTRANT 250 MG/5ML IM SOLN
500.0000 mg | Freq: Once | INTRAMUSCULAR | Status: AC
  Administered 2020-09-26: 500 mg via INTRAMUSCULAR

## 2020-09-26 MED ORDER — FULVESTRANT 250 MG/5ML IM SOLN
INTRAMUSCULAR | Status: AC
  Filled 2020-09-26: qty 10

## 2020-09-26 NOTE — Patient Instructions (Signed)
Fulvestrant injection What is this medication? FULVESTRANT (ful VES trant) blocks the effects of estrogen. It is used to treat breast cancer. This medicine may be used for other purposes; ask your health care provider or pharmacist if you have questions. COMMON BRAND NAME(S): FASLODEX What should I tell my care team before I take this medication? They need to know if you have any of these conditions: bleeding disorders liver disease low blood counts, like low white cell, platelet, or red cell counts an unusual or allergic reaction to fulvestrant, other medicines, foods, dyes, or preservatives pregnant or trying to get pregnant breast-feeding How should I use this medication? This medicine is for injection into a muscle. It is usually given by a health care professional in a hospital or clinic setting. Talk to your pediatrician regarding the use of this medicine in children. Special care may be needed. Overdosage: If you think you have taken too much of this medicine contact a poison control center or emergency room at once. NOTE: This medicine is only for you. Do not share this medicine with others. What if I miss a dose? It is important not to miss your dose. Call your doctor or health care professional if you are unable to keep an appointment. What may interact with this medication? medicines that treat or prevent blood clots like warfarin, enoxaparin, dalteparin, apixaban, dabigatran, and rivaroxaban This list may not describe all possible interactions. Give your health care provider a list of all the medicines, herbs, non-prescription drugs, or dietary supplements you use. Also tell them if you smoke, drink alcohol, or use illegal drugs. Some items may interact with your medicine. What should I watch for while using this medication? Your condition will be monitored carefully while you are receiving this medicine. You will need important blood work done while you are taking this  medicine. Do not become pregnant while taking this medicine or for at least 1 year after stopping it. Women of child-bearing potential will need to have a negative pregnancy test before starting this medicine. Women should inform their doctor if they wish to become pregnant or think they might be pregnant. There is a potential for serious side effects to an unborn child. Men should inform their doctors if they wish to father a child. This medicine may lower sperm counts. Talk to your health care professional or pharmacist for more information. Do not breast-feed an infant while taking this medicine or for 1 year after the last dose. What side effects may I notice from receiving this medication? Side effects that you should report to your doctor or health care professional as soon as possible: allergic reactions like skin rash, itching or hives, swelling of the face, lips, or tongue feeling faint or lightheaded, falls pain, tingling, numbness, or weakness in the legs signs and symptoms of infection like fever or chills; cough; flu-like symptoms; sore throat vaginal bleeding Side effects that usually do not require medical attention (report to your doctor or health care professional if they continue or are bothersome): aches, pains constipation diarrhea headache hot flashes nausea, vomiting pain at site where injected stomach pain This list may not describe all possible side effects. Call your doctor for medical advice about side effects. You may report side effects to FDA at 1-800-FDA-1088. Where should I keep my medication? This drug is given in a hospital or clinic and will not be stored at home. NOTE: This sheet is a summary. It may not cover all possible information. If you have   questions about this medicine, talk to your doctor, pharmacist, or health care provider.  2022 Elsevier/Gold Standard (2017-06-09 11:34:41)  

## 2020-09-26 NOTE — Telephone Encounter (Signed)
Patient rescheduled today's Injection to 1:00 pm due to Transportation

## 2020-10-13 ENCOUNTER — Ambulatory Visit: Payer: Medicare Other | Admitting: Oncology

## 2020-10-17 ENCOUNTER — Ambulatory Visit: Payer: Medicare Other | Admitting: Oncology

## 2020-10-17 ENCOUNTER — Other Ambulatory Visit: Payer: Medicare Other

## 2020-10-17 ENCOUNTER — Inpatient Hospital Stay: Payer: Medicare Other | Admitting: Hematology and Oncology

## 2020-10-17 ENCOUNTER — Inpatient Hospital Stay: Payer: Medicare Other | Attending: Oncology

## 2020-10-18 ENCOUNTER — Inpatient Hospital Stay (HOSPITAL_COMMUNITY)
Admission: EM | Admit: 2020-10-18 | Discharge: 2020-10-21 | DRG: 641 | Disposition: A | Discharge status: Home / Self Care | Payer: Medicare Other | Attending: Internal Medicine | Admitting: Internal Medicine

## 2020-10-18 ENCOUNTER — Observation Stay (HOSPITAL_COMMUNITY): Payer: Medicare Other

## 2020-10-18 ENCOUNTER — Other Ambulatory Visit: Payer: Self-pay

## 2020-10-18 ENCOUNTER — Encounter (HOSPITAL_COMMUNITY): Payer: Self-pay

## 2020-10-18 ENCOUNTER — Other Ambulatory Visit: Payer: Self-pay | Admitting: Oncology

## 2020-10-18 DIAGNOSIS — R1312 Dysphagia, oropharyngeal phase: Secondary | ICD-10-CM | POA: Diagnosis present

## 2020-10-18 DIAGNOSIS — I2510 Atherosclerotic heart disease of native coronary artery without angina pectoris: Secondary | ICD-10-CM | POA: Diagnosis present

## 2020-10-18 DIAGNOSIS — N189 Chronic kidney disease, unspecified: Secondary | ICD-10-CM

## 2020-10-18 DIAGNOSIS — Z79818 Long term (current) use of other agents affecting estrogen receptors and estrogen levels: Secondary | ICD-10-CM

## 2020-10-18 DIAGNOSIS — K449 Diaphragmatic hernia without obstruction or gangrene: Secondary | ICD-10-CM | POA: Diagnosis present

## 2020-10-18 DIAGNOSIS — Z8249 Family history of ischemic heart disease and other diseases of the circulatory system: Secondary | ICD-10-CM

## 2020-10-18 DIAGNOSIS — E860 Dehydration: Secondary | ICD-10-CM | POA: Diagnosis not present

## 2020-10-18 DIAGNOSIS — E785 Hyperlipidemia, unspecified: Secondary | ICD-10-CM | POA: Diagnosis present

## 2020-10-18 DIAGNOSIS — E039 Hypothyroidism, unspecified: Secondary | ICD-10-CM | POA: Diagnosis present

## 2020-10-18 DIAGNOSIS — Z823 Family history of stroke: Secondary | ICD-10-CM

## 2020-10-18 DIAGNOSIS — N179 Acute kidney failure, unspecified: Secondary | ICD-10-CM

## 2020-10-18 DIAGNOSIS — R1310 Dysphagia, unspecified: Secondary | ICD-10-CM

## 2020-10-18 DIAGNOSIS — Z66 Do not resuscitate: Secondary | ICD-10-CM | POA: Diagnosis present

## 2020-10-18 DIAGNOSIS — Z20822 Contact with and (suspected) exposure to covid-19: Secondary | ICD-10-CM | POA: Diagnosis present

## 2020-10-18 DIAGNOSIS — Z79899 Other long term (current) drug therapy: Secondary | ICD-10-CM

## 2020-10-18 DIAGNOSIS — I10 Essential (primary) hypertension: Secondary | ICD-10-CM | POA: Diagnosis present

## 2020-10-18 DIAGNOSIS — Z952 Presence of prosthetic heart valve: Secondary | ICD-10-CM

## 2020-10-18 DIAGNOSIS — K529 Noninfective gastroenteritis and colitis, unspecified: Secondary | ICD-10-CM | POA: Diagnosis present

## 2020-10-18 DIAGNOSIS — Z90710 Acquired absence of both cervix and uterus: Secondary | ICD-10-CM

## 2020-10-18 DIAGNOSIS — Z955 Presence of coronary angioplasty implant and graft: Secondary | ICD-10-CM

## 2020-10-18 DIAGNOSIS — F419 Anxiety disorder, unspecified: Secondary | ICD-10-CM | POA: Diagnosis present

## 2020-10-18 DIAGNOSIS — N1831 Chronic kidney disease, stage 3a: Secondary | ICD-10-CM | POA: Diagnosis present

## 2020-10-18 DIAGNOSIS — I129 Hypertensive chronic kidney disease with stage 1 through stage 4 chronic kidney disease, or unspecified chronic kidney disease: Secondary | ICD-10-CM | POA: Diagnosis present

## 2020-10-18 DIAGNOSIS — Z87891 Personal history of nicotine dependence: Secondary | ICD-10-CM

## 2020-10-18 DIAGNOSIS — Z79890 Hormone replacement therapy (postmenopausal): Secondary | ICD-10-CM

## 2020-10-18 DIAGNOSIS — E876 Hypokalemia: Secondary | ICD-10-CM | POA: Diagnosis present

## 2020-10-18 DIAGNOSIS — F32A Depression, unspecified: Secondary | ICD-10-CM | POA: Diagnosis present

## 2020-10-18 DIAGNOSIS — K52839 Microscopic colitis, unspecified: Secondary | ICD-10-CM | POA: Diagnosis present

## 2020-10-18 DIAGNOSIS — Z96653 Presence of artificial knee joint, bilateral: Secondary | ICD-10-CM | POA: Diagnosis present

## 2020-10-18 DIAGNOSIS — R197 Diarrhea, unspecified: Secondary | ICD-10-CM

## 2020-10-18 DIAGNOSIS — Z803 Family history of malignant neoplasm of breast: Secondary | ICD-10-CM

## 2020-10-18 DIAGNOSIS — Z853 Personal history of malignant neoplasm of breast: Secondary | ICD-10-CM

## 2020-10-18 HISTORY — DX: Chronic kidney disease, unspecified: N17.9

## 2020-10-18 HISTORY — DX: Acute kidney failure, unspecified: N18.9

## 2020-10-18 LAB — COMPREHENSIVE METABOLIC PANEL
ALT: 13 U/L (ref 0–44)
AST: 13 U/L — ABNORMAL LOW (ref 15–41)
Albumin: 3.1 g/dL — ABNORMAL LOW (ref 3.5–5.0)
Alkaline Phosphatase: 44 U/L (ref 38–126)
Anion gap: 11 (ref 5–15)
BUN: 28 mg/dL — ABNORMAL HIGH (ref 8–23)
CO2: 17 mmol/L — ABNORMAL LOW (ref 22–32)
Calcium: 8.6 mg/dL — ABNORMAL LOW (ref 8.9–10.3)
Chloride: 108 mmol/L (ref 98–111)
Creatinine, Ser: 1.61 mg/dL — ABNORMAL HIGH (ref 0.44–1.00)
GFR, Estimated: 30 mL/min — ABNORMAL LOW (ref >60)
Glucose, Bld: 89 mg/dL (ref 70–99)
Potassium: 3.5 mmol/L (ref 3.5–5.1)
Sodium: 136 mmol/L (ref 135–145)
Total Bilirubin: 1 mg/dL (ref 0.3–1.2)
Total Protein: 6 g/dL — ABNORMAL LOW (ref 6.5–8.1)

## 2020-10-18 LAB — CBC WITH DIFFERENTIAL/PLATELET
Abs Immature Granulocytes: 0.07 10*3/uL (ref 0.00–0.07)
Basophils Absolute: 0 10*3/uL (ref 0.0–0.1)
Basophils Relative: 1 %
Eosinophils Absolute: 0.2 10*3/uL (ref 0.0–0.5)
Eosinophils Relative: 3 %
HCT: 30.2 % — ABNORMAL LOW (ref 36.0–46.0)
Hemoglobin: 10 g/dL — ABNORMAL LOW (ref 12.0–15.0)
Immature Granulocytes: 1 %
Lymphocytes Relative: 8 %
Lymphs Abs: 0.5 10*3/uL — ABNORMAL LOW (ref 0.7–4.0)
MCH: 30.7 pg (ref 26.0–34.0)
MCHC: 33.1 g/dL (ref 30.0–36.0)
MCV: 92.6 fL (ref 80.0–100.0)
Monocytes Absolute: 1.3 10*3/uL — ABNORMAL HIGH (ref 0.1–1.0)
Monocytes Relative: 20 %
Neutro Abs: 4.3 10*3/uL (ref 1.7–7.7)
Neutrophils Relative %: 67 %
Platelets: 193 10*3/uL (ref 150–400)
RBC: 3.26 MIL/uL — ABNORMAL LOW (ref 3.87–5.11)
RDW: 13.2 % (ref 11.5–15.5)
WBC: 6.4 10*3/uL (ref 4.0–10.5)
nRBC: 0 % (ref 0.0–0.2)

## 2020-10-18 LAB — C DIFFICILE QUICK SCREEN W PCR REFLEX
C Diff antigen: NEGATIVE
C Diff interpretation: NOT DETECTED
C Diff toxin: NEGATIVE

## 2020-10-18 MED ORDER — LEVOTHYROXINE SODIUM 50 MCG PO TABS
50.0000 ug | ORAL_TABLET | Freq: Every day | ORAL | Status: DC
  Administered 2020-10-19 – 2020-10-21 (×3): 50 ug via ORAL
  Filled 2020-10-18 (×3): qty 1

## 2020-10-18 MED ORDER — LACTATED RINGERS IV BOLUS
1000.0000 mL | Freq: Once | INTRAVENOUS | Status: AC
  Administered 2020-10-18: 1000 mL via INTRAVENOUS

## 2020-10-18 MED ORDER — ACETAMINOPHEN 325 MG PO TABS
650.0000 mg | ORAL_TABLET | Freq: Four times a day (QID) | ORAL | Status: DC | PRN
  Administered 2020-10-20: 650 mg via ORAL
  Filled 2020-10-18: qty 2

## 2020-10-18 MED ORDER — ATORVASTATIN CALCIUM 10 MG PO TABS
10.0000 mg | ORAL_TABLET | Freq: Every day | ORAL | Status: DC
  Administered 2020-10-19 – 2020-10-21 (×3): 10 mg via ORAL
  Filled 2020-10-18 (×3): qty 1

## 2020-10-18 MED ORDER — LACTATED RINGERS IV SOLN: INTRAVENOUS | Status: AC

## 2020-10-18 MED ORDER — ACETAMINOPHEN 650 MG RE SUPP: 650.0000 mg | Freq: Four times a day (QID) | RECTAL | Status: DC | PRN

## 2020-10-18 NOTE — ED Provider Notes (Addendum)
[name]   CSN: [id number] Arrival date & time: 10/18/20  1516     History Chief Complaint  Patient presents with   Diarrhea   Weakness    [name] is a 87 y.o. female with PMH severe aortic stenosis status post TAVR in 2019, CAD status post stent, CKD 3, HLD, breast cancer s/p lumpectomy currently only on antiestrogen therapy who presents the emergency department for evaluation of diarrhea.  Patient states that she has had diarrhea for the last 3 weeks that acutely worsened today.  She has had a previous C. difficile test that was negative and states that she is feeling weak and having difficulty walking around.  She states that she feels dehydrated.  Denies bright red blood per rectum, but endorses darker colored stools.  Denies chest pain, shortness of breath, abdominal pain, nausea, vomiting, cough, fever or other systemic symptoms.   Diarrhea Associated symptoms: no abdominal pain, no arthralgias, no chills, no fever and no vomiting   Weakness Associated symptoms: diarrhea   Associated symptoms: no abdominal pain, no arthralgias, no chest pain, no cough, no dysuria, no fever, no seizures, no shortness of breath and no vomiting       Past Medical History:  Diagnosis Date   Aortic stenosis, severe    a. 07/2017: s/p TAVR w/ an Edwards Sapien 3 THV (size 26 mm, model # [id number], serial # [id number])   CAD (coronary artery disease)    a. 2010: s/p stent to RCA   Chronic kidney disease, stage III (moderate) (HCC)    Chronic sinusitis    Depressive disorder    History of breast cancer    a. s/p lumpectomies and XRT   Hyperlipemia    Hypertension    Hypothyroid    Insomnia    Memory loss    Osteoarthritis    Reflux esophagitis     Patient Active Problem List   Diagnosis Date Noted   Acute kidney injury superimposed on chronic kidney disease ([place]) 10/18/2020   Osteoporosis 03/24/2020    Class: Chronic    Incisional hernia, without obstruction or gangrene 02/24/2019   Acute on chronic diastolic heart failure ([place]) 08/04/2017   Hyperlipemia    Hypertension    History of breast cancer    S/P TAVR (transcatheter aortic valve replacement) 08/02/2017   Other allergic rhinitis 12/27/2016   Gastroesophageal reflux disease without esophagitis 12/27/2016   Plantar fat pad atrophy 01/19/2016   Primary localized osteoarthrosis, ankle and foot 01/15/2016   Memory change 07/23/2015   Decreased hearing of both ears 06/25/2015   Abnormal gait 06/24/2015   Anxiety 06/24/2015   Malignant neoplasm of central portion of left female breast ([place]) 06/24/2015   Chronic recurrent major depressive disorder ([place]) 06/24/2015   Encounter for long-term current use of high risk medication 06/24/2015   Gastro-esophageal reflux disease with esophagitis 06/24/2015   Hypothyroidism, acquired 06/24/2015   Malaise and fatigue 06/24/2015   Primary insomnia 06/24/2015   Primary osteoarthritis involving multiple joints 06/24/2015   Stage III chronic kidney disease ([place]) 06/24/2015   Thyroid nodule 06/24/2015   Severe aortic stenosis 02/16/2013   Hyperlipidemia 02/16/2013   Essential hypertension, benign 02/16/2013   Coronary artery disease involving native coronary artery of native heart without angina pectoris    Multiple thyroid nodules 02/09/2012   Chronic cough 08/25/2011   Breast cancer, right breast ([place]) 03/24/2008    Class: History of   Ductal carcinoma in situ of  breast 06/23/2007    Class: History of    Past Surgical History:  Procedure Laterality Date   ANGIOPLASTY     BREAST LUMPECTOMY     x2   FOOT TENDON SURGERY     GALLBLADDER SURGERY     INCISIONAL HERNIA REPAIR N/A 03/01/2019   Procedure: OPEN INCISIONAL HERNIA REPAIR;  Surgeon: Armandina Gemma, MD;  Location: [facility];  Service: General;  Laterality: N/A;   INSERTION OF MESH N/A 03/01/2019   Procedure: INSERTION OF MESH;  Surgeon: Armandina Gemma,  MD;  Location: [facility];  Service: General;  Laterality: N/A;   INTRAOPERATIVE TRANSTHORACIC ECHOCARDIOGRAM N/A 08/02/2017   Procedure: INTRAOPERATIVE TRANSTHORACIC ECHOCARDIOGRAM;  Surgeon: Burnell Blanks, MD;  Location: [facility];  Service: Open Heart Surgery;  Laterality: N/A;   REPLACEMENT TOTAL KNEE BILATERAL     RIGHT/LEFT HEART CATH AND CORONARY ANGIOGRAPHY N/A 07/05/2017   Procedure: RIGHT/LEFT HEART CATH AND CORONARY ANGIOGRAPHY;  Surgeon: Belva Crome, MD;  Location: [facility];  Service: Cardiovascular;  Laterality: N/A;   SKIN CANCER EXCISION  2018   right nostril    TOTAL ABDOMINAL HYSTERECTOMY     TRANSCATHETER AORTIC VALVE REPLACEMENT, TRANSFEMORAL N/A 08/02/2017   Procedure: TRANSCATHETER AORTIC VALVE REPLACEMENT, TRANSFEMORAL;  Surgeon: Burnell Blanks, MD;  Location: [facility];  Service: Open Heart Surgery;  Laterality: N/A;     OB History   No obstetric history on file.     Family History  Problem Relation Age of Onset   Heart disease Father    Stroke Mother    Heart disease Maternal Grandfather    Breast cancer Sister     Social History   Tobacco Use   Smoking status: Former    Packs/day: 0.50    Years: 20.00    Pack years: 10.00    Types: Cigarettes    Quit date: 03/15/1964    Years since quitting: 8.6   Smokeless tobacco: Never  Vaping Use   Vaping Use: Never used  Substance Use Topics   Alcohol use: Yes    Comment: social   Drug use: No    Home Medications Prior to Admission medications   Medication Sig Start Date End Date Taking? Authorizing Provider  Eszopiclone 3 MG TABS Take 3 mg by mouth at bedtime. 05/27/20  Yes [provider]  acetaminophen (TYLENOL) 500 MG tablet Take 1,000 mg by mouth daily as needed (sinus headaches).    [provider]  ALPRAZolam Duanne Moron) 0.25 MG tablet Take 0.25 mg by mouth 3 (three) times daily as needed for anxiety or sleep.  03/06/13   [provider]  amLODipine (NORVASC)  2.5 MG tablet TAKE ONE (1) TABLET BY MOUTH ONCE DAILY 10/10/18   Kroeger, Daleen Snook M., PA-C  atorvastatin (LIPITOR) 10 MG tablet Take 10 mg by mouth daily. 01/16/20   [provider]  Azelastine-Fluticasone 137-50 MCG/ACT SUSP Place 1 spray into both nostrils 2 (two) times daily. 03/17/20   [provider]  buPROPion (WELLBUTRIN XL) 150 MG 24 hr tablet Take 150 mg by mouth every morning. 02/21/20   [provider]  busPIRone (BUSPAR) 5 MG tablet Take 5 mg by mouth 3 (three) times daily. 02/12/20   [provider]  cholecalciferol (VITAMIN D3) 25 MCG (1000 UT) tablet Take 1,000 Units by mouth daily.    [provider]  citalopram (CELEXA) 10 MG tablet Take 10 mg by mouth at bedtime. 10/25/19   [provider]  cloNIDine (CATAPRES) 0.1 MG  tablet Take 0.05 mg by mouth 2 (two) times daily. 03/18/20   [provider]  diclofenac (VOLTAREN) 75 MG EC tablet TAKE ONE (1) TABLET BY MOUTH TWO (2) TIMES DAILY 11/02/18   Eileen Stanford, PA-C  eszopiclone (LUNESTA) 1 MG TABS tablet Take 1-2 mg by mouth at bedtime. 09/19/20   [provider]  famotidine (PEPCID) 40 MG tablet Take by mouth. 10/25/19   [provider]  fluconazole (DIFLUCAN) 100 MG tablet Take 100 mg by mouth daily. 05/16/19   [provider]  fluticasone (FLONASE) 50 MCG/ACT nasal spray Place into both nostrils daily.    [provider]  fulvestrant (FASLODEX) 250 MG/5ML injection Inject into the muscle once. One injection each buttock over 1-2 minutes. Warm prior to use.    [provider]  HYDROcodone-acetaminophen (NORCO/VICODIN) 5-325 MG tablet Take 1-2 tablets by mouth every 6 (six) hours as needed for moderate pain.    [provider]  levothyroxine (SYNTHROID, LEVOTHROID) 50 MCG tablet Take 50 mcg by mouth daily before breakfast.  12/09/16   [provider]  LORazepam (ATIVAN) 2 MG tablet Take 2 mg by mouth at bedtime as needed.  02/21/20   [provider]  losartan (COZAAR) 25 MG tablet Take 25-50 mg by mouth daily as needed (High BP).    [provider]  metoprolol succinate (TOPROL-XL) 25 MG 24 hr tablet Take 25 mg by mouth daily.  10/13/16   [provider]  montelukast (SINGULAIR) 10 MG tablet Take 10 mg by mouth at bedtime.    [provider]  nortriptyline (PAMELOR) 10 MG capsule Take by mouth. 09/06/19   [provider]  olmesartan (BENICAR) 40 MG tablet Take 1 tablet (40 mg total) by mouth daily. Please keep upcoming appt in August 2022 before anymore refills. Thank you Final attempt 08/20/20   Belva Crome, MD  pantoprazole (PROTONIX) 40 MG tablet Take 40 mg by mouth daily. 09/30/20   [provider]  polycarbophil (FIBERCON) 625 MG tablet Take 625 mg by mouth daily.    [provider]  polyethylene glycol (MIRALAX / GLYCOLAX) 17 g packet Take 17 g by mouth daily.    [provider]  temazepam (RESTORIL) 30 MG capsule Take 30 mg by mouth at bedtime as needed. 02/12/20   [provider]  terconazole (TERAZOL 3) 0.8 % vaginal cream Place vaginally. 08/22/19   [provider]  vitamin B-12 (CYANOCOBALAMIN) 1000 MCG tablet Take 1,000 mcg by mouth daily.    [provider]    Allergies    Betadine [povidone iodine], Latex, and Zestril [lisinopril]  Review of Systems   Review of Systems  Constitutional:  Positive for fatigue. Negative for chills and fever.  HENT:  Negative for ear pain and sore throat.   Eyes:  Negative for pain and visual disturbance.  Respiratory:  Negative for cough and shortness of breath.   Cardiovascular:  Negative for chest pain and palpitations.  Gastrointestinal:  Positive for diarrhea. Negative for abdominal pain and vomiting.  Genitourinary:  Negative for dysuria and hematuria.  Musculoskeletal:  Negative for arthralgias and back pain.  Skin:  Negative for color change and rash.   Neurological:  Positive for weakness. Negative for seizures and syncope.  All other systems reviewed and are negative.  Physical Exam Updated Vital Signs BP (!) 127/57   Pulse 80   Temp 98.8 F (37.1 C) (Oral)   Resp 16   Ht '4\' 11"'$  (1.499 m)  Wt 71.7 kg   SpO2 92%   BMI 31.91 kg/m   Physical Exam Vitals and nursing note reviewed.  Constitutional:      General: She is not in acute distress.    Appearance: She is well-developed.  HENT:     Head: Normocephalic and atraumatic.  Eyes:     Conjunctiva/sclera: Conjunctivae normal.  Cardiovascular:     Rate and Rhythm: Normal rate and regular rhythm.     Heart sounds: No murmur heard. Pulmonary:     Effort: Pulmonary effort is normal. No respiratory distress.     Breath sounds: Normal breath sounds.  Abdominal:     Palpations: Abdomen is soft.     Tenderness: There is no abdominal tenderness.  Musculoskeletal:     Cervical back: Neck supple.  Skin:    General: Skin is warm and dry.  Neurological:     Mental Status: She is alert.    ED Results / Procedures / Treatments   Labs (all labs ordered are listed, but only abnormal results are displayed) Labs Reviewed  CBC WITH DIFFERENTIAL/PLATELET - Abnormal; Notable for the following components:      Result Value   RBC 3.26 (*)    Hemoglobin 10.0 (*)    HCT 30.2 (*)    Lymphs Abs 0.5 (*)    Monocytes Absolute 1.3 (*)    All other components within normal limits  COMPREHENSIVE METABOLIC PANEL - Abnormal; Notable for the following components:   CO2 17 (*)    BUN 28 (*)    Creatinine, Ser 1.61 (*)    Calcium 8.6 (*)    Total Protein 6.0 (*)    Albumin 3.1 (*)    AST 13 (*)    GFR, Estimated 30 (*)    All other components within normal limits  C DIFFICILE QUICK SCREEN W PCR REFLEX    SARS CORONAVIRUS 2 (TAT 6-24 HRS)  GASTROINTESTINAL PANEL BY PCR, STOOL (REPLACES STOOL CULTURE)  OCCULT BLOOD X 1 CARD TO LAB, STOOL  BASIC METABOLIC PANEL  CBC    EKG EKG  Interpretation  Date/Time:  Saturday October 18 2020 16:22:18 EDT Ventricular Rate:  64 PR Interval:  227 QRS Duration: 115 QT Interval:  417 QTC Calculation: 431 R Axis:   48 Text Interpretation: Sinus rhythm Prolonged PR interval Incomplete right bundle branch block Confirmed by [place] ([id number]) on 10/18/2020 6:59:26 PM  Radiology DG Abd 1 View  Result Date: 10/18/2020 CLINICAL DATA:  Diarrhea for several weeks EXAM: ABDOMEN - 1 VIEW COMPARISON:  None. FINDINGS: Scattered large and small bowel gas is noted. No obstructive changes are seen. Diffuse vascular calcifications and degenerative changes of lumbar spine are seen. No free air is noted. No abnormal mass or abnormal calcifications are seen. IMPRESSION: No acute abnormality in the abdomen. Electronically Signed   By: Inez Catalina M.D.   On: 10/18/2020 21:35    Procedures Procedures   Medications Ordered in ED Medications  acetaminophen (TYLENOL) tablet 650 mg (has no administration in time range)    Or  acetaminophen (TYLENOL) suppository 650 mg (has no administration in time range)  lactated ringers infusion ( Intravenous New Bag/Given 10/18/20 2027)  atorvastatin (LIPITOR) tablet 10 mg (has no administration in time range)  levothyroxine (SYNTHROID) tablet 50 mcg (has no administration in time range)  lactated ringers bolus 1,000 mL (0 mLs Intravenous Stopped 10/18/20 1705)    ED Course  I have reviewed the triage vital signs and the nursing notes.  Pertinent labs & imaging results that were available during my care of the patient were reviewed by me and considered in my medical decision making (see chart for details).    MDM Rules/Calculators/A&P                           Patient seen the emergency department for evaluation of diarrhea.  Physical exam is largely unremarkable with minimal abdominal tenderness, cardiopulmonary exam unremarkable.  Laboratory evaluation reveals an elevated creatinine to 1.61 and a BUN  elevation to 28 which is off of the patient's baseline of 1.1.  CBC with a hemoglobin of 10.0 which is down from 11.6 3 months ago.  C. difficile negative here in the emergency department.  Patient was given 1 L lactated Ringer's for fluid resuscitation due to persistent diarrhea.  Patient will require admission for further management of the patient's acute kidney injury and possible work-up of subacute GI bleed as source of persistent diarrhea.  Final Clinical Impression(s) / ED Diagnoses Final diagnoses:  Diarrhea    Rx / DC Orders ED Discharge Orders     None        Jadis Mika, Debe Coder, MD 10/18/20 1836    Teressa Lower, MD 10/18/20 2310

## 2020-10-18 NOTE — ED Triage Notes (Signed)
Patient c/o 3 weeks of diarrhea. States she is awaiting the results of a c-diff test, has gone ~ 7 times today.

## 2020-10-18 NOTE — H&P (Addendum)
History and Physical    [name] [id number] DOB: 06-27-1925 DOA: 10/18/2020  PCP: Ernestene Kiel, MD  Patient coming from: Home  I have personally briefly reviewed patient's old medical records in [place]  Chief Complaint: Diarrhea  HPI: [name] is a 87 y.o. female with medical history significant for CAD s/p stent to RCA, AAS s/p TAVR, CKD stage IIIa, HTN, HLD, hypothyroidism, depression/anxiety, multiple breast cancer currently managed with fulvestrant who presented to the ED for evaluation of diarrhea and fatigue/dehydration.  Patient states that about 3 weeks ago she began to have frequent watery diarrhea initially occurring 3-4 times per day.  She said symptoms would intermittently resolve for a few days before returning.  She has not seen any obvious blood in her stool although darkening stools reported to ED provider by family.  She has not associated her loose stools with any dietary changes or certain foods.  She says that she had an outpatient C. difficile test which was negative.  She was placed on an oral antibiotic which she believes with a Z-Pak which she completed after about 5-day course.  She has continued loose stools which this morning worsened and has occurred about 7 times so far.  She has felt some fatigue and weakness but denies any lightheadedness/dizziness.  She otherwise denies any abdominal pain, subjective fevers, chills, diaphoresis, chest pain, dyspnea, dysuria.  She denies use of laxatives/stool softeners.  ED Course:  Initial vitals showed BP 105/53, pulse 66, RR 18, temp 98.8 F, SPO2 96% on room air.  Labs show WBC 6.4, hemoglobin 10.0, platelets 193,000, sodium 136, potassium 3.5, bicarb 17, BUN 28, creatinine 1.61 (baseline appears to be 1.1-1.2), serum glucose 89.  C. difficile panel is negative.  Patient was given 1 L LR.  The hospitalist service was consulted to admit for further evaluation and  management.  Review of Systems: All systems reviewed and are negative except as documented in history of present illness above.   Past Medical History:  Diagnosis Date   Aortic stenosis, severe    a. 07/2017: s/p TAVR w/ an Edwards Sapien 3 THV (size 26 mm, model # [id number], serial # [id number])   CAD (coronary artery disease)    a. 2010: s/p stent to RCA   Chronic kidney disease, stage III (moderate) (HCC)    Chronic sinusitis    Depressive disorder    History of breast cancer    a. s/p lumpectomies and XRT   Hyperlipemia    Hypertension    Hypothyroid    Insomnia    Memory loss    Osteoarthritis    Reflux esophagitis     Past Surgical History:  Procedure Laterality Date   ANGIOPLASTY     BREAST LUMPECTOMY     x2   FOOT TENDON SURGERY     GALLBLADDER SURGERY     INCISIONAL HERNIA REPAIR N/A 03/01/2019   Procedure: OPEN INCISIONAL HERNIA REPAIR;  Surgeon: Armandina Gemma, MD;  Location: [facility];  Service: General;  Laterality: N/A;   INSERTION OF MESH N/A 03/01/2019   Procedure: INSERTION OF MESH;  Surgeon: Armandina Gemma, MD;  Location: [facility];  Service: General;  Laterality: N/A;   INTRAOPERATIVE TRANSTHORACIC ECHOCARDIOGRAM N/A 08/02/2017   Procedure: INTRAOPERATIVE TRANSTHORACIC ECHOCARDIOGRAM;  Surgeon: Burnell Blanks, MD;  Location: [facility];  Service: Open Heart Surgery;  Laterality: N/A;   REPLACEMENT TOTAL KNEE BILATERAL     RIGHT/LEFT HEART CATH AND CORONARY ANGIOGRAPHY N/A 07/05/2017  Procedure: RIGHT/LEFT HEART CATH AND CORONARY ANGIOGRAPHY;  Surgeon: Belva Crome, MD;  Location: [facility];  Service: Cardiovascular;  Laterality: N/A;   SKIN CANCER EXCISION  2018   right nostril    TOTAL ABDOMINAL HYSTERECTOMY     TRANSCATHETER AORTIC VALVE REPLACEMENT, TRANSFEMORAL N/A 08/02/2017   Procedure: TRANSCATHETER AORTIC VALVE REPLACEMENT, TRANSFEMORAL;  Surgeon: Burnell Blanks, MD;  Location: [facility];  Service: Open Heart Surgery;  Laterality: N/A;     Social History:  reports that she quit smoking about 56 years ago. Her smoking use included cigarettes. She has a 10.00 pack-year smoking history. She has never used smokeless tobacco. She reports current alcohol use. She reports that she does not use drugs.  Allergies  Allergen Reactions   Betadine [Povidone Iodine] Other (See Comments)    Blisters    Latex Hives and Swelling   Zestril [Lisinopril] Cough    Family History  Problem Relation Age of Onset   Heart disease Father    Stroke Mother    Heart disease Maternal Grandfather    Breast cancer Sister      Prior to Admission medications   Medication Sig Start Date End Date Taking? Authorizing Provider  acetaminophen (TYLENOL) 500 MG tablet Take 1,000 mg by mouth daily as needed (sinus headaches).    [provider]  ALPRAZolam Duanne Moron) 0.25 MG tablet Take 0.25 mg by mouth 3 (three) times daily as needed for anxiety or sleep.  03/06/13   [provider]  amLODipine (NORVASC) 2.5 MG tablet TAKE ONE (1) TABLET BY MOUTH ONCE DAILY 10/10/18   Kroeger, Daleen Snook M., PA-C  atorvastatin (LIPITOR) 10 MG tablet Take 10 mg by mouth daily. 01/16/20   [provider]  Azelastine-Fluticasone 137-50 MCG/ACT SUSP Place 1 spray into both nostrils 2 (two) times daily. 03/17/20   [provider]  buPROPion (WELLBUTRIN XL) 150 MG 24 hr tablet Take 150 mg by mouth every morning. 02/21/20   [provider]  busPIRone (BUSPAR) 5 MG tablet Take 5 mg by mouth 3 (three) times daily. 02/12/20   [provider]  cholecalciferol (VITAMIN D3) 25 MCG (1000 UT) tablet Take 1,000 Units by mouth daily.    [provider]  citalopram (CELEXA) 10 MG tablet Take 10 mg by mouth at bedtime. 10/25/19   [provider]  cloNIDine (CATAPRES) 0.1 MG tablet Take 0.05 mg by mouth 2 (two) times daily. 03/18/20   [provider]  diclofenac (VOLTAREN) 75 MG EC tablet TAKE ONE (1) TABLET BY MOUTH TWO (2)  TIMES DAILY 11/02/18   Eileen Stanford, PA-C  Eszopiclone 3 MG TABS Take 3 mg by mouth at bedtime. 05/27/20   [provider]  famotidine (PEPCID) 40 MG tablet Take by mouth. 10/25/19   [provider]  fluconazole (DIFLUCAN) 100 MG tablet Take 100 mg by mouth daily. 05/16/19   [provider]  fluticasone (FLONASE) 50 MCG/ACT nasal spray Place into both nostrils daily.    [provider]  fulvestrant (FASLODEX) 250 MG/5ML injection Inject into the muscle once. One injection each buttock over 1-2 minutes. Warm prior to use.    [provider]  HYDROcodone-acetaminophen (NORCO/VICODIN) 5-325 MG tablet Take 1-2 tablets by mouth every 6 (six) hours as needed for moderate pain.    [provider]  levothyroxine (SYNTHROID, LEVOTHROID) 50 MCG tablet Take 50 mcg by mouth daily before breakfast.  12/09/16   [provider]  LORazepam (ATIVAN) 2 MG tablet Take  2 mg by mouth at bedtime as needed. 02/21/20   [provider]  losartan (COZAAR) 25 MG tablet Take 25-50 mg by mouth daily as needed (High BP).    [provider]  metoprolol succinate (TOPROL-XL) 25 MG 24 hr tablet Take 25 mg by mouth daily.  10/13/16   [provider]  montelukast (SINGULAIR) 10 MG tablet Take 10 mg by mouth at bedtime.    [provider]  nortriptyline (PAMELOR) 10 MG capsule Take by mouth. 09/06/19   [provider]  olmesartan (BENICAR) 40 MG tablet Take 1 tablet (40 mg total) by mouth daily. Please keep upcoming appt in August 2022 before anymore refills. Thank you Final attempt 08/20/20   Belva Crome, MD  polycarbophil (FIBERCON) 625 MG tablet Take 625 mg by mouth daily.    [provider]  polyethylene glycol (MIRALAX / GLYCOLAX) 17 g packet Take 17 g by mouth daily.    [provider]  temazepam (RESTORIL) 30 MG capsule Take 30 mg by mouth at bedtime as needed. 02/12/20   [provider]   terconazole (TERAZOL 3) 0.8 % vaginal cream Place vaginally. 08/22/19   [provider]  vitamin B-12 (CYANOCOBALAMIN) 1000 MCG tablet Take 1,000 mcg by mouth daily.    [provider]    Physical Exam: Vitals:   10/18/20 1537 10/18/20 1645 10/18/20 1829 10/18/20 2027  BP:  (!) 124/54 121/61 (!) 124/55  Pulse:  73 70 74  Resp:  (!) 21 14 (!) 25  Temp:      TempSrc:      SpO2:  94% 94% 94%  Weight: 71.7 kg     Height: '4\' 11"'$  (1.499 m)      Constitutional: Elderly woman resting supine in bed, NAD, calm, comfortable Eyes: PERRL, lids and conjunctivae normal ENMT: Mucous membranes are dry. Posterior pharynx clear of any exudate or lesions.Normal dentition.  Neck: normal, supple, no masses. Respiratory: clear to auscultation bilaterally, no wheezing, no crackles. Normal respiratory effort. No accessory muscle use.  Cardiovascular: Regular rate and rhythm, soft systolic murmur. No extremity edema. 2+ pedal pulses. Abdomen: no tenderness, no masses palpated. No hepatosplenomegaly. Bowel sounds positive.  Musculoskeletal: no clubbing / cyanosis. No joint deformity upper and lower extremities. Good ROM, no contractures. Normal muscle tone.  Skin: no rashes, lesions, ulcers. No induration Neurologic: CN 2-12 grossly intact. Sensation intact. Strength 5/5 in all 4.  Psychiatric: Normal judgment and insight. Alert and oriented x 3. Normal mood.   Labs on Admission: I have personally reviewed following labs and imaging studies  CBC: Recent Labs  Lab 10/18/20 1615  WBC 6.4  NEUTROABS 4.3  HGB 10.0*  HCT 30.2*  MCV 92.6  PLT 0000000   Basic Metabolic Panel: Recent Labs  Lab 10/18/20 1615  NA 136  K 3.5  CL 108  CO2 17*  GLUCOSE 89  BUN 28*  CREATININE 1.61*  CALCIUM 8.6*   GFR: Estimated Creatinine Clearance: 18.8 mL/min (A) (by C-G formula based on SCr of 1.61 mg/dL (H)). Liver Function Tests: Recent Labs  Lab 10/18/20 1615  AST 13*  ALT 13  ALKPHOS 44   BILITOT 1.0  PROT 6.0*  ALBUMIN 3.1*   No results for input(s): LIPASE, AMYLASE in the last 168 hours. No results for input(s): AMMONIA in the last 168 hours. Coagulation Profile: No results for input(s): INR, PROTIME in the last 168 hours. Cardiac Enzymes: No results for input(s): CKTOTAL, CKMB, CKMBINDEX, TROPONINI in the last  168 hours. BNP (last 3 results) No results for input(s): PROBNP in the last 8760 hours. HbA1C: No results for input(s): HGBA1C in the last 72 hours. CBG: No results for input(s): GLUCAP in the last 168 hours. Lipid Profile: No results for input(s): CHOL, HDL, LDLCALC, TRIG, CHOLHDL, LDLDIRECT in the last 72 hours. Thyroid Function Tests: No results for input(s): TSH, T4TOTAL, FREET4, T3FREE, THYROIDAB in the last 72 hours. Anemia Panel: No results for input(s): VITAMINB12, FOLATE, FERRITIN, TIBC, IRON, RETICCTPCT in the last 72 hours. Urine analysis:    Component Value Date/Time   COLORURINE STRAW (A) 08/02/2017 1042   APPEARANCEUR CLEAR 08/02/2017 1042   LABSPEC 1.008 08/02/2017 1042   PHURINE 6.0 08/02/2017 1042   GLUCOSEU NEGATIVE 08/02/2017 1042   HGBUR SMALL (A) 08/02/2017 1042   BILIRUBINUR NEGATIVE 08/02/2017 1042   KETONESUR NEGATIVE 08/02/2017 1042   PROTEINUR NEGATIVE 08/02/2017 1042   NITRITE NEGATIVE 08/02/2017 1042   LEUKOCYTESUR MODERATE (A) 08/02/2017 1042    Radiological Exams on Admission: No results found.  EKG: Personally reviewed. Normal sinus rhythm with first-degree AV block, incomplete RBBB.  Similar to prior.  Assessment/Plan Principal Problem:   Acute kidney injury superimposed on chronic kidney disease (HCC) Active Problems:   Coronary artery disease involving native coronary artery of native heart without angina pectoris   Hyperlipidemia   Essential hypertension, benign   S/P TAVR (transcatheter aortic valve replacement)   History of breast cancer   Hypothyroidism, acquired   [name] is a 87  y.o. female with medical history significant for CAD s/p stent to RCA, AAS s/p TAVR, CKD stage IIIa, HTN, HLD, hypothyroidism, depression/anxiety, multiple breast cancer currently managed with fulvestrant who is admitted with AKI on CKD stage III.  Acute kidney injury superimposed on CKD stage IIIa: Suspect prerenal related to GI losses. -Continue IV fluid hydration overnight and repeat labs in a.m. -Hold any ACE/ARB, NSAIDs, nephrotoxic agents  Subacute diarrhea: Ongoing over the last 3 weeks per patient.  No clear etiology.  Seems less likely infectious however will check GI pathogen panel.  Denies use of laxatives/stool softeners or association with food/dairy.  C. difficile test is negative.  There was report of dark stools without BRBPR. -Follow GI pathogen panel -KUB -FOBT  CAD s/p stent to the RCA Aortic stenosis s/p TAVR: Stable, denies any chest pain.  Does not appear to be on antiplatelets.  Continue atorvastatin.  Hypertension: BP stable, holding antihypertensives.  Medication reconciliation pending.  Hyperlipidemia: Continue statin.  Hypothyroidism: Continue Synthroid.  Breast cancer: Follows with oncology, Dr. Hinton Rao.  Currently managed with fulvestrant.  Depression/anxiety: Resume home meds pending medication reconciliation.  DVT prophylaxis: SCDs only for now in case of occult GI bleed Code Status: DNR, confirmed with patient Family Communication: Discussed with patient, she has discussed with family Disposition Plan: From home/retirement community and likely return to previous environment pending clinical progress  Consults called: None Level of care: Med-Surg Admission status:  Status is: Observation  The patient remains OBS appropriate and will d/c before 2 midnights.  Dispo: The patient is from: Home              Anticipated d/c is to: Home              Patient currently is not medically stable to d/c.   Difficult to place patient No  Zada Finders  MD [facility]  If 7PM-7AM, please contact night-coverage [email or web address]  10/18/2020, 9:00 PM

## 2020-10-19 DIAGNOSIS — K52839 Microscopic colitis, unspecified: Secondary | ICD-10-CM | POA: Diagnosis present

## 2020-10-19 DIAGNOSIS — Z87891 Personal history of nicotine dependence: Secondary | ICD-10-CM | POA: Diagnosis not present

## 2020-10-19 DIAGNOSIS — Z952 Presence of prosthetic heart valve: Secondary | ICD-10-CM | POA: Diagnosis not present

## 2020-10-19 DIAGNOSIS — I2510 Atherosclerotic heart disease of native coronary artery without angina pectoris: Secondary | ICD-10-CM | POA: Diagnosis present

## 2020-10-19 DIAGNOSIS — Z803 Family history of malignant neoplasm of breast: Secondary | ICD-10-CM | POA: Diagnosis not present

## 2020-10-19 DIAGNOSIS — Z955 Presence of coronary angioplasty implant and graft: Secondary | ICD-10-CM | POA: Diagnosis not present

## 2020-10-19 DIAGNOSIS — Z853 Personal history of malignant neoplasm of breast: Secondary | ICD-10-CM | POA: Diagnosis not present

## 2020-10-19 DIAGNOSIS — E039 Hypothyroidism, unspecified: Secondary | ICD-10-CM | POA: Diagnosis present

## 2020-10-19 DIAGNOSIS — N189 Chronic kidney disease, unspecified: Secondary | ICD-10-CM | POA: Diagnosis not present

## 2020-10-19 DIAGNOSIS — N179 Acute kidney failure, unspecified: Secondary | ICD-10-CM

## 2020-10-19 DIAGNOSIS — E860 Dehydration: Secondary | ICD-10-CM | POA: Diagnosis present

## 2020-10-19 DIAGNOSIS — E785 Hyperlipidemia, unspecified: Secondary | ICD-10-CM | POA: Diagnosis present

## 2020-10-19 DIAGNOSIS — Z8249 Family history of ischemic heart disease and other diseases of the circulatory system: Secondary | ICD-10-CM | POA: Diagnosis not present

## 2020-10-19 DIAGNOSIS — Z66 Do not resuscitate: Secondary | ICD-10-CM | POA: Diagnosis present

## 2020-10-19 DIAGNOSIS — I129 Hypertensive chronic kidney disease with stage 1 through stage 4 chronic kidney disease, or unspecified chronic kidney disease: Secondary | ICD-10-CM | POA: Diagnosis present

## 2020-10-19 DIAGNOSIS — R197 Diarrhea, unspecified: Secondary | ICD-10-CM | POA: Diagnosis present

## 2020-10-19 DIAGNOSIS — K449 Diaphragmatic hernia without obstruction or gangrene: Secondary | ICD-10-CM | POA: Diagnosis present

## 2020-10-19 DIAGNOSIS — Z96653 Presence of artificial knee joint, bilateral: Secondary | ICD-10-CM | POA: Diagnosis present

## 2020-10-19 DIAGNOSIS — Z823 Family history of stroke: Secondary | ICD-10-CM | POA: Diagnosis not present

## 2020-10-19 DIAGNOSIS — N1831 Chronic kidney disease, stage 3a: Secondary | ICD-10-CM | POA: Diagnosis present

## 2020-10-19 DIAGNOSIS — F32A Depression, unspecified: Secondary | ICD-10-CM | POA: Diagnosis present

## 2020-10-19 DIAGNOSIS — F419 Anxiety disorder, unspecified: Secondary | ICD-10-CM | POA: Diagnosis present

## 2020-10-19 DIAGNOSIS — Z20822 Contact with and (suspected) exposure to covid-19: Secondary | ICD-10-CM | POA: Diagnosis present

## 2020-10-19 DIAGNOSIS — Z90710 Acquired absence of both cervix and uterus: Secondary | ICD-10-CM | POA: Diagnosis not present

## 2020-10-19 DIAGNOSIS — K529 Noninfective gastroenteritis and colitis, unspecified: Secondary | ICD-10-CM | POA: Diagnosis present

## 2020-10-19 HISTORY — DX: Dehydration: E86.0

## 2020-10-19 LAB — CBC
HCT: 32.5 % — ABNORMAL LOW (ref 36.0–46.0)
Hemoglobin: 10.6 g/dL — ABNORMAL LOW (ref 12.0–15.0)
MCH: 30.3 pg (ref 26.0–34.0)
MCHC: 32.6 g/dL (ref 30.0–36.0)
MCV: 92.9 fL (ref 80.0–100.0)
Platelets: 216 10*3/uL (ref 150–400)
RBC: 3.5 MIL/uL — ABNORMAL LOW (ref 3.87–5.11)
RDW: 13 % (ref 11.5–15.5)
WBC: 7.7 10*3/uL (ref 4.0–10.5)
nRBC: 0 % (ref 0.0–0.2)

## 2020-10-19 LAB — GASTROINTESTINAL PANEL BY PCR, STOOL (REPLACES STOOL CULTURE)

## 2020-10-19 LAB — BASIC METABOLIC PANEL
Anion gap: 8 (ref 5–15)
BUN: 21 mg/dL (ref 8–23)
CO2: 21 mmol/L — ABNORMAL LOW (ref 22–32)
Calcium: 8.7 mg/dL — ABNORMAL LOW (ref 8.9–10.3)
Chloride: 105 mmol/L (ref 98–111)
Creatinine, Ser: 1.07 mg/dL — ABNORMAL HIGH (ref 0.44–1.00)
GFR, Estimated: 48 mL/min — ABNORMAL LOW (ref >60)
Glucose, Bld: 119 mg/dL — ABNORMAL HIGH (ref 70–99)
Potassium: 3.4 mmol/L — ABNORMAL LOW (ref 3.5–5.1)
Sodium: 134 mmol/L — ABNORMAL LOW (ref 135–145)

## 2020-10-19 LAB — TYPE AND SCREEN
ABO/RH(D): O POS
Antibody Screen: NEGATIVE

## 2020-10-19 LAB — SARS CORONAVIRUS 2 (TAT 6-24 HRS): SARS Coronavirus 2: NEGATIVE

## 2020-10-19 LAB — OCCULT BLOOD X 1 CARD TO LAB, STOOL: Fecal Occult Bld: POSITIVE — AB

## 2020-10-19 MED ORDER — POTASSIUM CHLORIDE CRYS ER 20 MEQ PO TBCR
20.0000 meq | EXTENDED_RELEASE_TABLET | Freq: Two times a day (BID) | ORAL | Status: DC
  Administered 2020-10-19 (×2): 20 meq via ORAL
  Filled 2020-10-19 (×2): qty 1

## 2020-10-19 MED ORDER — DIPHENHYDRAMINE HCL 25 MG PO CAPS
25.0000 mg | ORAL_CAPSULE | Freq: Once | ORAL | Status: AC
  Administered 2020-10-19: 25 mg via ORAL
  Filled 2020-10-19: qty 1

## 2020-10-19 MED ORDER — LACTATED RINGERS IV SOLN: INTRAVENOUS | Status: AC

## 2020-10-19 MED ORDER — LOPERAMIDE HCL 2 MG PO CAPS
2.0000 mg | ORAL_CAPSULE | Freq: Four times a day (QID) | ORAL | Status: DC | PRN
  Administered 2020-10-19: 2 mg via ORAL
  Filled 2020-10-19: qty 1

## 2020-10-19 NOTE — Progress Notes (Signed)
PROGRESS NOTE    [name]  [id number] DOB: 06/05/1925 DOA: 10/18/2020 PCP: Ernestene Kiel, MD    Brief Narrative:  87 year old female with medical history of CAD s/p stent to RCA, TAVR, CKDstage3a, hypertension, hypothyroidism, depression/anxiety, breast cancer on fulvestrant presenting to the emergency room with dehydration, diarrhea fatigue.  Started about 3 weeks ago with frequent watery diarrhea occurring 3-4 times per day.  She had used 1 rounds of Z-Pak, for unknown reason last week.  Continue to have loose watery stool, 7 times on the day of admission.  Denies any nausea vomiting abdominal pain cramping.  Denies any blood or mucus.  Fatigue and lightheadedness dizziness.  In the emergency room hemodynamically stable.  On room air.  Creatinine 1.61 with baseline of 1.1.  C. difficile negative.  Admitted with significant dehydration and chronic diarrhea.   Assessment & Plan:   Principal Problem:   Acute kidney injury superimposed on chronic kidney disease (HCC) Active Problems:   Coronary artery disease involving native coronary artery of native heart without angina pectoris   Hyperlipidemia   Essential hypertension, benign   S/P TAVR (transcatheter aortic valve replacement)   History of breast cancer   Hypothyroidism, acquired  Acute kidney injury superimposed on chronic kidney disease stage IIIa: Suspect due to ongoing chronic fluid loss from diarrhea.  Improving with isotonic fluid.  We will continue on IV fluids today.  Encourage oral intake.  Recheck levels tomorrow morning.  Subacute/chronic diarrhea: Primary cause unknown.  Not able to find responsible medications causing it. C. difficile negative. GI pathogen panel pending.  Symptomatic treatment.  She can use Imodium.  Essential hypertension: Blood pressure fairly stable.  Med rec pending.  Coronary artery disease status post RCA stent, aortic stenosis status post TAVR: Stable.  On a statin.  Not on  any antiplatelet therapy.  Hypothyroidism: On Synthroid.  Adequate.  Breast cancer on fulvestrant: Continue.  Takes injection every month and follows with Dr. Hinton Rao.  Anxiety/depression: On multiple medications.  A lot of duplicate medications.  Pharmacy to help titrate.  Will resume once done.   DVT prophylaxis: SCDs Start: 10/18/20 2010   Code Status: DNR Family Communication: Daughter on the phone Disposition Plan: Status is: Observation  The patient will require care spanning > 2 midnights and should be moved to inpatient because: IV treatments appropriate due to intensity of illness or inability to take PO and Inpatient level of care appropriate due to severity of illness  Dispo: The patient is from: Home              Anticipated d/c is to: Home              Patient currently is not medically stable to d/c.   Difficult to place patient No         Consultants:  None  Procedures:  None  Antimicrobials:  None   Subjective: Patient seen and examined.  Sitting in the couch.  Denies any nausea vomiting.  She had 2 bowel movement since last 24 hours.  She was not sure it is slowed down.    Objective: Vitals:   10/19/20 0326 10/19/20 0330 10/19/20 0414 10/19/20 1014  BP:  (!) 130/54 (!) 139/54 (!) 157/79  Pulse: 79 80 90 95  Resp: (!) 21 (!) 23 17   Temp:   98.5 F (36.9 C) 98.9 F (37.2 C)  TempSrc:   Oral Oral  SpO2: 94% 93% 96% 96%  Weight:  Height:        Intake/Output Summary (Last 24 hours) at 10/19/2020 1130 Last data filed at 10/19/2020 1000 Gross per 24 hour  Intake 1424.72 ml  Output 0 ml  Net 1424.72 ml   Filed Weights   10/18/20 1537  Weight: 71.7 kg    Examination:  General: Looks fairly comfortable on room air. Cardiovascular: S1-S2 normal.  Regular rate rhythm. Respiratory: Bilateral clear.  No added sounds. Gastrointestinal: Soft and nontender.  Bowel sounds present. Ext: No cyanosis or edema.     Data Reviewed: I have  personally reviewed following labs and imaging studies  CBC: Recent Labs  Lab 10/18/20 1615 10/19/20 0432  WBC 6.4 7.7  NEUTROABS 4.3  --   HGB 10.0* 10.6*  HCT 30.2* 32.5*  MCV 92.6 92.9  PLT 193 [id number]   Basic Metabolic Panel: Recent Labs  Lab 10/18/20 1615 10/19/20 0432  NA 136 134*  K 3.5 3.4*  CL 108 105  CO2 17* 21*  GLUCOSE 89 119*  BUN 28* 21  CREATININE 1.61* 1.07*  CALCIUM 8.6* 8.7*   GFR: Estimated Creatinine Clearance: 28.3 mL/min (A) (by C-G formula based on SCr of 1.07 mg/dL (H)). Liver Function Tests: Recent Labs  Lab 10/18/20 1615  AST 13*  ALT 13  ALKPHOS 44  BILITOT 1.0  PROT 6.0*  ALBUMIN 3.1*   No results for input(s): LIPASE, AMYLASE in the last 168 hours. No results for input(s): AMMONIA in the last 168 hours. Coagulation Profile: No results for input(s): INR, PROTIME in the last 168 hours. Cardiac Enzymes: No results for input(s): CKTOTAL, CKMB, CKMBINDEX, TROPONINI in the last 168 hours. BNP (last 3 results) No results for input(s): PROBNP in the last 8760 hours. HbA1C: No results for input(s): HGBA1C in the last 72 hours. CBG: No results for input(s): GLUCAP in the last 168 hours. Lipid Profile: No results for input(s): CHOL, HDL, LDLCALC, TRIG, CHOLHDL, LDLDIRECT in the last 72 hours. Thyroid Function Tests: No results for input(s): TSH, T4TOTAL, FREET4, T3FREE, THYROIDAB in the last 72 hours. Anemia Panel: No results for input(s): VITAMINB12, FOLATE, FERRITIN, TIBC, IRON, RETICCTPCT in the last 72 hours. Sepsis Labs: No results for input(s): PROCALCITON, LATICACIDVEN in the last 168 hours.  Recent Results (from the past 240 hour(s))  C Difficile Quick Screen w PCR reflex     Status: None   Collection Time: 10/18/20  5:04 PM   Specimen: STOOL  Result Value Ref Range Status   C Diff antigen NEGATIVE NEGATIVE Final   C Diff toxin NEGATIVE NEGATIVE Final   C Diff interpretation No C. difficile detected.  Final    Comment:  Performed at [facility], [place] [address], [place], [place] [address]  SARS CORONAVIRUS 2 (TAT 6-24 HRS) Nasopharyngeal Nasopharyngeal Swab     Status: None   Collection Time: 10/18/20  7:28 PM   Specimen: Nasopharyngeal Swab  Result Value Ref Range Status   SARS Coronavirus 2 NEGATIVE NEGATIVE Final    Comment: (NOTE) SARS-CoV-2 target nucleic acids are NOT DETECTED.  The SARS-CoV-2 RNA is generally detectable in upper and lower respiratory specimens during the acute phase of infection. Negative results do not preclude SARS-CoV-2 infection, do not rule out co-infections with other pathogens, and should not be used as the sole basis for treatment or other patient management decisions. Negative results must be combined with clinical observations, patient history, and epidemiological information. The expected result is Negative.  Fact Sheet for Patients: SugarRoll.be  Fact Sheet  for Healthcare Providers: https://www.woods-mathews.com/  This test is not yet approved or cleared by the [place] and  has been authorized for detection and/or diagnosis of SARS-CoV-2 by FDA under an Emergency Use Authorization (EUA). This EUA will remain  in effect (meaning this test can be used) for the duration of the COVID-19 declaration under Se ction 564(b)(1) of the Act, 21 U.S.C. section 360bbb-3(b)(1), unless the authorization is terminated or revoked sooner.  Performed at [facility], [place] [address] [place], [place] [address]          Radiology Studies: DG Abd 1 View  Result Date: 10/18/2020 CLINICAL DATA:  Diarrhea for several weeks EXAM: ABDOMEN - 1 VIEW COMPARISON:  None. FINDINGS: Scattered large and small bowel gas is noted. No obstructive changes are seen. Diffuse vascular calcifications and degenerative changes of lumbar spine are seen. No free air is noted. No abnormal mass or abnormal calcifications are  seen. IMPRESSION: No acute abnormality in the abdomen. Electronically Signed   By: Inez Catalina M.D.   On: 10/18/2020 21:35        Scheduled Meds:  atorvastatin  10 mg Oral Daily   levothyroxine  50 mcg Oral Q0600   potassium chloride  20 mEq Oral BID   Continuous Infusions:  lactated ringers       LOS: 0 days    Time spent: 25 minutes     Barb Merino, MD [facility] Pager [phone]

## 2020-10-19 NOTE — Plan of Care (Signed)
  Problem: Nutrition: Goal: Adequate nutrition will be maintained Outcome: Progressing   Problem: Coping: Goal: Level of anxiety will decrease Outcome: Progressing   Problem: Pain Managment: Goal: General experience of comfort will improve Outcome: Progressing   Problem: Safety: Goal: Ability to remain free from injury will improve Outcome: Progressing   

## 2020-10-19 NOTE — Progress Notes (Signed)
Pharmacy note regarding med rec consult:  Notified med rec team of consult who is in process of completing med rec and requires phone call to patient's daughter.  Will sign off med rec consult with completion expected today from med history team.  Jossue Rubenstein P. Legrand Como, PharmD, [place] Please utilize Amion for appropriate phone number to reach the unit pharmacist ([place]anna) 10/19/2020 11:51 AM

## 2020-10-20 ENCOUNTER — Inpatient Hospital Stay (HOSPITAL_COMMUNITY): Payer: Medicare Other

## 2020-10-20 ENCOUNTER — Ambulatory Visit: Payer: Medicare Other

## 2020-10-20 DIAGNOSIS — N179 Acute kidney failure, unspecified: Secondary | ICD-10-CM | POA: Diagnosis not present

## 2020-10-20 DIAGNOSIS — N189 Chronic kidney disease, unspecified: Secondary | ICD-10-CM | POA: Diagnosis not present

## 2020-10-20 LAB — CBC WITH DIFFERENTIAL/PLATELET
Band Neutrophils: 7 %
Basophils Relative: 0 %
Blasts: NONE SEEN %
Eosinophils Relative: 0 %
HCT: 32.1 % — ABNORMAL LOW (ref 36.0–46.0)
Hemoglobin: 10.5 g/dL — ABNORMAL LOW (ref 12.0–15.0)
Lymphocytes Relative: 10 %
MCH: 29.8 pg (ref 26.0–34.0)
MCHC: 32.7 g/dL (ref 30.0–36.0)
MCV: 91.2 fL (ref 80.0–100.0)
Metamyelocytes Relative: NONE SEEN %
Monocytes Relative: 13 %
Myelocytes: NONE SEEN %
Neutrophils Relative %: 70 %
Platelets: 241 10*3/uL (ref 150–400)
Promyelocytes Relative: NONE SEEN %
RBC Morphology: NORMAL
RBC: 3.52 MIL/uL — ABNORMAL LOW (ref 3.87–5.11)
RDW: 12.9 % (ref 11.5–15.5)
WBC Morphology: NORMAL
WBC: 7.3 10*3/uL (ref 4.0–10.5)
nRBC: NONE SEEN /100 WBC

## 2020-10-20 LAB — BASIC METABOLIC PANEL
Anion gap: 9 (ref 5–15)
BUN: 9 mg/dL (ref 8–23)
CO2: 21 mmol/L — ABNORMAL LOW (ref 22–32)
Calcium: 8.8 mg/dL — ABNORMAL LOW (ref 8.9–10.3)
Chloride: 104 mmol/L (ref 98–111)
Creatinine, Ser: 0.77 mg/dL (ref 0.44–1.00)
GFR, Estimated: >60 mL/min (ref >60)
Glucose, Bld: 114 mg/dL — ABNORMAL HIGH (ref 70–99)
Potassium: 2.9 mmol/L — ABNORMAL LOW (ref 3.5–5.1)
Sodium: 134 mmol/L — ABNORMAL LOW (ref 135–145)

## 2020-10-20 LAB — MAGNESIUM: Magnesium: 1.3 mg/dL — ABNORMAL LOW (ref 1.7–2.4)

## 2020-10-20 LAB — PHOSPHORUS: Phosphorus: 1.6 mg/dL — ABNORMAL LOW (ref 2.5–4.6)

## 2020-10-20 MED ORDER — HYDRALAZINE HCL 10 MG PO TABS
10.0000 mg | ORAL_TABLET | Freq: Four times a day (QID) | ORAL | Status: DC | PRN
  Administered 2020-10-20: 10 mg via ORAL
  Filled 2020-10-20 (×4): qty 1

## 2020-10-20 MED ORDER — CLONIDINE HCL 0.1 MG PO TABS
0.1000 mg | ORAL_TABLET | Freq: Two times a day (BID) | ORAL | Status: DC
  Administered 2020-10-20 – 2020-10-21 (×3): 0.1 mg via ORAL
  Filled 2020-10-20 (×3): qty 1

## 2020-10-20 MED ORDER — DIPHENOXYLATE-ATROPINE 2.5-0.025 MG PO TABS: 1.0000 | ORAL_TABLET | Freq: Four times a day (QID) | ORAL | Status: DC | PRN

## 2020-10-20 MED ORDER — POTASSIUM CHLORIDE CRYS ER 20 MEQ PO TBCR
40.0000 meq | EXTENDED_RELEASE_TABLET | Freq: Two times a day (BID) | ORAL | Status: DC
  Administered 2020-10-20 – 2020-10-21 (×3): 40 meq via ORAL
  Filled 2020-10-20 (×3): qty 2

## 2020-10-20 MED ORDER — BUSPIRONE HCL 5 MG PO TABS
5.0000 mg | ORAL_TABLET | Freq: Three times a day (TID) | ORAL | Status: DC | PRN
  Administered 2020-10-20 (×2): 5 mg via ORAL
  Filled 2020-10-20 (×2): qty 1

## 2020-10-20 MED ORDER — AMLODIPINE BESYLATE 5 MG PO TABS
2.5000 mg | ORAL_TABLET | Freq: Every day | ORAL | Status: DC
  Administered 2020-10-20 – 2020-10-21 (×2): 2.5 mg via ORAL
  Filled 2020-10-20 (×3): qty 1

## 2020-10-20 MED ORDER — MAGNESIUM SULFATE 4 GM/100ML IV SOLN
4.0000 g | Freq: Once | INTRAVENOUS | Status: AC
  Administered 2020-10-20: 4 g via INTRAVENOUS
  Filled 2020-10-20: qty 100

## 2020-10-20 MED ORDER — ZOLPIDEM TARTRATE 5 MG PO TABS
5.0000 mg | ORAL_TABLET | Freq: Every evening | ORAL | Status: DC | PRN
  Administered 2020-10-20: 5 mg via ORAL
  Filled 2020-10-20: qty 1

## 2020-10-20 MED ORDER — POTASSIUM PHOSPHATES 15 MMOLE/5ML IV SOLN
30.0000 mmol | Freq: Once | INTRAVENOUS | Status: AC
  Administered 2020-10-20: 30 mmol via INTRAVENOUS
  Filled 2020-10-20: qty 10

## 2020-10-20 MED ORDER — METOPROLOL SUCCINATE ER 25 MG PO TB24
25.0000 mg | ORAL_TABLET | Freq: Every day | ORAL | Status: DC
  Administered 2020-10-20 – 2020-10-21 (×2): 25 mg via ORAL
  Filled 2020-10-20 (×2): qty 1

## 2020-10-20 MED ORDER — LABETALOL HCL 5 MG/ML IV SOLN: 5.0000 mg | INTRAVENOUS | Status: DC | PRN

## 2020-10-20 MED ORDER — BUPROPION HCL ER (XL) 150 MG PO TB24
150.0000 mg | ORAL_TABLET | Freq: Every morning | ORAL | Status: DC
  Administered 2020-10-20 – 2020-10-21 (×2): 150 mg via ORAL
  Filled 2020-10-20 (×2): qty 1

## 2020-10-20 NOTE — Evaluation (Signed)
Physical Therapy Evaluation Patient Details Name: [name] MRN: [id number] DOB: 11/20/1925 Today's Date: 10/20/2020   History of Present Illness  87 yo female admitted with AKI. Hx of CKD, CAD s/p stent, TAVR, breat ca. Pt is from [facility]  Clinical Impression  On eval, pt required Min A for mobility. She required encouragement to mobilize with therapy. She finally agreed to participate-she walked to/from bathroom with RW. Pt is generally weak. Daughter reports she is pretty much sedentary at her [facility] facility. Discussed d/c plan-pt plans to return to her [facility] apt. Family politely declines [facility] f/u.     Follow Up Recommendations No PT follow up;Supervision - Intermittent (family declines HH f/u)    Equipment Recommendations  None recommended by PT    Recommendations for Other Services       Precautions / Restrictions Precautions Precautions: Fall Restrictions Weight Bearing Restrictions: No      Mobility  Bed Mobility Overal bed mobility: Needs Assistance Bed Mobility: Supine to Sit;Sit to Supine     Supine to sit: HOB elevated;Min guard Sit to supine: Min assist   General bed mobility comments: Increased time. Task is effortful. Required assistance for LEs back onto bed.  (pt required encouragement to mobilize with therapy)    Transfers Overall transfer level: Needs assistance Equipment used: Rolling walker (2 wheeled) Transfers: Sit to/from Stand Sit to Stand: Min guard         General transfer comment: Min guard for safety.  Ambulation/Gait Ambulation/Gait assistance: Min guard Gait Distance (Feet): 15 Feet (x2) Assistive device: Rolling walker (2 wheeled) Gait Pattern/deviations: Step-through pattern;Decreased stride length;Trunk flexed     General Gait Details: Min guard for safety. Pt agreeable to walk to/from bathroom  Stairs            Wheelchair Mobility    Modified Rankin (Stroke Patients Only)       Balance  Overall balance assessment: Needs assistance         Standing balance support: Bilateral upper extremity supported Standing balance-Leahy Scale: Fair                               Pertinent Vitals/Pain Pain Assessment: No/denies pain    Home Living Family/patient expects to be discharged to:: Private residence Living Arrangements: Alone   Type of Home: Independent living facility Home Access: Level entry       Home Equipment: Grab bars - toilet;Grab bars - tub/shower (3 wheeled walker)      Prior Function Level of Independence: Needs assistance   Gait / Transfers Assistance Needed: walks with 3 wheeled walker  ADL's / Homemaking Assistance Needed: meals brought to her apt. family provide supervision for bathing/showering        Hand Dominance        Extremity/Trunk Assessment   Upper Extremity Assessment Upper Extremity Assessment: Generalized weakness    Lower Extremity Assessment Lower Extremity Assessment: Generalized weakness    Cervical / Trunk Assessment Cervical / Trunk Assessment: Kyphotic  Communication   Communication: HOH  Cognition Arousal/Alertness: Awake/alert Behavior During Therapy: WFL for tasks assessed/performed Overall Cognitive Status: Within Functional Limits for tasks assessed                                        General Comments      Exercises  Assessment/Plan    PT Assessment Patient needs continued PT services  PT Problem List Decreased mobility;Decreased activity tolerance;Decreased balance;Decreased knowledge of use of DME       PT Treatment Interventions Gait training;DME instruction;Therapeutic exercise;Balance training;Functional mobility training;Therapeutic activities;Patient/family education    PT Goals (Current goals can be found in the Care Plan section)  Acute Rehab PT Goals Patient Stated Goal: home soon PT Goal Formulation: With patient/family Time For Goal Achievement:  11/03/20 Potential to Achieve Goals: Good    Frequency Min 3X/week   Barriers to discharge        Co-evaluation               AM-PAC PT "6 Clicks" Mobility  Outcome Measure Help needed turning from your back to your side while in a flat bed without using bedrails?: A Little Help needed moving from lying on your back to sitting on the side of a flat bed without using bedrails?: A Little Help needed moving to and from a bed to a chair (including a wheelchair)?: A Little Help needed standing up from a chair using your arms (e.g., wheelchair or bedside chair)?: A Little Help needed to walk in hospital room?: A Little Help needed climbing 3-5 steps with a railing? : A Lot 6 Click Score: 17    End of Session   Activity Tolerance: Patient tolerated treatment well Patient left: in bed;with call bell/phone within reach;with bed alarm set;with family/visitor present   PT Visit Diagnosis: Muscle weakness (generalized) (M62.81);History of falling (Z91.81)    Time: [id number] PT Time Calculation (min) (ACUTE ONLY): 21 min   Charges:   PT Evaluation $PT Eval Moderate Complexity: [place], PT Acute Rehabilitation  Office: [phone] Pager: [phone]

## 2020-10-20 NOTE — H&P (View-Only) (Signed)
Reason for Consult: Difficulty swallowing for the past 6 months. Referring Physician: [facility] hospital[facility].  [name] is an 87 y.o. female.  HPI: [name] is a 87 year old white female with multiple medical medical problems listed below who was admitted to [facility] with diarrhea dehydration and fatigue. She claims she has had diarrhea for several months now with 3 to 4-5 loose bowel movements per day.  She denies having any melena or hematochezia.  She is was faint and dizzy when she was hospitalized but she is hemodynamically stable and feeling much better.  To work-up her dysphagia she had a barium swallow done today that showed a mildly patulous esophagus with positive esophageal peristalsis and the narrowing appearance of the GE junction with significant delay in the passage of the contrast from the esophagus to the stomach suggestive of a stricture. Patient's had some solid food dysphagia but has been able to eat some soft foods since her hospitalization. She denies having any problems swallowing liquids. She gives a history of having a normal colonoscopy with Dr. Melina Copa in [place] over 20 years ago but she has not seen them in the recent past.  She has been on Olmesartan, Diclofenac and Atorvastatin for several years.  Past Medical History:  Diagnosis Date   Aortic stenosis, severe    a. 07/2017: s/p TAVR w/ an Edwards Sapien 3 THV (size 26 mm, model # [id number], serial # [id number])   CAD (coronary artery disease)    a. 2010: s/p stent to RCA   Chronic kidney disease, stage III (moderate) (HCC)    Chronic sinusitis    Depressive disorder    History of breast cancer    a. s/p lumpectomies and XRT   Hyperlipemia    Hypertension    Hypothyroid    Insomnia    Memory loss    Osteoarthritis    Reflux esophagitis    Past Surgical History:  Procedure Laterality Date   ANGIOPLASTY     BREAST LUMPECTOMY     x2   FOOT TENDON SURGERY     GALLBLADDER SURGERY      INCISIONAL HERNIA REPAIR N/A 03/01/2019   Procedure: OPEN INCISIONAL HERNIA REPAIR;  Surgeon: Armandina Gemma, MD;  Location: [facility];  Service: General;  Laterality: N/A;   INSERTION OF MESH N/A 03/01/2019   Procedure: INSERTION OF MESH;  Surgeon: Armandina Gemma, MD;  Location: [facility];  Service: General;  Laterality: N/A;   INTRAOPERATIVE TRANSTHORACIC ECHOCARDIOGRAM N/A 08/02/2017   Procedure: INTRAOPERATIVE TRANSTHORACIC ECHOCARDIOGRAM;  Surgeon: Burnell Blanks, MD;  Location: [place];  Service: Open Heart Surgery;  Laterality: N/A;   REPLACEMENT TOTAL KNEE BILATERAL     RIGHT/LEFT HEART CATH AND CORONARY ANGIOGRAPHY N/A 07/05/2017   Procedure: RIGHT/LEFT HEART CATH AND CORONARY ANGIOGRAPHY;  Surgeon: Belva Crome, MD;  Location: [place] [facility];  Service: Cardiovascular;  Laterality: N/A;   SKIN CANCER EXCISION  2018   right nostril    TOTAL ABDOMINAL HYSTERECTOMY     TRANSCATHETER AORTIC VALVE REPLACEMENT, TRANSFEMORAL N/A 08/02/2017   Procedure: TRANSCATHETER AORTIC VALVE REPLACEMENT, TRANSFEMORAL;  Surgeon: Burnell Blanks, MD;  Location: [place];  Service: Open Heart Surgery;  Laterality: N/A;   Family History  Problem Relation Age of Onset   Heart disease Father    Stroke Mother    Heart disease Maternal Grandfather    Breast cancer Sister    Social History:  reports that she quit smoking about 56 years ago. Her  smoking use included cigarettes. She has a 10.00 pack-year smoking history. She has never used smokeless tobacco. She reports current alcohol use. She reports that she does not use drugs.  Allergies:  Allergies  Allergen Reactions   Betadine [Povidone Iodine] Other (See Comments)    Blisters    Latex Hives and Swelling   Zestril [Lisinopril] Cough   Medications: I have reviewed the patient's current medications. Prior to Admission:  Medications Prior to Admission  Medication Sig Dispense Refill Last Dose   acetaminophen (TYLENOL) 500 MG tablet Take  1,000 mg by mouth daily as needed for mild pain or headache.   Past Month   amLODipine (NORVASC) 2.5 MG tablet TAKE ONE (1) TABLET BY MOUTH ONCE DAILY (Patient taking differently: Take 2.5 mg by mouth daily.) 30 tablet 3 Past Week   amoxicillin (AMOXIL) 500 MG capsule Take 1,000 mg by mouth once as needed (dental work).   unk   Ascorbic Acid (VITAMIN C) 1000 MG tablet Take 1,000 mg by mouth daily.   Past Week   atorvastatin (LIPITOR) 10 MG tablet Take 10 mg by mouth daily.   Past Week   Azelastine-Fluticasone 137-50 MCG/ACT SUSP Place 1 spray into both nostrils 2 (two) times daily as needed (congestion).   Past Month   buPROPion (WELLBUTRIN XL) 150 MG 24 hr tablet Take 150 mg by mouth every morning.   Past Week   busPIRone (BUSPAR) 5 MG tablet Take 5 mg by mouth 3 (three) times daily as needed (anxiety).   Past Week   cholecalciferol (VITAMIN D3) 25 MCG (1000 UT) tablet Take 1,000 Units by mouth daily.   Past Week   cloNIDine (CATAPRES) 0.1 MG tablet Take 0.1 mg by mouth 2 (two) times daily.   Past Week   Eszopiclone 3 MG TABS Take 3 mg by mouth at bedtime.   Past Week   fluticasone (FLONASE) 50 MCG/ACT nasal spray Place 2 sprays into both nostrils daily as needed for allergies or rhinitis.   Past Month   fulvestrant (FASLODEX) 250 MG/5ML injection Inject into the muscle once. One injection each buttock over 1-2 minutes. Warm prior to use.   Past Month   HYDROcodone-acetaminophen (NORCO/VICODIN) 5-325 MG tablet Take 1-2 tablets by mouth every 6 (six) hours as needed for moderate pain.   Past Month   levothyroxine (SYNTHROID, LEVOTHROID) 50 MCG tablet Take 50 mcg by mouth daily before breakfast.    Past Week   losartan (COZAAR) 25 MG tablet Take 25-50 mg by mouth daily as needed (High BP). '25mg'$  daily, an additional '25mg'$  daily if needed for high blood pressure.   Past Week   metoprolol succinate (TOPROL-XL) 25 MG 24 hr tablet Take 25 mg by mouth daily.    Past Week   Misc Natural Products (NEURIVA PO)  Take 1 capsule by mouth daily.   Past Week   olmesartan (BENICAR) 40 MG tablet Take 1 tablet (40 mg total) by mouth daily. Please keep upcoming appt in August 2022 before anymore refills. Thank you Final attempt (Patient taking differently: Take 40 mg by mouth daily.) 90 tablet 0 Past Week   OVER THE COUNTER MEDICATION Take 2 tablets by mouth daily. Bladder Control 360   Past Week   polycarbophil (FIBERCON) 625 MG tablet Take 625 mg by mouth daily.   Past Week   polyethylene glycol (MIRALAX / GLYCOLAX) 17 g packet Take 17 g by mouth daily as needed for mild constipation or moderate constipation.   Past Month  Probiotic Product (ALIGN PO) Take 1 capsule by mouth daily.   Past Week   diclofenac (VOLTAREN) 75 MG EC tablet TAKE ONE (1) TABLET BY MOUTH TWO (2) TIMES DAILY (Patient taking differently: Take 75 mg by mouth 2 (two) times daily.) 180 tablet 3    Scheduled:  amLODipine  2.5 mg Oral Daily   atorvastatin  10 mg Oral Daily   buPROPion  150 mg Oral q morning   cloNIDine  0.1 mg Oral BID   levothyroxine  50 mcg Oral Q0600   metoprolol succinate  25 mg Oral Daily   potassium chloride  40 mEq Oral BID   Continuous:  potassium PHOSPHATE IVPB (in mmol) 30 mmol (10/20/20 1310)   [id number] **OR** acetaminophen, busPIRone, diphenoxylate-atropine, hydrALAZINE, zolpidem Anti-infectives (From admission, onward)    None       Results for orders placed or performed during the hospital encounter of 10/18/20 (from the past 48 hour(s))  CBC with Differential     Status: Abnormal   Collection Time: 10/18/20  4:15 PM  Result Value Ref Range   WBC 6.4 4.0 - 10.5 K/uL   RBC 3.26 (L) 3.87 - 5.11 MIL/uL   Hemoglobin 10.0 (L) 12.0 - 15.0 g/dL   HCT 30.2 (L) 36.0 - 46.0 %   MCV 92.6 80.0 - 100.0 fL   MCH 30.7 26.0 - 34.0 pg   MCHC 33.1 30.0 - 36.0 g/dL   RDW 13.2 11.5 - 15.5 %   Platelets 193 150 - 400 K/uL   nRBC 0.0 0.0 - 0.2 %   Neutrophils Relative % 67 %   Neutro Abs 4.3 1.7 - 7.7  K/uL   Lymphocytes Relative 8 %   Lymphs Abs 0.5 (L) 0.7 - 4.0 K/uL   Monocytes Relative 20 %   Monocytes Absolute 1.3 (H) 0.1 - 1.0 K/uL   Eosinophils Relative 3 %   Eosinophils Absolute 0.2 0.0 - 0.5 K/uL   Basophils Relative 1 %   Basophils Absolute 0.0 0.0 - 0.1 K/uL   Immature Granulocytes 1 %   Abs Immature Granulocytes 0.07 0.00 - 0.07 K/uL   Reactive, Benign Lymphocytes PRESENT    Dohle Bodies PRESENT    Giant PLTs PRESENT     Comment: Performed at [facility], [place] [address], [place], [place] [address]  Comprehensive metabolic panel     Status: Abnormal   Collection Time: 10/18/20  4:15 PM  Result Value Ref Range   Sodium 136 135 - 145 mmol/L   Potassium 3.5 3.5 - 5.1 mmol/L   Chloride 108 98 - 111 mmol/L   CO2 17 (L) 22 - 32 mmol/L   Glucose, Bld 89 70 - 99 mg/dL    Comment: Glucose reference range applies only to samples taken after fasting for at least 8 hours.   BUN 28 (H) 8 - 23 mg/dL   Creatinine, Ser 1.61 (H) 0.44 - 1.00 mg/dL   Calcium 8.6 (L) 8.9 - 10.3 mg/dL   Total Protein 6.0 (L) 6.5 - 8.1 g/dL   Albumin 3.1 (L) 3.5 - 5.0 g/dL   AST 13 (L) 15 - 41 U/L   ALT 13 0 - 44 U/L   Alkaline Phosphatase 44 38 - 126 U/L   Total Bilirubin 1.0 0.3 - 1.2 mg/dL   GFR, Estimated 30 (L) >60 mL/min    Comment: (NOTE) Calculated using the CKD-EPI Creatinine Equation (2021)    Anion gap 11 5 - 15    Comment: Performed at  [facility], [place] [address] [place], [place] [address]  C Difficile Quick Screen w PCR reflex     Status: None   Collection Time: 10/18/20  5:04 PM   Specimen: STOOL  Result Value Ref Range   C Diff antigen NEGATIVE NEGATIVE   C Diff toxin NEGATIVE NEGATIVE   C Diff interpretation No C. difficile detected.     Comment: Performed at [facility], [place] [address] [place], [place] [address]  Gastrointestinal Panel by PCR , Stool     Status: None   Collection Time: 10/18/20  5:04 PM   Specimen:  Stool  Result Value Ref Range   Campylobacter species NOT DETECTED NOT DETECTED   Plesimonas shigelloides NOT DETECTED NOT DETECTED   Salmonella species NOT DETECTED NOT DETECTED   Yersinia enterocolitica NOT DETECTED NOT DETECTED   Vibrio species NOT DETECTED NOT DETECTED   Vibrio cholerae NOT DETECTED NOT DETECTED   Enteroaggregative E coli (EAEC) NOT DETECTED NOT DETECTED   Enteropathogenic E coli (EPEC) NOT DETECTED NOT DETECTED   Enterotoxigenic E coli (ETEC) NOT DETECTED NOT DETECTED   Shiga like toxin producing E coli (STEC) NOT DETECTED NOT DETECTED   Shigella/Enteroinvasive E coli (EIEC) NOT DETECTED NOT DETECTED   Cryptosporidium NOT DETECTED NOT DETECTED   Cyclospora cayetanensis NOT DETECTED NOT DETECTED   Entamoeba histolytica NOT DETECTED NOT DETECTED   Giardia lamblia NOT DETECTED NOT DETECTED   Adenovirus F40/41 NOT DETECTED NOT DETECTED   Astrovirus NOT DETECTED NOT DETECTED   Norovirus GI/GII NOT DETECTED NOT DETECTED   Rotavirus A NOT DETECTED NOT DETECTED   Sapovirus (I, II, IV, and V) NOT DETECTED NOT DETECTED    Comment: Performed at [facility], [place]., [place], [place] [address]  Occult blood card to lab, stool     Status: Abnormal   Collection Time: 10/18/20  5:04 PM  Result Value Ref Range   Fecal Occult Bld POSITIVE (A) NEGATIVE    Comment: Performed at [facility], [place] [address], [place], [place] [address]  SARS CORONAVIRUS 2 (TAT 6-24 HRS) Nasopharyngeal Nasopharyngeal Swab     Status: None   Collection Time: 10/18/20  7:28 PM   Specimen: Nasopharyngeal Swab  Result Value Ref Range   SARS Coronavirus 2 NEGATIVE NEGATIVE    Comment: (NOTE) SARS-CoV-2 target nucleic acids are NOT DETECTED.  The SARS-CoV-2 RNA is generally detectable in upper and lower respiratory specimens during the acute phase of infection. Negative results do not preclude SARS-CoV-2 infection, do not rule out co-infections with other  pathogens, and should not be used as the sole basis for treatment or other patient management decisions. Negative results must be combined with clinical observations, patient history, and epidemiological information. The expected result is Negative.  Fact Sheet for Patients: SugarRoll.be  Fact Sheet for Healthcare Providers: https://www.woods-mathews.com/  This test is not yet approved or cleared by the [place] FDA and  has been authorized for detection and/or diagnosis of SARS-CoV-2 by FDA under an Emergency Use Authorization (EUA). This EUA will remain  in effect (meaning this test can be used) for the duration of the COVID-19 declaration under Se ction 564(b)(1) of the Act, 21 U.S.C. section 360bbb-3(b)(1), unless the authorization is terminated or revoked sooner.  Performed at [facility], [place] [address], [place], [place] [id number]   Basic metabolic panel     Status: Abnormal   Collection Time: 10/19/20  4:32 AM  Result Value Ref  Range   Sodium 134 (L) 135 - 145 mmol/L   Potassium 3.4 (L) 3.5 - 5.1 mmol/L   Chloride 105 98 - 111 mmol/L   CO2 21 (L) 22 - 32 mmol/L   Glucose, Bld 119 (H) 70 - 99 mg/dL    Comment: Glucose reference range applies only to samples taken after fasting for at least 8 hours.   BUN 21 8 - 23 mg/dL   Creatinine, Ser 1.07 (H) 0.44 - 1.00 mg/dL   Calcium 8.7 (L) 8.9 - 10.3 mg/dL   GFR, Estimated 48 (L) >60 mL/min    Comment: (NOTE) Calculated using the CKD-EPI Creatinine Equation (2021)    Anion gap 8 5 - 15    Comment: Performed at [facility], [place] [address], [place], [place] [address]  CBC     Status: Abnormal   Collection Time: 10/19/20  4:32 AM  Result Value Ref Range   WBC 7.7 4.0 - 10.5 K/uL   RBC 3.50 (L) 3.87 - 5.11 MIL/uL   Hemoglobin 10.6 (L) 12.0 - 15.0 g/dL   HCT 32.5 (L) 36.0 - 46.0 %   MCV 92.9 80.0 - 100.0 fL   MCH 30.3 26.0 - 34.0 pg   MCHC 32.6 30.0 -  36.0 g/dL   RDW 13.0 11.5 - 15.5 %   Platelets 216 150 - 400 K/uL   nRBC 0.0 0.0 - 0.2 %    Comment: Performed at [facility], [place] [address] [place], [place] [address]  Type and screen [place]     Status: None   Collection Time: 10/19/20  4:32 AM  Result Value Ref Range   ABO/RH(D) O POS    Antibody Screen NEG    Sample Expiration      10/22/2020,2359 Performed at [facility], [place] [address], [place], [place] [id number]   Basic metabolic panel     Status: Abnormal   Collection Time: 10/20/20  3:07 AM  Result Value Ref Range   Sodium 134 (L) 135 - 145 mmol/L   Potassium 2.9 (L) 3.5 - 5.1 mmol/L   Chloride 104 98 - 111 mmol/L   CO2 21 (L) 22 - 32 mmol/L   Glucose, Bld 114 (H) 70 - 99 mg/dL    Comment: Glucose reference range applies only to samples taken after fasting for at least 8 hours.   BUN 9 8 - 23 mg/dL   Creatinine, Ser 0.77 0.44 - 1.00 mg/dL   Calcium 8.8 (L) 8.9 - 10.3 mg/dL   GFR, Estimated >60 >60 mL/min    Comment: (NOTE) Calculated using the CKD-EPI Creatinine Equation (2021)    Anion gap 9 5 - 15    Comment: Performed at [facility], [place] [address] [place], [place] [address]  CBC with Differential/Platelet     Status: Abnormal   Collection Time: 10/20/20  3:07 AM  Result Value Ref Range   WBC 7.3 4.0 - 10.5 K/uL   RBC 3.52 (L) 3.87 - 5.11 MIL/uL   Hemoglobin 10.5 (L) 12.0 - 15.0 g/dL   HCT 32.1 (L) 36.0 - 46.0 %   MCV 91.2 80.0 - 100.0 fL   MCH 29.8 26.0 - 34.0 pg   MCHC 32.7 30.0 - 36.0 g/dL   RDW 12.9 11.5 - 15.5 %   Platelets 241 150 - 400 K/uL   Neutrophils Relative % 70 %   Band Neutrophils 7 %   Lymphocytes Relative 10 %  Monocytes Relative 13 %   Eosinophils Relative 0 %   Basophils Relative 0 %   WBC Morphology NORMAL    RBC Morphology NORMAL    Smear Review DONE    nRBC NONE SEEN 0 /100 WBC   Metamyelocytes Relative NONE SEEN %   Myelocytes NONE SEEN %    Promyelocytes Relative NONE SEEN %   Blasts NONE SEEN %    Comment: Performed at [facility], [place] [address] [place], [place] [address]  Magnesium     Status: Abnormal   Collection Time: 10/20/20  3:07 AM  Result Value Ref Range   Magnesium 1.3 (L) 1.7 - 2.4 mg/dL    Comment: Performed at [facility], [place] [address] [place], [place] [address]  Phosphorus     Status: Abnormal   Collection Time: 10/20/20  3:07 AM  Result Value Ref Range   Phosphorus 1.6 (L) 2.5 - 4.6 mg/dL    Comment: Performed at [facility], [place] [address], [place], [place] [address]    DG Abd 1 View  Result Date: 10/18/2020 CLINICAL DATA:  Diarrhea for several weeks EXAM: ABDOMEN - 1 VIEW COMPARISON:  None. FINDINGS: Scattered large and small bowel gas is noted. No obstructive changes are seen. Diffuse vascular calcifications and degenerative changes of lumbar spine are seen. No free air is noted. No abnormal mass or abnormal calcifications are seen. IMPRESSION: No acute abnormality in the abdomen. Electronically Signed   By: Inez Catalina M.D.   On: 10/18/2020 21:35   DG ESOPHAGUS W SINGLE CM (SOL OR THIN BA)  Result Date: 10/20/2020 CLINICAL DATA:  Dysphagia R13.10 (ICD-10-CM). Additional history provided: Patient reports 6 months of progressive dysphagia, feeling as though foods and liquids are becoming stuck in the region of the throat, weight loss. EXAM: ESOPHOGRAM/BARIUM SWALLOW TECHNIQUE: A single contrast examination was performed using barium contrast. FLUOROSCOPY TIME:  Fluoroscopy Time:  2 minutes, 30 seconds. Radiation Exposure Index (if provided by the fluoroscopic device): 33.3 mGy Number of Acquired Spot Images: 4 COMPARISON:  CT of the chest 07/25/2017. FINDINGS: The esophagus is mildly patulous and there is a paucity of esophageal peristalsis. Narrowed appearance of the gastroesophageal junction. Significantly delayed, and intermittent, passage of  contrast from the esophagus into the stomach. No gastroesophageal reflux identified. No appreciable hiatal hernia. Due to patient apprehension, a 13 mm barium tablet was not administered. IMPRESSION: Mildly patulous esophagus with a paucity of esophageal peristalsis. There is a narrowed appearance of the gastroesophageal junction and there is significantly delayed, and intermittent, passage of contrast from the esophagus into the stomach. Findings are suggestive of a stricture at the level of the distal esophagus/GE junction, and endoscopy is recommended for further evaluation. Electronically Signed   By: Kellie Simmering DO   On: 10/20/2020 12:26    Review of Systems  Constitutional:  Positive for fatigue. Negative for activity change, appetite change, chills, diaphoresis, fever and unexpected weight change.  HENT: Negative.    Eyes: Negative.   Respiratory: Negative.    Cardiovascular: Negative.   Gastrointestinal: Negative.   Endocrine: Negative.   Musculoskeletal:  Positive for arthralgias, back pain and gait problem.  Allergic/Immunologic: Negative.   Neurological:  Negative for dizziness.  Hematological: Negative.   Psychiatric/Behavioral:  The patient is nervous/anxious.   Blood pressure (!) 158/86, pulse (!) 106, temperature 98.7 F (37.1 C), temperature source Oral, resp. rate 17, height '4\' 11"'$  (1.499 m), weight 71.7 kg, SpO2 97 %. Physical Exam  Constitutional:      General: She is not in acute distress.    Appearance: Normal appearance. She is not ill-appearing.  HENT:     Head: Normocephalic and atraumatic.     Nose: Nose normal.     Mouth/Throat:     Mouth: Mucous membranes are moist.  Eyes:     Extraocular Movements: Extraocular movements intact.     Pupils: Pupils are equal, round, and reactive to light.  Cardiovascular:     Rate and Rhythm: Normal rate and regular rhythm.     Pulses: Normal pulses.     Heart sounds: Normal heart sounds.  Pulmonary:     Effort: Pulmonary  effort is normal.     Breath sounds: Normal breath sounds.  Musculoskeletal:     Cervical back: Normal range of motion and neck supple.  Skin:    General: Skin is warm and dry.  Neurological:     General: No focal deficit present.     Mental Status: She is alert and oriented to person, place, and time.  Psychiatric:        Mood and Affect: Mood normal.        Behavior: Behavior normal.        Thought Content: Thought content normal.        Judgment: Judgment normal.   Assessment/Plan: 1) Solid food dysphagia for the last 6 months with an abnormal barium swallow indicating decreased peristalsis and possible stricture at the [place] planned for tomorrow.  2) Chronic diarrhea causing electrolyte abnormalities, possibly induced by drugs like Olmesartan, Diclofenac and atorvastatin all of which can cause some microscopic colitis.  Strongly encourage discontinuing the olmesartan and diclofenac in view of the patient's problems with diarrhea and Stage III chronic kidney disease. 3) HTN/CAD s/p RCA stent/S/P TAVR for severe aortic stenosis.  4) Hyperlipidemia. 5) Hypothyroidism. 6) History of breast cancer. 7) History of anxiety & depression. 8) DNR. Juanita Craver 10/20/2020, 2:57 PM

## 2020-10-20 NOTE — Progress Notes (Signed)
PROGRESS NOTE    [name]  HUT:[id number] DOB: 22-Jun-1925 DOA: 10/18/2020 PCP: Philemon Kingdom, MD    Brief Narrative:  87 year old female with medical history of CAD s/p stent to RCA, TAVR, CKDstage3a, hypertension, hypothyroidism, depression/anxiety, breast cancer on fulvestrant presenting to the emergency room with dehydration, diarrhea fatigue.  Started about 3 weeks ago with frequent watery diarrhea occurring 3-4 times per day.  She had used 1 rounds of Z-Pak, for unknown reason last week.  Continue to have loose watery stool, 7 times on the day of admission.  Denies any nausea vomiting abdominal pain cramping.  Denies any blood or mucus.  Fatigue and lightheadedness dizziness.  In the emergency room hemodynamically stable.  On room air.  Creatinine 1.61 with baseline of 1.1.  C. difficile negative.  Admitted with significant dehydration and chronic diarrhea.   Assessment & Plan:   Principal Problem:   Acute kidney injury superimposed on chronic kidney disease (HCC) Active Problems:   Coronary artery disease involving native coronary artery of native heart without angina pectoris   Hyperlipidemia   Essential hypertension, benign   S/P TAVR (transcatheter aortic valve replacement)   History of breast cancer   Hypothyroidism, acquired   Dehydration  Acute kidney injury superimposed on chronic kidney disease stage IIIa: Suspect due to ongoing chronic fluid loss from diarrhea.  Improving with isotonic fluid.   Stop IV fluids and encourage oral intake.  We will see if she can keep up with oral intake.   Recheck levels tomorrow morning.    Subacute/chronic diarrhea: Primary cause unknown.  Not able to find responsible medications causing it. C. difficile negative.  GI pathogen panel negative. Symptomatic treatment.  Clinically improving.  Previously did not respond with Imodium.  Will prescribe Lomotil.  Essential hypertension: Blood pressure fairly stable.  Resume  home medications except losartan.  Coronary artery disease status post RCA stent, aortic stenosis status post TAVR: Stable.  On a statin.  Not on any antiplatelet therapy.  Hypothyroidism: On Synthroid.  Adequate.  Breast cancer on fulvestrant: Continue.  Takes injection every month and follows with Dr. Gilman Buttner.  Anxiety/depression: On multiple medications.  A lot of duplicate medications.  Pharmacy to help med rec.  Will resume once done.  Hypokalemia and hypomagnesemia and hypophosphatemia: Severe and persistent.  Replace aggressively IV and oral today.  Recheck levels tomorrow morning.  Intermittent dysphagia: We will check barium esophagogram.  Met with patient's daughter at the bedside.  Discussed about various issues.  Patient wants to go home today.  She has very low electrolytes and will need aggressive replacement. She had some response to treatment however continues to have diarrhea.  No mucus or no blood in the stool. If she continues to have symptoms, she will need colonoscopy.  Patient wants to avoid doing colonoscopy as most possible.  In that case, I recommended that we treat with Lomotil for 1 to 2 weeks and if persistent, keep a follow-up with gastroenterology and proceed with both upper GI endoscopy and colonoscopy and biopsies.  Patient agreeable.    DVT prophylaxis: SCDs Start: 10/18/20 2010   Code Status: DNR Family Communication: Daughter at the bedside. Disposition Plan: Status is: Inpatient.   Dispo: The patient is from: Home              Anticipated d/c is to: Home              Patient currently is not medically stable to d/c.  Difficult to place patient No    Consultants:  None  Procedures:  None  Antimicrobials:  None   Subjective: Patient seen and examined.  2 loose bowel movement last 24 hours.  She wants to go home.  Daughter at the bedside.  Denies any nausea vomiting. After about to complete interview, patient tells me that she has  occasional globus sensation after eating some meal.  No nausea or vomiting.  Objective: Vitals:   10/19/20 1844 10/19/20 2115 10/20/20 0426 10/20/20 0700  BP: (!) 175/89 (!) 173/84 (!) 174/94 (!) 158/86  Pulse: 96 (!) 104 (!) 104 (!) 106  Resp: $Remo'18 18 17   'EXucq$ Temp:  98.6 F (37 C) 98.7 F (37.1 C)   TempSrc:  Oral Oral   SpO2: 97% 97% 95% 97%  Weight:      Height:        Intake/Output Summary (Last 24 hours) at 10/20/2020 1130 Last data filed at 10/20/2020 0853 Gross per 24 hour  Intake 1684.65 ml  Output 0 ml  Net 1684.65 ml   Filed Weights   10/18/20 1537  Weight: 71.7 kg    Examination:  General: Looks fairly comfortable on room air. Not in any distress.  Sitting in couch. Cardiovascular: S1-S2 normal.  Regular rate rhythm. Respiratory: Bilateral clear.  No added sounds. Gastrointestinal: Soft and nontender.  Bowel sounds present. Ext: No cyanosis or edema.     Data Reviewed: I have personally reviewed following labs and imaging studies  CBC: Recent Labs  Lab 10/18/20 1615 10/19/20 0432 10/20/20 0307  WBC 6.4 7.7 7.3  NEUTROABS 4.3  --   --   HGB 10.0* 10.6* 10.5*  HCT 30.2* 32.5* 32.1*  MCV 92.6 92.9 91.2  PLT 193 216 161   Basic Metabolic Panel: Recent Labs  Lab 10/18/20 1615 10/19/20 0432 10/20/20 0307  NA 136 134* 134*  K 3.5 3.4* 2.9*  CL 108 105 104  CO2 17* 21* 21*  GLUCOSE 89 119* 114*  BUN 28* 21 9  CREATININE 1.61* 1.07* 0.77  CALCIUM 8.6* 8.7* 8.8*  MG  --   --  1.3*  PHOS  --   --  1.6*   GFR: Estimated Creatinine Clearance: 37.9 mL/min (by C-G formula based on SCr of 0.77 mg/dL). Liver Function Tests: Recent Labs  Lab 10/18/20 1615  AST 13*  ALT 13  ALKPHOS 44  BILITOT 1.0  PROT 6.0*  ALBUMIN 3.1*   No results for input(s): LIPASE, AMYLASE in the last 168 hours. No results for input(s): AMMONIA in the last 168 hours. Coagulation Profile: No results for input(s): INR, PROTIME in the last 168 hours. Cardiac Enzymes: No  results for input(s): CKTOTAL, CKMB, CKMBINDEX, TROPONINI in the last 168 hours. BNP (last 3 results) No results for input(s): PROBNP in the last 8760 hours. HbA1C: No results for input(s): HGBA1C in the last 72 hours. CBG: No results for input(s): GLUCAP in the last 168 hours. Lipid Profile: No results for input(s): CHOL, HDL, LDLCALC, TRIG, CHOLHDL, LDLDIRECT in the last 72 hours. Thyroid Function Tests: No results for input(s): TSH, T4TOTAL, FREET4, T3FREE, THYROIDAB in the last 72 hours. Anemia Panel: No results for input(s): VITAMINB12, FOLATE, FERRITIN, TIBC, IRON, RETICCTPCT in the last 72 hours. Sepsis Labs: No results for input(s): PROCALCITON, LATICACIDVEN in the last 168 hours.  Recent Results (from the past 240 hour(s))  C Difficile Quick Screen w PCR reflex     Status: None   Collection Time: 10/18/20  5:04 PM  Specimen: STOOL  Result Value Ref Range Status   C Diff antigen NEGATIVE NEGATIVE Final   C Diff toxin NEGATIVE NEGATIVE Final   C Diff interpretation No C. difficile detected.  Final    Comment: Performed at [facility], [place] [address] [place], [place] [address]  Gastrointestinal Panel by PCR , Stool     Status: None   Collection Time: 10/18/20  5:04 PM   Specimen: Stool  Result Value Ref Range Status   Campylobacter species NOT DETECTED NOT DETECTED Final   Plesimonas shigelloides NOT DETECTED NOT DETECTED Final   Salmonella species NOT DETECTED NOT DETECTED Final   Yersinia enterocolitica NOT DETECTED NOT DETECTED Final   Vibrio species NOT DETECTED NOT DETECTED Final   Vibrio cholerae NOT DETECTED NOT DETECTED Final   Enteroaggregative E coli (EAEC) NOT DETECTED NOT DETECTED Final   Enteropathogenic E coli (EPEC) NOT DETECTED NOT DETECTED Final   Enterotoxigenic E coli (ETEC) NOT DETECTED NOT DETECTED Final   Shiga like toxin producing E coli (STEC) NOT DETECTED NOT DETECTED Final   Shigella/Enteroinvasive E coli (EIEC) NOT DETECTED  NOT DETECTED Final   Cryptosporidium NOT DETECTED NOT DETECTED Final   Cyclospora cayetanensis NOT DETECTED NOT DETECTED Final   Entamoeba histolytica NOT DETECTED NOT DETECTED Final   Giardia lamblia NOT DETECTED NOT DETECTED Final   Adenovirus F40/41 NOT DETECTED NOT DETECTED Final   Astrovirus NOT DETECTED NOT DETECTED Final   Norovirus GI/GII NOT DETECTED NOT DETECTED Final   Rotavirus A NOT DETECTED NOT DETECTED Final   Sapovirus (I, II, IV, and V) NOT DETECTED NOT DETECTED Final    Comment: Performed at [facility], [address] [place], [place] [address]  SARS CORONAVIRUS 2 (TAT 6-24 HRS) Nasopharyngeal Nasopharyngeal Swab     Status: None   Collection Time: 10/18/20  7:28 PM   Specimen: Nasopharyngeal Swab  Result Value Ref Range Status   SARS Coronavirus 2 NEGATIVE NEGATIVE Final    Comment: (NOTE) SARS-CoV-2 target nucleic acids are NOT DETECTED.  The SARS-CoV-2 RNA is generally detectable in upper and lower respiratory specimens during the acute phase of infection. Negative results do not preclude SARS-CoV-2 infection, do not rule out co-infections with other pathogens, and should not be used as the sole basis for treatment or other patient management decisions. Negative results must be combined with clinical observations, patient history, and epidemiological information. The expected result is Negative.  Fact Sheet for Patients: SugarRoll.be  Fact Sheet for Healthcare Providers: https://www.woods-mathews.com/  This test is not yet approved or cleared by the [place] FDA and  has been authorized for detection and/or diagnosis of SARS-CoV-2 by FDA under an Emergency Use Authorization (EUA). This EUA will remain  in effect (meaning this test can be used) for the duration of the COVID-19 declaration under Se ction 564(b)(1) of the Act, 21 U.S.C. section 360bbb-3(b)(1), unless the authorization is terminated  or revoked sooner.  Performed at [facility], [place] [address], [place],  [address]          Radiology Studies: DG Abd 1 View  Result Date: 10/18/2020 CLINICAL DATA:  Diarrhea for several weeks EXAM: ABDOMEN - 1 VIEW COMPARISON:  None. FINDINGS: Scattered large and small bowel gas is noted. No obstructive changes are seen. Diffuse vascular calcifications and degenerative changes of lumbar spine are seen. No free air is noted. No abnormal mass or abnormal calcifications are seen. IMPRESSION: No acute abnormality in the abdomen. Electronically Signed  By: Inez Catalina M.D.   On: 10/18/2020 21:35        Scheduled Meds:  amLODipine  2.5 mg Oral Daily   atorvastatin  10 mg Oral Daily   buPROPion  150 mg Oral q morning   cloNIDine  0.1 mg Oral BID   levothyroxine  50 mcg Oral Q0600   metoprolol succinate  25 mg Oral Daily   potassium chloride  40 mEq Oral BID   Continuous Infusions:  magnesium sulfate bolus IVPB 4 g (10/20/20 1039)   potassium PHOSPHATE IVPB (in mmol)       LOS: 1 day    Time spent: 25 minutes     Barb Merino, MD [facility] Pager [phone]

## 2020-10-20 NOTE — Progress Notes (Signed)
Called for piv start.  Pt. Refuses, states, :I don;t want it. "  Staff RN called into room, pt repeated.  IV held at this time.

## 2020-10-20 NOTE — Consult Note (Signed)
Reason for Consult: Difficulty swallowing for the past 6 months. Referring Physician: [facility] hospital[facility].  [name] is an 87 y.o. female.  HPI: [name] is a 87 year old white female with multiple medical medical problems listed below who was admitted to [facility] with diarrhea dehydration and fatigue. She claims she has had diarrhea for several months now with 3 to 4-5 loose bowel movements per day.  She denies having any melena or hematochezia.  She is was faint and dizzy when she was hospitalized but she is hemodynamically stable and feeling much better.  To work-up her dysphagia she had a barium swallow done today that showed a mildly patulous esophagus with positive esophageal peristalsis and the narrowing appearance of the GE junction with significant delay in the passage of the contrast from the esophagus to the stomach suggestive of a stricture. Patient's had some solid food dysphagia but has been able to eat some soft foods since her hospitalization. She denies having any problems swallowing liquids. She gives a history of having a normal colonoscopy with Dr. Melina Copa in [employer] over 20 years ago but she has not seen them in the recent past.  She has been on Olmesartan, Diclofenac and Atorvastatin for several years.  Past Medical History:  Diagnosis Date   Aortic stenosis, severe    a. 07/2017: s/p TAVR w/ an Edwards Sapien 3 THV (size 26 mm, model # [id number], serial # [id number])   CAD (coronary artery disease)    a. 2010: s/p stent to RCA   Chronic kidney disease, stage III (moderate) (HCC)    Chronic sinusitis    Depressive disorder    History of breast cancer    a. s/p lumpectomies and XRT   Hyperlipemia    Hypertension    Hypothyroid    Insomnia    Memory loss    Osteoarthritis    Reflux esophagitis    Past Surgical History:  Procedure Laterality Date   ANGIOPLASTY     BREAST LUMPECTOMY     x2   FOOT TENDON SURGERY     GALLBLADDER SURGERY      INCISIONAL HERNIA REPAIR N/A 03/01/2019   Procedure: OPEN INCISIONAL HERNIA REPAIR;  Surgeon: Armandina Gemma, MD;  Location: [facility];  Service: General;  Laterality: N/A;   INSERTION OF MESH N/A 03/01/2019   Procedure: INSERTION OF MESH;  Surgeon: Armandina Gemma, MD;  Location: [facility];  Service: General;  Laterality: N/A;   INTRAOPERATIVE TRANSTHORACIC ECHOCARDIOGRAM N/A 08/02/2017   Procedure: INTRAOPERATIVE TRANSTHORACIC ECHOCARDIOGRAM;  Surgeon: Burnell Blanks, MD;  Location: [place];  Service: Open Heart Surgery;  Laterality: N/A;   REPLACEMENT TOTAL KNEE BILATERAL     RIGHT/LEFT HEART CATH AND CORONARY ANGIOGRAPHY N/A 07/05/2017   Procedure: RIGHT/LEFT HEART CATH AND CORONARY ANGIOGRAPHY;  Surgeon: Belva Crome, MD;  Location: [place] [facility];  Service: Cardiovascular;  Laterality: N/A;   SKIN CANCER EXCISION  2018   right nostril    TOTAL ABDOMINAL HYSTERECTOMY     TRANSCATHETER AORTIC VALVE REPLACEMENT, TRANSFEMORAL N/A 08/02/2017   Procedure: TRANSCATHETER AORTIC VALVE REPLACEMENT, TRANSFEMORAL;  Surgeon: Burnell Blanks, MD;  Location: [place];  Service: Open Heart Surgery;  Laterality: N/A;   Family History  Problem Relation Age of Onset   Heart disease Father    Stroke Mother    Heart disease Maternal Grandfather    Breast cancer Sister    Social History:  reports that she quit smoking about 56 years ago. Her  smoking use included cigarettes. She has a 10.00 pack-year smoking history. She has never used smokeless tobacco. She reports current alcohol use. She reports that she does not use drugs.  Allergies:  Allergies  Allergen Reactions   Betadine [Povidone Iodine] Other (See Comments)    Blisters    Latex Hives and Swelling   Zestril [Lisinopril] Cough   Medications: I have reviewed the patient's current medications. Prior to Admission:  Medications Prior to Admission  Medication Sig Dispense Refill Last Dose   acetaminophen (TYLENOL) 500 MG tablet Take  1,000 mg by mouth daily as needed for mild pain or headache.   Past Month   amLODipine (NORVASC) 2.5 MG tablet TAKE ONE (1) TABLET BY MOUTH ONCE DAILY (Patient taking differently: Take 2.5 mg by mouth daily.) 30 tablet 3 Past Week   amoxicillin (AMOXIL) 500 MG capsule Take 1,000 mg by mouth once as needed (dental work).   unk   Ascorbic Acid (VITAMIN C) 1000 MG tablet Take 1,000 mg by mouth daily.   Past Week   atorvastatin (LIPITOR) 10 MG tablet Take 10 mg by mouth daily.   Past Week   Azelastine-Fluticasone 137-50 MCG/ACT SUSP Place 1 spray into both nostrils 2 (two) times daily as needed (congestion).   Past Month   buPROPion (WELLBUTRIN XL) 150 MG 24 hr tablet Take 150 mg by mouth every morning.   Past Week   busPIRone (BUSPAR) 5 MG tablet Take 5 mg by mouth 3 (three) times daily as needed (anxiety).   Past Week   cholecalciferol (VITAMIN D3) 25 MCG (1000 UT) tablet Take 1,000 Units by mouth daily.   Past Week   cloNIDine (CATAPRES) 0.1 MG tablet Take 0.1 mg by mouth 2 (two) times daily.   Past Week   Eszopiclone 3 MG TABS Take 3 mg by mouth at bedtime.   Past Week   fluticasone (FLONASE) 50 MCG/ACT nasal spray Place 2 sprays into both nostrils daily as needed for allergies or rhinitis.   Past Month   fulvestrant (FASLODEX) 250 MG/5ML injection Inject into the muscle once. One injection each buttock over 1-2 minutes. Warm prior to use.   Past Month   HYDROcodone-acetaminophen (NORCO/VICODIN) 5-325 MG tablet Take 1-2 tablets by mouth every 6 (six) hours as needed for moderate pain.   Past Month   levothyroxine (SYNTHROID, LEVOTHROID) 50 MCG tablet Take 50 mcg by mouth daily before breakfast.    Past Week   losartan (COZAAR) 25 MG tablet Take 25-50 mg by mouth daily as needed (High BP). '25mg'$  daily, an additional '25mg'$  daily if needed for high blood pressure.   Past Week   metoprolol succinate (TOPROL-XL) 25 MG 24 hr tablet Take 25 mg by mouth daily.    Past Week   Misc Natural Products (NEURIVA PO)  Take 1 capsule by mouth daily.   Past Week   olmesartan (BENICAR) 40 MG tablet Take 1 tablet (40 mg total) by mouth daily. Please keep upcoming appt in August 2022 before anymore refills. Thank you Final attempt (Patient taking differently: Take 40 mg by mouth daily.) 90 tablet 0 Past Week   OVER THE COUNTER MEDICATION Take 2 tablets by mouth daily. Bladder Control 360   Past Week   polycarbophil (FIBERCON) 625 MG tablet Take 625 mg by mouth daily.   Past Week   polyethylene glycol (MIRALAX / GLYCOLAX) 17 g packet Take 17 g by mouth daily as needed for mild constipation or moderate constipation.   Past Month  Probiotic Product (ALIGN PO) Take 1 capsule by mouth daily.   Past Week   diclofenac (VOLTAREN) 75 MG EC tablet TAKE ONE (1) TABLET BY MOUTH TWO (2) TIMES DAILY (Patient taking differently: Take 75 mg by mouth 2 (two) times daily.) 180 tablet 3    Scheduled:  amLODipine  2.5 mg Oral Daily   atorvastatin  10 mg Oral Daily   buPROPion  150 mg Oral q morning   cloNIDine  0.1 mg Oral BID   levothyroxine  50 mcg Oral Q0600   metoprolol succinate  25 mg Oral Daily   potassium chloride  40 mEq Oral BID   Continuous:  potassium PHOSPHATE IVPB (in mmol) 30 mmol (10/20/20 1310)   [id number] **OR** acetaminophen, busPIRone, diphenoxylate-atropine, hydrALAZINE, zolpidem Anti-infectives (From admission, onward)    None       Results for orders placed or performed during the hospital encounter of 10/18/20 (from the past 48 hour(s))  CBC with Differential     Status: Abnormal   Collection Time: 10/18/20  4:15 PM  Result Value Ref Range   WBC 6.4 4.0 - 10.5 K/uL   RBC 3.26 (L) 3.87 - 5.11 MIL/uL   Hemoglobin 10.0 (L) 12.0 - 15.0 g/dL   HCT 30.2 (L) 36.0 - 46.0 %   MCV 92.6 80.0 - 100.0 fL   MCH 30.7 26.0 - 34.0 pg   MCHC 33.1 30.0 - 36.0 g/dL   RDW 13.2 11.5 - 15.5 %   Platelets 193 150 - 400 K/uL   nRBC 0.0 0.0 - 0.2 %   Neutrophils Relative % 67 %   Neutro Abs 4.3 1.7 - 7.7  K/uL   Lymphocytes Relative 8 %   Lymphs Abs 0.5 (L) 0.7 - 4.0 K/uL   Monocytes Relative 20 %   Monocytes Absolute 1.3 (H) 0.1 - 1.0 K/uL   Eosinophils Relative 3 %   Eosinophils Absolute 0.2 0.0 - 0.5 K/uL   Basophils Relative 1 %   Basophils Absolute 0.0 0.0 - 0.1 K/uL   Immature Granulocytes 1 %   Abs Immature Granulocytes 0.07 0.00 - 0.07 K/uL   Reactive, Benign Lymphocytes PRESENT    Dohle Bodies PRESENT    Giant PLTs PRESENT     Comment: Performed at [facility], [place] [address] [place], [place] [address]  Comprehensive metabolic panel     Status: Abnormal   Collection Time: 10/18/20  4:15 PM  Result Value Ref Range   Sodium 136 135 - 145 mmol/L   Potassium 3.5 3.5 - 5.1 mmol/L   Chloride 108 98 - 111 mmol/L   CO2 17 (L) 22 - 32 mmol/L   Glucose, Bld 89 70 - 99 mg/dL    Comment: Glucose reference range applies only to samples taken after fasting for at least 8 hours.   BUN 28 (H) 8 - 23 mg/dL   Creatinine, Ser 1.61 (H) 0.44 - 1.00 mg/dL   Calcium 8.6 (L) 8.9 - 10.3 mg/dL   Total Protein 6.0 (L) 6.5 - 8.1 g/dL   Albumin 3.1 (L) 3.5 - 5.0 g/dL   AST 13 (L) 15 - 41 U/L   ALT 13 0 - 44 U/L   Alkaline Phosphatase 44 38 - 126 U/L   Total Bilirubin 1.0 0.3 - 1.2 mg/dL   GFR, Estimated 30 (L) >60 mL/min    Comment: (NOTE) Calculated using the CKD-EPI Creatinine Equation (2021)    Anion gap 11 5 - 15    Comment: Performed at  [facility], [place] [address], [place], [place] [address]  C Difficile Quick Screen w PCR reflex     Status: None   Collection Time: 10/18/20  5:04 PM   Specimen: STOOL  Result Value Ref Range   C Diff antigen NEGATIVE NEGATIVE   C Diff toxin NEGATIVE NEGATIVE   C Diff interpretation No C. difficile detected.     Comment: Performed at [facility], [place] [address] [place], [place] [address]  Gastrointestinal Panel by PCR , Stool     Status: None   Collection Time: 10/18/20  5:04 PM   Specimen:  Stool  Result Value Ref Range   Campylobacter species NOT DETECTED NOT DETECTED   Plesimonas shigelloides NOT DETECTED NOT DETECTED   Salmonella species NOT DETECTED NOT DETECTED   Yersinia enterocolitica NOT DETECTED NOT DETECTED   Vibrio species NOT DETECTED NOT DETECTED   Vibrio cholerae NOT DETECTED NOT DETECTED   Enteroaggregative E coli (EAEC) NOT DETECTED NOT DETECTED   Enteropathogenic E coli (EPEC) NOT DETECTED NOT DETECTED   Enterotoxigenic E coli (ETEC) NOT DETECTED NOT DETECTED   Shiga like toxin producing E coli (STEC) NOT DETECTED NOT DETECTED   Shigella/Enteroinvasive E coli (EIEC) NOT DETECTED NOT DETECTED   Cryptosporidium NOT DETECTED NOT DETECTED   Cyclospora cayetanensis NOT DETECTED NOT DETECTED   Entamoeba histolytica NOT DETECTED NOT DETECTED   Giardia lamblia NOT DETECTED NOT DETECTED   Adenovirus F40/41 NOT DETECTED NOT DETECTED   Astrovirus NOT DETECTED NOT DETECTED   Norovirus GI/GII NOT DETECTED NOT DETECTED   Rotavirus A NOT DETECTED NOT DETECTED   Sapovirus (I, II, IV, and V) NOT DETECTED NOT DETECTED    Comment: Performed at [facility], [place] [place], [place] [address]  Occult blood card to lab, stool     Status: Abnormal   Collection Time: 10/18/20  5:04 PM  Result Value Ref Range   Fecal Occult Bld POSITIVE (A) NEGATIVE    Comment: Performed at [facility], [place] [address] [place], [place] [address]  SARS CORONAVIRUS 2 (TAT 6-24 HRS) Nasopharyngeal Nasopharyngeal Swab     Status: None   Collection Time: 10/18/20  7:28 PM   Specimen: Nasopharyngeal Swab  Result Value Ref Range   SARS Coronavirus 2 NEGATIVE NEGATIVE    Comment: (NOTE) SARS-CoV-2 target nucleic acids are NOT DETECTED.  The SARS-CoV-2 RNA is generally detectable in upper and lower respiratory specimens during the acute phase of infection. Negative results do not preclude SARS-CoV-2 infection, do not rule out co-infections with other  pathogens, and should not be used as the sole basis for treatment or other patient management decisions. Negative results must be combined with clinical observations, patient history, and epidemiological information. The expected result is Negative.  Fact Sheet for Patients: SugarRoll.be  Fact Sheet for Healthcare Providers: https://www.woods-mathews.com/  This test is not yet approved or cleared by the [place] FDA and  has been authorized for detection and/or diagnosis of SARS-CoV-2 by FDA under an Emergency Use Authorization (EUA). This EUA will remain  in effect (meaning this test can be used) for the duration of the COVID-19 declaration under Se ction 564(b)(1) of the Act, 21 U.S.C. section 360bbb-3(b)(1), unless the authorization is terminated or revoked sooner.  Performed at [facility], [place] [address], [place], [place] [id number]   Basic metabolic panel     Status: Abnormal   Collection Time: 10/19/20  4:32 AM  Result Value Ref  Range   Sodium 134 (L) 135 - 145 mmol/L   Potassium 3.4 (L) 3.5 - 5.1 mmol/L   Chloride 105 98 - 111 mmol/L   CO2 21 (L) 22 - 32 mmol/L   Glucose, Bld 119 (H) 70 - 99 mg/dL    Comment: Glucose reference range applies only to samples taken after fasting for at least 8 hours.   BUN 21 8 - 23 mg/dL   Creatinine, Ser 1.07 (H) 0.44 - 1.00 mg/dL   Calcium 8.7 (L) 8.9 - 10.3 mg/dL   GFR, Estimated 48 (L) >60 mL/min    Comment: (NOTE) Calculated using the CKD-EPI Creatinine Equation (2021)    Anion gap 8 5 - 15    Comment: Performed at [facility], [place] [address], [place], [place] [address]  CBC     Status: Abnormal   Collection Time: 10/19/20  4:32 AM  Result Value Ref Range   WBC 7.7 4.0 - 10.5 K/uL   RBC 3.50 (L) 3.87 - 5.11 MIL/uL   Hemoglobin 10.6 (L) 12.0 - 15.0 g/dL   HCT 32.5 (L) 36.0 - 46.0 %   MCV 92.9 80.0 - 100.0 fL   MCH 30.3 26.0 - 34.0 pg   MCHC 32.6 30.0 -  36.0 g/dL   RDW 13.0 11.5 - 15.5 %   Platelets 216 150 - 400 K/uL   nRBC 0.0 0.0 - 0.2 %    Comment: Performed at [facility], [place] [address] [place], [place] [address]  Type and screen [place]     Status: None   Collection Time: 10/19/20  4:32 AM  Result Value Ref Range   ABO/RH(D) O POS    Antibody Screen NEG    Sample Expiration      10/22/2020,2359 Performed at [facility], [place] [address], [place], [place] [id number]   Basic metabolic panel     Status: Abnormal   Collection Time: 10/20/20  3:07 AM  Result Value Ref Range   Sodium 134 (L) 135 - 145 mmol/L   Potassium 2.9 (L) 3.5 - 5.1 mmol/L   Chloride 104 98 - 111 mmol/L   CO2 21 (L) 22 - 32 mmol/L   Glucose, Bld 114 (H) 70 - 99 mg/dL    Comment: Glucose reference range applies only to samples taken after fasting for at least 8 hours.   BUN 9 8 - 23 mg/dL   Creatinine, Ser 0.77 0.44 - 1.00 mg/dL   Calcium 8.8 (L) 8.9 - 10.3 mg/dL   GFR, Estimated >60 >60 mL/min    Comment: (NOTE) Calculated using the CKD-EPI Creatinine Equation (2021)    Anion gap 9 5 - 15    Comment: Performed at [facility], [place] [address] [place], [place] [address]  CBC with Differential/Platelet     Status: Abnormal   Collection Time: 10/20/20  3:07 AM  Result Value Ref Range   WBC 7.3 4.0 - 10.5 K/uL   RBC 3.52 (L) 3.87 - 5.11 MIL/uL   Hemoglobin 10.5 (L) 12.0 - 15.0 g/dL   HCT 32.1 (L) 36.0 - 46.0 %   MCV 91.2 80.0 - 100.0 fL   MCH 29.8 26.0 - 34.0 pg   MCHC 32.7 30.0 - 36.0 g/dL   RDW 12.9 11.5 - 15.5 %   Platelets 241 150 - 400 K/uL   Neutrophils Relative % 70 %   Band Neutrophils 7 %   Lymphocytes Relative 10 %  Monocytes Relative 13 %   Eosinophils Relative 0 %   Basophils Relative 0 %   WBC Morphology NORMAL    RBC Morphology NORMAL    Smear Review DONE    nRBC NONE SEEN 0 /100 WBC   Metamyelocytes Relative NONE SEEN %   Myelocytes NONE SEEN %    Promyelocytes Relative NONE SEEN %   Blasts NONE SEEN %    Comment: Performed at [facility], [place] [address] [place], [place] [address]  Magnesium     Status: Abnormal   Collection Time: 10/20/20  3:07 AM  Result Value Ref Range   Magnesium 1.3 (L) 1.7 - 2.4 mg/dL    Comment: Performed at [facility], [place] [address], [place], [place] [address]  Phosphorus     Status: Abnormal   Collection Time: 10/20/20  3:07 AM  Result Value Ref Range   Phosphorus 1.6 (L) 2.5 - 4.6 mg/dL    Comment: Performed at [facility], [place] [address], [place], [place] [address]    DG Abd 1 View  Result Date: 10/18/2020 CLINICAL DATA:  Diarrhea for several weeks EXAM: ABDOMEN - 1 VIEW COMPARISON:  None. FINDINGS: Scattered large and small bowel gas is noted. No obstructive changes are seen. Diffuse vascular calcifications and degenerative changes of lumbar spine are seen. No free air is noted. No abnormal mass or abnormal calcifications are seen. IMPRESSION: No acute abnormality in the abdomen. Electronically Signed   By: Inez Catalina M.D.   On: 10/18/2020 21:35   DG ESOPHAGUS W SINGLE CM (SOL OR THIN BA)  Result Date: 10/20/2020 CLINICAL DATA:  Dysphagia R13.10 (ICD-10-CM). Additional history provided: Patient reports 6 months of progressive dysphagia, feeling as though foods and liquids are becoming stuck in the region of the throat, weight loss. EXAM: ESOPHOGRAM/BARIUM SWALLOW TECHNIQUE: A single contrast examination was performed using barium contrast. FLUOROSCOPY TIME:  Fluoroscopy Time:  2 minutes, 30 seconds. Radiation Exposure Index (if provided by the fluoroscopic device): 33.3 mGy Number of Acquired Spot Images: 4 COMPARISON:  CT of the chest 07/25/2017. FINDINGS: The esophagus is mildly patulous and there is a paucity of esophageal peristalsis. Narrowed appearance of the gastroesophageal junction. Significantly delayed, and intermittent, passage of  contrast from the esophagus into the stomach. No gastroesophageal reflux identified. No appreciable hiatal hernia. Due to patient apprehension, a 13 mm barium tablet was not administered. IMPRESSION: Mildly patulous esophagus with a paucity of esophageal peristalsis. There is a narrowed appearance of the gastroesophageal junction and there is significantly delayed, and intermittent, passage of contrast from the esophagus into the stomach. Findings are suggestive of a stricture at the level of the distal esophagus/GE junction, and endoscopy is recommended for further evaluation. Electronically Signed   By: Kellie Simmering DO   On: 10/20/2020 12:26    Review of Systems  Constitutional:  Positive for fatigue. Negative for activity change, appetite change, chills, diaphoresis, fever and unexpected weight change.  HENT: Negative.    Eyes: Negative.   Respiratory: Negative.    Cardiovascular: Negative.   Gastrointestinal: Negative.   Endocrine: Negative.   Musculoskeletal:  Positive for arthralgias, back pain and gait problem.  Allergic/Immunologic: Negative.   Neurological:  Negative for dizziness.  Hematological: Negative.   Psychiatric/Behavioral:  The patient is nervous/anxious.   Blood pressure (!) 158/86, pulse (!) 106, temperature 98.7 F (37.1 C), temperature source Oral, resp. rate 17, height '4\' 11"'$  (1.499 m), weight 71.7 kg, SpO2 97 %. Physical Exam  Constitutional:      General: She is not in acute distress.    Appearance: Normal appearance. She is not ill-appearing.  HENT:     Head: Normocephalic and atraumatic.     Nose: Nose normal.     Mouth/Throat:     Mouth: Mucous membranes are moist.  Eyes:     Extraocular Movements: Extraocular movements intact.     Pupils: Pupils are equal, round, and reactive to light.  Cardiovascular:     Rate and Rhythm: Normal rate and regular rhythm.     Pulses: Normal pulses.     Heart sounds: Normal heart sounds.  Pulmonary:     Effort: Pulmonary  effort is normal.     Breath sounds: Normal breath sounds.  Musculoskeletal:     Cervical back: Normal range of motion and neck supple.  Skin:    General: Skin is warm and dry.  Neurological:     General: No focal deficit present.     Mental Status: She is alert and oriented to person, place, and time.  Psychiatric:        Mood and Affect: Mood normal.        Behavior: Behavior normal.        Thought Content: Thought content normal.        Judgment: Judgment normal.   Assessment/Plan: 1) Solid food dysphagia for the last 6 months with an abnormal barium swallow indicating decreased peristalsis and possible stricture at the [place] planned for tomorrow.  2) Chronic diarrhea causing electrolyte abnormalities, possibly induced by drugs like Olmesartan, Diclofenac and atorvastatin all of which can cause some microscopic colitis.  Strongly encourage discontinuing the olmesartan and diclofenac in view of the patient's problems with diarrhea and Stage III chronic kidney disease. 3) HTN/CAD s/p RCA stent/S/P TAVR for severe aortic stenosis.  4) Hyperlipidemia. 5) Hypothyroidism. 6) History of breast cancer. 7) History of anxiety & depression. 8) DNR. Juanita Craver 10/20/2020, 2:57 PM

## 2020-10-21 ENCOUNTER — Encounter (HOSPITAL_COMMUNITY): Payer: Self-pay | Admitting: Internal Medicine

## 2020-10-21 ENCOUNTER — Inpatient Hospital Stay (HOSPITAL_COMMUNITY): Payer: Medicare Other | Admitting: Anesthesiology

## 2020-10-21 ENCOUNTER — Encounter (HOSPITAL_COMMUNITY)
Admission: EM | Disposition: A | Discharge status: Home / Self Care | Payer: Self-pay | Source: Home / Self Care | Attending: Internal Medicine

## 2020-10-21 DIAGNOSIS — N189 Chronic kidney disease, unspecified: Secondary | ICD-10-CM | POA: Diagnosis not present

## 2020-10-21 DIAGNOSIS — N179 Acute kidney failure, unspecified: Secondary | ICD-10-CM | POA: Diagnosis not present

## 2020-10-21 HISTORY — PX: ESOPHAGOGASTRODUODENOSCOPY: SHX5428

## 2020-10-21 HISTORY — PX: BIOPSY: SHX5522

## 2020-10-21 LAB — CBC WITH DIFFERENTIAL/PLATELET
Abs Immature Granulocytes: 0.21 10*3/uL — ABNORMAL HIGH (ref 0.00–0.07)
Basophils Absolute: 0 10*3/uL (ref 0.0–0.1)
Basophils Relative: 0 %
Eosinophils Absolute: 0.1 10*3/uL (ref 0.0–0.5)
Eosinophils Relative: 1 %
HCT: 32.1 % — ABNORMAL LOW (ref 36.0–46.0)
Hemoglobin: 10.7 g/dL — ABNORMAL LOW (ref 12.0–15.0)
Immature Granulocytes: 2 %
Lymphocytes Relative: 8 %
Lymphs Abs: 0.8 10*3/uL (ref 0.7–4.0)
MCH: 30.1 pg (ref 26.0–34.0)
MCHC: 33.3 g/dL (ref 30.0–36.0)
MCV: 90.2 fL (ref 80.0–100.0)
Monocytes Absolute: 1.4 10*3/uL — ABNORMAL HIGH (ref 0.1–1.0)
Monocytes Relative: 15 %
Neutro Abs: 7.3 10*3/uL (ref 1.7–7.7)
Neutrophils Relative %: 74 %
Platelets: 263 10*3/uL (ref 150–400)
RBC: 3.56 MIL/uL — ABNORMAL LOW (ref 3.87–5.11)
RDW: 12.8 % (ref 11.5–15.5)
WBC: 9.8 10*3/uL (ref 4.0–10.5)
nRBC: 0 % (ref 0.0–0.2)

## 2020-10-21 LAB — BASIC METABOLIC PANEL
Anion gap: 7 (ref 5–15)
BUN: 7 mg/dL — ABNORMAL LOW (ref 8–23)
CO2: 23 mmol/L (ref 22–32)
Calcium: 8.9 mg/dL (ref 8.9–10.3)
Chloride: 105 mmol/L (ref 98–111)
Creatinine, Ser: 0.73 mg/dL (ref 0.44–1.00)
GFR, Estimated: >60 mL/min (ref >60)
Glucose, Bld: 117 mg/dL — ABNORMAL HIGH (ref 70–99)
Potassium: 3.7 mmol/L (ref 3.5–5.1)
Sodium: 135 mmol/L (ref 135–145)

## 2020-10-21 LAB — PHOSPHORUS: Phosphorus: 2.3 mg/dL — ABNORMAL LOW (ref 2.5–4.6)

## 2020-10-21 LAB — MAGNESIUM: Magnesium: 1.7 mg/dL (ref 1.7–2.4)

## 2020-10-21 SURGERY — EGD (ESOPHAGOGASTRODUODENOSCOPY)
Anesthesia: Monitor Anesthesia Care | Laterality: Left

## 2020-10-21 MED ORDER — POTASSIUM CHLORIDE CRYS ER 20 MEQ PO TBCR: 20.0000 meq | EXTENDED_RELEASE_TABLET | Freq: Every day | ORAL | 0 refills | Status: DC

## 2020-10-21 MED ORDER — MAGNESIUM SULFATE 2 GM/50ML IV SOLN
2.0000 g | Freq: Once | INTRAVENOUS | Status: AC
  Administered 2020-10-21: 2 g via INTRAVENOUS
  Filled 2020-10-21: qty 50

## 2020-10-21 MED ORDER — PROPOFOL 500 MG/50ML IV EMUL
INTRAVENOUS | Status: DC | PRN
  Administered 2020-10-21: 75 ug/kg/min via INTRAVENOUS

## 2020-10-21 MED ORDER — SODIUM CHLORIDE 0.9 % IV SOLN: INTRAVENOUS | Status: DC

## 2020-10-21 MED ORDER — ACETAMINOPHEN-CODEINE #2 300-15 MG PO TABS
1.0000 | ORAL_TABLET | ORAL | 0 refills | Status: AC | PRN
Start: 2020-10-21 — End: 2020-10-26

## 2020-10-21 MED ORDER — POTASSIUM & SODIUM PHOSPHATES 280-160-250 MG PO PACK: 1.0000 | PACK | Freq: Three times a day (TID) | ORAL | 0 refills | Status: AC

## 2020-10-21 MED ORDER — POTASSIUM & SODIUM PHOSPHATES 280-160-250 MG PO PACK
1.0000 | PACK | Freq: Three times a day (TID) | ORAL | Status: DC
  Filled 2020-10-21: qty 1

## 2020-10-21 MED ORDER — MAGNESIUM 500 MG PO TABS: 500.0000 mg | ORAL_TABLET | Freq: Every day | ORAL | 0 refills | Status: AC

## 2020-10-21 MED ORDER — LIDOCAINE 2% (20 MG/ML) 5 ML SYRINGE
INTRAMUSCULAR | Status: DC | PRN
  Administered 2020-10-21: 80 mg via INTRAVENOUS

## 2020-10-21 MED ORDER — LACTATED RINGERS IV SOLN
INTRAVENOUS | Status: AC | PRN
  Administered 2020-10-21: 10 mL/h via INTRAVENOUS

## 2020-10-21 MED ORDER — DIPHENOXYLATE-ATROPINE 2.5-0.025 MG PO TABS: 1.0000 | ORAL_TABLET | Freq: Four times a day (QID) | ORAL | 0 refills | Status: DC | PRN

## 2020-10-21 MED ORDER — POTASSIUM PHOSPHATES 15 MMOLE/5ML IV SOLN
10.0000 mmol | Freq: Once | INTRAVENOUS | Status: AC
  Administered 2020-10-21: 10 mmol via INTRAVENOUS
  Filled 2020-10-21: qty 3.33

## 2020-10-21 MED ORDER — MAGNESIUM SULFATE 2 GM/50ML IV SOLN
2.0000 g | Freq: Once | INTRAVENOUS | Status: DC
  Filled 2020-10-21 (×2): qty 50

## 2020-10-21 MED ORDER — NYSTATIN 100000 UNIT/ML MT SUSP: 5.0000 mL | Freq: Four times a day (QID) | OROMUCOSAL | 0 refills | Status: DC

## 2020-10-21 MED ORDER — PROPOFOL 10 MG/ML IV BOLUS
INTRAVENOUS | Status: DC | PRN
Start: 2020-10-21 — End: 2020-10-21
  Administered 2020-10-21 (×2): 10 mg via INTRAVENOUS

## 2020-10-21 NOTE — Anesthesia Postprocedure Evaluation (Signed)
Anesthesia Post Note  Patient: [name]  Procedure(s) Performed: ESOPHAGOGASTRODUODENOSCOPY (EGD) (Left) BIOPSY     Patient location during evaluation: Endoscopy Anesthesia Type: MAC Level of consciousness: awake and alert Pain management: pain level controlled Vital Signs Assessment: post-procedure vital signs reviewed and stable Respiratory status: spontaneous breathing, nonlabored ventilation and respiratory function stable Cardiovascular status: blood pressure returned to baseline and stable Postop Assessment: no apparent nausea or vomiting Anesthetic complications: no   No notable events documented.  Last Vitals:  Vitals:   10/21/20 1258 10/21/20 1326  BP: (!) 152/52 (!) 156/77  Pulse: 69 75  Resp: (!) 24 16  Temp:  36.8 C  SpO2: 95% 96%    Last Pain:  Vitals:   10/21/20 1326  TempSrc: Oral  PainSc:                  [name]

## 2020-10-21 NOTE — Anesthesia Preprocedure Evaluation (Addendum)
Anesthesia Evaluation  Patient identified by MRN, date of birth, ID band Patient awake    Reviewed: Allergy & Precautions, NPO status , Patient's Chart, lab work & pertinent test results  Airway Mallampati: III  TM Distance: >3 FB Neck ROM: Full    Dental no notable dental hx. (+) Lower Dentures, Upper Dentures   Pulmonary neg pulmonary ROS, former smoker,    Pulmonary exam normal breath sounds clear to auscultation       Cardiovascular hypertension, Pt. on medications + CAD and + Cardiac Stents (2010)  Normal cardiovascular exam+ Valvular Problems/Murmurs (TAVR in 2019)  Rhythm:Regular Rate:Normal  ECHO 2020:   1. The left ventricle has low normal systolic function, with an ejection  fraction of 50-55%. The cavity size was normal. There is moderate  asymmetric left ventricular hypertrophy. Left ventricular diastolic  Doppler parameters are consistent with  pseudonormalization. Elevated left ventricular end-diastolic pressure.  2. The right ventricle has normal systolic function. The cavity was  normal. There is no increase in right ventricular wall thickness.  3. Mild thickening of the mitral valve leaflet. Mild calcification of the  mitral valve leaflet. There is moderate mitral annular calcification  present.  4. The aorta is normal unless otherwise noted.  5. - TAVR: Post TAVR with 26 mm Sapien 3 no PVL and similar gradients to  June 2019 mean 8 to 9 mmHg peak 14 to 18 mmHg.    Neuro/Psych PSYCHIATRIC DISORDERS Anxiety Depression negative neurological ROS     GI/Hepatic Neg liver ROS, GERD  ,dysphagia   Endo/Other  Hypothyroidism   Renal/GU Renal Insufficiency  negative genitourinary   Musculoskeletal  (+) Arthritis ,   Abdominal   Peds negative pediatric ROS (+)  Hematology negative hematology ROS (+)   Anesthesia Other Findings H/o breast cancer  Reproductive/Obstetrics negative OB ROS                            Anesthesia Physical Anesthesia Plan  ASA: 3  Anesthesia Plan: MAC   Post-op Pain Management:    Induction: Intravenous  PONV Risk Score and Plan: 2 and Propofol infusion, TIVA and Treatment may vary due to age or medical condition  Airway Management Planned: Natural Airway and Nasal Cannula  Additional Equipment:   Intra-op Plan:   Post-operative Plan:   Informed Consent: I have reviewed the patients History and Physical, chart, labs and discussed the procedure including the risks, benefits and alternatives for the proposed anesthesia with the patient or authorized representative who has indicated his/her understanding and acceptance.   Patient has DNR.  Continue DNR.     Plan Discussed with: CRNA and Anesthesiologist  Anesthesia Plan Comments: (Patient would like NO chest compressions or defibrillation. OK for intubation if needed for the procedure and any IV medications that would help. Norton Blizzard, MD  )       Anesthesia Quick Evaluation

## 2020-10-21 NOTE — Progress Notes (Signed)
Nutrition Brief Note  Patient identified on the Malnutrition Screening Tool (MST) Report.  Admitting Dx: Diarrhea [R19.7] Acute kidney injury superimposed on chronic kidney disease ([place]) [N17.9, N18.9] Dehydration [E86.0] PMH:  Past Medical History:  Diagnosis Date   Aortic stenosis, severe    a. 07/2017: s/p TAVR w/ an Edwards Sapien 3 THV (size 26 mm, model # [id number], serial # [id number])   CAD (coronary artery disease)    a. 2010: s/p stent to RCA   Chronic kidney disease, stage III (moderate) (HCC)    Chronic sinusitis    Depressive disorder    History of breast cancer    a. s/p lumpectomies and XRT   Hyperlipemia    Hypertension    Hypothyroid    Insomnia    Memory loss    Osteoarthritis    Reflux esophagitis    Pt underwent EGD today which showed very thick whitish coating of middle and distal esophagus causing narrowing, no mass observed, biopsies done and pending. GI office to call with results as pt wanting to go home. Per MD, unsure about diagnosis of Candida. Pt started on nystatin swish and swallow.    Medications:  Scheduled Meds:  amLODipine  2.5 mg Oral Daily   atorvastatin  10 mg Oral Daily   buPROPion  150 mg Oral q morning   cloNIDine  0.1 mg Oral BID   levothyroxine  50 mcg Oral Q0600   metoprolol succinate  25 mg Oral Daily   potassium & sodium phosphates  1 packet Oral TID WC & HS   potassium chloride  40 mEq Oral BID  Continuous Infusions:  magnesium sulfate bolus IVPB     potassium PHOSPHATE IVPB (in mmol) 10 mmol (10/21/20 1516)   Labs: Recent Labs  Lab 10/19/20 0432 10/20/20 0307 10/21/20 0316  NA 134* 134* 135  K 3.4* 2.9* 3.7  CL 105 104 105  CO2 21* 21* 23  BUN 21 9 7*  CREATININE 1.07* 0.77 0.73  CALCIUM 8.7* 8.8* 8.9  MG  --  1.3* 1.7  PHOS  --  1.6* 2.3*  GLUCOSE 119* 114* 117*   Assessment: Wt Readings from Last 10 Encounters:  10/21/20 71.7 kg  09/26/20 75 kg  08/29/20 75.8 kg  07/29/20 74.8 kg  06/25/20 74.6 kg   06/16/20 76.1 kg  05/27/20 74.6 kg  04/29/20 75 kg  03/26/20 77.7 kg  03/24/20 77.5 kg  Body mass index is 31.93 kg/m. No significant weight changes noted.   Current diet order is soft. Pt is consuming approximately 60% of meals at this time, though intake noted to be improving. Oral intake was encouraged. Per MD, pt is medically stabilized and requesting discharge home. Discharge orders signed.   No nutrition interventions warranted at this time. If nutrition issues arise, please consult RD.   Larkin Ina, MS, RD, LDN (she/her/hers) RD pager number and weekend/on-call pager number located in [place].

## 2020-10-21 NOTE — Interval H&P Note (Signed)
History and Physical Interval Note:  10/21/2020 12:14 PM  [name]  has presented today for surgery, with the diagnosis of DYSPHAGIA/ABNORMAL BARIUM SWALLOW.  The various methods of treatment have been discussed with the patient and family. After consideration of risks, benefits and other options for treatment, the patient has consented to  Procedure(s) with comments: ESOPHAGOGASTRODUODENOSCOPY (EGD) (Left) - Abnormal barium swallow as a surgical intervention.  The patient's history has been reviewed, patient examined, no change in status, stable for surgery.  I have reviewed the patient's chart and labs.  Questions were answered to the patient's satisfaction.     Xayne Brumbaugh D

## 2020-10-21 NOTE — Discharge Summary (Signed)
Physician Discharge Summary  [name] [id number] DOB: 1926-02-27 DOA: 10/18/2020  PCP: Ernestene Kiel, MD  Admit date: 10/18/2020 Discharge date: 10/21/2020  Admitted From: Home Disposition: Home  Recommendations for Outpatient Follow-up:  Follow up with PCP in 1-2 weeks Please obtain BMP/CBC/magnesium/phosphorus in one week Please follow up on the following pending results: EGD biopsy pending,  Home Health: Not applicable Equipment/Devices: Not applicable  Discharge Condition: Stable CODE STATUS: DNR Diet recommendation: Low-salt diet.  Easily digestible soft food.  Reflux precautions.  Discharge summary: 87 year old female with medical history of CAD s/p stent to RCA, TAVR, CKDstage3a, hypertension, hypothyroidism, depression/anxiety, breast cancer on fulvestrant presenting to the emergency room with dehydration, diarrhea and fatigue.  it started about 3 weeks ago with frequent watery diarrhea occurring 3-4 times per day.  She had used 1 rounds of Z-Pak 2 weeks ago.  Continue to have loose watery stool, 7 times on the day of admission.  Denies any nausea vomiting abdominal pain cramping.  Denies any blood or mucus.  Fatigue and lightheadedness dizziness.  In the emergency room hemodynamically stable.  On room air.  Creatinine 1.61 with baseline of 1.1.  C. difficile negative.  Admitted with significant dehydration and chronic diarrhea along with multiple electrolyte abnormalities.  Found to have following conditions.  # Acute kidney injury superimposed on chronic kidney disease stage IIIa: Suspect due to ongoing chronic fluid loss from diarrhea.  Patient treated with IV fluids and now able to keep up with oral intake.  Kidney functions stabilized. Encourage oral intake.  Recheck in 1 week.  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   Patient is on at least 3 medications that can potentially cause microscopic colitis.   Losartan and Benicar will  discontinue  Diclofenac, will discontinue.  We will try codeine No. 2.  For arthritic pain.   Will continue Lipitor, if continues to have diarrhea, need to consider stopping this. C. difficile negative.  GI pathogen panel negative. Symptomatic treatment.  Clinically improving.  Will prescribe Lomotil.   # Essential hypertension: Blood pressure fairly stable.  Resume home medications except losartan.   # Coronary artery disease status post RCA stent, aortic stenosis status post TAVR: Stable.  On a statin.  Not on any antiplatelet therapy.   # Hypothyroidism: On Synthroid.  Adequate.  # Breast cancer on fulvestrant: Continue.  Takes injection every month and follows with Dr. Hinton Rao.  # Anxiety/depression: Resume Wellbutrin and sleep aid.  #Multiple electrolyte abnormalities: Patient had very low potassium magnesium and phosphorus.  Replaced aggressively with improvement of levels.  Will send home on scheduled replacement of magnesium, potassium and phosphorus.  Please recheck in 1 week to ensure stabilization.  #Intermittent oropharyngeal dysphagia: Patient complained of intermittent dysphagia.  Barium swallow esophagogram was done that showed narrowing of the distal esophagus. Underwent EGD today that showed very thick whitish coating of middle and distal esophagus causing narrowing.  No mass.  Biopsies done.  Unsure about diagnosis of Candida. Will start patient on nystatin swish and swallow.  Patient is medically stabilized.  She wants to go home.  Discharged home with change in medications.  Electrolyte supplements.  Dysphagia precautions. Biopsies are pending, GI office will call with results.  Discharge Diagnoses:  Principal Problem:   Acute kidney injury superimposed on chronic kidney disease ([place]) Active Problems:   Coronary artery disease involving native coronary artery of native heart without angina pectoris   Hyperlipidemia   Essential hypertension, benign   S/P TAVR  (  transcatheter aortic valve replacement)   History of breast cancer   Hypothyroidism, acquired   Dehydration    Discharge Instructions  Discharge Instructions     Diet - low sodium heart healthy   Complete by: As directed    Soft food, take water with meals and stay upright for at leat 1 hour   Increase activity slowly   Complete by: As directed       Allergies as of 10/21/2020       Reactions   Betadine [povidone Iodine] Other (See Comments)   Blisters   Latex Hives, Swelling   Zestril [lisinopril] Cough        Medication List     STOP taking these medications    diclofenac 75 MG EC tablet Commonly known as: VOLTAREN   losartan 25 MG tablet Commonly known as: COZAAR   olmesartan 40 MG tablet Commonly known as: BENICAR   polyethylene glycol 17 g packet Commonly known as: MIRALAX / GLYCOLAX       TAKE these medications    acetaminophen 500 MG tablet Commonly known as: TYLENOL Take 1,000 mg by mouth daily as needed for mild pain or headache.   acetaminophen-codeine 300-15 MG tablet Commonly known as: TYLENOL #2 Take 1 tablet by mouth every 4 (four) hours as needed for up to 5 days for moderate pain.   ALIGN PO Take 1 capsule by mouth daily.   amLODipine 2.5 MG tablet Commonly known as: NORVASC TAKE ONE (1) TABLET BY MOUTH ONCE DAILY What changed: See the new instructions.   amoxicillin 500 MG capsule Commonly known as: AMOXIL Take 1,000 mg by mouth once as needed (dental work).   atorvastatin 10 MG tablet Commonly known as: LIPITOR Take 10 mg by mouth daily.   Azelastine-Fluticasone 137-50 MCG/ACT Susp Place 1 spray into both nostrils 2 (two) times daily as needed (congestion).   buPROPion 150 MG 24 hr tablet Commonly known as: WELLBUTRIN XL Take 150 mg by mouth every morning.   busPIRone 5 MG tablet Commonly known as: BUSPAR Take 5 mg by mouth 3 (three) times daily as needed (anxiety).   cholecalciferol 25 MCG (1000 UNIT)  tablet Commonly known as: VITAMIN D3 Take 1,000 Units by mouth daily.   cloNIDine 0.1 MG tablet Commonly known as: CATAPRES Take 0.1 mg by mouth 2 (two) times daily.   diphenoxylate-atropine 2.5-0.025 MG tablet Commonly known as: LOMOTIL Take 1 tablet by mouth 4 (four) times daily as needed for diarrhea or loose stools.   Eszopiclone 3 MG Tabs Take 3 mg by mouth at bedtime.   fluticasone 50 MCG/ACT nasal spray Commonly known as: FLONASE Place 2 sprays into both nostrils daily as needed for allergies or rhinitis.   fulvestrant 250 MG/5ML injection Commonly known as: FASLODEX Inject into the muscle once. One injection each buttock over 1-2 minutes. Warm prior to use.   HYDROcodone-acetaminophen 5-325 MG tablet Commonly known as: NORCO/VICODIN Take 1-2 tablets by mouth every 6 (six) hours as needed for moderate pain.   levothyroxine 50 MCG tablet Commonly known as: SYNTHROID Take 50 mcg by mouth daily before breakfast.   Magnesium 500 MG Tabs Take 1 tablet (500 mg total) by mouth daily for 14 days.   metoprolol succinate 25 MG 24 hr tablet Commonly known as: TOPROL-XL Take 25 mg by mouth daily.   NEURIVA PO Take 1 capsule by mouth daily.   nystatin 100000 UNIT/ML suspension Commonly known as: MYCOSTATIN Take 5 mLs (500,000 Units total) by mouth 4 (four)  times daily.   OVER THE COUNTER MEDICATION Take 2 tablets by mouth daily. Bladder Control 360   polycarbophil 625 MG tablet Commonly known as: FIBERCON Take 625 mg by mouth daily.   potassium & sodium phosphates 280-160-250 MG Pack Commonly known as: PHOS-NAK Take 1 packet by mouth 4 (four) times daily -  with meals and at bedtime for 14 days.   potassium chloride SA 20 MEQ tablet Commonly known as: KLOR-CON Take 1 tablet (20 mEq total) by mouth daily for 14 days.   vitamin C 1000 MG tablet Take 1,000 mg by mouth daily.        Allergies  Allergen Reactions   Betadine [Povidone Iodine] Other (See  Comments)    Blisters    Latex Hives and Swelling   Zestril [Lisinopril] Cough    Consultations: Gastroenterology   Procedures/Studies: DG Abd 1 View  Result Date: 10/18/2020 CLINICAL DATA:  Diarrhea for several weeks EXAM: ABDOMEN - 1 VIEW COMPARISON:  None. FINDINGS: Scattered large and small bowel gas is noted. No obstructive changes are seen. Diffuse vascular calcifications and degenerative changes of lumbar spine are seen. No free air is noted. No abnormal mass or abnormal calcifications are seen. IMPRESSION: No acute abnormality in the abdomen. Electronically Signed   By: Inez Catalina M.D.   On: 10/18/2020 21:35   DG ESOPHAGUS W SINGLE CM (SOL OR THIN BA)  Result Date: 10/20/2020 CLINICAL DATA:  Dysphagia R13.10 (ICD-10-CM). Additional history provided: Patient reports 6 months of progressive dysphagia, feeling as though foods and liquids are becoming stuck in the region of the throat, weight loss. EXAM: ESOPHOGRAM/BARIUM SWALLOW TECHNIQUE: A single contrast examination was performed using barium contrast. FLUOROSCOPY TIME:  Fluoroscopy Time:  2 minutes, 30 seconds. Radiation Exposure Index (if provided by the fluoroscopic device): 33.3 mGy Number of Acquired Spot Images: 4 COMPARISON:  CT of the chest 07/25/2017. FINDINGS: The esophagus is mildly patulous and there is a paucity of esophageal peristalsis. Narrowed appearance of the gastroesophageal junction. Significantly delayed, and intermittent, passage of contrast from the esophagus into the stomach. No gastroesophageal reflux identified. No appreciable hiatal hernia. Due to patient apprehension, a 13 mm barium tablet was not administered. IMPRESSION: Mildly patulous esophagus with a paucity of esophageal peristalsis. There is a narrowed appearance of the gastroesophageal junction and there is significantly delayed, and intermittent, passage of contrast from the esophagus into the stomach. Findings are suggestive of a stricture at the level  of the distal esophagus/GE junction, and endoscopy is recommended for further evaluation. Electronically Signed   By: Kellie Simmering DO   On: 10/20/2020 12:26   (Echo, Carotid, EGD, Colonoscopy, ERCP)    Subjective: Patient seen and examined.  Denies any complaints.  Seen in the evening for discharge readiness.  Patient wants to go home. Educated about the soft diet to take. 3 loose watery stool for last 24 hours.   Discharge Exam: Vitals:   10/21/20 1258 10/21/20 1326  BP: (!) 152/52 (!) 156/77  Pulse: 69 75  Resp: (!) 24 16  Temp:  98.2 F (36.8 C)  SpO2: 95% 96%   Vitals:   10/21/20 1238 10/21/20 1248 10/21/20 1258 10/21/20 1326  BP: (!) 143/71 (!) 142/59 (!) 152/52 (!) 156/77  Pulse: 75 79 69 75  Resp: (!) 24 (!) 27 (!) 24 16  Temp: (!) 96.8 F (36 C)   98.2 F (36.8 C)  TempSrc: Temporal   Oral  SpO2: 100% 95% 95% 96%  Weight:  Height:        General: Pt is alert, awake, not in acute distress Cardiovascular: RRR, S1/S2 +, no rubs, no gallops Respiratory: CTA bilaterally, no wheezing, no rhonchi Abdominal: Soft, NT, ND, bowel sounds + Extremities: no edema, no cyanosis    The results of significant diagnostics from this hospitalization (including imaging, microbiology, ancillary and laboratory) are listed below for reference.     Microbiology: Recent Results (from the past 240 hour(s))  C Difficile Quick Screen w PCR reflex     Status: None   Collection Time: 10/18/20  5:04 PM   Specimen: STOOL  Result Value Ref Range Status   C Diff antigen NEGATIVE NEGATIVE Final   C Diff toxin NEGATIVE NEGATIVE Final   C Diff interpretation No C. difficile detected.  Final    Comment: Performed at [facility], [place] [address], [place], [place] [address]  Gastrointestinal Panel by PCR , Stool     Status: None   Collection Time: 10/18/20  5:04 PM   Specimen: Stool  Result Value Ref Range Status   Campylobacter species NOT DETECTED NOT DETECTED Final    Plesimonas shigelloides NOT DETECTED NOT DETECTED Final   Salmonella species NOT DETECTED NOT DETECTED Final   Yersinia enterocolitica NOT DETECTED NOT DETECTED Final   Vibrio species NOT DETECTED NOT DETECTED Final   Vibrio cholerae NOT DETECTED NOT DETECTED Final   Enteroaggregative E coli (EAEC) NOT DETECTED NOT DETECTED Final   Enteropathogenic E coli (EPEC) NOT DETECTED NOT DETECTED Final   Enterotoxigenic E coli (ETEC) NOT DETECTED NOT DETECTED Final   Shiga like toxin producing E coli (STEC) NOT DETECTED NOT DETECTED Final   Shigella/Enteroinvasive E coli (EIEC) NOT DETECTED NOT DETECTED Final   Cryptosporidium NOT DETECTED NOT DETECTED Final   Cyclospora cayetanensis NOT DETECTED NOT DETECTED Final   Entamoeba histolytica NOT DETECTED NOT DETECTED Final   Giardia lamblia NOT DETECTED NOT DETECTED Final   Adenovirus F40/41 NOT DETECTED NOT DETECTED Final   Astrovirus NOT DETECTED NOT DETECTED Final   Norovirus GI/GII NOT DETECTED NOT DETECTED Final   Rotavirus A NOT DETECTED NOT DETECTED Final   Sapovirus (I, II, IV, and V) NOT DETECTED NOT DETECTED Final    Comment: Performed at [facility], [address] [place], [place] [address]  SARS CORONAVIRUS 2 (TAT 6-24 HRS) Nasopharyngeal Nasopharyngeal Swab     Status: None   Collection Time: 10/18/20  7:28 PM   Specimen: Nasopharyngeal Swab  Result Value Ref Range Status   SARS Coronavirus 2 NEGATIVE NEGATIVE Final    Comment: (NOTE) SARS-CoV-2 target nucleic acids are NOT DETECTED.  The SARS-CoV-2 RNA is generally detectable in upper and lower respiratory specimens during the acute phase of infection. Negative results do not preclude SARS-CoV-2 infection, do not rule out co-infections with other pathogens, and should not be used as the sole basis for treatment or other patient management decisions. Negative results must be combined with clinical observations, patient history, and epidemiological information. The  expected result is Negative.  Fact Sheet for Patients: SugarRoll.be  Fact Sheet for Healthcare Providers: https://www.woods-mathews.com/  This test is not yet approved or cleared by the [place] FDA and  has been authorized for detection and/or diagnosis of SARS-CoV-2 by FDA under an Emergency Use Authorization (EUA). This EUA will remain  in effect (meaning this test can be used) for the duration of the COVID-19 declaration under Se ction 564(b)(1) of the Act, 21 U.S.C. section 360bbb-3(b)(1), unless the  authorization is terminated or revoked sooner.  Performed at [facility], [place] [address], [place], [place] [address]      Labs: BNP (last 3 results) No results for input(s): BNP in the last 8760 hours. Basic Metabolic Panel: Recent Labs  Lab 10/18/20 1615 10/19/20 0432 10/20/20 0307 10/21/20 0316  NA 136 134* 134* 135  K 3.5 3.4* 2.9* 3.7  CL 108 105 104 105  CO2 17* 21* 21* 23  GLUCOSE 89 119* 114* 117*  BUN 28* 21 9 7*  CREATININE 1.61* 1.07* 0.77 0.73  CALCIUM 8.6* 8.7* 8.8* 8.9  MG  --   --  1.3* 1.7  PHOS  --   --  1.6* 2.3*   Liver Function Tests: Recent Labs  Lab 10/18/20 1615  AST 13*  ALT 13  ALKPHOS 44  BILITOT 1.0  PROT 6.0*  ALBUMIN 3.1*   No results for input(s): LIPASE, AMYLASE in the last 168 hours. No results for input(s): AMMONIA in the last 168 hours. CBC: Recent Labs  Lab 10/18/20 1615 10/19/20 0432 10/20/20 0307 10/21/20 0316  WBC 6.4 7.7 7.3 9.8  NEUTROABS 4.3  --   --  7.3  HGB 10.0* 10.6* 10.5* 10.7*  HCT 30.2* 32.5* 32.1* 32.1*  MCV 92.6 92.9 91.2 90.2  PLT 193 216 241 263   Cardiac Enzymes: No results for input(s): CKTOTAL, CKMB, CKMBINDEX, TROPONINI in the last 168 hours. BNP: Invalid input(s): POCBNP CBG: No results for input(s): GLUCAP in the last 168 hours. D-Dimer No results for input(s): DDIMER in the last 72 hours. Hgb A1c No results for input(s): HGBA1C  in the last 72 hours. Lipid Profile No results for input(s): CHOL, HDL, LDLCALC, TRIG, CHOLHDL, LDLDIRECT in the last 72 hours. Thyroid function studies No results for input(s): TSH, T4TOTAL, T3FREE, THYROIDAB in the last 72 hours.  Invalid input(s): FREET3 Anemia work up No results for input(s): VITAMINB12, FOLATE, FERRITIN, TIBC, IRON, RETICCTPCT in the last 72 hours. Urinalysis    Component Value Date/Time   COLORURINE STRAW (A) 08/02/2017 1042   APPEARANCEUR CLEAR 08/02/2017 1042   LABSPEC 1.008 08/02/2017 1042   PHURINE 6.0 08/02/2017 1042   GLUCOSEU NEGATIVE 08/02/2017 1042   HGBUR SMALL (A) 08/02/2017 1042   BILIRUBINUR NEGATIVE 08/02/2017 1042   KETONESUR NEGATIVE 08/02/2017 1042   PROTEINUR NEGATIVE 08/02/2017 1042   NITRITE NEGATIVE 08/02/2017 1042   LEUKOCYTESUR MODERATE (A) 08/02/2017 1042   Sepsis Labs Invalid input(s): PROCALCITONIN,  WBC,  LACTICIDVEN Microbiology Recent Results (from the past 240 hour(s))  C Difficile Quick Screen w PCR reflex     Status: None   Collection Time: 10/18/20  5:04 PM   Specimen: STOOL  Result Value Ref Range Status   C Diff antigen NEGATIVE NEGATIVE Final   C Diff toxin NEGATIVE NEGATIVE Final   C Diff interpretation No C. difficile detected.  Final    Comment: Performed at [facility], [place] [address] [place], [place] [address]  Gastrointestinal Panel by PCR , Stool     Status: None   Collection Time: 10/18/20  5:04 PM   Specimen: Stool  Result Value Ref Range Status   Campylobacter species NOT DETECTED NOT DETECTED Final   Plesimonas shigelloides NOT DETECTED NOT DETECTED Final   Salmonella species NOT DETECTED NOT DETECTED Final   Yersinia enterocolitica NOT DETECTED NOT DETECTED Final   Vibrio species NOT DETECTED NOT DETECTED Final   Vibrio cholerae NOT DETECTED NOT DETECTED Final   Enteroaggregative E coli (  EAEC) NOT DETECTED NOT DETECTED Final   Enteropathogenic E coli (EPEC) NOT DETECTED NOT  DETECTED Final   Enterotoxigenic E coli (ETEC) NOT DETECTED NOT DETECTED Final   Shiga like toxin producing E coli (STEC) NOT DETECTED NOT DETECTED Final   Shigella/Enteroinvasive E coli (EIEC) NOT DETECTED NOT DETECTED Final   Cryptosporidium NOT DETECTED NOT DETECTED Final   Cyclospora cayetanensis NOT DETECTED NOT DETECTED Final   Entamoeba histolytica NOT DETECTED NOT DETECTED Final   Giardia lamblia NOT DETECTED NOT DETECTED Final   Adenovirus F40/41 NOT DETECTED NOT DETECTED Final   Astrovirus NOT DETECTED NOT DETECTED Final   Norovirus GI/GII NOT DETECTED NOT DETECTED Final   Rotavirus A NOT DETECTED NOT DETECTED Final   Sapovirus (I, II, IV, and V) NOT DETECTED NOT DETECTED Final    Comment: Performed at [facility], [address] [place], [place] [address]  SARS CORONAVIRUS 2 (TAT 6-24 HRS) Nasopharyngeal Nasopharyngeal Swab     Status: None   Collection Time: 10/18/20  7:28 PM   Specimen: Nasopharyngeal Swab  Result Value Ref Range Status   SARS Coronavirus 2 NEGATIVE NEGATIVE Final    Comment: (NOTE) SARS-CoV-2 target nucleic acids are NOT DETECTED.  The SARS-CoV-2 RNA is generally detectable in upper and lower respiratory specimens during the acute phase of infection. Negative results do not preclude SARS-CoV-2 infection, do not rule out co-infections with other pathogens, and should not be used as the sole basis for treatment or other patient management decisions. Negative results must be combined with clinical observations, patient history, and epidemiological information. The expected result is Negative.  Fact Sheet for Patients: SugarRoll.be  Fact Sheet for Healthcare Providers: https://www.woods-mathews.com/  This test is not yet approved or cleared by the [place] FDA and  has been authorized for detection and/or diagnosis of SARS-CoV-2 by FDA under an Emergency Use Authorization (EUA). This EUA will  remain  in effect (meaning this test can be used) for the duration of the COVID-19 declaration under Se ction 564(b)(1) of the Act, 21 U.S.C. section 360bbb-3(b)(1), unless the authorization is terminated or revoked sooner.  Performed at [facility], [place] [address], [place], [place] [address]      Time coordinating discharge:  32 minutes  SIGNED:   Barb Merino, MD  [facility] 10/21/2020, 3:49 PM

## 2020-10-21 NOTE — Op Note (Signed)
[facility], [facility] Patient Name: [name] Procedure Date: 10/21/2020 MRN: [id number] Attending MD: Carol Ada , MD Date of Birth: 11/02/1925 CSN: [id number] Age: 87 Admit Type: Inpatient Procedure:                Upper GI endoscopy Indications:              Dysphagia Providers:                Carol Ada, MD, Dulcy Fanny, Benetta Spar, Technician Referring MD:              Medicines:                Propofol per Anesthesia Complications:            No immediate complications. Estimated Blood Loss:     Estimated blood loss was minimal. Procedure:                Pre-Anesthesia Assessment:                           - Prior to the procedure, a History and Physical                            was performed, and patient medications and                            allergies were reviewed. The patient's tolerance of                            previous anesthesia was also reviewed. The risks                            and benefits of the procedure and the sedation                            options and risks were discussed with the patient.                            All questions were answered, and informed consent                            was obtained. Prior Anticoagulants: The patient has                            taken no previous anticoagulant or antiplatelet                            agents. ASA Grade Assessment: III - A patient with                            severe systemic disease. After reviewing the risks                            and benefits,  the patient was deemed in                            satisfactory condition to undergo the procedure.                           - Sedation was administered by an anesthesia                            professional. Deep sedation was attained.                           After obtaining informed consent, the endoscope was                            passed under direct vision. Throughout the                             procedure, the patient's blood pressure, pulse, and                            oxygen saturations were monitored continuously. The                            [id number] Olympus endoscope was introduced                            through the mouth, and advanced to the second part                            of duodenum. The upper GI endoscopy was                            accomplished without difficulty. The patient                            tolerated the procedure well. Scope In: Scope Out: Findings:      Diffuse, white plaques were found in the entire esophagus. Biopsies were       taken with a cold forceps for histology.      A 3 cm hiatal hernia was present.      The stomach was normal.      The examined duodenum was normal.      A very thick coating of the esophagus was identified. It was not clear       if this was representative of a heavy Candidal esophagitis. The patient       was treated with azythromycin for her diarrhea a couple of weeks ago       with her PCP. Cold biopsies were obtained. The distal esophagus was       negative for any obvious stricture, but the thick coating did inhibit       visualization of this area. The Z-line was clearly visualized and there       was a 3 cm hiatal hernia. With some washings in this area there was a       notable  stasis of fluid consistent with a motility issue. Impression:               - Esophageal plaques were found. Biopsied.                           - 3 cm hiatal hernia.                           - Normal stomach.                           - Normal examined duodenum. Moderate Sedation:      Not Applicable - Patient had care per Anesthesia. Recommendation:           - Return patient to hospital ward for ongoing care.                           - Soft diet.                           - Continue present medications.                           - Await pathology results.                           -  Hold diclofenac and olmesartan as potential                            sources for her diarrhea. Procedure Code(s):        --- Professional ---                           [phone], Esophagogastroduodenoscopy, flexible,                            transoral; with biopsy, single or multiple Diagnosis Code(s):        --- Professional ---                           R13.10, Dysphagia, unspecified                           K22.9, Disease of esophagus, unspecified                           K44.9, Diaphragmatic hernia without obstruction or                            gangrene CPT copyright 2019 [employer]. All rights reserved. The codes documented in this report are preliminary and upon coder review may  be revised to meet current compliance requirements. Carol Ada, MD Carol Ada, MD 10/21/2020 12:45:04 PM This report has been signed electronically. Number of Addenda: 0

## 2020-10-21 NOTE — Evaluation (Signed)
Occupational Therapy Evaluation Patient Details Name: [name] MRN: [id number] DOB: 1925-06-18 Today's Date: 10/21/2020    History of Present Illness 87 yo female admitted with AKI. Hx of CKD, CAD s/p stent, TAVR, breat ca. [facility] is from [facility]   Clinical Impression   Patient resides at [facility]F, ambulates with 3 wheeled walker and can perform BADL tasks without assist. Family provides supervision for bathing, patient also has meals brought to her room. Overall patient supervision level with toilet transfer, g/h standing at sink side, and bed mobility. Min A for clothing management as brief was too loose. No overt loss of balance noted during session with use of rolling walker, does request to get back to bed at end of session "maybe I can sleep more." Acute OT to follow to maximize endurance necessary for mod I at ILF, do not anticipate any OT needs at D/C.     Follow Up Recommendations  No OT follow up    Equipment Recommendations  None recommended by OT           Precautions / Restrictions Precautions Precautions: Fall Restrictions Weight Bearing Restrictions: No      Mobility Bed Mobility Overal bed mobility: Needs Assistance Bed Mobility: Supine to Sit;Sit to Supine     Supine to sit: Supervision;HOB elevated Sit to supine: Supervision   General bed mobility comments: S for safety/line management. increased time to push trunk up to sitting position    Transfers Overall transfer level: Needs assistance Equipment used: Rolling walker (2 wheeled) Transfers: Sit to/from Stand Sit to Stand: Supervision              Balance Overall balance assessment: Needs assistance Sitting-balance support: Feet supported Sitting balance-Leahy Scale: Fair     Standing balance support: No upper extremity supported Standing balance-Leahy Scale: Fair Standing balance comment: could stand at sink without UE support                           ADL either  performed or assessed with clinical judgement   ADL Overall ADL's : Needs assistance/impaired     Grooming: Oral care;Wash/dry face;Wash/dry hands;Supervision/safety;Standing   Upper Body Bathing: Set up;Sitting   Lower Body Bathing: Supervison/ safety;Sit to/from stand   Upper Body Dressing : Set up;Sitting   Lower Body Dressing: Supervision/safety;Sit to/from stand;Sitting/lateral leans   Toilet Transfer: Supervision/safety;Ambulation;RW;Regular Toilet;Grab bars Toilet Transfer Details (indicate cue type and reason): supervision for safety, no physical assistance to power up to standing Toileting- Clothing Manipulation and Hygiene: Minimal assistance;Sitting/lateral lean;Sit to/from stand Toileting - Clothing Manipulation Details (indicate cue type and reason): brief on very loose, assist patient with tightening to keep up over hips     Functional mobility during ADLs: Supervision/safety;Rolling walker General ADL Comments: patient appears close to her baseline with self care tasks, S for safety      Pertinent Vitals/Pain Pain Assessment: No/denies pain     Hand Dominance  (did not specify)   Extremity/Trunk Assessment Upper Extremity Assessment Upper Extremity Assessment: Overall WFL for tasks assessed   Lower Extremity Assessment Lower Extremity Assessment: Defer to PT evaluation   Cervical / Trunk Assessment Cervical / Trunk Assessment: Kyphotic   Communication Communication Communication: HOH   Cognition Arousal/Alertness: Awake/alert Behavior During Therapy: WFL for tasks assessed/performed Overall Cognitive Status: Within Functional Limits for tasks assessed  Home Living Family/patient expects to be discharged to:: Private residence Living Arrangements: Alone Available Help at Discharge: Family Type of Home: Independent living facility Home Access: Level entry     [place]: One  level     Bathroom Shower/Tub: Chief Strategy Officer: Grab bars - toilet;Grab bars - tub/shower (3 wheeled walker)          Prior Functioning/Environment Level of Independence: Needs assistance  Gait / Transfers Assistance Needed: walks with 3 wheeled walker ADL's / Homemaking Assistance Needed: meals brought to her apt. family provide supervision for bathing/showering            OT Problem List: Decreased activity tolerance      OT Treatment/Interventions: Self-care/ADL training;Patient/family education;Balance training;Therapeutic activities;Therapeutic exercise    OT Goals(Current goals can be found in the care plan section) Acute Rehab OT Goals Patient Stated Goal: use bathroom OT Goal Formulation: With patient Time For Goal Achievement: 11/04/20 Potential to Achieve Goals: Good  OT Frequency: Min 2X/week    AM-PAC OT "6 Clicks" Daily Activity     Outcome Measure Help from another person eating meals?: None Help from another person taking care of personal grooming?: A Little Help from another person toileting, which includes using toliet, bedpan, or urinal?: A Little Help from another person bathing (including washing, rinsing, drying)?: A Little Help from another person to put on and taking off regular upper body clothing?: A Little Help from another person to put on and taking off regular lower body clothing?: A Little 6 Click Score: 19   End of Session Equipment Utilized During Treatment: Rolling walker Nurse Communication: Mobility status  Activity Tolerance: Patient tolerated treatment well Patient left: in bed;with call bell/phone within reach;with bed alarm set  OT Visit Diagnosis: Other abnormalities of gait and mobility (R26.89)                Time: [id number] OT Time Calculation (min): 18 min Charges:  OT General Charges $OT Visit: 1 Visit OT Evaluation $OT Eval Low Complexity: 1 Low  Delbert Phenix OT OT pager: [place] 10/21/2020, 11:58 AM

## 2020-10-21 NOTE — Transfer of Care (Signed)
Immediate Anesthesia Transfer of Care Note  Patient: [name]  Procedure(s) Performed: ESOPHAGOGASTRODUODENOSCOPY (EGD) (Left) BIOPSY  Patient Location: Endoscopy Unit  Anesthesia Type:MAC  Level of Consciousness: drowsy and patient cooperative  Airway & Oxygen Therapy: Patient Spontanous Breathing and Patient connected to face mask oxygen  Post-op Assessment: Report given to RN and Post -op Vital signs reviewed and stable  Post vital signs: Reviewed and stable  Last Vitals:  Vitals Value Taken Time  BP 143/71 10/21/20 1238  Temp 36 C 10/21/20 1238  Pulse 74 10/21/20 1238  Resp 24 10/21/20 1238  SpO2 100 % 10/21/20 1238  Vitals shown include unvalidated device data.  Last Pain:  Vitals:   10/21/20 1238  TempSrc: Temporal  PainSc: 0-No pain      Patients Stated Pain Goal: 0 (80/63/86 8548)  Complications: No notable events documented.

## 2020-10-22 ENCOUNTER — Encounter (HOSPITAL_COMMUNITY): Payer: Self-pay | Admitting: Gastroenterology

## 2020-10-22 ENCOUNTER — Other Ambulatory Visit: Payer: Self-pay | Admitting: Internal Medicine

## 2020-10-22 LAB — SURGICAL PATHOLOGY

## 2020-10-22 MED ORDER — FLUCONAZOLE 100 MG PO TABS: 100.0000 mg | ORAL_TABLET | Freq: Every day | ORAL | 0 refills | Status: AC

## 2020-10-22 NOTE — Progress Notes (Signed)
Sent diflucan 100 mg twice daily for 14 days pending final biopsy. Prelim shows fungus.

## 2020-10-24 ENCOUNTER — Inpatient Hospital Stay: Payer: Medicare Other

## 2020-10-24 NOTE — Progress Notes (Signed)
Sent in request for DOS 08/19 to [employer].

## 2020-10-24 NOTE — Progress Notes (Deleted)
1320 Called patient in concerned regarding her missed appointment today. The patient stated that she had been in the hospital and that she had a rough night. She will cancel today and reschedule for next Friday on 10/27/20 at 1330. Dr. Hinton Rao aware of the above information and agrees.

## 2020-10-28 ENCOUNTER — Telehealth: Payer: Self-pay | Admitting: Oncology

## 2020-10-28 NOTE — Telephone Encounter (Signed)
10/28/20 Patient cancelled all appts.Did not want to rescheduled at this time.

## 2020-10-28 NOTE — Telephone Encounter (Signed)
10/28/20 Spoke with [place] [facility] and no appt scheduled for patients visit.

## 2020-10-31 ENCOUNTER — Ambulatory Visit: Payer: Medicare Other

## 2020-11-10 ENCOUNTER — Telehealth: Payer: Self-pay

## 2020-11-10 ENCOUNTER — Other Ambulatory Visit: Payer: Self-pay | Admitting: Oncology

## 2020-11-10 DIAGNOSIS — C50911 Malignant neoplasm of unspecified site of right female breast: Secondary | ICD-10-CM

## 2020-11-10 NOTE — Telephone Encounter (Addendum)
Pt scheduled for 1030 labs, & 11 am tomorrow with Dr Hinton Rao.  Pt I have a cancellation at 11 am tomorrow, would want labs also.  Nothing on Wed but we could look at later in the week if Tues doesn't workwould like to see you.      She hasn't been in office in while due to sickness. She has missed her Faslodex injections as well, which I sent telephone message to [facility] for that.

## 2020-11-11 ENCOUNTER — Encounter: Payer: Self-pay | Admitting: Oncology

## 2020-11-11 ENCOUNTER — Inpatient Hospital Stay (INDEPENDENT_AMBULATORY_CARE_PROVIDER_SITE_OTHER): Payer: Medicare Other | Admitting: Oncology

## 2020-11-11 ENCOUNTER — Inpatient Hospital Stay: Payer: Medicare Other

## 2020-11-11 ENCOUNTER — Other Ambulatory Visit: Payer: Self-pay | Admitting: Hematology and Oncology

## 2020-11-11 ENCOUNTER — Other Ambulatory Visit: Payer: Self-pay | Admitting: Oncology

## 2020-11-11 VITALS — BP 132/70 | HR 69 | Temp 98.2°F | Resp 18 | Ht 59.0 in | Wt 156.0 lb

## 2020-11-11 DIAGNOSIS — C50411 Malignant neoplasm of upper-outer quadrant of right female breast: Secondary | ICD-10-CM

## 2020-11-11 DIAGNOSIS — C50911 Malignant neoplasm of unspecified site of right female breast: Secondary | ICD-10-CM

## 2020-11-11 DIAGNOSIS — Z170 Estrogen receptor positive status [ER+]: Secondary | ICD-10-CM | POA: Diagnosis not present

## 2020-11-11 LAB — COMPREHENSIVE METABOLIC PANEL
Albumin: 4 (ref 3.5–5.0)
Calcium: 9.2 (ref 8.7–10.7)

## 2020-11-11 LAB — HEPATIC FUNCTION PANEL
ALT: 14 (ref 7–35)
AST: 25 (ref 13–35)
Alkaline Phosphatase: 55 (ref 25–125)
Bilirubin, Total: 0.8

## 2020-11-11 LAB — CBC: RBC: 3.58 — AB (ref 3.87–5.11)

## 2020-11-11 LAB — BASIC METABOLIC PANEL
BUN: 14 (ref 4–21)
CO2: 20 (ref 13–22)
Chloride: 105 (ref 99–108)
Creatinine: 1.3 — AB (ref 0.5–1.1)
Glucose: 111
Potassium: 3.9 (ref 3.4–5.3)
Sodium: 137 (ref 137–147)

## 2020-11-11 LAB — CBC AND DIFFERENTIAL
HCT: 33 — AB (ref 36–46)
Hemoglobin: 11.3 — AB (ref 12.0–16.0)
Neutrophils Absolute: 7.22
Platelets: 269 (ref 150–399)
WBC: 8.8

## 2020-11-11 NOTE — Progress Notes (Signed)
[name]  [address] [place],  [place]  [address] [phone]  Clinic Day:  11/11/2020  Referring physician: Ernestene Kiel, MD   This document serves as a record of services personally performed by [name], MD. It was created on their behalf by [name],[name], a trained medical scribe. The creation of this record is based on the scribe's personal observations and the provider's statements to them.  CHIEF COMPLAINT:  CC: History of multiple breast cancers  Current Treatment:  Monthly fulvestrant injections   HISTORY OF PRESENT ILLNESS:  [name] is a 87 y.o. female with a history of multiple breast cancers.  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  Pathology revealed a 0.7 cm lesion which was estrogen and progesterone receptor positive.  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  Estrogen and progesterone receptors were positive.  She received adjuvant radiation.  She was placed on tamoxifen, but stopped it on her own within a few months.  In December 2014, she was found to have a new lesion of the left breast found on routine mammogram and associated with retraction of the left nipple.  Initially, it measured 1.9 cm with an enlarged left axillary node, but slowly enlarged over the next few months, up to 3 cm.  Biopsy confirmed invasive mammary carcinoma with lobular features.  Estrogen and progesterone receptors were positive with her 2 neu negative.  Ki 67 was 34%.  CT scans were negative for metastatic disease.  She has many comorbidities including significant cardiac disease, so declined surgery.  She was placed on hormonal therapy with letrozole 2.5 mg daily in 2015.  She had a good response to this, with control of her disease.  Bilateral mammogram and left breast  ultrasound in February of 2017 showed a persistent node, which was down to 5-6 mm and the subareolar left breast calcifications were stable, with no evidence of definite mass.  She also had a bone density at that time, which continues to show osteopenia.  She stopped the letrozole in March of 2017, because of hot flashes, with improvement of this.  However, she had progressive disease while off  treatment with a recurrent central left breast mass.  She was then placed on fulvestrant injections in May of 2017, and has tolerated these quite well.  She has had a slow decrease in her mass since that time. When I examined her last year, I felt a nodular masslike effect in the epigastrium and so wanted to recheck this.  I brought her back later to recheck it, and it felt soft, like a lipoma.  She eventually had ultrasound imaging and this was found to be a small epigastric ventral hernia containing omental fat.  She had been having diarrhea and Dr. Melina Copa saw her in November but did not recommend colonoscopy because of her age and comorbidities.  She was having increased dyspnea with exertion and was occasionally feeling a tight feeling in her throat.  We referred her back to her cardiologist, Dr. Daneen Schick.  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  She is on liquid B12 supplement and her appetite is good.  She also had an ultrasound of her thyroid in February of 2019 to follow up  on some nodules, and the nodules were stable to decreased in size.  Bone density scan from June 2021 revealed osteoporosis with a T-score of -3.2 of the left forearm, previously -1.8.  The right femur measures -2.2, previously -1.8, and is considered osteopenic.  Dual femur total mean measures -1.7, previously -1.2.    INTERVAL HISTORY:  [name] is here for follow up after becoming quite ill with diarrhea and requiring hospitalization for 4 days at [facility].  She missed 1  fulvestrant injection during August.  She states that she still doesn't feel quite back to normal, but is on a new medication that is suppose to be successful.  She remains fatigued.  She mentioned that she is anxious to go to [place].  Hemoglobin is stable to mildly worse at 11.3, and white count and platelets are normal.  Chemistries are unremarkable except for a creatinine of 1.3, previously 1.1.  Her  appetite is better, and she has lost 10 pounds since April.  She denies fever, chills or other signs of infection.  She denies nausea, vomiting, or abdominal pain.  She denies sore throat, cough, dyspnea, or chest pain.  REVIEW OF SYSTEMS:  Review of Systems  Constitutional:  Positive for fatigue. Negative for appetite change, chills, fever and unexpected weight change.  HENT:  Negative.    Eyes: Negative.   Respiratory: Negative.  Negative for chest tightness, cough, hemoptysis, shortness of breath and wheezing.   Cardiovascular: Negative.  Negative for chest pain, leg swelling and palpitations.  Gastrointestinal: Negative.  Negative for abdominal distention, abdominal pain, blood in stool, constipation, diarrhea, nausea and vomiting.  Endocrine: Negative.   Genitourinary: Negative.  Negative for difficulty urinating, dysuria, frequency and hematuria.   Musculoskeletal:  Positive for gait problem (uses a walker for ambulation). Negative for arthralgias, back pain, flank pain and myalgias.  Skin: Negative.   Neurological:  Positive for gait problem (uses a walker for ambulation). Negative for dizziness, extremity weakness, headaches, light-headedness, numbness, seizures and speech difficulty.  Hematological: Negative.   Psychiatric/Behavioral: Negative.  Negative for depression and sleep disturbance. The patient is not nervous/anxious.     VITALS:  Blood pressure 132/70, pulse 69, temperature 98.2 F (36.8 C), temperature source Oral, resp. rate 18, height '4\' 11"'$  (1.499 m), weight 156 lb (70.8  kg), SpO2 96 %.  Wt Readings from Last 3 Encounters:  11/11/20 156 lb (70.8 kg)  10/21/20 158 lb 1.1 oz (71.7 kg)  09/26/20 165 lb 4 oz (75 kg)    Body mass index is 31.51 kg/m.  Performance status (ECOG): 2 - Symptomatic, <50% confined to bed  PHYSICAL EXAM:  Physical Exam Constitutional:      General: She is not in acute distress.    Appearance: Normal appearance. She is normal weight.  HENT:     Head: Normocephalic and atraumatic.  Eyes:     General: No scleral icterus.    Extraocular Movements: Extraocular movements intact.     Conjunctiva/sclera: Conjunctivae normal.     Pupils: Pupils are equal, round, and reactive to light.  Cardiovascular:     Rate and Rhythm: Normal rate and regular rhythm.     Pulses: Normal pulses.     Heart sounds: Normal heart sounds. No murmur heard.   No friction rub. No gallop.  Pulmonary:     Effort: Pulmonary effort is normal. No respiratory distress.     Breath sounds: Normal breath sounds.  Chest:     Comments: Mild firmness in the upper  outer quadrant of the left breast and mild firmness in the left axilla.  Right breast is without masses. Abdominal:     General: Bowel sounds are normal. There is no distension.     Palpations: Abdomen is soft. There is no hepatomegaly, splenomegaly or mass.     Tenderness: There is no abdominal tenderness.  Musculoskeletal:        General: Normal range of motion.     Cervical back: Normal range of motion and neck supple.     Right lower leg: Edema (mild) present.     Left lower leg: Edema (mild) present.  Lymphadenopathy:     Cervical: No cervical adenopathy.  Skin:    General: Skin is warm and dry.  Neurological:     General: No focal deficit present.     Mental Status: She is alert and oriented to person, place, and time. Mental status is at baseline.  Psychiatric:        Mood and Affect: Mood normal.        Behavior: Behavior normal.        Thought Content: Thought content normal.         Judgment: Judgment normal.    LABS:   CBC Latest Ref Rng & Units 11/11/2020 10/21/2020 10/20/2020  WBC - 8.8 9.8 7.3  Hemoglobin 12.0 - 16.0 11.3(A) 10.7(L) 10.5(L)  Hematocrit 36 - 46 33(A) 32.1(L) 32.1(L)  Platelets 150 - 399 269 263 241   CMP Latest Ref Rng & Units 11/11/2020 10/21/2020 10/20/2020  Glucose 70 - 99 mg/dL - 117(H) 114(H)  BUN 4 - 21 14 7(L) 9  Creatinine 0.5 - 1.1 1.3(A) 0.73 0.77  Sodium 137 - 147 137 135 134(L)  Potassium 3.4 - 5.3 3.9 3.7 2.9(L)  Chloride 99 - 108 105 105 104  CO2 13 - '22 20 23 '$ 21(L)  Calcium 8.7 - 10.7 9.2 8.9 8.8(L)  Total Protein 6.5 - 8.1 g/dL - - -  Total Bilirubin 0.3 - 1.2 mg/dL - - -  Alkaline Phos 25 - 125 55 - -  AST 13 - 35 25 - -  ALT 7 - 35 14 - -    STUDIES:  DG Abd 1 View  Result Date: 10/18/2020 CLINICAL DATA:  Diarrhea for several weeks EXAM: ABDOMEN - 1 VIEW COMPARISON:  None. FINDINGS: Scattered large and small bowel gas is noted. No obstructive changes are seen. Diffuse vascular calcifications and degenerative changes of lumbar spine are seen. No free air is noted. No abnormal mass or abnormal calcifications are seen. IMPRESSION: No acute abnormality in the abdomen. Electronically Signed   By: Inez Catalina M.D.   On: 10/18/2020 21:35   DG ESOPHAGUS W SINGLE CM (SOL OR THIN BA)  Result Date: 10/20/2020 CLINICAL DATA:  Dysphagia R13.10 (ICD-10-CM). Additional history provided: Patient reports 6 months of progressive dysphagia, feeling as though foods and liquids are becoming stuck in the region of the throat, weight loss. EXAM: ESOPHOGRAM/BARIUM SWALLOW TECHNIQUE: A single contrast examination was performed using barium contrast. FLUOROSCOPY TIME:  Fluoroscopy Time:  2 minutes, 30 seconds. Radiation Exposure Index (if provided by the fluoroscopic device): 33.3 mGy Number of Acquired Spot Images: 4 COMPARISON:  CT of the chest 07/25/2017. FINDINGS: The esophagus is mildly patulous and there is a paucity of esophageal peristalsis. Narrowed  appearance of the gastroesophageal junction. Significantly delayed, and intermittent, passage of contrast from the esophagus into the stomach. No gastroesophageal reflux identified. No appreciable hiatal hernia. Due to patient apprehension, a  13 mm barium tablet was not administered. IMPRESSION: Mildly patulous esophagus with a paucity of esophageal peristalsis. There is a narrowed appearance of the gastroesophageal junction and there is significantly delayed, and intermittent, passage of contrast from the esophagus into the stomach. Findings are suggestive of a stricture at the level of the distal esophagus/GE junction, and endoscopy is recommended for further evaluation. Electronically Signed   By: Kellie Simmering DO   On: 10/20/2020 12:26     Allergies:  Allergies  Allergen Reactions   Betadine [Povidone Iodine] Other (See Comments)    Blisters    Latex Hives and Swelling   Zestril [Lisinopril] Cough    Current Medications: Current Outpatient Medications  Medication Sig Dispense Refill   acetaminophen (TYLENOL) 500 MG tablet Take 1,000 mg by mouth daily as needed for mild pain or headache.     amLODipine (NORVASC) 2.5 MG tablet TAKE ONE (1) TABLET BY MOUTH ONCE DAILY 30 tablet 3   amoxicillin (AMOXIL) 500 MG capsule Take 1,000 mg by mouth once as needed (dental work).     Ascorbic Acid (VITAMIN C) 1000 MG tablet Take 1,000 mg by mouth daily.     atorvastatin (LIPITOR) 10 MG tablet Take 10 mg by mouth daily.     Azelastine-Fluticasone 137-50 MCG/ACT SUSP Place 1 spray into both nostrils 2 (two) times daily as needed (congestion).     buPROPion (WELLBUTRIN XL) 150 MG 24 hr tablet Take 150 mg by mouth every morning.     busPIRone (BUSPAR) 5 MG tablet Take 5 mg by mouth 3 (three) times daily as needed (anxiety).     cholecalciferol (VITAMIN D3) 25 MCG (1000 UT) tablet Take 1,000 Units by mouth daily.     cloNIDine (CATAPRES) 0.1 MG tablet Take 0.1 mg by mouth 2 (two) times daily.      diphenoxylate-atropine (LOMOTIL) 2.5-0.025 MG tablet Take 1 tablet by mouth 4 (four) times daily as needed for diarrhea or loose stools. 30 tablet 0   eszopiclone (LUNESTA) 1 MG TABS tablet Take 1-2 mg by mouth at bedtime.     fluticasone (FLONASE) 50 MCG/ACT nasal spray Place 2 sprays into both nostrils daily as needed for allergies or rhinitis.     fulvestrant (FASLODEX) 250 MG/5ML injection Inject into the muscle once. One injection each buttock over 1-2 minutes. Warm prior to use.     HYDROcodone-acetaminophen (NORCO/VICODIN) 5-325 MG tablet Take 1-2 tablets by mouth every 6 (six) hours as needed for moderate pain.     levothyroxine (SYNTHROID, LEVOTHROID) 50 MCG tablet Take 50 mcg by mouth daily before breakfast.      Magnesium Oxide 500 MG CAPS Take by mouth.     metoprolol succinate (TOPROL-XL) 25 MG 24 hr tablet Take 25 mg by mouth daily.      Misc Natural Products (NEURIVA PO) Take 1 capsule by mouth daily.     nystatin (MYCOSTATIN) 100000 UNIT/ML suspension Take 5 mLs (500,000 Units total) by mouth 4 (four) times daily. 60 mL 0   OVER THE COUNTER MEDICATION Take 2 tablets by mouth daily. Bladder Control 360     polycarbophil (FIBERCON) 625 MG tablet Take 625 mg by mouth daily.     potassium chloride SA (KLOR-CON) 20 MEQ tablet Take 1 tablet (20 mEq total) by mouth daily for 14 days. 14 tablet 0   Probiotic Product (ALIGN PO) Take 1 capsule by mouth daily.     RESTORA RX 60-1.25 MG CAPS Take 1 capsule by mouth daily.  No current facility-administered medications for this visit.     ASSESSMENT & PLAN:   Assessment:   1. Stage 0 breast cancer April 2009 of the left breast treated with lumpectomy.  2. Stage I invasive ductal carcinoma of the right breast in 2010 treated with surgery and adjuvant radiation.  3. Stage IIB left breast cancer diagnosed in December of 2014 with a 1.9 cm lesion and a 3 cm left axillary node for a T2 N1 M0.  Once again this was estrogen and progesterone  receptor positive but she had not taken hormonal therapy in the past.  She declined surgery because of her comorbidities and so was placed on hormonal therapy with letrozole in 2015 until March of 2017. At that time she stopped it because of severe hot flashes and was changed to fulvestrant injections monthly as of May of 2017. She has done very well with that.  Her left breast mass had started to grow back in 2017, but is back under control, and barely even palpable now.  4. Nodular mass effect in the epigastrium which is a small ventral hernia containing fat.  She underwent hernia repair in December 2020, but I can still feel a small nodule in the epigastrium on palpation.  5. Osteoporosis.  Bone density scan from June 2021 revealed severe worsening and she is now considered osteoporotic with a T-score of -3.2 of the left forearm.  The femur has also shown worsening but is still considered within the osteopenia range.   She will be due for repeat bone density scan in June 2023.  6. Status post TAVR 2019, and doing extremely well.   Plan: We will proceed with fulvestrant later this week.  I will see her back in 4 weeks with CBC and CMP for repeat examination prior to her next fulvestrant.  She understands and agrees with this plan of care.  She knows to call if she needs to be seen sooner.   I provided 20 minutes of face-to-face time during this this encounter and > 50% was spent counseling as documented under my assessment and plan.    Derwood Kaplan, MD [facility] AT [facility] [address] [place] [address] Dept: [phone] Dept Fax: [phone]   I, Rita Ohara, am acting as scribe for Derwood Kaplan, MD  I have reviewed this report as typed by the medical scribe, and it is complete and accurate.

## 2020-11-11 NOTE — Progress Notes (Addendum)
Cardiology Office Note    Date:  11/12/2020   ID:  [name], DOB 26-Dec-1925, MRN [id number]   PCP:  Ernestene Kiel, MD   [place]  Cardiologist:  [name], MD   Advanced Practice Provider:  No care team member to display Electrophysiologist:  None   [id number]   Chief Complaint  Patient presents with  . Follow-up     History of Present Illness:  [name] is a 88 y.o. female  with a hx of CAD s/p PCI to RCA in 2010, CKD III, HLD, HTN, hypothyroidism, breast cancer s/p lumpectomy and radiation, chronic diastolic CHF, and severe AS s/p TAVR (08/02/17).   Patient last saw Dr. Tamala Julian 05/2019 complaining of fatigue. Hospitalized earlier this month with microscopic  colitis and AKI due to fluid loss from diarrhea. Losartan and benicar discontinued as potential cause. Lipitor was continued but may also need to be stopped.  Patient comes in with her daughter. Doing better with colitis. Denies chest pain, palpitations, shortness of breath or dizziness. Crt 1.3 this week at PCP.Trying to stay hydrated.   Past Medical History:  Diagnosis Date  . Aortic stenosis, severe    a. 07/2017: s/p TAVR w/ an Edwards Sapien 3 THV (size 26 mm, model # [id number], serial # [id number])  . CAD (coronary artery disease)    a. 2010: s/p stent to RCA  . Chronic kidney disease, stage III (moderate) (HCC)   . Chronic sinusitis   . Depressive disorder   . History of breast cancer    a. s/p lumpectomies and XRT  . Hyperlipemia   . Hypertension   . Hypothyroid   . Insomnia   . Memory loss   . Osteoarthritis   . Reflux esophagitis     Past Surgical History:  Procedure Laterality Date  . ANGIOPLASTY    . BIOPSY  10/21/2020   Procedure: BIOPSY;  Surgeon: Carol Ada, MD;  Location: [facility];  Service: Endoscopy;;  . BREAST LUMPECTOMY     x2  . ESOPHAGOGASTRODUODENOSCOPY Left 10/21/2020   Procedure: ESOPHAGOGASTRODUODENOSCOPY  (EGD);  Surgeon: Carol Ada, MD;  Location: [facility];  Service: Endoscopy;  Laterality: Left;  Abnormal barium swallow  . FOOT TENDON SURGERY    . GALLBLADDER SURGERY    . INCISIONAL HERNIA REPAIR N/A 03/01/2019   Procedure: OPEN INCISIONAL HERNIA REPAIR;  Surgeon: Armandina Gemma, MD;  Location: [facility];  Service: General;  Laterality: N/A;  . INSERTION OF MESH N/A 03/01/2019   Procedure: INSERTION OF MESH;  Surgeon: Armandina Gemma, MD;  Location: [facility];  Service: General;  Laterality: N/A;  . INTRAOPERATIVE TRANSTHORACIC ECHOCARDIOGRAM N/A 08/02/2017   Procedure: INTRAOPERATIVE TRANSTHORACIC ECHOCARDIOGRAM;  Surgeon: Burnell Blanks, MD;  Location: [facility];  Service: Open Heart Surgery;  Laterality: N/A;  . REPLACEMENT TOTAL KNEE BILATERAL    . RIGHT/LEFT HEART CATH AND CORONARY ANGIOGRAPHY N/A 07/05/2017   Procedure: RIGHT/LEFT HEART CATH AND CORONARY ANGIOGRAPHY;  Surgeon: Belva Crome, MD;  Location: [facility];  Service: Cardiovascular;  Laterality: N/A;  . SKIN CANCER EXCISION  2018   right nostril   . TOTAL ABDOMINAL HYSTERECTOMY    . TRANSCATHETER AORTIC VALVE REPLACEMENT, TRANSFEMORAL N/A 08/02/2017   Procedure: TRANSCATHETER AORTIC VALVE REPLACEMENT, TRANSFEMORAL;  Surgeon: Burnell Blanks, MD;  Location: [facility];  Service: Open Heart Surgery;  Laterality: N/A;    Current Medications: Current Meds  Medication Sig  . acetaminophen (TYLENOL) 500  MG tablet Take 1,000 mg by mouth daily as needed for mild pain or headache.  Marland Kitchen acetaminophen-codeine (TYLENOL #3) 300-30 MG tablet Take 1 tablet by mouth 2 (two) times daily as needed.  Marland Kitchen amLODipine (NORVASC) 2.5 MG tablet TAKE ONE (1) TABLET BY MOUTH ONCE DAILY  . amoxicillin (AMOXIL) 500 MG capsule Take 1,000 mg by mouth once as needed (dental work).  . Ascorbic Acid (VITAMIN C) 1000 MG tablet Take 1,000 mg by mouth daily.  Marland Kitchen atorvastatin (LIPITOR) 10 MG tablet Take 10 mg by mouth daily.  . Azelastine-Fluticasone  137-50 MCG/ACT SUSP Place 1 spray into both nostrils 2 (two) times daily as needed (congestion).  Marland Kitchen buPROPion (WELLBUTRIN XL) 150 MG 24 hr tablet Take 150 mg by mouth every morning.  . busPIRone (BUSPAR) 5 MG tablet Take 5 mg by mouth 3 (three) times daily as needed (anxiety).  . cholecalciferol (VITAMIN D3) 25 MCG (1000 UT) tablet Take 1,000 Units by mouth daily.  . cloNIDine (CATAPRES) 0.1 MG tablet Take 0.1 mg by mouth 2 (two) times daily.  . diphenoxylate-atropine (LOMOTIL) 2.5-0.025 MG tablet Take 1 tablet by mouth 4 (four) times daily as needed for diarrhea or loose stools.  . eszopiclone (LUNESTA) 1 MG TABS tablet Take 1-2 mg by mouth at bedtime.  . fluticasone (FLONASE) 50 MCG/ACT nasal spray Place 2 sprays into both nostrils daily as needed for allergies or rhinitis.  . fulvestrant (FASLODEX) 250 MG/5ML injection Inject into the muscle once. One injection each buttock over 1-2 minutes. Warm prior to use.  Marland Kitchen HYDROcodone-acetaminophen (NORCO/VICODIN) 5-325 MG tablet Take 1-2 tablets by mouth every 6 (six) hours as needed for moderate pain.  Marland Kitchen levothyroxine (SYNTHROID, LEVOTHROID) 50 MCG tablet Take 50 mcg by mouth daily before breakfast.   . Magnesium Oxide 500 MG CAPS Take by mouth.  . metoprolol succinate (TOPROL-XL) 25 MG 24 hr tablet Take 25 mg by mouth daily.   . Misc Natural Products (NEURIVA PO) Take 1 capsule by mouth daily.  Marland Kitchen nystatin (MYCOSTATIN) 100000 UNIT/ML suspension Take 5 mLs (500,000 Units total) by mouth 4 (four) times daily.  Marland Kitchen OVER THE COUNTER MEDICATION Take 2 tablets by mouth daily. Bladder Control 360  . polycarbophil (FIBERCON) 625 MG tablet Take 625 mg by mouth daily.  . Probiotic Product (ALIGN PO) Take 1 capsule by mouth daily.  Mathis Bud RX 60-1.25 MG CAPS Take 1 capsule by mouth daily.     Allergies:   Betadine [povidone iodine], Latex, and Zestril [lisinopril]   Social History   Socioeconomic History  . Marital status: Widowed    Spouse name: Not on  file  . Number of children: 3  . Years of education: Not on file  . Highest education level: Not on file  Occupational History  . 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 Smoking status: Former    Packs/day: 0.50    Years: 20.00    Pack years: 10.00    Types: Cigarettes    Quit date: 03/15/1964    Years since quitting: 56.7  . Smokeless tobacco: Never  Vaping Use  . Vaping Use: Never used  Substance and Sexual Activity  . Alcohol use: Yes    Comment: social  . Drug use: No  . Sexual activity: Not on file  Other Topics Concern  . Not on file  Social History Narrative  . Not on file   Social Determinants of Health   Financial Resource Strain: Not on file  Food  Insecurity: Not on file  Transportation Needs: Not on file  Physical Activity: Not on file  Stress: Not on file  Social Connections: Not on file     Family History:  The patient's  family history includes Breast cancer in her sister; Heart disease in her father and maternal grandfather; Stroke in her mother.   ROS:   Please see the history of present illness.    ROS All other systems reviewed and are negative.   PHYSICAL EXAM:   VS:  BP 118/75   Pulse 71   Ht '4\' 11"'$  (1.499 m)   Wt 157 lb (71.2 kg)   SpO2 96%   BMI 31.71 kg/m   Physical Exam  GEN: Well nourished, well developed, in no acute distress  Neck: no JVD, carotid bruits, or masses Cardiac:RRR; 1/6 systolic murmur the left sternal border Respiratory:  clear to auscultation bilaterally, normal work of breathing GI: soft, nontender, nondistended, + BS Ext: Trace of ankle edema bilaterally without cyanosis, clubbing,Good distal pulses bilaterally Neuro:  Alert and Oriented x 3 Psych: euthymic mood, full affect  Wt Readings from Last 3 Encounters:  11/12/20 157 lb (71.2 kg)  11/11/20 156 lb (70.8 kg)  10/21/20 158 lb 1.1 oz (71.7 kg)      Studies/Labs Reviewed:   EKG:  EKG is not ordered today.    Recent  Labs: 10/21/2020: Magnesium 1.7 11/11/2020: ALT 14; BUN 14; Creatinine 1.3; Hemoglobin 11.3; Platelets 269; Potassium 3.9; Sodium 137   Lipid Panel No results found for: CHOL, TRIG, HDL, CHOLHDL, VLDL, LDLCALC, LDLDIRECT  Additional studies/ records that were reviewed today include:  2D Doppler echocardiogram 10/26/2018: IMPRESSIONS     1. The left ventricle has low normal systolic function, with an ejection  fraction of 50-55%. The cavity size was normal. There is moderate  asymmetric left ventricular hypertrophy. Left ventricular diastolic  Doppler parameters are consistent with  pseudonormalization. Elevated left ventricular end-diastolic pressure.   2. The right ventricle has normal systolic function. The cavity was  normal. There is no increase in right ventricular wall thickness.   3. Mild thickening of the mitral valve leaflet. Mild calcification of the  mitral valve leaflet. There is moderate mitral annular calcification  present.   4. The aorta is normal unless otherwise noted.   5. - TAVR: Post TAVR with 26 mm Sapien 3 no PVL and similar gradients to  June 2019 mean 8 to 9 mmHg peak 14 to 18 mmHg.        Risk Assessment/Calculations:         ASSESSMENT:    1. Coronary artery disease involving native coronary artery of native heart without angina pectoris   2. S/P TAVR (transcatheter aortic valve replacement)   3. Secondary hypertension   4. Chronic diastolic CHF (congestive heart failure) (HCC)   5. Stage 3 chronic kidney disease, unspecified whether stage 3a or 3b CKD (HCC)      PLAN:  In order of problems listed above:  CAD status post PCI to the RCA in 2010 doing well without angina-hasn't been on ASA for over 2 yrs. I can't see where anyone stopped it but patient not interested in taking. Address with Dr. Tamala Julian given her age and with TAVR and CAD.  Continue amlodipine Lipitor metoprolol  Status post TAVR for severe AS 08/02/2017 last echo 10/26/2018 normal  LVEF, mean gradient 14 to 18 mmHg no angina or dyspnea.  Getting along pretty well.  Essential hypertension blood pressure stable off Benicar  and losartan.  Continue to monitor  Chronic diastolic CHF mild ankle edema chronic  CKD stage III had AKI in the hospital with colitis but creatinine normal at discharge daughter says it was 1.3 at PCP this week.  She is trying to stay hydrated.  Shared Decision Making/Informed Consent        Medication Adjustments/Labs and Tests Ordered: Current medicines are reviewed at length with the patient today.  Concerns regarding medicines are outlined above.  Medication changes, Labs and Tests ordered today are listed in the Patient Instructions below. Patient Instructions  Medication Instructions:  Your physician recommends that you continue on your current medications as directed. Please refer to the Current Medication list given to you today.  *If you need a refill on your cardiac medications before your next appointment, please call your pharmacy*   Lab Work: NONE If you have labs (blood work) drawn today and your tests are completely normal, you will receive your results only by: [place] (if you have MyChart) OR A paper copy in the mail If you have any lab test that is abnormal or we need to change your treatment, we will call you to review the results.   Testing/Procedures: NONE   Follow-Up: At [facility], you and your health needs are our priority.  As part of our continuing mission to provide you with exceptional heart care, we have created designated Provider Care Teams.  These Care Teams include your primary Cardiologist (physician) and Advanced Practice Providers (APPs -  Physician Assistants and Nurse Practitioners) who all work together to provide you with the care you need, when you need it.  We recommend signing up for the patient portal called "MyChart".  Sign up information is provided on this After Visit Summary.   MyChart is used to connect with patients for Virtual Visits (Telemedicine).  Patients are able to view lab/test results, encounter notes, upcoming appointments, etc.  Non-urgent messages can be sent to your provider as well.   To learn more about what you can do with MyChart, go to [email or web address].    Your next appointment:   6 month(s)  The format for your next appointment:   In Person  Provider:   You may see [name], MD or one of the following Advanced Practice Providers on your designated Care Team:   Cecilie Kicks, NP     Signed, Ermalinda Barrios, PA-C  11/12/2020 2:47 PM    [address], [place], [place]  [address] Phone: [phone]; Fax: [phone]

## 2020-11-12 ENCOUNTER — Ambulatory Visit (INDEPENDENT_AMBULATORY_CARE_PROVIDER_SITE_OTHER): Payer: Medicare Other | Admitting: Physician Assistant

## 2020-11-12 ENCOUNTER — Encounter: Payer: Self-pay | Admitting: Physician Assistant

## 2020-11-12 ENCOUNTER — Other Ambulatory Visit: Payer: Self-pay

## 2020-11-12 ENCOUNTER — Other Ambulatory Visit: Payer: Self-pay | Admitting: Pharmacist

## 2020-11-12 VITALS — BP 118/75 | HR 71 | Ht 59.0 in | Wt 157.0 lb

## 2020-11-12 DIAGNOSIS — I159 Secondary hypertension, unspecified: Secondary | ICD-10-CM | POA: Diagnosis not present

## 2020-11-12 DIAGNOSIS — Z952 Presence of prosthetic heart valve: Secondary | ICD-10-CM

## 2020-11-12 DIAGNOSIS — N183 Chronic kidney disease, stage 3 unspecified: Secondary | ICD-10-CM

## 2020-11-12 DIAGNOSIS — I5032 Chronic diastolic (congestive) heart failure: Secondary | ICD-10-CM | POA: Diagnosis not present

## 2020-11-12 DIAGNOSIS — I2510 Atherosclerotic heart disease of native coronary artery without angina pectoris: Secondary | ICD-10-CM

## 2020-11-12 NOTE — Patient Instructions (Signed)
Medication Instructions:  Your physician recommends that you continue on your current medications as directed. Please refer to the Current Medication list given to you today.  *If you need a refill on your cardiac medications before your next appointment, please call your pharmacy*   Lab Work: NONE If you have labs (blood work) drawn today and your tests are completely normal, you will receive your results only by: [place] (if you have MyChart) OR A paper copy in the mail If you have any lab test that is abnormal or we need to change your treatment, we will call you to review the results.   Testing/Procedures: NONE   Follow-Up: At [facility], you and your health needs are our priority.  As part of our continuing mission to provide you with exceptional heart care, we have created designated Provider Care Teams.  These Care Teams include your primary Cardiologist (physician) and Advanced Practice Providers (APPs -  Physician Assistants and Nurse Practitioners) who all work together to provide you with the care you need, when you need it.  We recommend signing up for the patient portal called "MyChart".  Sign up information is provided on this After Visit Summary.  MyChart is used to connect with patients for Virtual Visits (Telemedicine).  Patients are able to view lab/test results, encounter notes, upcoming appointments, etc.  Non-urgent messages can be sent to your provider as well.   To learn more about what you can do with MyChart, go to [email or web address].    Your next appointment:   6 month(s)  The format for your next appointment:   In Person  Provider:   You may see [name], MD or one of the following Advanced Practice Providers on your designated Care Team:   Cecilie Kicks, NP

## 2020-11-13 ENCOUNTER — Encounter: Payer: Self-pay | Admitting: Oncology

## 2020-11-13 ENCOUNTER — Ambulatory Visit: Payer: Medicare Other

## 2020-11-13 ENCOUNTER — Inpatient Hospital Stay: Payer: Medicare Other | Attending: Oncology

## 2020-11-13 VITALS — BP 127/75 | HR 77 | Temp 98.8°F | Resp 18 | Ht 59.0 in | Wt 157.5 lb

## 2020-11-13 DIAGNOSIS — C50112 Malignant neoplasm of central portion of left female breast: Secondary | ICD-10-CM | POA: Diagnosis not present

## 2020-11-13 DIAGNOSIS — Z79818 Long term (current) use of other agents affecting estrogen receptors and estrogen levels: Secondary | ICD-10-CM | POA: Insufficient documentation

## 2020-11-13 DIAGNOSIS — Z170 Estrogen receptor positive status [ER+]: Secondary | ICD-10-CM | POA: Diagnosis not present

## 2020-11-13 MED ORDER — FULVESTRANT 250 MG/5ML IM SOLN
500.0000 mg | Freq: Once | INTRAMUSCULAR | Status: AC
  Administered 2020-11-13: 500 mg via INTRAMUSCULAR
  Filled 2020-11-13: qty 10

## 2020-11-13 NOTE — Patient Instructions (Signed)
Fulvestrant injection What is this medication? FULVESTRANT (ful VES trant) blocks the effects of estrogen. It is used to treat breast cancer. This medicine may be used for other purposes; ask your health care provider or pharmacist if you have questions. COMMON BRAND NAME(S): FASLODEX What should I tell my care team before I take this medication? They need to know if you have any of these conditions: bleeding disorders liver disease low blood counts, like low white cell, platelet, or red cell counts an unusual or allergic reaction to fulvestrant, other medicines, foods, dyes, or preservatives pregnant or trying to get pregnant breast-feeding How should I use this medication? This medicine is for injection into a muscle. It is usually given by a health care professional in a hospital or clinic setting. Talk to your pediatrician regarding the use of this medicine in children. Special care may be needed. Overdosage: If you think you have taken too much of this medicine contact a poison control center or emergency room at once. NOTE: This medicine is only for you. Do not share this medicine with others. What if I miss a dose? It is important not to miss your dose. Call your doctor or health care professional if you are unable to keep an appointment. What may interact with this medication? medicines that treat or prevent blood clots like warfarin, enoxaparin, dalteparin, apixaban, dabigatran, and rivaroxaban This list may not describe all possible interactions. Give your health care provider a list of all the medicines, herbs, non-prescription drugs, or dietary supplements you use. Also tell them if you smoke, drink alcohol, or use illegal drugs. Some items may interact with your medicine. What should I watch for while using this medication? Your condition will be monitored carefully while you are receiving this medicine. You will need important blood work done while you are taking this  medicine. Do not become pregnant while taking this medicine or for at least 1 year after stopping it. Women of child-bearing potential will need to have a negative pregnancy test before starting this medicine. Women should inform their doctor if they wish to become pregnant or think they might be pregnant. There is a potential for serious side effects to an unborn child. Men should inform their doctors if they wish to father a child. This medicine may lower sperm counts. Talk to your health care professional or pharmacist for more information. Do not breast-feed an infant while taking this medicine or for 1 year after the last dose. What side effects may I notice from receiving this medication? Side effects that you should report to your doctor or health care professional as soon as possible: allergic reactions like skin rash, itching or hives, swelling of the face, lips, or tongue feeling faint or lightheaded, falls pain, tingling, numbness, or weakness in the legs signs and symptoms of infection like fever or chills; cough; flu-like symptoms; sore throat vaginal bleeding Side effects that usually do not require medical attention (report to your doctor or health care professional if they continue or are bothersome): aches, pains constipation diarrhea headache hot flashes nausea, vomiting pain at site where injected stomach pain This list may not describe all possible side effects. Call your doctor for medical advice about side effects. You may report side effects to FDA at 1-800-FDA-1088. Where should I keep my medication? This drug is given in a hospital or clinic and will not be stored at home. NOTE: This sheet is a summary. It may not cover all possible information. If you have   questions about this medicine, talk to your doctor, pharmacist, or health care provider.  2022 Elsevier/Gold Standard (2017-06-09 11:34:41)  

## 2020-11-14 ENCOUNTER — Encounter: Payer: Self-pay | Admitting: Oncology

## 2020-12-04 NOTE — Progress Notes (Signed)
[facility]  [address] [place],  [place]  [address] [phone]  Clinic Day:  12/11/2020  Referring physician: Ernestene Kiel, MD   This document serves as a record of services personally performed by [name], MD. It was created on their behalf by [name],[name], a trained medical scribe. The creation of this record is based on the scribe's personal observations and the provider's statements to them.  CHIEF COMPLAINT:  CC: History of multiple breast cancers  Current Treatment:  Monthly fulvestrant injections   HISTORY OF PRESENT ILLNESS:  [name] is a 87 y.o. female with a history of multiple breast cancers.  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  Pathology revealed a 0.7 cm lesion which was estrogen and progesterone receptor positive.  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  Estrogen and progesterone receptors were positive.  She received adjuvant radiation.  She was placed on tamoxifen, but stopped it on her own within a few months.  In December 2014, she was found to have a new lesion of the left breast found on routine mammogram and associated with retraction of the left nipple.  Initially, it measured 1.9 cm with an enlarged left axillary node, but slowly enlarged over the next few months, up to 3 cm.  Biopsy confirmed invasive mammary carcinoma with lobular features.  Estrogen and progesterone receptors were positive with her 2 neu negative.  Ki 67 was 34%.  CT scans were negative for metastatic disease.  She has many comorbidities including significant cardiac disease, so declined surgery.  She was placed on hormonal therapy with letrozole 2.5 mg daily in 2015.  She had a good response to this, with control of her disease.  Bilateral mammogram and left breast  ultrasound in February of 2017 showed a persistent node, which was down to 5-6 mm and the subareolar left breast calcifications were stable, with no evidence of definite mass.  She also had a bone density at that time, which continues to show osteopenia.  She stopped the letrozole in March of 2017, because of hot flashes, with improvement of this.  However, she had progressive disease while off  treatment with a recurrent central left breast mass.  She was then placed on fulvestrant injections in May of 2017, and has tolerated these quite well.  She has had a slow decrease in her mass since that time. When I examined her last year, I felt a nodular masslike effect in the epigastrium and so wanted to recheck this.  I brought her back later to recheck it, and it felt soft, like a lipoma.  She eventually had ultrasound imaging and this was found to be a small epigastric ventral hernia containing omental fat.  She had been having diarrhea and Dr. Melina Copa saw her in November but did not recommend colonoscopy because of her age and comorbidities.  She was having increased dyspnea with exertion and was occasionally feeling a tight feeling in her throat.  We referred her back to her cardiologist, Dr. Daneen Schick.  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  She is on liquid B12 supplement and her appetite is good.  She also had an ultrasound of her thyroid in February of 2019 to follow up  on some nodules, and the nodules were stable to decreased in size.  Bone density scan from June 2021 revealed osteoporosis with a T-score of -3.2 of the left forearm, previously -1.8.  The right femur measures -2.2, previously -1.8, and is considered osteopenic.  Dual femur total mean measures -1.7, previously -1.2.    She has been quite ill with diarrhea and requiring hospitalization for 4 days in August 2022 at [facility].  She missed 1 fulvestrant injection during August  and again in September. She is struggling to keep her appointments and keep up with her medications but does have help at home.    INTERVAL HISTORY:  [name] is here for routine follow up prior to her next fulvestrant. She states that she has not received an injection this month. She states that she has felt poorly for the past 2 months due to persistent diarrhea, up to 2-3 times per day and sometimes more.  She has had this problem in the past.  She has been evaluated but no etiology has been found, and she has been referred to GI in [place]. She was placed on a new medication yesterday in hopes to improve this. She has also been taking fiber. She does note some fatigue. Hemoglobin is stable at 11.4, and white count and platelets are normal. Chemistries are unremarkable except for a potassium of 3.4, secondary to diarrhea. Her creatinine has improved from 1.3 to 1.0. I gave her a list of potassium rich foods to include in her diet. Her  appetite is good, and she has gained 1 1/2 pounds since her last visit.  She denies fever, chills or other signs of infection.  She denies nausea, vomiting or abdominal pain.  She denies sore throat, cough, dyspnea, or chest pain.  REVIEW OF SYSTEMS:  Review of Systems  Constitutional:  Positive for fatigue. Negative for appetite change, chills, fever and unexpected weight change.  HENT:  Negative.    Eyes: Negative.   Respiratory: Negative.  Negative for chest tightness, cough, hemoptysis, shortness of breath and wheezing.   Cardiovascular: Negative.  Negative for chest pain, leg swelling and palpitations.  Gastrointestinal:  Positive for diarrhea (persistent for the past 2-3 months). Negative for abdominal distention, abdominal pain, blood in stool, constipation, nausea and vomiting.  Endocrine: Negative.   Genitourinary: Negative.  Negative for difficulty urinating, dysuria, frequency and hematuria.   Musculoskeletal: Negative.  Negative for arthralgias, back  pain, flank pain, gait problem and myalgias.  Skin: Negative.   Neurological: Negative.  Negative for dizziness, extremity weakness, gait problem, headaches, light-headedness, numbness, seizures and speech difficulty.  Hematological: Negative.   Psychiatric/Behavioral: Negative.  Negative for depression and sleep disturbance. The patient is not nervous/anxious.     VITALS:  Blood pressure 140/72, pulse (!) 53, temperature 97.7 F (36.5 C), temperature source Oral, resp. rate 18, height 4\' 11"  (1.499 m), weight 157 lb 8 oz (71.4 kg), SpO2 94 %.  Wt Readings from Last 3 Encounters:  12/11/20 157 lb 8 oz (71.4 kg)  11/13/20 157 lb 8 oz (71.4 kg)  11/12/20 157 lb (71.2 kg)    Body mass index is 31.81 kg/m.  Performance status (ECOG): 2 - Symptomatic, <50% confined to bed  PHYSICAL EXAM:  Physical Exam Constitutional:      General: She is not in acute distress.    Appearance: Normal appearance. She is normal weight.  HENT:     Head: Normocephalic and atraumatic.  Eyes:     General: No scleral  icterus.    Extraocular Movements: Extraocular movements intact.     Conjunctiva/sclera: Conjunctivae normal.     Pupils: Pupils are equal, round, and reactive to light.  Cardiovascular:     Rate and Rhythm: Regular rhythm. Bradycardia present.     Pulses: Normal pulses.     Heart sounds: Normal heart sounds. No murmur heard.   No friction rub. No gallop.  Pulmonary:     Effort: Pulmonary effort is normal. No respiratory distress.     Breath sounds: Normal breath sounds.  Abdominal:     General: Bowel sounds are normal. There is no distension.     Palpations: Abdomen is soft. There is no hepatomegaly, splenomegaly or mass.     Tenderness: There is no abdominal tenderness.  Musculoskeletal:        General: Normal range of motion.     Cervical back: Normal range of motion and neck supple.     Right lower leg: Edema (mild) present.     Left lower leg: Edema (mild) present.   Lymphadenopathy:     Cervical: No cervical adenopathy.  Skin:    General: Skin is warm and dry.  Neurological:     General: No focal deficit present.     Mental Status: She is alert and oriented to person, place, and time. Mental status is at baseline.  Psychiatric:        Mood and Affect: Mood normal.        Behavior: Behavior normal.        Thought Content: Thought content normal.        Judgment: Judgment normal.    LABS:   CBC Latest Ref Rng & Units 12/11/2020 11/11/2020 10/21/2020  WBC - 5.8 8.8 9.8  Hemoglobin 12.0 - 16.0 11.4(A) 11.3(A) 10.7(L)  Hematocrit 36 - 46 36 33(A) 32.1(L)  Platelets 150 - 399 214 269 263   CMP Latest Ref Rng & Units 12/11/2020 11/11/2020 10/21/2020  Glucose 70 - 99 mg/dL - - 117(H)  BUN 4 - 21 8 14  7(L)  Creatinine 0.5 - 1.1 1.0 1.3(A) 0.73  Sodium 137 - 147 138 137 135  Potassium 3.4 - 5.3 3.4 3.9 3.7  Chloride 99 - 108 106 105 105  CO2 13 - 22 24(A) 20 23  Calcium 8.7 - 10.7 9.1 9.2 8.9  Total Protein 6.5 - 8.1 g/dL - - -  Total Bilirubin 0.3 - 1.2 mg/dL - - -  Alkaline Phos 25 - 125 50 55 -  AST 13 - 35 22 25 -  ALT 7 - 35 11 14 -    STUDIES:  No results found.   Allergies:  Allergies  Allergen Reactions   Betadine [Povidone Iodine] Other (See Comments)    Blisters    Latex Hives and Swelling   Zestril [Lisinopril] Cough    Current Medications: Current Outpatient Medications  Medication Sig Dispense Refill   acetaminophen (TYLENOL) 500 MG tablet Take 1,000 mg by mouth daily as needed for mild pain or headache.     acetaminophen-codeine (TYLENOL #3) 300-30 MG tablet Take 1 tablet by mouth 2 (two) times daily as needed.     amLODipine (NORVASC) 2.5 MG tablet TAKE ONE (1) TABLET BY MOUTH ONCE DAILY 30 tablet 3   amoxicillin (AMOXIL) 500 MG capsule Take 1,000 mg by mouth once as needed (dental work).     Ascorbic Acid (VITAMIN C) 1000 MG tablet Take 1,000 mg by mouth daily.     atorvastatin (LIPITOR) 10 MG  tablet Take 10 mg by mouth  daily.     Azelastine-Fluticasone 137-50 MCG/ACT SUSP Place 1 spray into both nostrils 2 (two) times daily as needed (congestion).     buPROPion (WELLBUTRIN XL) 150 MG 24 hr tablet Take 150 mg by mouth every morning.     busPIRone (BUSPAR) 5 MG tablet Take 5 mg by mouth 3 (three) times daily as needed (anxiety).     cholecalciferol (VITAMIN D3) 25 MCG (1000 UT) tablet Take 1,000 Units by mouth daily.     cloNIDine (CATAPRES) 0.1 MG tablet Take 0.1 mg by mouth 2 (two) times daily.     diphenoxylate-atropine (LOMOTIL) 2.5-0.025 MG tablet Take 1 tablet by mouth 4 (four) times daily as needed for diarrhea or loose stools. 30 tablet 0   eszopiclone (LUNESTA) 1 MG TABS tablet Take 1-2 mg by mouth at bedtime.     fluticasone (FLONASE) 50 MCG/ACT nasal spray Place 2 sprays into both nostrils daily as needed for allergies or rhinitis.     fulvestrant (FASLODEX) 250 MG/5ML injection Inject into the muscle once. One injection each buttock over 1-2 minutes. Warm prior to use.     HYDROcodone-acetaminophen (NORCO/VICODIN) 5-325 MG tablet Take 1-2 tablets by mouth every 6 (six) hours as needed for moderate pain.     levothyroxine (SYNTHROID, LEVOTHROID) 50 MCG tablet Take 50 mcg by mouth daily before breakfast.      Magnesium Oxide 500 MG CAPS Take by mouth.     metoprolol succinate (TOPROL-XL) 25 MG 24 hr tablet Take 25 mg by mouth daily.      Misc Natural Products (NEURIVA PO) Take 1 capsule by mouth daily.     nystatin (MYCOSTATIN) 100000 UNIT/ML suspension Take 5 mLs (500,000 Units total) by mouth 4 (four) times daily. 60 mL 0   OVER THE COUNTER MEDICATION Take 2 tablets by mouth daily. Bladder Control 360     polycarbophil (FIBERCON) 625 MG tablet Take 625 mg by mouth daily.     Probiotic Product (ALIGN PO) Take 1 capsule by mouth daily.     RESTORA RX 60-1.25 MG CAPS Take 1 capsule by mouth daily.     No current facility-administered medications for this visit.     ASSESSMENT & PLAN:   Assessment:    1. Stage 0 breast cancer April 2009 of the left breast treated with lumpectomy.  2. Stage I invasive ductal carcinoma of the right breast in 2010 treated with surgery and adjuvant radiation.  3. Stage IIB left breast cancer diagnosed in December of 2014 with a 1.9 cm lesion and a 3 cm left axillary node for a T2 N1 M0.  Once again this was estrogen and progesterone receptor positive but she had not taken hormonal therapy in the past.  She declined surgery because of her comorbidities and so was placed on hormonal therapy with letrozole in 2015 until March of 2017. At that time she stopped it because of severe hot flashes and was changed to fulvestrant injections monthly as of May of 2017. She has done very well with that.  Her left breast mass had started to grow back in 2017, but is back under control, and barely even palpable now. However, she has missed some doses this summer.  4. Nodular mass effect in the epigastrium which is a small ventral hernia containing fat.  She underwent hernia repair in December 2020, but I can still feel a small nodule in the epigastrium on palpation.  5. Osteoporosis.  Bone density scan  from June 2021 revealed severe worsening and she is now considered osteoporotic with a T-score of -3.2 of the left forearm.  The femur has also shown worsening but is still considered within the osteopenia range.   She will be due for repeat bone density scan in June 2023.  6. Status post TAVR 2019, and doing extremely well.   7. Persistent diarrhea for the past 2-3 months, requiring hospitalization in August. She has been referred to GI in [place]. She is already on fiber.  8. Hypokalemia, mild, secondary to diarrhea. I gave her a list of foods to include in her diet.  Plan: We will proceed with fulvestrant today.  I will see her back in 4 weeks with CBC and CMP for repeat examination prior to her next fulvestrant.  She understands and agrees with this plan of care.  She knows  to call if she needs to be seen sooner.   I provided 20 minutes of face-to-face time during this this encounter and > 50% was spent counseling as documented under my assessment and plan.    Derwood Kaplan, MD [facility] [address] [place] [address] Dept: [phone] Dept Fax: [phone]   I, Rita Ohara, am acting as scribe for Derwood Kaplan, MD  I have reviewed this report as typed by the medical scribe, and it is complete and accurate.

## 2020-12-11 ENCOUNTER — Other Ambulatory Visit: Payer: Self-pay | Admitting: Oncology

## 2020-12-11 ENCOUNTER — Inpatient Hospital Stay: Payer: Medicare Other

## 2020-12-11 ENCOUNTER — Other Ambulatory Visit: Payer: Self-pay | Admitting: Hematology and Oncology

## 2020-12-11 ENCOUNTER — Other Ambulatory Visit: Payer: Self-pay

## 2020-12-11 ENCOUNTER — Inpatient Hospital Stay (INDEPENDENT_AMBULATORY_CARE_PROVIDER_SITE_OTHER): Payer: Medicare Other | Admitting: Oncology

## 2020-12-11 ENCOUNTER — Encounter: Payer: Self-pay | Admitting: Oncology

## 2020-12-11 VITALS — BP 140/72 | HR 53 | Temp 97.7°F | Resp 18 | Ht 59.0 in | Wt 157.5 lb

## 2020-12-11 DIAGNOSIS — C50112 Malignant neoplasm of central portion of left female breast: Secondary | ICD-10-CM

## 2020-12-11 DIAGNOSIS — Z170 Estrogen receptor positive status [ER+]: Secondary | ICD-10-CM

## 2020-12-11 DIAGNOSIS — C50411 Malignant neoplasm of upper-outer quadrant of right female breast: Secondary | ICD-10-CM

## 2020-12-11 DIAGNOSIS — D0512 Intraductal carcinoma in situ of left breast: Secondary | ICD-10-CM | POA: Diagnosis not present

## 2020-12-11 LAB — BASIC METABOLIC PANEL
BUN: 8 (ref 4–21)
CO2: 24 — AB (ref 13–22)
Chloride: 106 (ref 99–108)
Creatinine: 1 (ref 0.5–1.1)
Glucose: 100
Potassium: 3.4 (ref 3.4–5.3)
Sodium: 138 (ref 137–147)

## 2020-12-11 LAB — CBC AND DIFFERENTIAL
HCT: 36 (ref 36–46)
Hemoglobin: 11.4 — AB (ref 12.0–16.0)
Neutrophils Absolute: 4.29
Platelets: 214 (ref 150–399)
WBC: 5.8

## 2020-12-11 LAB — HEPATIC FUNCTION PANEL
ALT: 11 (ref 7–35)
AST: 22 (ref 13–35)
Alkaline Phosphatase: 50 (ref 25–125)
Bilirubin, Total: 0.8

## 2020-12-11 LAB — COMPREHENSIVE METABOLIC PANEL
Albumin: 3.9 (ref 3.5–5.0)
Calcium: 9.1 (ref 8.7–10.7)

## 2020-12-11 LAB — CBC
MCV: 91 (ref 81–99)
RBC: 3.93 (ref 3.87–5.11)

## 2020-12-11 MED ORDER — FULVESTRANT 250 MG/5ML IM SOLN
500.0000 mg | Freq: Once | INTRAMUSCULAR | Status: AC
  Administered 2020-12-11: 500 mg via INTRAMUSCULAR
  Filled 2020-12-11: qty 10

## 2020-12-11 NOTE — Patient Instructions (Signed)
Fulvestrant injection What is this medication? FULVESTRANT (ful VES trant) blocks the effects of estrogen. It is used to treat breast cancer. This medicine may be used for other purposes; ask your health care provider or pharmacist if you have questions. COMMON BRAND NAME(S): FASLODEX What should I tell my care team before I take this medication? They need to know if you have any of these conditions: bleeding disorders liver disease low blood counts, like low white cell, platelet, or red cell counts an unusual or allergic reaction to fulvestrant, other medicines, foods, dyes, or preservatives pregnant or trying to get pregnant breast-feeding How should I use this medication? This medicine is for injection into a muscle. It is usually given by a health care professional in a hospital or clinic setting. Talk to your pediatrician regarding the use of this medicine in children. Special care may be needed. Overdosage: If you think you have taken too much of this medicine contact a poison control center or emergency room at once. NOTE: This medicine is only for you. Do not share this medicine with others. What if I miss a dose? It is important not to miss your dose. Call your doctor or health care professional if you are unable to keep an appointment. What may interact with this medication? medicines that treat or prevent blood clots like warfarin, enoxaparin, dalteparin, apixaban, dabigatran, and rivaroxaban This list may not describe all possible interactions. Give your health care provider a list of all the medicines, herbs, non-prescription drugs, or dietary supplements you use. Also tell them if you smoke, drink alcohol, or use illegal drugs. Some items may interact with your medicine. What should I watch for while using this medication? Your condition will be monitored carefully while you are receiving this medicine. You will need important blood work done while you are taking this  medicine. Do not become pregnant while taking this medicine or for at least 1 year after stopping it. Women of child-bearing potential will need to have a negative pregnancy test before starting this medicine. Women should inform their doctor if they wish to become pregnant or think they might be pregnant. There is a potential for serious side effects to an unborn child. Men should inform their doctors if they wish to father a child. This medicine may lower sperm counts. Talk to your health care professional or pharmacist for more information. Do not breast-feed an infant while taking this medicine or for 1 year after the last dose. What side effects may I notice from receiving this medication? Side effects that you should report to your doctor or health care professional as soon as possible: allergic reactions like skin rash, itching or hives, swelling of the face, lips, or tongue feeling faint or lightheaded, falls pain, tingling, numbness, or weakness in the legs signs and symptoms of infection like fever or chills; cough; flu-like symptoms; sore throat vaginal bleeding Side effects that usually do not require medical attention (report to your doctor or health care professional if they continue or are bothersome): aches, pains constipation diarrhea headache hot flashes nausea, vomiting pain at site where injected stomach pain This list may not describe all possible side effects. Call your doctor for medical advice about side effects. You may report side effects to FDA at 1-800-FDA-1088. Where should I keep my medication? This drug is given in a hospital or clinic and will not be stored at home. NOTE: This sheet is a summary. It may not cover all possible information. If you have   questions about this medicine, talk to your doctor, pharmacist, or health care provider.  2022 Elsevier/Gold Standard (2017-06-09 11:34:41)  

## 2020-12-17 ENCOUNTER — Encounter: Payer: Self-pay | Admitting: Oncology

## 2020-12-26 DIAGNOSIS — H903 Sensorineural hearing loss, bilateral: Secondary | ICD-10-CM | POA: Insufficient documentation

## 2020-12-26 HISTORY — DX: Sensorineural hearing loss, bilateral: H90.3

## 2021-01-05 ENCOUNTER — Encounter: Payer: Self-pay | Admitting: Oncology

## 2021-01-05 NOTE — Progress Notes (Incomplete)
[facility]  [address] [place],  [place]  [address] [phone]  Clinic Day:  01/05/2021  Referring physician: Ernestene Kiel, MD   This document serves as a record of services personally performed by [name], MD. It was created on their behalf by [name],[name], a trained medical scribe. The creation of this record is based on the scribe's personal observations and the provider's statements to them.  CHIEF COMPLAINT:  CC: History of multiple breast cancers  Current Treatment:  Monthly fulvestrant injections   HISTORY OF PRESENT ILLNESS:  [name] is a 87 y.o. female with a history of multiple breast cancers.  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  Pathology revealed a 0.7 cm lesion which was estrogen and progesterone receptor positive.  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  Estrogen and progesterone receptors were positive.  She received adjuvant radiation.  She was placed on tamoxifen, but stopped it on her own within a few months.  In December 2014, she was found to have a new lesion of the left breast found on routine mammogram and associated with retraction of the left nipple.  Initially, it measured 1.9 cm with an enlarged left axillary node, but slowly enlarged over the next few months, up to 3 cm.  Biopsy confirmed invasive mammary carcinoma with lobular features.  Estrogen and progesterone receptors were positive with her 2 neu negative.  Ki 67 was 34%.  CT scans were negative for metastatic disease.  She has many comorbidities including significant cardiac disease, so declined surgery.  She was placed on hormonal therapy with letrozole 2.5 mg daily in 2015.  She had a good response to this, with control of her disease.  Bilateral mammogram and left breast  ultrasound in February of 2017 showed a persistent node, which was down to 5-6 mm and the subareolar left breast calcifications were stable, with no evidence of definite mass.  She also had a bone density at that time, which continues to show osteopenia.  She stopped the letrozole in March of 2017, because of hot flashes, with improvement of this.  However, she had progressive disease while off  treatment with a recurrent central left breast mass.  She was then placed on fulvestrant injections in May of 2017, and has tolerated these quite well.  She has had a slow decrease in her mass since that time. When I examined her last year, I felt a nodular masslike effect in the epigastrium and so wanted to recheck this.  I brought her back later to recheck it, and it felt soft, like a lipoma.  She eventually had ultrasound imaging and this was found to be a small epigastric ventral hernia containing omental fat.  She had been having diarrhea and Dr. Melina Copa saw her in November but did not recommend colonoscopy because of her age and comorbidities.  She was having increased dyspnea with exertion and was occasionally feeling a tight feeling in her throat.  We referred her back to her cardiologist, Dr. Daneen Schick.  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  She is on liquid B12 supplement and her appetite is good.  She also had an ultrasound of her thyroid in February of 2019 to follow up  on some nodules, and the nodules were stable to decreased in size.  Bone density scan from June 2021 revealed osteoporosis with a T-score of -3.2 of the left forearm, previously -1.8.  The right femur measures -2.2, previously -1.8, and is considered osteopenic.  Dual femur total mean measures -1.7, previously -1.2.    She has been quite ill with diarrhea and requiring hospitalization for 4 days in August 2022 at [facility].  She missed 1 fulvestrant injection during August  and again in September. She is struggling to keep her appointments and keep up with her medications but does have help at home.    INTERVAL HISTORY:  Marieliz is here for routine follow up prior to her next fulvestrant. She states that she has not received an injection this month. She states that she has felt poorly for the past 2 months due to persistent diarrhea, up to 2-3 times per day and sometimes more.  She has had this problem in the past.  She has been evaluated but no etiology has been found, and she has been referred to GI in [place]. She was placed on a new medication yesterday in hopes to improve this. She has also been taking fiber. She does note some fatigue. Hemoglobin is stable at 11.4, and white count and platelets are normal. Chemistries are unremarkable except for a potassium of 3.4, secondary to diarrhea. Her creatinine has improved from 1.3 to 1.0. I gave her a list of potassium rich foods to include in her diet. Her  appetite is good, and she has gained 1 1/2 pounds since her last visit.  She denies fever, chills or other signs of infection.  She denies nausea, vomiting or abdominal pain.  She denies sore throat, cough, dyspnea, or chest pain.  Seraiah is here for routine follow up prior to her next fulvestrant.   Her  appetite is good, and she has gained/lost _ pounds since her last visit.  She denies fever, chills or other signs of infection.  She denies nausea, vomiting, bowel issues, or abdominal pain.  She denies sore throat, cough, dyspnea, or chest pain.  REVIEW OF SYSTEMS:  Review of Systems  Constitutional:  Positive for fatigue. Negative for appetite change, chills, fever and unexpected weight change.  HENT:  Negative.    Eyes: Negative.   Respiratory: Negative.  Negative for chest tightness, cough, hemoptysis, shortness of breath and wheezing.   Cardiovascular: Negative.  Negative for chest pain, leg swelling and palpitations.  Gastrointestinal: Negative.  Negative for  abdominal distention, abdominal pain, blood in stool, constipation, diarrhea, nausea and vomiting.  Endocrine: Negative.   Genitourinary: Negative.  Negative for difficulty urinating, dysuria, frequency and hematuria.   Musculoskeletal: Negative.  Negative for arthralgias, back pain, flank pain, gait problem and myalgias.  Skin: Negative.   Neurological: Negative.  Negative for dizziness, extremity weakness, gait problem, headaches, light-headedness, numbness, seizures and speech difficulty.  Hematological: Negative.   Psychiatric/Behavioral: Negative.  Negative for depression and sleep disturbance. The patient is not nervous/anxious.     VITALS:  There were no vitals taken for this visit.  Wt Readings from Last 3 Encounters:  12/11/20 157 lb 8 oz (71.4 kg)  12/11/20 157 lb 8 oz (71.4 kg)  11/13/20 157 lb 8 oz (71.4 kg)    There is no height or weight on file to calculate BMI.  Performance status (ECOG): 2 - Symptomatic, <50% confined to bed  PHYSICAL EXAM:  Physical Exam Constitutional:  General: She is not in acute distress.    Appearance: Normal appearance. She is normal weight.  HENT:     Head: Normocephalic and atraumatic.  Eyes:     General: No scleral icterus.    Extraocular Movements: Extraocular movements intact.     Conjunctiva/sclera: Conjunctivae normal.     Pupils: Pupils are equal, round, and reactive to light.  Cardiovascular:     Rate and Rhythm: Normal rate and regular rhythm.     Pulses: Normal pulses.     Heart sounds: Normal heart sounds. No murmur heard.   No friction rub. No gallop.  Pulmonary:     Effort: Pulmonary effort is normal. No respiratory distress.     Breath sounds: Normal breath sounds.  Abdominal:     General: Bowel sounds are normal. There is no distension.     Palpations: Abdomen is soft. There is no hepatomegaly, splenomegaly or mass.     Tenderness: There is no abdominal tenderness.  Musculoskeletal:        General: Normal range  of motion.     Cervical back: Normal range of motion and neck supple.     Right lower leg: No edema.     Left lower leg: No edema.  Lymphadenopathy:     Cervical: No cervical adenopathy.  Skin:    General: Skin is warm and dry.  Neurological:     General: No focal deficit present.     Mental Status: She is alert and oriented to person, place, and time. Mental status is at baseline.  Psychiatric:        Mood and Affect: Mood normal.        Behavior: Behavior normal.        Thought Content: Thought content normal.        Judgment: Judgment normal.    LABS:   CBC Latest Ref Rng & Units 12/11/2020 11/11/2020 10/21/2020  WBC - 5.8 8.8 9.8  Hemoglobin 12.0 - 16.0 11.4(A) 11.3(A) 10.7(L)  Hematocrit 36 - 46 36 33(A) 32.1(L)  Platelets 150 - 399 214 269 263   CMP Latest Ref Rng & Units 12/11/2020 11/11/2020 10/21/2020  Glucose 70 - 99 mg/dL - - 117(H)  BUN 4 - 21 8 14  7(L)  Creatinine 0.5 - 1.1 1.0 1.3(A) 0.73  Sodium 137 - 147 138 137 135  Potassium 3.4 - 5.3 3.4 3.9 3.7  Chloride 99 - 108 106 105 105  CO2 13 - 22 24(A) 20 23  Calcium 8.7 - 10.7 9.1 9.2 8.9  Total Protein 6.5 - 8.1 g/dL - - -  Total Bilirubin 0.3 - 1.2 mg/dL - - -  Alkaline Phos 25 - 125 50 55 -  AST 13 - 35 22 25 -  ALT 7 - 35 11 14 -    STUDIES:  No results found.   Allergies:  Allergies  Allergen Reactions   Betadine [Povidone Iodine] Other (See Comments)    Blisters    Latex Hives and Swelling   Zestril [Lisinopril] Cough    Current Medications: Current Outpatient Medications  Medication Sig Dispense Refill   acetaminophen (TYLENOL) 500 MG tablet Take 1,000 mg by mouth daily as needed for mild pain or headache.     acetaminophen-codeine (TYLENOL #3) 300-30 MG tablet Take 1 tablet by mouth 2 (two) times daily as needed.     amLODipine (NORVASC) 2.5 MG tablet TAKE ONE (1) TABLET BY MOUTH ONCE DAILY 30 tablet 3   Ascorbic Acid (VITAMIN C) 1000  MG tablet Take 1,000 mg by mouth daily.     atorvastatin  (LIPITOR) 10 MG tablet Take 10 mg by mouth daily.     Azelastine-Fluticasone 137-50 MCG/ACT SUSP Place 1 spray into both nostrils 2 (two) times daily as needed (congestion).     azithromycin (ZITHROMAX) 250 MG tablet Take by mouth daily. 2 tablets on day 1 and then 1 tablet day 2-5     buPROPion (WELLBUTRIN XL) 150 MG 24 hr tablet Take 150 mg by mouth every morning.     busPIRone (BUSPAR) 5 MG tablet Take 5 mg by mouth 3 (three) times daily as needed (anxiety).     cholecalciferol (VITAMIN D3) 25 MCG (1000 UT) tablet Take 1,000 Units by mouth daily.     cloNIDine (CATAPRES) 0.1 MG tablet Take 0.1 mg by mouth 2 (two) times daily.     diphenoxylate-atropine (LOMOTIL) 2.5-0.025 MG tablet Take 1 tablet by mouth 4 (four) times daily as needed for diarrhea or loose stools. 30 tablet 0   eszopiclone (LUNESTA) 1 MG TABS tablet Take 1-2 mg by mouth at bedtime.     fluconazole (DIFLUCAN) 100 MG tablet Take 100 mg by mouth daily. For 14 days     fluconazole (DIFLUCAN) 200 MG tablet Take 200 mg by mouth daily.     fluticasone (FLONASE) 50 MCG/ACT nasal spray Place 2 sprays into both nostrils daily as needed for allergies or rhinitis.     fulvestrant (FASLODEX) 250 MG/5ML injection Inject into the muscle once. One injection each buttock over 1-2 minutes. Warm prior to use.     HYDROcodone-acetaminophen (NORCO/VICODIN) 5-325 MG tablet Take 1-2 tablets by mouth every 6 (six) hours as needed for moderate pain.     levothyroxine (SYNTHROID, LEVOTHROID) 50 MCG tablet Take 50 mcg by mouth daily before breakfast.      Magnesium Oxide 500 MG CAPS Take by mouth.     metoprolol succinate (TOPROL-XL) 25 MG 24 hr tablet Take 25 mg by mouth daily.      Misc Natural Products (NEURIVA PO) Take 1 capsule by mouth daily.     nystatin (MYCOSTATIN) 100000 UNIT/ML suspension Take 5 mLs (500,000 Units total) by mouth 4 (four) times daily. 60 mL 0   OVER THE COUNTER MEDICATION Take 2 tablets by mouth daily. Bladder Control 360      polycarbophil (FIBERCON) 625 MG tablet Take 625 mg by mouth daily.     Probiotic Product (ALIGN PO) Take 1 capsule by mouth daily.     RESTORA RX 60-1.25 MG CAPS Take 1 capsule by mouth daily.     No current facility-administered medications for this visit.     ASSESSMENT & PLAN:   Assessment:   1. Stage 0 breast cancer April 2009 of the left breast treated with lumpectomy.  2. Stage I invasive ductal carcinoma of the right breast in 2010 treated with surgery and adjuvant radiation.  3. Stage IIB left breast cancer diagnosed in December of 2014 with a 1.9 cm lesion and a 3 cm left axillary node for a T2 N1 M0.  Once again this was estrogen and progesterone receptor positive but she had not taken hormonal therapy in the past.  She declined surgery because of her comorbidities and so was placed on hormonal therapy with letrozole in 2015 until March of 2017. At that time she stopped it because of severe hot flashes and was changed to fulvestrant injections monthly as of May of 2017. She has done very well with that.  Her left breast mass had started to grow back in 2017, but is back under control, and barely even palpable now. However, she has missed some doses this summer.  4. Nodular mass effect in the epigastrium which is a small ventral hernia containing fat.  She underwent hernia repair in December 2020, but I can still feel a small nodule in the epigastrium on palpation.  5. Osteoporosis.  Bone density scan from June 2021 revealed severe worsening and she is now considered osteoporotic with a T-score of -3.2 of the left forearm.  The femur has also shown worsening but is still considered within the osteopenia range.   She will be due for repeat bone density scan in June 2023.  6. Status post TAVR 2019, and doing extremely well.   7. Persistent diarrhea for the past 2-3 months, requiring hospitalization in August. She has been referred to GI in [place]. She is already on fiber.  8.  Hypokalemia, mild, secondary to diarrhea. I gave her a list of foods to include in her diet.  Plan: We will proceed with fulvestrant today.  I will see her back in 4 weeks with CBC and CMP for repeat examination prior to her next fulvestrant.  She understands and agrees with this plan of care.  She knows to call if she needs to be seen sooner.   I provided 20 minutes of face-to-face time during this this encounter and > 50% was spent counseling as documented under my assessment and plan.    Derwood Kaplan, MD [facility] [address] [place] [address] Dept: [phone] Dept Fax: [phone]   I, Rita Ohara, am acting as scribe for Derwood Kaplan, MD  I have reviewed this report as typed by the medical scribe, and it is complete and accurate.

## 2021-01-07 ENCOUNTER — Encounter (INDEPENDENT_AMBULATORY_CARE_PROVIDER_SITE_OTHER): Payer: Self-pay

## 2021-01-09 ENCOUNTER — Ambulatory Visit: Payer: Medicare Other | Admitting: Oncology

## 2021-01-09 ENCOUNTER — Other Ambulatory Visit: Payer: Medicare Other

## 2021-01-09 ENCOUNTER — Ambulatory Visit: Payer: Medicare Other

## 2021-01-09 DIAGNOSIS — D0512 Intraductal carcinoma in situ of left breast: Secondary | ICD-10-CM

## 2021-01-09 DIAGNOSIS — Z170 Estrogen receptor positive status [ER+]: Secondary | ICD-10-CM

## 2021-01-13 NOTE — Progress Notes (Signed)
[facility]  [address] [place],  [place]  [address] [phone]  Clinic Day:  01/14/2021  Referring physician: Ernestene Kiel, MD   This document serves as a record of services personally performed by [name], MD. It was created on their behalf by [name],[name], a trained medical scribe. The creation of this record is based on the scribe's personal observations and the provider's statements to them.  CHIEF COMPLAINT:  CC: History of multiple breast cancers  Current Treatment:  Monthly fulvestrant injections   HISTORY OF PRESENT ILLNESS:  [name] is a 87 y.o. female with a history of multiple breast cancers.  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  Pathology revealed a 0.7 cm lesion which was estrogen and progesterone receptor positive.  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  Estrogen and progesterone receptors were positive.  She received adjuvant radiation.  She was placed on tamoxifen, but stopped it on her own within a few months.  In December 2014, she was found to have a new lesion of the left breast found on routine mammogram and associated with retraction of the left nipple.  Initially, it measured 1.9 cm with an enlarged left axillary node, but slowly enlarged over the next few months, up to 3 cm.  Biopsy confirmed invasive mammary carcinoma with lobular features.  Estrogen and progesterone receptors were positive with her 2 neu negative.  Ki 67 was 34%.  CT scans were negative for metastatic disease.  She has many comorbidities including significant cardiac disease, so declined surgery.  She was placed on hormonal therapy with letrozole 2.5 mg daily in 2015.  She had a good response to this, with control of her disease.  Bilateral mammogram and left breast  ultrasound in February of 2017 showed a persistent node, which was down to 5-6 mm and the subareolar left breast calcifications were stable, with no evidence of definite mass.  She also had a bone density at that time, which continues to show osteopenia.  She stopped the letrozole in March of 2017, because of hot flashes, with improvement of this.  However, she had progressive disease while off  treatment with a recurrent central left breast mass.  She was then placed on fulvestrant injections in May of 2017, and has tolerated these quite well.  She has had a slow decrease in her mass since that time. When I examined her last year, I felt a nodular masslike effect in the epigastrium and so wanted to recheck this.  I brought her back later to recheck it, and it felt soft, like a lipoma.  She eventually had ultrasound imaging and this was found to be a small epigastric ventral hernia containing omental fat.  She had been having diarrhea and Dr. Melina Copa saw her in November but did not recommend colonoscopy because of her age and comorbidities.  She was having increased dyspnea with exertion and was occasionally feeling a tight feeling in her throat.  We referred her back to her cardiologist, Dr. Daneen Schick.  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  She is on liquid B12 supplement and her appetite is good.  She also had an ultrasound of her thyroid in February of 2019 to follow up  on some nodules, and the nodules were stable to decreased in size.  Bone density scan from June 2021 revealed osteoporosis with a T-score of -3.2 of the left forearm, previously -1.8.  The right femur measures -2.2, previously -1.8, and is considered osteopenic.  Dual femur total mean measures -1.7, previously -1.2.    She has been quite ill with diarrhea and requiring hospitalization for 4 days in August 2022 at [facility].  She missed 1 fulvestrant injection during August  and again in September. She is struggling to keep her appointments and keep up with her medications but does have help at home.  She has mild anemia which fluctuates up and down.  INTERVAL HISTORY:  [name] is here for routine follow up prior to her next fulvestrant. She states that her persistent diarrhea for the past 2 months has resolved as of this week. She is doing well. She does note a coating on her tongue that causes a bad taste in her mouth. This has been evaluated and was not thrush, and did not respond to medication for that. Blood counts and chemistries are unremarkable including a hemoglobin of 11.9, improved from 11.4. Chemistries are unremarkable. Her  appetite is good, but she has lost 6 pounds since her last visit.  She denies fever, chills or other signs of infection.  She denies nausea, vomiting, bowel issues, or abdominal pain.  She denies sore throat, cough, dyspnea, or chest pain.  REVIEW OF SYSTEMS:  Review of Systems  Constitutional:  Positive for unexpected weight change (weight loss). Negative for appetite change, chills, fatigue and fever.  HENT:          Yellowish coating of the tongue  Eyes: Negative.   Respiratory: Negative.  Negative for chest tightness, cough, hemoptysis, shortness of breath and wheezing.   Cardiovascular: Negative.  Negative for chest pain, leg swelling and palpitations.  Gastrointestinal:  Negative for abdominal distention, abdominal pain, blood in stool, constipation, diarrhea, nausea and vomiting.  Endocrine: Negative.   Genitourinary: Negative.  Negative for difficulty urinating, dysuria, frequency and hematuria.   Musculoskeletal:  Positive for gait problem (uses a walker). Negative for arthralgias, back pain, flank pain and myalgias.  Skin: Negative.   Neurological:  Positive for gait problem (uses a walker). Negative for dizziness, extremity weakness, headaches, light-headedness, numbness, seizures and speech difficulty.  Hematological:  Negative.   Psychiatric/Behavioral: Negative.  Negative for depression and sleep disturbance. The patient is not nervous/anxious.     VITALS:  There were no vitals taken for this visit.  Wt Readings from Last 3 Encounters:  12/11/20 157 lb 8 oz (71.4 kg)  12/11/20 157 lb 8 oz (71.4 kg)  11/13/20 157 lb 8 oz (71.4 kg)    There is no height or weight on file to calculate BMI.  Performance status (ECOG): 1 - Symptomatic but completely ambulatory  PHYSICAL EXAM:  Physical Exam Constitutional:      General: She is not in acute distress.    Appearance: Normal appearance. She is normal weight.  HENT:     Head: Normocephalic and atraumatic.  Eyes:     General: No scleral icterus.    Extraocular Movements: Extraocular movements intact.     Conjunctiva/sclera: Conjunctivae normal.     Pupils: Pupils are equal, round, and reactive to light.  Cardiovascular:     Rate and Rhythm: Normal rate and regular rhythm.     Pulses: Normal pulses.     Heart sounds: Normal heart sounds. No murmur heard.  No friction rub. No gallop.  Pulmonary:     Effort: Pulmonary effort is normal. No respiratory distress.     Breath sounds: Normal breath sounds.  Chest:     Comments: Bilateral breasts are without masses. Abdominal:     General: Bowel sounds are normal. There is no distension.     Palpations: Abdomen is soft. There is no hepatomegaly, splenomegaly or mass.     Tenderness: There is no abdominal tenderness.  Musculoskeletal:        General: Normal range of motion.     Cervical back: Normal range of motion and neck supple.     Right lower leg: No edema.     Left lower leg: No edema.  Lymphadenopathy:     Cervical: No cervical adenopathy.  Skin:    General: Skin is warm and dry.  Neurological:     General: No focal deficit present.     Mental Status: She is alert and oriented to person, place, and time. Mental status is at baseline.  Psychiatric:        Mood and Affect: Mood normal.         Behavior: Behavior normal.        Thought Content: Thought content normal.        Judgment: Judgment normal.    LABS:   CBC Latest Ref Rng & Units 01/14/2021 12/11/2020 11/11/2020  WBC - 6.0 5.8 8.8  Hemoglobin 12.0 - 16.0 11.9(A) 11.4(A) 11.3(A)  Hematocrit 36 - 46 36 36 33(A)  Platelets 150 - 399 218 214 269   CMP Latest Ref Rng & Units 01/14/2021 12/11/2020 11/11/2020  Glucose 70 - 99 mg/dL - - -  BUN 4 - 21 12 8 14   Creatinine 0.5 - 1.1 1.0 1.0 1.3(A)  Sodium 137 - 147 139 138 137  Potassium 3.4 - 5.3 4.0 3.4 3.9  Chloride 99 - 108 107 106 105  CO2 13 - 22 25(A) 24(A) 20  Calcium 8.7 - 10.7 9.1 9.1 9.2  Total Protein 6.5 - 8.1 g/dL - - -  Total Bilirubin 0.3 - 1.2 mg/dL - - -  Alkaline Phos 25 - 125 45 50 55  AST 13 - 35 25 22 25   ALT 7 - 35 13 11 14     STUDIES:  No results found.   Allergies:  Allergies  Allergen Reactions   Betadine [Povidone Iodine] Other (See Comments)    Blisters    Latex Hives and Swelling   Zestril [Lisinopril] Cough    Current Medications: Current Outpatient Medications  Medication Sig Dispense Refill   acetaminophen (TYLENOL) 500 MG tablet Take 1,000 mg by mouth daily as needed for mild pain or headache.     acetaminophen-codeine (TYLENOL #3) 300-30 MG tablet Take 1 tablet by mouth 2 (two) times daily as needed.     amLODipine (NORVASC) 2.5 MG tablet TAKE ONE (1) TABLET BY MOUTH ONCE DAILY 30 tablet 3   Ascorbic Acid (VITAMIN C) 1000 MG tablet Take 1,000 mg by mouth daily.     atorvastatin (LIPITOR) 10 MG tablet Take 10 mg by mouth daily.     Azelastine-Fluticasone 137-50 MCG/ACT SUSP Place 1 spray into both nostrils 2 (two) times daily as needed (congestion).     buPROPion (WELLBUTRIN XL) 150 MG 24 hr tablet Take 150 mg by mouth every morning.     busPIRone (BUSPAR) 5 MG tablet Take 5 mg by mouth 3 (three) times daily as needed (anxiety).     cholecalciferol (  VITAMIN D3) 25 MCG (1000 UT) tablet Take 1,000 Units by mouth daily.      cloNIDine (CATAPRES) 0.1 MG tablet Take 0.1 mg by mouth 2 (two) times daily.     diclofenac (VOLTAREN) 25 MG EC tablet Take 25 mg by mouth 2 (two) times daily as needed.     diphenoxylate-atropine (LOMOTIL) 2.5-0.025 MG tablet Take 1 tablet by mouth 4 (four) times daily as needed for diarrhea or loose stools. 30 tablet 0   eszopiclone (LUNESTA) 1 MG TABS tablet Take 1-2 mg by mouth at bedtime.     fluconazole (DIFLUCAN) 200 MG tablet Take 200 mg by mouth daily.     fluticasone (FLONASE) 50 MCG/ACT nasal spray Place 2 sprays into both nostrils daily as needed for allergies or rhinitis.     fulvestrant (FASLODEX) 250 MG/5ML injection Inject into the muscle once. One injection each buttock over 1-2 minutes. Warm prior to use.     HYDROcodone-acetaminophen (NORCO/VICODIN) 5-325 MG tablet Take 1-2 tablets by mouth every 6 (six) hours as needed for moderate pain.     levothyroxine (SYNTHROID, LEVOTHROID) 50 MCG tablet Take 50 mcg by mouth daily before breakfast.      Magnesium Oxide 500 MG CAPS Take by mouth.     metoprolol succinate (TOPROL-XL) 25 MG 24 hr tablet Take 25 mg by mouth daily.      Misc Natural Products (NEURIVA PO) Take 1 capsule by mouth daily. Patient has not been taking this medication lately     nystatin (MYCOSTATIN) 100000 UNIT/ML suspension Take 5 mLs (500,000 Units total) by mouth 4 (four) times daily. 60 mL 0   OVER THE COUNTER MEDICATION Take 2 tablets by mouth daily. Bladder Control 360     polycarbophil (FIBERCON) 625 MG tablet Take 625 mg by mouth daily.     Probiotic Product (ALIGN PO) Take 1 capsule by mouth daily.     RESTORA RX 60-1.25 MG CAPS Take 1 capsule by mouth daily.     No current facility-administered medications for this visit.     ASSESSMENT & PLAN:   Assessment:   1. Stage 0 breast cancer April 2009 of the left breast treated with lumpectomy.  2. Stage I invasive ductal carcinoma of the right breast in 2010 treated with surgery and adjuvant  radiation.  3. Stage IIB left breast cancer diagnosed in December of 2014 with a 1.9 cm lesion and a 3 cm left axillary node for a T2 N1 M0.  Once again this was estrogen and progesterone receptor positive but she had not taken hormonal therapy in the past.  She declined surgery because of her comorbidities and so was placed on hormonal therapy with letrozole in 2015 until March of 2017. At that time she stopped it because of severe hot flashes and was changed to fulvestrant injections monthly as of May of 2017. She has done very well with that.  Her left breast mass had started to grow back in 2017, but is back under control, and not palpable now. However, she has missed some doses this summer.  4. Nodular mass effect in the epigastrium which is a small ventral hernia containing fat.  She underwent hernia repair in December 2020, but I can still feel a small nodule in the epigastrium on palpation.  5. Osteoporosis.  Bone density scan from June 2021 revealed severe worsening and she is now considered osteoporotic with a T-score of -3.2 of the left forearm.  The femur has also shown worsening but is  still considered within the osteopenia range.   She will be due for repeat bone density scan in June 2023.  6. Status post TAVR 2019, and doing extremely well.   Plan: We will proceed with fulvestrant today and again in 4 weeks.  I will see her back in 8 weeks with CBC and CMP for repeat examination prior to her next fulvestrant.  She understands and agrees with this plan of care.  She knows to call if she needs to be seen sooner.   I provided 20 minutes of face-to-face time during this this encounter and > 50% was spent counseling as documented under my assessment and plan.    Derwood Kaplan, MD [facility] [address] [place] [address] Dept: [phone] Dept Fax: [phone]   I, Rita Ohara, am acting as scribe for  Derwood Kaplan, MD  I have reviewed this report as typed by the medical scribe, and it is complete and accurate.

## 2021-01-14 ENCOUNTER — Inpatient Hospital Stay: Payer: Medicare Other | Attending: Oncology

## 2021-01-14 ENCOUNTER — Telehealth: Payer: Self-pay | Admitting: Oncology

## 2021-01-14 ENCOUNTER — Inpatient Hospital Stay (INDEPENDENT_AMBULATORY_CARE_PROVIDER_SITE_OTHER): Payer: Medicare Other | Admitting: Oncology

## 2021-01-14 ENCOUNTER — Inpatient Hospital Stay: Payer: Medicare Other

## 2021-01-14 ENCOUNTER — Encounter: Payer: Self-pay | Admitting: Oncology

## 2021-01-14 ENCOUNTER — Other Ambulatory Visit: Payer: Self-pay

## 2021-01-14 VITALS — BP 148/62 | HR 63 | Temp 98.1°F | Resp 18 | Ht 59.0 in | Wt 151.8 lb

## 2021-01-14 VITALS — BP 132/80 | HR 62 | Temp 98.0°F | Resp 18 | Ht 59.0 in | Wt 153.2 lb

## 2021-01-14 DIAGNOSIS — C50112 Malignant neoplasm of central portion of left female breast: Secondary | ICD-10-CM

## 2021-01-14 DIAGNOSIS — Z79818 Long term (current) use of other agents affecting estrogen receptors and estrogen levels: Secondary | ICD-10-CM | POA: Insufficient documentation

## 2021-01-14 DIAGNOSIS — Z170 Estrogen receptor positive status [ER+]: Secondary | ICD-10-CM | POA: Insufficient documentation

## 2021-01-14 DIAGNOSIS — C50411 Malignant neoplasm of upper-outer quadrant of right female breast: Secondary | ICD-10-CM

## 2021-01-14 LAB — CBC AND DIFFERENTIAL
HCT: 36 (ref 36–46)
Hemoglobin: 11.9 — AB (ref 12.0–16.0)
Neutrophils Absolute: 4.62
Platelets: 218 (ref 150–399)
WBC: 6

## 2021-01-14 LAB — BASIC METABOLIC PANEL WITH GFR
BUN: 12 (ref 4–21)
CO2: 25 — AB (ref 13–22)
Chloride: 107 (ref 99–108)
Creatinine: 1 (ref 0.5–1.1)
Glucose: 96
Potassium: 4 (ref 3.4–5.3)
Sodium: 139 (ref 137–147)

## 2021-01-14 LAB — COMPREHENSIVE METABOLIC PANEL WITH GFR
Albumin: 3.8 (ref 3.5–5.0)
Calcium: 9.1 (ref 8.7–10.7)

## 2021-01-14 LAB — HEPATIC FUNCTION PANEL
ALT: 13 (ref 7–35)
AST: 25 (ref 13–35)
Alkaline Phosphatase: 45 (ref 25–125)
Bilirubin, Total: 1.1

## 2021-01-14 LAB — CBC: RBC: 4.04 (ref 3.87–5.11)

## 2021-01-14 MED ORDER — FULVESTRANT 250 MG/5ML IM SOSY
500.0000 mg | PREFILLED_SYRINGE | Freq: Once | INTRAMUSCULAR | Status: AC
  Administered 2021-01-14: 500 mg via INTRAMUSCULAR
  Filled 2021-01-14: qty 10

## 2021-01-14 NOTE — Patient Instructions (Signed)
Fulvestrant injection What is this medication? FULVESTRANT (ful VES trant) blocks the effects of estrogen. It is used to treat breast cancer. This medicine may be used for other purposes; ask your health care provider or pharmacist if you have questions. COMMON BRAND NAME(S): FASLODEX What should I tell my care team before I take this medication? They need to know if you have any of these conditions: bleeding disorders liver disease low blood counts, like low white cell, platelet, or red cell counts an unusual or allergic reaction to fulvestrant, other medicines, foods, dyes, or preservatives pregnant or trying to get pregnant breast-feeding How should I use this medication? This medicine is for injection into a muscle. It is usually given by a health care professional in a hospital or clinic setting. Talk to your pediatrician regarding the use of this medicine in children. Special care may be needed. Overdosage: If you think you have taken too much of this medicine contact a poison control center or emergency room at once. NOTE: This medicine is only for you. Do not share this medicine with others. What if I miss a dose? It is important not to miss your dose. Call your doctor or health care professional if you are unable to keep an appointment. What may interact with this medication? medicines that treat or prevent blood clots like warfarin, enoxaparin, dalteparin, apixaban, dabigatran, and rivaroxaban This list may not describe all possible interactions. Give your health care provider a list of all the medicines, herbs, non-prescription drugs, or dietary supplements you use. Also tell them if you smoke, drink alcohol, or use illegal drugs. Some items may interact with your medicine. What should I watch for while using this medication? Your condition will be monitored carefully while you are receiving this medicine. You will need important blood work done while you are taking this  medicine. Do not become pregnant while taking this medicine or for at least 1 year after stopping it. Women of child-bearing potential will need to have a negative pregnancy test before starting this medicine. Women should inform their doctor if they wish to become pregnant or think they might be pregnant. There is a potential for serious side effects to an unborn child. Men should inform their doctors if they wish to father a child. This medicine may lower sperm counts. Talk to your health care professional or pharmacist for more information. Do not breast-feed an infant while taking this medicine or for 1 year after the last dose. What side effects may I notice from receiving this medication? Side effects that you should report to your doctor or health care professional as soon as possible: allergic reactions like skin rash, itching or hives, swelling of the face, lips, or tongue feeling faint or lightheaded, falls pain, tingling, numbness, or weakness in the legs signs and symptoms of infection like fever or chills; cough; flu-like symptoms; sore throat vaginal bleeding Side effects that usually do not require medical attention (report to your doctor or health care professional if they continue or are bothersome): aches, pains constipation diarrhea headache hot flashes nausea, vomiting pain at site where injected stomach pain This list may not describe all possible side effects. Call your doctor for medical advice about side effects. You may report side effects to FDA at 1-800-FDA-1088. Where should I keep my medication? This drug is given in a hospital or clinic and will not be stored at home. NOTE: This sheet is a summary. It may not cover all possible information. If you have   questions about this medicine, talk to your doctor, pharmacist, or health care provider.  2022 Elsevier/Gold Standard (2017-06-09 11:34:41)  

## 2021-01-14 NOTE — Telephone Encounter (Signed)
Per 11/2 los next appt scheduled and given to patient 

## 2021-01-15 ENCOUNTER — Encounter: Payer: Self-pay | Admitting: Oncology

## 2021-01-19 ENCOUNTER — Other Ambulatory Visit: Payer: Self-pay | Admitting: Oncology

## 2021-01-19 ENCOUNTER — Encounter (INDEPENDENT_AMBULATORY_CARE_PROVIDER_SITE_OTHER): Payer: Self-pay | Admitting: Ophthalmology

## 2021-01-19 ENCOUNTER — Encounter: Payer: Self-pay | Admitting: Oncology

## 2021-01-19 ENCOUNTER — Other Ambulatory Visit: Payer: Self-pay

## 2021-01-19 ENCOUNTER — Ambulatory Visit (INDEPENDENT_AMBULATORY_CARE_PROVIDER_SITE_OTHER): Payer: Medicare Other | Admitting: Ophthalmology

## 2021-01-19 DIAGNOSIS — H353132 Nonexudative age-related macular degeneration, bilateral, intermediate dry stage: Secondary | ICD-10-CM | POA: Insufficient documentation

## 2021-01-19 DIAGNOSIS — H43319 Vitreous membranes and strands, unspecified eye: Secondary | ICD-10-CM

## 2021-01-19 DIAGNOSIS — H43312 Vitreous membranes and strands, left eye: Secondary | ICD-10-CM

## 2021-01-19 DIAGNOSIS — I2510 Atherosclerotic heart disease of native coronary artery without angina pectoris: Secondary | ICD-10-CM | POA: Diagnosis not present

## 2021-01-19 DIAGNOSIS — Z170 Estrogen receptor positive status [ER+]: Secondary | ICD-10-CM

## 2021-01-19 HISTORY — DX: Nonexudative age-related macular degeneration, bilateral, intermediate dry stage: H35.3132

## 2021-01-19 HISTORY — DX: Vitreous membranes and strands, unspecified eye: H43.319

## 2021-01-19 NOTE — Assessment & Plan Note (Signed)
Bilateral intermediate ARMD no sign of complications, no CNVM

## 2021-01-19 NOTE — Progress Notes (Signed)
01/19/2021     CHIEF COMPLAINT Patient presents for  Chief Complaint  Patient presents with   Retina Evaluation      HISTORY OF PRESENT ILLNESS: [name] is a 87 y.o. female who presents to the clinic today for:   HPI     Retina Evaluation   In both eyes.        Comments   NP- referred by [facility], Dr. Antionette Fairy, for Vitrectomy. Pt states vision in left eye is worse, cannot read the t.v. captions. Had been 3-4 weeks of decline in vision before seeing Dr. Antionette Fairy 01/07/2021. Pt allergy to betadine.       Last edited by Laurin Coder on 01/19/2021  2:25 PM.      Referring physician: Noralyn Pick, MD [address] [place],  [place] [address]  HISTORICAL INFORMATION:   Selected notes from the MEDICAL RECORD NUMBER    Lab Results  Component Value Date   HGBA1C 5.3 07/29/2017     CURRENT MEDICATIONS: No current outpatient medications on file. (Ophthalmic Drugs)   No current facility-administered medications for this visit. (Ophthalmic Drugs)   Current Outpatient Medications (Other)  Medication Sig   acetaminophen (TYLENOL) 500 MG tablet Take 1,000 mg by mouth daily as needed for mild pain or headache.   acetaminophen-codeine (TYLENOL #3) 300-30 MG tablet Take 1 tablet by mouth 2 (two) times daily as needed.   amLODipine (NORVASC) 2.5 MG tablet TAKE ONE (1) TABLET BY MOUTH ONCE DAILY   Ascorbic Acid (VITAMIN C) 1000 MG tablet Take 1,000 mg by mouth daily.   atorvastatin (LIPITOR) 10 MG tablet Take 10 mg by mouth daily.   Azelastine-Fluticasone 137-50 MCG/ACT SUSP Place 1 spray into both nostrils 2 (two) times daily as needed (congestion).   buPROPion (WELLBUTRIN XL) 150 MG 24 hr tablet Take 150 mg by mouth every morning.   busPIRone (BUSPAR) 5 MG tablet Take 5 mg by mouth 3 (three) times daily as needed (anxiety).   cholecalciferol (VITAMIN D3) 25 MCG (1000 UT) tablet Take 1,000 Units by mouth  daily.   cloNIDine (CATAPRES) 0.1 MG tablet Take 0.1 mg by mouth 2 (two) times daily.   diclofenac (VOLTAREN) 25 MG EC tablet Take 25 mg by mouth 2 (two) times daily as needed.   diphenoxylate-atropine (LOMOTIL) 2.5-0.025 MG tablet Take 1 tablet by mouth 4 (four) times daily as needed for diarrhea or loose stools.   eszopiclone (LUNESTA) 1 MG TABS tablet Take 1-2 mg by mouth at bedtime.   fluconazole (DIFLUCAN) 200 MG tablet Take 200 mg by mouth daily.   fluticasone (FLONASE) 50 MCG/ACT nasal spray Place 2 sprays into both nostrils daily as needed for allergies or rhinitis.   fulvestrant (FASLODEX) 250 MG/5ML injection Inject into the muscle once. One injection each buttock over 1-2 minutes. Warm prior to use.   HYDROcodone-acetaminophen (NORCO/VICODIN) 5-325 MG tablet Take 1-2 tablets by mouth every 6 (six) hours as needed for moderate pain.   levothyroxine (SYNTHROID, LEVOTHROID) 50 MCG tablet Take 50 mcg by mouth daily before breakfast.    Magnesium Oxide 500 MG CAPS Take by mouth.   metoprolol succinate (TOPROL-XL) 25 MG 24 hr tablet Take 25 mg by mouth daily.    Misc Natural Products (NEURIVA PO) Take 1 capsule by mouth daily. Patient has not been taking this medication lately   nystatin (MYCOSTATIN) 100000 UNIT/ML suspension Take 5 mLs (500,000 Units total) by mouth 4 (four) times daily.  OVER THE COUNTER MEDICATION Take 2 tablets by mouth daily. Bladder Control 360   polycarbophil (FIBERCON) 625 MG tablet Take 625 mg by mouth daily.   Probiotic Product (ALIGN PO) Take 1 capsule by mouth daily.   RESTORA RX 60-1.25 MG CAPS Take 1 capsule by mouth daily.   No current facility-administered medications for this visit. (Other)      REVIEW OF SYSTEMS:    ALLERGIES Allergies  Allergen Reactions   Betadine [Povidone Iodine] Other (See Comments)    Blisters    Latex Hives and Swelling   Zestril [Lisinopril] Cough    PAST MEDICAL HISTORY Past Medical History:  Diagnosis Date    Aortic stenosis, severe    a. 07/2017: s/p TAVR w/ an Edwards Sapien 3 THV (size 26 mm, model # [id number], serial # [id number])   CAD (coronary artery disease)    a. 2010: s/p stent to RCA   Chronic kidney disease, stage III (moderate) (HCC)    Chronic sinusitis    Depressive disorder    History of breast cancer    a. s/p lumpectomies and XRT   Hyperlipemia    Hypertension    Hypothyroid    Insomnia    Memory loss    Osteoarthritis    Reflux esophagitis    Past Surgical History:  Procedure Laterality Date   ANGIOPLASTY     BIOPSY  10/21/2020   Procedure: BIOPSY;  Surgeon: Carol Ada, MD;  Location: [facility];  Service: Endoscopy;;   BREAST LUMPECTOMY     x2   ESOPHAGOGASTRODUODENOSCOPY Left 10/21/2020   Procedure: ESOPHAGOGASTRODUODENOSCOPY (EGD);  Surgeon: Carol Ada, MD;  Location: [facility];  Service: Endoscopy;  Laterality: Left;  Abnormal barium swallow   FOOT TENDON SURGERY     GALLBLADDER SURGERY     INCISIONAL HERNIA REPAIR N/A 03/01/2019   Procedure: OPEN INCISIONAL HERNIA REPAIR;  Surgeon: Armandina Gemma, MD;  Location: [facility];  Service: General;  Laterality: N/A;   INSERTION OF MESH N/A 03/01/2019   Procedure: INSERTION OF MESH;  Surgeon: Armandina Gemma, MD;  Location: [facility];  Service: General;  Laterality: N/A;   INTRAOPERATIVE TRANSTHORACIC ECHOCARDIOGRAM N/A 08/02/2017   Procedure: INTRAOPERATIVE TRANSTHORACIC ECHOCARDIOGRAM;  Surgeon: Burnell Blanks, MD;  Location: [facility];  Service: Open Heart Surgery;  Laterality: N/A;   REPLACEMENT TOTAL KNEE BILATERAL     RIGHT/LEFT HEART CATH AND CORONARY ANGIOGRAPHY N/A 07/05/2017   Procedure: RIGHT/LEFT HEART CATH AND CORONARY ANGIOGRAPHY;  Surgeon: Belva Crome, MD;  Location: [facility];  Service: Cardiovascular;  Laterality: N/A;   SKIN CANCER EXCISION  2018   right nostril    TOTAL ABDOMINAL HYSTERECTOMY     TRANSCATHETER AORTIC VALVE REPLACEMENT, TRANSFEMORAL N/A 08/02/2017   Procedure: TRANSCATHETER  AORTIC VALVE REPLACEMENT, TRANSFEMORAL;  Surgeon: Burnell Blanks, MD;  Location: [facility];  Service: Open Heart Surgery;  Laterality: N/A;    FAMILY HISTORY Family History  Problem Relation Age of Onset   Heart disease Father    Stroke Mother    Heart disease Maternal Grandfather    Breast cancer Sister     SOCIAL HISTORY Social History   Tobacco Use   Smoking status: Former    Packs/day: 0.50    Years: 20.00    Pack years: 10.00    Types: Cigarettes    Quit date: 03/15/1964    Years since quitting: 56.8   Smokeless tobacco: Never  Vaping Use   Vaping Use: Never used  Substance Use Topics  Alcohol use: Yes    Comment: social   Drug use: No         OPHTHALMIC EXAM:  Base Eye Exam     Visual Acuity (ETDRS)       Right Left   Dist cc 20/30 -1 20/50   Dist ph cc  20/40    Correction: Glasses  Left eye per patient "looks like it is wiggling."        Tonometry (Tonopen, 2:31 PM)       Right Left   Pressure 15 10         Pupils       Pupils Dark Light Shape React APD   Right PERRL 4 3 Round Brisk None   Left PERRL 4 3 Round Brisk None         Visual Fields (Counting fingers)       Left Right    Full Full         Extraocular Movement       Right Left    Full Full         Neuro/Psych     Oriented x3: Yes   Mood/Affect: Normal         Dilation     Both eyes: 1.0% Mydriacyl, 2.5% Phenylephrine @ 2:30 PM           Slit Lamp and Fundus Exam     External Exam       Right Left   External Normal Normal         Slit Lamp Exam       Right Left   Lids/Lashes Normal Normal   Conjunctiva/Sclera White and quiet White and quiet   Cornea Clear Clear   Anterior Chamber Deep and quiet Deep and quiet   Iris Round and reactive Round and reactive   Lens Clear Clear   Anterior Vitreous Normal Normal         Fundus Exam       Right Left   Posterior Vitreous Posterior vitreous detachment Posterior vitreous  detachment, Central vitreous floaters 4+ , Weiss ring, very dense cobweb type of vitreous debris in the visual axis, no pigment no cells   Disc Normal Normal   C/D Ratio 0.55 0.55   Macula Normal Normal   Vessels Normal Normal   Periphery Normal Normal, no holes or tears            IMAGING AND PROCEDURES  Imaging and Procedures for 01/19/21  OCT, Retina - OU - Both Eyes       Right Eye Quality was good. Scan locations included subfoveal. Central Foveal Thickness: 309. Progression has no prior data. Findings include retinal drusen , abnormal foveal contour.   Left Eye Quality was good. Scan locations included subfoveal. Central Foveal Thickness: 306. Progression has no prior data. Findings include abnormal foveal contour, retinal drusen .      Color Fundus Photography Optos - OU - Both Eyes       Right Eye Disc findings include normal observations. Macula : normal observations. Vessels : normal observations. Periphery : normal observations.   Left Eye Progression has no prior data. Disc findings include normal observations. Macula : normal observations. Vessels : normal observations. Periphery : normal observations.   Notes Dense medial opacity OS as seen on red free and Color photos OS              ASSESSMENT/PLAN:  Vitreous membranes and strands OS with severe vitreous floaters  and vitreous debris but with large cortical hyaloid posterior cortical that is with a Weiss ring, hovering over the visual axis.  The density of this floater is such that is not likely to dissolve, or diminish over time moreover it is suspended in the visual axis which impairs her activities of daily living particularly reading close captioning TV as she is challenged with hearing  I will discuss with the patient risk benefits regarding surgical intervention.  I explained to the patient the patient experience of surgery is much like cataract surgery meaning that the vitrectomy is carried out  with the patient relaxed drowsy with mild sedation with a total numbness to the eye and about a 6 to 8-minute procedure as an outpatient.  The patient will be on drops for 2 days before and a total of 3 weeks total after the surgery.  She did have 10 days of limited activity meaning no heavy lifting bending or straining otherwise normal activities of daily living with only missing 1 night of washing her hair  I have explained the patient that most often we will wait for floaters to diminish dissolve move out of the way however my experiences with the size of these cortical hyaloid opaque vitreous debris hovering in the visual axis that is not likely to happen in the near future in her case.  Thus we have the option to proceed so that she can have maximal visual potential And Regaining the Use of Reading for close caption on her TV  Intermediate stage nonexudative age-related macular degeneration of both eyes Bilateral intermediate ARMD no sign of complications, no CNVM     ICD-10-CM   1. 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 Intermediate stage nonexudative age-related macular degeneration of both eyes  H35.3132 OCT, Retina - OU - Both Eyes      1.  Risk and benefits reviewed with the patient as well as family.  2.  We will plan for vitrectomy left eye in order to enhance the patient's and maximize the patient's visual acuity  3.  Ophthalmic Meds Ordered this visit:  No orders of the defined types were placed in this encounter.      Return ,, [facility], for Per patient will consider schedule [id number], OS.  There are no Patient Instructions on file for this visit.   Explained the diagnoses, plan, and follow up with the patient and they expressed understanding.  Patient expressed understanding of the importance of proper follow up care.   Clent Demark Emet Rafanan  M.D. Diseases & Surgery of the Retina and Vitreous Retina & Diabetic [place] 01/19/21     Abbreviations: M myopia (nearsighted); A astigmatism; H hyperopia (farsighted); P presbyopia; Mrx spectacle prescription;  CTL contact lenses; OD right eye; OS left eye; OU both eyes  XT exotropia; ET esotropia; PEK punctate epithelial keratitis; PEE punctate epithelial erosions; DES dry eye syndrome; MGD meibomian gland dysfunction; ATs artificial tears; PFAT's preservative free artificial tears; [place] nuclear sclerotic cataract; PSC posterior subcapsular cataract; ERM epi-retinal membrane; PVD posterior vitreous detachment; RD retinal detachment; DM diabetes mellitus; DR diabetic retinopathy; NPDR non-proliferative diabetic retinopathy; PDR proliferative diabetic retinopathy; CSME clinically significant macular edema; DME diabetic macular edema; dbh dot blot hemorrhages; CWS cotton wool spot; POAG primary open angle glaucoma; C/D cup-to-disc ratio; HVF humphrey visual field; GVF goldmann visual field;  OCT optical coherence tomography; IOP intraocular pressure; BRVO Branch retinal vein occlusion; CRVO central retinal vein occlusion; CRAO central retinal artery occlusion; BRAO branch retinal artery occlusion; RT retinal tear; SB scleral buckle; PPV pars plana vitrectomy; VH Vitreous hemorrhage; PRP panretinal laser photocoagulation; IVK intravitreal kenalog; VMT vitreomacular traction; MH Macular hole;  NVD neovascularization of the disc; NVE neovascularization elsewhere; AREDS age related eye disease study; ARMD age related macular degeneration; POAG primary open angle glaucoma; EBMD epithelial/anterior basement membrane dystrophy; ACIOL anterior chamber intraocular lens; IOL intraocular lens; PCIOL posterior chamber intraocular lens; Phaco/IOL phacoemulsification with intraocular lens placement; [place] photorefractive keratectomy; LASIK laser assisted in situ keratomileusis; HTN hypertension; DM diabetes mellitus; COPD  chronic obstructive pulmonary disease

## 2021-01-19 NOTE — Assessment & Plan Note (Addendum)
OS with severe vitreous floaters and vitreous debris but with large cortical hyaloid posterior cortical that is with a Weiss ring, hovering over the visual axis.  The density of this floater is such that is not likely to dissolve, or diminish over time moreover it is suspended in the visual axis which impairs her activities of daily living particularly reading close captioning TV as she is challenged with hearing  I will discuss with the patient risk benefits regarding surgical intervention.  I explained to the patient the patient experience of surgery is much like cataract surgery meaning that the vitrectomy is carried out with the patient relaxed drowsy with mild sedation with a total numbness to the eye and about a 6 to 8-minute procedure as an outpatient.  The patient will be on drops for 2 days before and a total of 3 weeks total after the surgery.  She did have 10 days of limited activity meaning no heavy lifting bending or straining otherwise normal activities of daily living with only missing 1 night of washing her hair  I have explained the patient that most often we will wait for floaters to diminish dissolve move out of the way however my experiences with the size of these cortical hyaloid opaque vitreous debris hovering in the visual axis that is not likely to happen in the near future in her case.  Thus we have the option to proceed so that she can have maximal visual potential And Regaining the Use of Reading for close caption on her TV

## 2021-01-20 ENCOUNTER — Other Ambulatory Visit: Payer: Self-pay | Admitting: Pharmacist

## 2021-01-27 ENCOUNTER — Encounter (INDEPENDENT_AMBULATORY_CARE_PROVIDER_SITE_OTHER): Payer: Self-pay

## 2021-01-27 ENCOUNTER — Other Ambulatory Visit: Payer: Self-pay

## 2021-01-27 ENCOUNTER — Ambulatory Visit (INDEPENDENT_AMBULATORY_CARE_PROVIDER_SITE_OTHER): Payer: Medicare Other

## 2021-01-27 MED ORDER — PREDNISOLONE ACETATE 1 % OP SUSP: 1.0000 [drp] | Freq: Four times a day (QID) | OPHTHALMIC | 0 refills | Status: AC

## 2021-01-27 MED ORDER — OFLOXACIN 0.3 % OP SOLN: 1.0000 [drp] | Freq: Four times a day (QID) | OPHTHALMIC | 0 refills | Status: AC

## 2021-01-28 ENCOUNTER — Ambulatory Visit (INDEPENDENT_AMBULATORY_CARE_PROVIDER_SITE_OTHER): Payer: Medicare Other

## 2021-01-30 ENCOUNTER — Ambulatory Visit (INDEPENDENT_AMBULATORY_CARE_PROVIDER_SITE_OTHER): Payer: Medicare Other

## 2021-02-09 ENCOUNTER — Other Ambulatory Visit: Payer: Self-pay | Admitting: Pharmacist

## 2021-02-09 ENCOUNTER — Inpatient Hospital Stay: Payer: Medicare Other

## 2021-02-09 ENCOUNTER — Other Ambulatory Visit: Payer: Self-pay

## 2021-02-09 VITALS — BP 168/85 | HR 69 | Temp 98.0°F | Resp 18 | Ht 59.0 in | Wt 153.2 lb

## 2021-02-09 DIAGNOSIS — Z170 Estrogen receptor positive status [ER+]: Secondary | ICD-10-CM

## 2021-02-09 DIAGNOSIS — C50112 Malignant neoplasm of central portion of left female breast: Secondary | ICD-10-CM | POA: Diagnosis not present

## 2021-02-09 MED ORDER — FULVESTRANT 250 MG/5ML IM SOSY
500.0000 mg | PREFILLED_SYRINGE | Freq: Once | INTRAMUSCULAR | Status: AC
  Administered 2021-02-09: 500 mg via INTRAMUSCULAR
  Filled 2021-02-09: qty 10

## 2021-02-09 NOTE — Patient Instructions (Signed)
Fulvestrant injection °What is this medication? °FULVESTRANT (ful VES trant) blocks the effects of estrogen. It is used to treat breast cancer. °This medicine may be used for other purposes; ask your health care provider or pharmacist if you have questions. °COMMON BRAND NAME(S): FASLODEX °What should I tell my care team before I take this medication? °They need to know if you have any of these conditions: °bleeding disorders °liver disease °low blood counts, like low white cell, platelet, or red cell counts °an unusual or allergic reaction to fulvestrant, other medicines, foods, dyes, or preservatives °pregnant or trying to get pregnant °breast-feeding °How should I use this medication? °This medicine is for injection into a muscle. It is usually given by a health care professional in a hospital or clinic setting. °Talk to your pediatrician regarding the use of this medicine in children. Special care may be needed. °Overdosage: If you think you have taken too much of this medicine contact a poison control center or emergency room at once. °NOTE: This medicine is only for you. Do not share this medicine with others. °What if I miss a dose? °It is important not to miss your dose. Call your doctor or health care professional if you are unable to keep an appointment. °What may interact with this medication? °medicines that treat or prevent blood clots like warfarin, enoxaparin, dalteparin, apixaban, dabigatran, and rivaroxaban °This list may not describe all possible interactions. Give your health care provider a list of all the medicines, herbs, non-prescription drugs, or dietary supplements you use. Also tell them if you smoke, drink alcohol, or use illegal drugs. Some items may interact with your medicine. °What should I watch for while using this medication? °Your condition will be monitored carefully while you are receiving this medicine. You will need important blood work done while you are taking this  medicine. °Do not become pregnant while taking this medicine or for at least 1 year after stopping it. Women of child-bearing potential will need to have a negative pregnancy test before starting this medicine. Women should inform their doctor if they wish to become pregnant or think they might be pregnant. There is a potential for serious side effects to an unborn child. Men should inform their doctors if they wish to father a child. This medicine may lower sperm counts. Talk to your health care professional or pharmacist for more information. Do not breast-feed an infant while taking this medicine or for 1 year after the last dose. °What side effects may I notice from receiving this medication? °Side effects that you should report to your doctor or health care professional as soon as possible: °allergic reactions like skin rash, itching or hives, swelling of the face, lips, or tongue °feeling faint or lightheaded, falls °pain, tingling, numbness, or weakness in the legs °signs and symptoms of infection like fever or chills; cough; flu-like symptoms; sore throat °vaginal bleeding °Side effects that usually do not require medical attention (report to your doctor or health care professional if they continue or are bothersome): °aches, pains °constipation °diarrhea °headache °hot flashes °nausea, vomiting °pain at site where injected °stomach pain °This list may not describe all possible side effects. Call your doctor for medical advice about side effects. You may report side effects to FDA at 1-800-FDA-1088. °Where should I keep my medication? °This drug is given in a hospital or clinic and will not be stored at home. °NOTE: This sheet is a summary. It may not cover all possible information. If you have   questions about this medicine, talk to your doctor, pharmacist, or health care provider. °© 2022 [employer] (2017-06-14 00:00:00) ° °

## 2021-02-11 ENCOUNTER — Encounter (AMBULATORY_SURGERY_CENTER): Payer: Medicare Other | Admitting: Ophthalmology

## 2021-02-11 ENCOUNTER — Ambulatory Visit: Payer: Medicare Other

## 2021-02-11 DIAGNOSIS — H43312 Vitreous membranes and strands, left eye: Secondary | ICD-10-CM

## 2021-02-12 ENCOUNTER — Ambulatory Visit (INDEPENDENT_AMBULATORY_CARE_PROVIDER_SITE_OTHER): Payer: Medicare Other | Admitting: Ophthalmology

## 2021-02-12 ENCOUNTER — Encounter (INDEPENDENT_AMBULATORY_CARE_PROVIDER_SITE_OTHER): Payer: Medicare Other | Admitting: Ophthalmology

## 2021-02-12 ENCOUNTER — Other Ambulatory Visit: Payer: Self-pay

## 2021-02-12 ENCOUNTER — Encounter (INDEPENDENT_AMBULATORY_CARE_PROVIDER_SITE_OTHER): Payer: Self-pay | Admitting: Ophthalmology

## 2021-02-12 DIAGNOSIS — H43312 Vitreous membranes and strands, left eye: Secondary | ICD-10-CM

## 2021-02-12 NOTE — Assessment & Plan Note (Signed)
Postop day #1 looks great with clear vitreous

## 2021-02-12 NOTE — Patient Instructions (Signed)

## 2021-02-12 NOTE — Progress Notes (Signed)
02/12/2021     CHIEF COMPLAINT Patient presents for  Chief Complaint  Patient presents with   Post-op Follow-up      HISTORY OF PRESENT ILLNESS: [name] is a 87 y.o. female who presents to the clinic today for:   HPI     Post-op Follow-up   In left eye.  Discomfort includes Negative for pain, itching and foreign body sensation.        Comments   1 day PO PPV OS Pt states, "I did not have one single pain last night. I do not know how well I can see but I did not have any issues at all after surgery." Pt reports using Ofloxacin and Pred QID OS       Last edited by Kendra Opitz, COA on 02/12/2021  8:07 AM.      Referring physician: Ernestene Kiel, MD [address] [place],  [place] [address]  HISTORICAL INFORMATION:   Selected notes from the MEDICAL RECORD NUMBER    Lab Results  Component Value Date   HGBA1C 5.3 07/29/2017     CURRENT MEDICATIONS: Current Outpatient Medications (Ophthalmic Drugs)  Medication Sig   ofloxacin (OCUFLOX) 0.3 % ophthalmic solution Place 1 drop into the left eye in the morning, at noon, in the evening, and at bedtime for 21 days.   No current facility-administered medications for this visit. (Ophthalmic Drugs)   Current Outpatient Medications (Other)  Medication Sig   acetaminophen (TYLENOL) 500 MG tablet Take 1,000 mg by mouth daily as needed for mild pain or headache.   acetaminophen-codeine (TYLENOL #3) 300-30 MG tablet Take 1 tablet by mouth 2 (two) times daily as needed.   amLODipine (NORVASC) 2.5 MG tablet TAKE ONE (1) TABLET BY MOUTH ONCE DAILY   Ascorbic Acid (VITAMIN C) 1000 MG tablet Take 1,000 mg by mouth daily.   atorvastatin (LIPITOR) 10 MG tablet Take 10 mg by mouth daily.   Azelastine-Fluticasone 137-50 MCG/ACT SUSP Place 1 spray into both nostrils 2 (two) times daily as needed (congestion).   buPROPion (WELLBUTRIN XL) 150 MG 24 hr tablet Take 150 mg by mouth every morning.   busPIRone (BUSPAR)  5 MG tablet Take 5 mg by mouth 3 (three) times daily as needed (anxiety).   cholecalciferol (VITAMIN D3) 25 MCG (1000 UT) tablet Take 1,000 Units by mouth daily.   cloNIDine (CATAPRES) 0.1 MG tablet Take 0.1 mg by mouth 2 (two) times daily.   diclofenac (VOLTAREN) 25 MG EC tablet Take 25 mg by mouth 2 (two) times daily as needed.   diphenoxylate-atropine (LOMOTIL) 2.5-0.025 MG tablet Take 1 tablet by mouth 4 (four) times daily as needed for diarrhea or loose stools.   eszopiclone (LUNESTA) 1 MG TABS tablet Take 1-2 mg by mouth at bedtime.   fluconazole (DIFLUCAN) 200 MG tablet Take 200 mg by mouth daily.   fluticasone (FLONASE) 50 MCG/ACT nasal spray Place 2 sprays into both nostrils daily as needed for allergies or rhinitis.   fulvestrant (FASLODEX) 250 MG/5ML injection Inject into the muscle once. One injection each buttock over 1-2 minutes. Warm prior to use.   HYDROcodone-acetaminophen (NORCO/VICODIN) 5-325 MG tablet Take 1-2 tablets by mouth every 6 (six) hours as needed for moderate pain.   levothyroxine (SYNTHROID, LEVOTHROID) 50 MCG tablet Take 50 mcg by mouth daily before breakfast.    Magnesium Oxide 500 MG CAPS Take by mouth.   metoprolol succinate (TOPROL-XL) 25 MG 24 hr tablet Take 25 mg by mouth daily.  Misc Natural Products (NEURIVA PO) Take 1 capsule by mouth daily. Patient has not been taking this medication lately   nystatin (MYCOSTATIN) 100000 UNIT/ML suspension Take 5 mLs (500,000 Units total) by mouth 4 (four) times daily.   OVER THE COUNTER MEDICATION Take 2 tablets by mouth daily. Bladder Control 360   polycarbophil (FIBERCON) 625 MG tablet Take 625 mg by mouth daily.   Probiotic Product (ALIGN PO) Take 1 capsule by mouth daily.   RESTORA RX 60-1.25 MG CAPS Take 1 capsule by mouth daily.   No current facility-administered medications for this visit. (Other)      REVIEW OF SYSTEMS:    ALLERGIES Allergies  Allergen Reactions   Betadine [Povidone Iodine] Other  (See Comments)    Blisters    Latex Hives and Swelling   Zestril [Lisinopril] Cough    PAST MEDICAL HISTORY Past Medical History:  Diagnosis Date   Aortic stenosis, severe    a. 07/2017: s/p TAVR w/ an Edwards Sapien 3 THV (size 26 mm, model # [id number], serial # [id number])   CAD (coronary artery disease)    a. 2010: s/p stent to RCA   Chronic kidney disease, stage III (moderate) (HCC)    Chronic sinusitis    Depressive disorder    History of breast cancer    a. s/p lumpectomies and XRT   Hyperlipemia    Hypertension    Hypothyroid    Insomnia    Memory loss    Osteoarthritis    Reflux esophagitis    Past Surgical History:  Procedure Laterality Date   ANGIOPLASTY     BIOPSY  10/21/2020   Procedure: BIOPSY;  Surgeon: Carol Ada, MD;  Location: [facility];  Service: Endoscopy;;   BREAST LUMPECTOMY     x2   ESOPHAGOGASTRODUODENOSCOPY Left 10/21/2020   Procedure: ESOPHAGOGASTRODUODENOSCOPY (EGD);  Surgeon: Carol Ada, MD;  Location: [facility];  Service: Endoscopy;  Laterality: Left;  Abnormal barium swallow   FOOT TENDON SURGERY     GALLBLADDER SURGERY     INCISIONAL HERNIA REPAIR N/A 03/01/2019   Procedure: OPEN INCISIONAL HERNIA REPAIR;  Surgeon: Armandina Gemma, MD;  Location: [facility];  Service: General;  Laterality: N/A;   INSERTION OF MESH N/A 03/01/2019   Procedure: INSERTION OF MESH;  Surgeon: Armandina Gemma, MD;  Location: [facility];  Service: General;  Laterality: N/A;   INTRAOPERATIVE TRANSTHORACIC ECHOCARDIOGRAM N/A 08/02/2017   Procedure: INTRAOPERATIVE TRANSTHORACIC ECHOCARDIOGRAM;  Surgeon: Burnell Blanks, MD;  Location: [facility];  Service: Open Heart Surgery;  Laterality: N/A;   REPLACEMENT TOTAL KNEE BILATERAL     RIGHT/LEFT HEART CATH AND CORONARY ANGIOGRAPHY N/A 07/05/2017   Procedure: RIGHT/LEFT HEART CATH AND CORONARY ANGIOGRAPHY;  Surgeon: Belva Crome, MD;  Location: [facility];  Service: Cardiovascular;  Laterality: N/A;   SKIN CANCER  EXCISION  2018   right nostril    TOTAL ABDOMINAL HYSTERECTOMY     TRANSCATHETER AORTIC VALVE REPLACEMENT, TRANSFEMORAL N/A 08/02/2017   Procedure: TRANSCATHETER AORTIC VALVE REPLACEMENT, TRANSFEMORAL;  Surgeon: Burnell Blanks, MD;  Location: [facility];  Service: Open Heart Surgery;  Laterality: N/A;    FAMILY HISTORY Family History  Problem Relation Age of Onset   Heart disease Father    Stroke Mother    Heart disease Maternal Grandfather    Breast cancer Sister     SOCIAL HISTORY Social History   Tobacco Use   Smoking status: Former    Packs/day: 0.50    Years: 20.00  Pack years: 10.00    Types: Cigarettes    Quit date: 03/15/1964    Years since quitting: 56.9   Smokeless tobacco: Never  Vaping Use   Vaping Use: Never used  Substance Use Topics   Alcohol use: Yes    Comment: social   Drug use: No         OPHTHALMIC EXAM:  Base Eye Exam     Visual Acuity (Snellen - Linear)       Right Left   Dist [place] 20/30 +2 20/60   Dist ph [place] NI NI         Tonometry (Tonopen, 8:12 AM)       Right Left   Pressure 11 07         Pupils       Dark Light Shape React APD   Right 2 2 Round Minimal None   Left 6 6 Round Dilated          Neuro/Psych     Oriented x3: Yes   Mood/Affect: Normal         Dilation     Left eye: 1.0% Mydriacyl, 2.5% Phenylephrine @ 8:11 AM           Slit Lamp and Fundus Exam     External Exam       Right Left   External Normal Normal         Slit Lamp Exam       Right Left   Lids/Lashes Normal Normal   Conjunctiva/Sclera White and quiet White and quiet   Cornea Clear Clear   Anterior Chamber Deep and quiet Deep and quiet   Iris Round and reactive Round and reactive   Lens Clear Clear   Anterior Vitreous Normal Normal         Fundus Exam       Right Left   Posterior Vitreous  Clear, avitric, broad sheet of mid vitreous and anterior vitreous debris resolved   Disc  Normal   C/D Ratio  0.55    Macula  Normal   Vessels  Normal   Periphery  Normal, no holes or tears            IMAGING AND PROCEDURES  Imaging and Procedures for 02/12/21           ASSESSMENT/PLAN:  Vitreous membranes and strands Postop day #1 looks great with clear vitreous     ICD-10-CM   1. 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  OS, postop day #1 looks great post vitrectomy for membranes and strands in the anterior vitreous in the visual axis and anteriorly near the nodal point of the 2.  Patient to commence with topical eye medications  3.  Patient instructed to use nightly still patch for several nights ,, will be released to do full activity in 10 days  Ophthalmic Meds Ordered this visit:  No orders of the defined types were placed in this encounter.      Return in about 1 week (around 02/19/2021) for dilate, COLOR FP, OCT.  Patient Instructions  Ofloxacin  4 times daily to the operative eye  Prednisolone acetate 1 drop to the operative eye 4 times daily  Patient instructed not to refill the medications and use them for maximum of 3 weeks.  Patient instructed do not rub the eye.  Patient has the option to use the patch at night.   No lifting and  bending for 1 week. No water IN the eye for 10 days. Do not rub the eye. Wear shield at night for 1-3 days.  Continue your topical medications for a total of 3 weeks.  Do not refill your postoperative medications unless instructed.  Refrain from exercise or intentional activity which increases our heart rate above resting levels.  Normal walking to complete normal activities of your day are appropriate.  Driving:  Legally, you only need one good eye, of 20/40 or better to drive.  However, the practice does not recommend driving during first weeks after surgery, IF you are uncomfortable with your visual functioning or capabilities.   If you have known sleep apnea, wear your CPAP as you normally should.    Explained  the diagnoses, plan, and follow up with the patient and they expressed understanding.  Patient expressed understanding of the importance of proper follow up care.   Clent Demark Jesyca Weisenburger M.D. Diseases & Surgery of the Retina and Vitreous Retina & Diabetic [place] 02/12/21     Abbreviations: M myopia (nearsighted); A astigmatism; H hyperopia (farsighted); P presbyopia; Mrx spectacle prescription;  CTL contact lenses; OD right eye; OS left eye; OU both eyes  XT exotropia; ET esotropia; PEK punctate epithelial keratitis; PEE punctate epithelial erosions; DES dry eye syndrome; MGD meibomian gland dysfunction; ATs artificial tears; PFAT's preservative free artificial tears; [place] nuclear sclerotic cataract; PSC posterior subcapsular cataract; ERM epi-retinal membrane; PVD posterior vitreous detachment; RD retinal detachment; DM diabetes mellitus; DR diabetic retinopathy; NPDR non-proliferative diabetic retinopathy; PDR proliferative diabetic retinopathy; CSME clinically significant macular edema; DME diabetic macular edema; dbh dot blot hemorrhages; CWS cotton wool spot; POAG primary open angle glaucoma; C/D cup-to-disc ratio; HVF humphrey visual field; GVF goldmann visual field; OCT optical coherence tomography; IOP intraocular pressure; BRVO Branch retinal vein occlusion; CRVO central retinal vein occlusion; CRAO central retinal artery occlusion; BRAO branch retinal artery occlusion; RT retinal tear; SB scleral buckle; PPV pars plana vitrectomy; VH Vitreous hemorrhage; PRP panretinal laser photocoagulation; IVK intravitreal kenalog; VMT vitreomacular traction; MH Macular hole;  NVD neovascularization of the disc; NVE neovascularization elsewhere; AREDS age related eye disease study; ARMD age related macular degeneration; POAG primary open angle glaucoma; EBMD epithelial/anterior basement membrane dystrophy; ACIOL anterior chamber intraocular lens; IOL intraocular lens; PCIOL posterior chamber intraocular lens;  Phaco/IOL phacoemulsification with intraocular lens placement; [place] photorefractive keratectomy; LASIK laser assisted in situ keratomileusis; HTN hypertension; DM diabetes mellitus; COPD chronic obstructive pulmonary disease

## 2021-02-18 ENCOUNTER — Other Ambulatory Visit: Payer: Self-pay

## 2021-02-18 ENCOUNTER — Ambulatory Visit (INDEPENDENT_AMBULATORY_CARE_PROVIDER_SITE_OTHER): Payer: Medicare Other | Admitting: Ophthalmology

## 2021-02-18 ENCOUNTER — Encounter (INDEPENDENT_AMBULATORY_CARE_PROVIDER_SITE_OTHER): Payer: Self-pay | Admitting: Ophthalmology

## 2021-02-18 DIAGNOSIS — H43312 Vitreous membranes and strands, left eye: Secondary | ICD-10-CM

## 2021-02-18 NOTE — Assessment & Plan Note (Signed)
OS, broad sheet of vitreous membranes and strands are resolved, patient very happy as mild postop vitreous hemorrhage is cleared.

## 2021-02-18 NOTE — Progress Notes (Signed)
02/18/2021     CHIEF COMPLAINT Patient presents for  Chief Complaint  Patient presents with   Post-op Follow-up      HISTORY OF PRESENT ILLNESS: [name] is a 87 y.o. female who presents to the clinic today for:   HPI     Post-op Follow-up           Laterality: left eye   Discomfort: Negative for pain, itching and foreign body sensation   Vision: is improved         Comments   1 week for dilate, color FP, OCT.  1 week post vitrectomy for broad vitreous membranes and strands, with some early postop hemorrhage, Patient has noticed a nice improvement in the vision of the last 1 day    Pt states vision has improved since last visit. Pt states "I noticed the difference yesterday." Denies new FOL or floaters. Prednisolone and ofloxacin qid OS.      Last edited by Hurman Horn, MD on 02/18/2021 10:03 AM.      Referring physician: Noralyn Pick, MD [address] [place],  [place] [address]  HISTORICAL INFORMATION:   Selected notes from the MEDICAL RECORD NUMBER    Lab Results  Component Value Date   HGBA1C 5.3 07/29/2017     CURRENT MEDICATIONS: No current outpatient medications on file. (Ophthalmic Drugs)   No current facility-administered medications for this visit. (Ophthalmic Drugs)   Current Outpatient Medications (Other)  Medication Sig   acetaminophen (TYLENOL) 500 MG tablet Take 1,000 mg by mouth daily as needed for mild pain or headache.   acetaminophen-codeine (TYLENOL #3) 300-30 MG tablet Take 1 tablet by mouth 2 (two) times daily as needed.   amLODipine (NORVASC) 2.5 MG tablet TAKE ONE (1) TABLET BY MOUTH ONCE DAILY   Ascorbic Acid (VITAMIN C) 1000 MG tablet Take 1,000 mg by mouth daily.   atorvastatin (LIPITOR) 10 MG tablet Take 10 mg by mouth daily.   Azelastine-Fluticasone 137-50 MCG/ACT SUSP Place 1 spray into both nostrils 2 (two) times daily as needed (congestion).   buPROPion (WELLBUTRIN XL) 150 MG 24  hr tablet Take 150 mg by mouth every morning.   busPIRone (BUSPAR) 5 MG tablet Take 5 mg by mouth 3 (three) times daily as needed (anxiety).   cholecalciferol (VITAMIN D3) 25 MCG (1000 UT) tablet Take 1,000 Units by mouth daily.   cloNIDine (CATAPRES) 0.1 MG tablet Take 0.1 mg by mouth 2 (two) times daily.   diclofenac (VOLTAREN) 25 MG EC tablet Take 25 mg by mouth 2 (two) times daily as needed.   diphenoxylate-atropine (LOMOTIL) 2.5-0.025 MG tablet Take 1 tablet by mouth 4 (four) times daily as needed for diarrhea or loose stools.   eszopiclone (LUNESTA) 1 MG TABS tablet Take 1-2 mg by mouth at bedtime.   fluconazole (DIFLUCAN) 200 MG tablet Take 200 mg by mouth daily.   fluticasone (FLONASE) 50 MCG/ACT nasal spray Place 2 sprays into both nostrils daily as needed for allergies or rhinitis.   fulvestrant (FASLODEX) 250 MG/5ML injection Inject into the muscle once. One injection each buttock over 1-2 minutes. Warm prior to use.   HYDROcodone-acetaminophen (NORCO/VICODIN) 5-325 MG tablet Take 1-2 tablets by mouth every 6 (six) hours as needed for moderate pain.   levothyroxine (SYNTHROID, LEVOTHROID) 50 MCG tablet Take 50 mcg by mouth daily before breakfast.    Magnesium Oxide 500 MG CAPS Take by mouth.   metoprolol succinate (TOPROL-XL) 25 MG 24 hr  tablet Take 25 mg by mouth daily.    Misc Natural Products (NEURIVA PO) Take 1 capsule by mouth daily. Patient has not been taking this medication lately   nystatin (MYCOSTATIN) 100000 UNIT/ML suspension Take 5 mLs (500,000 Units total) by mouth 4 (four) times daily.   OVER THE COUNTER MEDICATION Take 2 tablets by mouth daily. Bladder Control 360   polycarbophil (FIBERCON) 625 MG tablet Take 625 mg by mouth daily.   Probiotic Product (ALIGN PO) Take 1 capsule by mouth daily.   RESTORA RX 60-1.25 MG CAPS Take 1 capsule by mouth daily.   No current facility-administered medications for this visit. (Other)      REVIEW OF  SYSTEMS:    ALLERGIES Allergies  Allergen Reactions   Betadine [Povidone Iodine] Other (See Comments)    Blisters    Latex Hives and Swelling   Zestril [Lisinopril] Cough    PAST MEDICAL HISTORY Past Medical History:  Diagnosis Date   Aortic stenosis, severe    a. 07/2017: s/p TAVR w/ an Edwards Sapien 3 THV (size 26 mm, model # [id number], serial # [id number])   CAD (coronary artery disease)    a. 2010: s/p stent to RCA   Chronic kidney disease, stage III (moderate) (HCC)    Chronic sinusitis    Depressive disorder    History of breast cancer    a. s/p lumpectomies and XRT   Hyperlipemia    Hypertension    Hypothyroid    Insomnia    Memory loss    Osteoarthritis    Reflux esophagitis    Past Surgical History:  Procedure Laterality Date   ANGIOPLASTY     BIOPSY  10/21/2020   Procedure: BIOPSY;  Surgeon: Carol Ada, MD;  Location: [facility];  Service: Endoscopy;;   BREAST LUMPECTOMY     x2   ESOPHAGOGASTRODUODENOSCOPY Left 10/21/2020   Procedure: ESOPHAGOGASTRODUODENOSCOPY (EGD);  Surgeon: Carol Ada, MD;  Location: [facility];  Service: Endoscopy;  Laterality: Left;  Abnormal barium swallow   FOOT TENDON SURGERY     GALLBLADDER SURGERY     INCISIONAL HERNIA REPAIR N/A 03/01/2019   Procedure: OPEN INCISIONAL HERNIA REPAIR;  Surgeon: Armandina Gemma, MD;  Location: [facility];  Service: General;  Laterality: N/A;   INSERTION OF MESH N/A 03/01/2019   Procedure: INSERTION OF MESH;  Surgeon: Armandina Gemma, MD;  Location: [facility];  Service: General;  Laterality: N/A;   INTRAOPERATIVE TRANSTHORACIC ECHOCARDIOGRAM N/A 08/02/2017   Procedure: INTRAOPERATIVE TRANSTHORACIC ECHOCARDIOGRAM;  Surgeon: Burnell Blanks, MD;  Location: [facility];  Service: Open Heart Surgery;  Laterality: N/A;   REPLACEMENT TOTAL KNEE BILATERAL     RIGHT/LEFT HEART CATH AND CORONARY ANGIOGRAPHY N/A 07/05/2017   Procedure: RIGHT/LEFT HEART CATH AND CORONARY ANGIOGRAPHY;  Surgeon: Belva Crome, MD;   Location: [facility];  Service: Cardiovascular;  Laterality: N/A;   SKIN CANCER EXCISION  2018   right nostril    TOTAL ABDOMINAL HYSTERECTOMY     TRANSCATHETER AORTIC VALVE REPLACEMENT, TRANSFEMORAL N/A 08/02/2017   Procedure: TRANSCATHETER AORTIC VALVE REPLACEMENT, TRANSFEMORAL;  Surgeon: Burnell Blanks, MD;  Location: [facility];  Service: Open Heart Surgery;  Laterality: N/A;    FAMILY HISTORY Family History  Problem Relation Age of Onset   Heart disease Father    Stroke Mother    Heart disease Maternal Grandfather    Breast cancer Sister     SOCIAL HISTORY Social History   Tobacco Use   Smoking status: Former  Packs/day: 0.50    Years: 20.00    Pack years: 10.00    Types: Cigarettes    Quit date: 03/15/1964    Years since quitting: 57.0   Smokeless tobacco: Never  Vaping Use   Vaping Use: Never used  Substance Use Topics   Alcohol use: Yes    Comment: social   Drug use: No         OPHTHALMIC EXAM:  Base Eye Exam     Visual Acuity (ETDRS)       Right Left   Dist cc 20/20 20/40   Dist ph cc  NI    Correction: Glasses         Tonometry (Tonopen, 8:49 AM)       Right Left   Pressure 11 9         Pupils       Pupils Dark Light APD   Right PERRL 3 2 None   Left PERRL 3 2 None         Extraocular Movement       Right Left    Full Full         Neuro/Psych     Oriented x3: Yes   Mood/Affect: Normal         Dilation     Left eye: 1.0% Mydriacyl, 2.5% Phenylephrine @ 8:49 AM           Slit Lamp and Fundus Exam     External Exam       Right Left   External Normal Normal         Slit Lamp Exam       Right Left   Lids/Lashes Normal Normal   Conjunctiva/Sclera White and quiet White and quiet   Cornea Clear Clear   Anterior Chamber Deep and quiet Deep and quiet   Iris Round and reactive Round and reactive   Lens Clear Clear   Anterior Vitreous Normal Normal         Fundus Exam       Right Left    Posterior Vitreous  Clear, avitric, broad sheet of mid vitreous and anterior vitreous debris resolved   Disc  Normal   C/D Ratio  0.55   Macula  Normal   Vessels  Normal   Periphery  Normal, no holes or tears            IMAGING AND PROCEDURES  Imaging and Procedures for 03/02/21  Color Fundus Photography Optos - OU - Both Eyes       Right Eye Disc findings include normal observations. Macula : normal observations. Vessels : normal observations. Periphery : normal observations.   Left Eye Progression has no prior data. Disc findings include normal observations. Macula : normal observations. Vessels : normal observations. Periphery : normal observations.   Notes Clear visual axis OS, post vitrectomy for large membrane strand as seen on red free and Color photos OS      OCT, Retina - OU - Both Eyes       Right Eye Quality was good. Scan locations included subfoveal. Central Foveal Thickness: 301. Progression has no prior data. Findings include retinal drusen , abnormal foveal contour.   Left Eye Quality was good. Scan locations included subfoveal. Central Foveal Thickness: 314. Progression has no prior data. Findings include abnormal foveal contour, retinal drusen .   Notes No vitreous debris             ASSESSMENT/PLAN:  Vitreous membranes and  strands OS, broad sheet of vitreous membranes and strands are resolved, patient very happy as mild postop vitreous hemorrhage is cleared.       ICD-10-CM   1. 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  1 week postop vitrectomy for vitreous membranes and strands.  Now with nice improvement in acuity OS.  2.  Patient will complete her topical medications over the next 7 days  3.  Patient to resume full activity in 3 days  Ophthalmic Meds Ordered this visit:  No orders of the defined types were placed in this  encounter.       Return in about 4 months (around 06/19/2021) for DILATE OU, OCT.  Patient Instructions  Patient to complete topical eye medications to the left eye postoperatively over the next 7 days.  Patient is referred return to full activity in 3 more days asked not to compress mash or rub the eye   Explained the diagnoses, plan, and follow up with the patient and they expressed understanding.  Patient expressed understanding of the importance of proper follow up care.   Clent Demark Mekaela Azizi M.D. Diseases & Surgery of the Retina and Vitreous Retina & Diabetic [place] 03/02/21     Abbreviations: M myopia (nearsighted); A astigmatism; H hyperopia (farsighted); P presbyopia; Mrx spectacle prescription;  CTL contact lenses; OD right eye; OS left eye; OU both eyes  XT exotropia; ET esotropia; PEK punctate epithelial keratitis; PEE punctate epithelial erosions; DES dry eye syndrome; MGD meibomian gland dysfunction; ATs artificial tears; PFAT's preservative free artificial tears; [place] nuclear sclerotic cataract; PSC posterior subcapsular cataract; ERM epi-retinal membrane; PVD posterior vitreous detachment; RD retinal detachment; DM diabetes mellitus; DR diabetic retinopathy; NPDR non-proliferative diabetic retinopathy; PDR proliferative diabetic retinopathy; CSME clinically significant macular edema; DME diabetic macular edema; dbh dot blot hemorrhages; CWS cotton wool spot; POAG primary open angle glaucoma; C/D cup-to-disc ratio; HVF humphrey visual field; GVF goldmann visual field; OCT optical coherence tomography; IOP intraocular pressure; BRVO Branch retinal vein occlusion; CRVO central retinal vein occlusion; CRAO central retinal artery occlusion; BRAO branch retinal artery occlusion; RT retinal tear; SB scleral buckle; PPV pars plana vitrectomy; VH Vitreous hemorrhage; PRP panretinal laser photocoagulation; IVK intravitreal kenalog; VMT vitreomacular traction; MH Macular hole;  NVD  neovascularization of the disc; NVE neovascularization elsewhere; AREDS age related eye disease study; ARMD age related macular degeneration; POAG primary open angle glaucoma; EBMD epithelial/anterior basement membrane dystrophy; ACIOL anterior chamber intraocular lens; IOL intraocular lens; PCIOL posterior chamber intraocular lens; Phaco/IOL phacoemulsification with intraocular lens placement; [place] photorefractive keratectomy; LASIK laser assisted in situ keratomileusis; HTN hypertension; DM diabetes mellitus; COPD chronic obstructive pulmonary disease

## 2021-02-18 NOTE — Patient Instructions (Signed)
Patient to complete topical eye medications to the left eye postoperatively over the next 7 days.  Patient is referred return to full activity in 3 more days asked not to compress mash or rub the eye

## 2021-03-04 NOTE — Progress Notes (Signed)
[name]  [address] [place],    [address] [phone]  Clinic Day:  03/11/2021  Referring physician: Ernestene Kiel, MD   This document serves as a record of services personally performed by [name] Poisson, MD. It was created on their behalf by [name],[name], a trained medical scribe. The creation of this record is based on the scribe's personal observations and the provider's statements to them.  CHIEF COMPLAINT:  CC: History of multiple breast cancers  Current Treatment:  Monthly fulvestrant injections   HISTORY OF PRESENT ILLNESS:  [name] is a 87 y.o. female with a history of multiple breast cancers.  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  Pathology revealed a 0.7 cm lesion which was estrogen and progesterone receptor positive.  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  Estrogen and progesterone receptors were positive.  She received adjuvant radiation.  She was placed on tamoxifen, but stopped it on her own within a few months.  In December 2014, she was found to have a new lesion of the left breast found on routine mammogram and associated with retraction of the left nipple.  Initially, it measured 1.9 cm with an enlarged left axillary node, but slowly enlarged over the next few months, up to 3 cm.  Biopsy confirmed invasive mammary carcinoma with lobular features.  Estrogen and progesterone receptors were positive with her 2 neu negative.  Ki 67 was 34%.  CT scans were negative for metastatic disease.  She has many comorbidities including significant cardiac disease, so declined surgery.  She was placed on hormonal therapy with letrozole 2.5 mg daily in 2015.  She had a good response to this, with control of her disease.  Bilateral mammogram and left breast  ultrasound in February of 2017 showed a persistent node, which was down to 5-6 mm and the subareolar left breast calcifications were stable, with no evidence of definite mass.  She also had a bone density at that time, which continues to show osteopenia.  She stopped the letrozole in March of 2017, because of hot flashes, with improvement of this.  However, she had progressive disease while off  treatment with a recurrent central left breast mass.  She was then placed on fulvestrant injections in May of 2017, and has tolerated these quite well.  She has had a slow decrease in her mass since that time. When I examined her last year, I felt a nodular masslike effect in the epigastrium and so wanted to recheck this.  I brought her back later to recheck it, and it felt soft, like a lipoma.  She eventually had ultrasound imaging and this was found to be a small epigastric ventral hernia containing omental fat.  She had been having diarrhea and Dr. Melina Copa saw her in November but did not recommend colonoscopy because of her age and comorbidities.  She was having increased dyspnea with exertion and was occasionally feeling a tight feeling in her throat.  We referred her back to her cardiologist, Dr. Daneen Schick.  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  She is on liquid B12 supplement and her appetite is good.  She also had an ultrasound of her thyroid in February of 2019 to follow up  on some nodules, and the nodules were stable to decreased in size.  Bone density scan from June 2021 revealed osteoporosis with a T-score of -3.2 of the left forearm, previously -1.8.  The right femur measures -2.2, previously -1.8, and is considered osteopenic.  Dual femur total mean measures -1.7, previously -1.2.    She has been quite ill with diarrhea and requiring hospitalization for 4 days in August 2022 at [facility].  She missed 1 fulvestrant injection during August  and again in September. She is struggling to keep her appointments and keep up with her medications but does have help at home.  She has mild anemia which fluctuates up and down.  INTERVAL HISTORY:  [name] is here for routine follow up prior to her next fulvestrant. She states that she is doing fairly well other than being chilled.  She continues to use her walker to ambulate. She reports vision changes, and continues to follow with her ophthalmologist. She continues to reside at [place]. Hemoglobin is stable to mildly worse at 11.6, and white count and platelets are normal. Chemistries are unremarkable except for a BUN of 18, and a creatinine of 1.1, previously 1.0.  Her  appetite is good, and she has gained 2 and 1/2 pounds since her last visit.  She denies fever or other signs of infection.  She denies nausea, vomiting, bowel issues, or abdominal pain.  She denies sore throat, cough, dyspnea, or chest pain.  REVIEW OF SYSTEMS:  Review of Systems  Constitutional:  Positive for chills. Negative for appetite change, fatigue, fever and unexpected weight change.  Eyes:  Positive for eye problems (vision problems).  Respiratory: Negative.  Negative for chest tightness, cough, hemoptysis, shortness of breath and wheezing.   Cardiovascular: Negative.  Negative for chest pain, leg swelling and palpitations.  Gastrointestinal:  Negative for abdominal distention, abdominal pain, blood in stool, constipation, diarrhea, nausea and vomiting.  Endocrine: Negative.   Genitourinary: Negative.  Negative for difficulty urinating, dysuria, frequency and hematuria.   Musculoskeletal:  Positive for gait problem (uses a walker). Negative for arthralgias, back pain, flank pain and myalgias.  Skin: Negative.   Neurological:  Positive for gait problem (uses a walker). Negative for dizziness, extremity weakness, headaches, light-headedness, numbness, seizures and speech difficulty.  Hematological: Negative.    Psychiatric/Behavioral: Negative.  Negative for depression and sleep disturbance. The patient is not nervous/anxious.     VITALS:  Blood pressure 122/78, pulse (!) 59, temperature 98.7 F (37.1 C), temperature source Oral, resp. rate 20, height 4\' 11"  (1.499 m), weight 154 lb 3.2 oz (69.9 kg), SpO2 96 %.  Wt Readings from Last 3 Encounters:  03/11/21 154 lb 3.2 oz (69.9 kg)  02/09/21 153 lb 4 oz (69.5 kg)  01/14/21 153 lb 4 oz (69.5 kg)    Body mass index is 31.14 kg/m.  Performance status (ECOG): 1 - Symptomatic but completely ambulatory  PHYSICAL EXAM:  Physical Exam Constitutional:      General: She is not in acute distress.    Appearance: Normal appearance. She is normal weight.  HENT:     Head: Normocephalic and atraumatic.  Eyes:     General: No scleral icterus.    Extraocular Movements: Extraocular movements intact.     Conjunctiva/sclera: Conjunctivae normal.     Pupils: Pupils are equal, round, and reactive to light.  Cardiovascular:     Rate and Rhythm: Regular rhythm. Bradycardia present.     Pulses: Normal pulses.     Heart sounds:  Murmur heard.  Systolic murmur is present with a grade of 2/6.    No friction rub. No gallop.  Pulmonary:     Effort: Pulmonary effort is normal. No respiratory distress.     Breath sounds: Normal breath sounds.  Chest:     Comments: Central firmness of the left breast. Right breast is without masses. Abdominal:     General: Bowel sounds are normal. There is no distension.     Palpations: Abdomen is soft. There is no hepatomegaly, splenomegaly or mass.     Tenderness: There is no abdominal tenderness.  Musculoskeletal:        General: Normal range of motion.     Cervical back: Normal range of motion and neck supple.     Right lower leg: Edema (mild) present.     Left lower leg: Edema (mild) present.  Lymphadenopathy:     Cervical: No cervical adenopathy.  Skin:    General: Skin is warm and dry.  Neurological:     General:  No focal deficit present.     Mental Status: She is alert and oriented to person, place, and time. Mental status is at baseline.  Psychiatric:        Mood and Affect: Mood normal.        Behavior: Behavior normal.        Thought Content: Thought content normal.        Judgment: Judgment normal.    LABS:   CBC Latest Ref Rng & Units 03/11/2021 01/14/2021 12/11/2020  WBC - 5.7 6.0 5.8  Hemoglobin 12.0 - 16.0 11.6(A) 11.9(A) 11.4(A)  Hematocrit 36 - 46 35(A) 36 36  Platelets 150 - 399 214 218 214   CMP Latest Ref Rng & Units 03/11/2021 01/14/2021 12/11/2020  Glucose 70 - 99 mg/dL - - -  BUN 4 - 21 18 12 8   Creatinine 0.5 - 1.1 1.1 1.0 1.0  Sodium 137 - 147 137 139 138  Potassium 3.4 - 5.3 3.9 4.0 3.4  Chloride 99 - 108 106 107 106  CO2 13 - 22 25(A) 25(A) 24(A)  Calcium 8.7 - 10.7 8.8 9.1 9.1  Total Protein 6.5 - 8.1 g/dL - - -  Total Bilirubin 0.3 - 1.2 mg/dL - - -  Alkaline Phos 25 - 125 47 45 50  AST 13 - 35 24 25 22   ALT 7 - 35 18 13 11     STUDIES:  Color Fundus Photography Optos - OU - Both Eyes  Result Date: 02/18/2021 Right Eye Disc findings include normal observations. Macula : normal observations. Vessels : normal observations. Periphery : normal observations. Left Eye Progression has no prior data. Disc findings include normal observations. Macula : normal observations. Vessels : normal observations. Periphery : normal observations. Notes Clear visual axis OS, post vitrectomy for large membrane strand as seen on red free and Color photos OS     Allergies:  Allergies  Allergen Reactions   Betadine [Povidone Iodine] Other (See Comments)    Blisters    Latex Hives and Swelling   Zestril [Lisinopril] Cough    Current Medications: Current Outpatient Medications  Medication Sig Dispense Refill   acetaminophen (TYLENOL) 500 MG tablet Take 1,000 mg by mouth daily as needed for mild pain or headache.     acetaminophen-codeine (TYLENOL #3) 300-30 MG tablet Take 1 tablet  by mouth 2 (two) times daily as needed.     amLODipine (NORVASC) 2.5 MG tablet TAKE ONE (1) TABLET BY MOUTH ONCE DAILY  30 tablet 3   Ascorbic Acid (VITAMIN C) 1000 MG tablet Take 1,000 mg by mouth daily.     atorvastatin (LIPITOR) 10 MG tablet Take 10 mg by mouth daily.     Azelastine-Fluticasone 137-50 MCG/ACT SUSP Place 1 spray into both nostrils 2 (two) times daily as needed (congestion).     buPROPion (WELLBUTRIN XL) 150 MG 24 hr tablet Take 150 mg by mouth every morning.     busPIRone (BUSPAR) 5 MG tablet Take 5 mg by mouth 3 (three) times daily as needed (anxiety).     cholecalciferol (VITAMIN D3) 25 MCG (1000 UT) tablet Take 1,000 Units by mouth daily.     cloNIDine (CATAPRES) 0.1 MG tablet Take 0.1 mg by mouth 2 (two) times daily.     diclofenac (VOLTAREN) 25 MG EC tablet Take 25 mg by mouth 2 (two) times daily as needed.     diphenoxylate-atropine (LOMOTIL) 2.5-0.025 MG tablet Take 1 tablet by mouth 4 (four) times daily as needed for diarrhea or loose stools. 30 tablet 0   eszopiclone (LUNESTA) 1 MG TABS tablet Take 1-2 mg by mouth at bedtime.     fluconazole (DIFLUCAN) 200 MG tablet Take 200 mg by mouth daily.     fluticasone (FLONASE) 50 MCG/ACT nasal spray Place 2 sprays into both nostrils daily as needed for allergies or rhinitis.     fulvestrant (FASLODEX) 250 MG/5ML injection Inject into the muscle once. One injection each buttock over 1-2 minutes. Warm prior to use.     HYDROcodone-acetaminophen (NORCO/VICODIN) 5-325 MG tablet Take 1-2 tablets by mouth every 6 (six) hours as needed for moderate pain.     levothyroxine (SYNTHROID, LEVOTHROID) 50 MCG tablet Take 50 mcg by mouth daily before breakfast.      Magnesium Oxide 500 MG CAPS Take by mouth.     metoprolol succinate (TOPROL-XL) 25 MG 24 hr tablet Take 25 mg by mouth daily.      Misc Natural Products (NEURIVA PO) Take 1 capsule by mouth daily. Patient has not been taking this medication lately     nystatin (MYCOSTATIN) 100000  UNIT/ML suspension Take 5 mLs (500,000 Units total) by mouth 4 (four) times daily. 60 mL 0   OVER THE COUNTER MEDICATION Take 2 tablets by mouth daily. Bladder Control 360     polycarbophil (FIBERCON) 625 MG tablet Take 625 mg by mouth daily.     Probiotic Product (ALIGN PO) Take 1 capsule by mouth daily.     RESTORA RX 60-1.25 MG CAPS Take 1 capsule by mouth daily.     No current facility-administered medications for this visit.     ASSESSMENT & PLAN:   Assessment:   1. Stage 0 breast cancer April 2009 of the left breast treated with lumpectomy.  2. Stage I invasive ductal carcinoma of the right breast in 2010 treated with surgery and adjuvant radiation.  3. Stage IIB left breast cancer diagnosed in December of 2014 with a 1.9 cm lesion and a 3 cm left axillary node for a T2 N1 M0.  Once again this was estrogen and progesterone receptor positive but she had not taken hormonal therapy in the past.  She declined surgery because of her comorbidities and so was placed on hormonal therapy with letrozole in 2015 until March of 2017. At that time she stopped it because of severe hot flashes and was changed to fulvestrant injections monthly as of May of 2017. She has done very well with that.  Her left breast mass  had started to grow back in 2017, but is back under control, and I just palpate a central firmness of the left breast. However, she has missed some doses this summer.  4. Nodular mass effect in the epigastrium which is a small ventral hernia containing fat.  She underwent hernia repair in December 2020, but I can still feel a small nodule in the epigastrium on palpation.  5. Osteoporosis.  Bone density scan from June 2021 revealed severe worsening and she is now considered osteoporotic with a T-score of -3.2 of the left forearm.  The femur has also shown worsening but is still considered within the osteopenia range.   She will be due for repeat bone density scan in June 2023.  6. Status  post TAVR 2019, and doing extremely well.   7. Anemia, mild. We will check B12, folate and iron studies today, and will continue to monitor.   Plan: We will proceed with fulvestrant today and again in 4 weeks.  I will see her back in 8 weeks with CBC and CMP for repeat examination prior to her next fulvestrant.  She understands and agrees with this plan of care.  She knows to call if she needs to be seen sooner.   I provided 20 minutes of face-to-face time during this this encounter and > 50% was spent counseling as documented under my assessment and plan.    Derwood Kaplan, MD [facility] [address] [place] [address] Dept: [phone] Dept Fax: [phone]   I, Rita Ohara, am acting as scribe for Derwood Kaplan, MD  I have reviewed this report as typed by the medical scribe, and it is complete and accurate.

## 2021-03-11 ENCOUNTER — Encounter: Payer: Self-pay | Admitting: Oncology

## 2021-03-11 ENCOUNTER — Inpatient Hospital Stay: Payer: Medicare Other

## 2021-03-11 ENCOUNTER — Other Ambulatory Visit: Payer: Self-pay | Admitting: Oncology

## 2021-03-11 ENCOUNTER — Other Ambulatory Visit: Payer: Self-pay | Admitting: Hematology and Oncology

## 2021-03-11 ENCOUNTER — Inpatient Hospital Stay: Payer: Medicare Other | Attending: Oncology | Admitting: Oncology

## 2021-03-11 VITALS — BP 122/78 | HR 59 | Temp 98.7°F | Resp 20 | Ht 59.0 in | Wt 154.2 lb

## 2021-03-11 DIAGNOSIS — Z170 Estrogen receptor positive status [ER+]: Secondary | ICD-10-CM | POA: Diagnosis not present

## 2021-03-11 DIAGNOSIS — M810 Age-related osteoporosis without current pathological fracture: Secondary | ICD-10-CM | POA: Diagnosis not present

## 2021-03-11 DIAGNOSIS — D539 Nutritional anemia, unspecified: Secondary | ICD-10-CM

## 2021-03-11 DIAGNOSIS — D649 Anemia, unspecified: Secondary | ICD-10-CM | POA: Diagnosis not present

## 2021-03-11 DIAGNOSIS — C50112 Malignant neoplasm of central portion of left female breast: Secondary | ICD-10-CM

## 2021-03-11 DIAGNOSIS — C50912 Malignant neoplasm of unspecified site of left female breast: Secondary | ICD-10-CM | POA: Insufficient documentation

## 2021-03-11 DIAGNOSIS — C50411 Malignant neoplasm of upper-outer quadrant of right female breast: Secondary | ICD-10-CM | POA: Diagnosis not present

## 2021-03-11 HISTORY — DX: Nutritional anemia, unspecified: D53.9

## 2021-03-11 LAB — CBC: RBC: 3.93 (ref 3.87–5.11)

## 2021-03-11 LAB — BASIC METABOLIC PANEL
BUN: 18 (ref 4–21)
CO2: 25 — AB (ref 13–22)
Chloride: 106 (ref 99–108)
Creatinine: 1.1 (ref 0.5–1.1)
Glucose: 165
Potassium: 3.9 (ref 3.4–5.3)
Sodium: 137 (ref 137–147)

## 2021-03-11 LAB — HEPATIC FUNCTION PANEL
ALT: 18 (ref 7–35)
AST: 24 (ref 13–35)
Alkaline Phosphatase: 47 (ref 25–125)
Bilirubin, Total: 1.1

## 2021-03-11 LAB — FERRITIN: Ferritin: 27 ng/mL (ref 11–307)

## 2021-03-11 LAB — CBC AND DIFFERENTIAL
HCT: 35 — AB (ref 36–46)
Hemoglobin: 11.6 — AB (ref 12.0–16.0)
Neutrophils Absolute: 4.22
Platelets: 214 (ref 150–399)
WBC: 5.7

## 2021-03-11 LAB — IRON AND TIBC
Iron: 88 ug/dL (ref 28–170)
Saturation Ratios: 26 % (ref 10.4–31.8)
TIBC: 341 ug/dL (ref 250–450)
UIBC: 253 ug/dL

## 2021-03-11 LAB — FOLATE: Folate: 13.7 ng/mL (ref >5.9)

## 2021-03-11 LAB — COMPREHENSIVE METABOLIC PANEL
Albumin: 3.8 (ref 3.5–5.0)
Calcium: 8.8 (ref 8.7–10.7)

## 2021-03-11 LAB — VITAMIN B12: Vitamin B-12: 317 pg/mL (ref 180–914)

## 2021-03-12 ENCOUNTER — Other Ambulatory Visit: Payer: Self-pay

## 2021-03-12 ENCOUNTER — Inpatient Hospital Stay: Payer: Medicare Other

## 2021-03-12 VITALS — BP 162/72 | HR 62 | Temp 98.0°F | Resp 18 | Ht 59.0 in | Wt 156.5 lb

## 2021-03-12 DIAGNOSIS — C50912 Malignant neoplasm of unspecified site of left female breast: Secondary | ICD-10-CM | POA: Diagnosis not present

## 2021-03-12 DIAGNOSIS — Z170 Estrogen receptor positive status [ER+]: Secondary | ICD-10-CM

## 2021-03-12 MED ORDER — FULVESTRANT 250 MG/5ML IM SOSY
500.0000 mg | PREFILLED_SYRINGE | Freq: Once | INTRAMUSCULAR | Status: AC
  Administered 2021-03-12: 500 mg via INTRAMUSCULAR
  Filled 2021-03-12: qty 10

## 2021-03-12 NOTE — Patient Instructions (Signed)
Fulvestrant injection °What is this medication? °FULVESTRANT (ful VES trant) blocks the effects of estrogen. It is used to treat breast cancer. °This medicine may be used for other purposes; ask your health care provider or pharmacist if you have questions. °COMMON BRAND NAME(S): FASLODEX °What should I tell my care team before I take this medication? °They need to know if you have any of these conditions: °bleeding disorders °liver disease °low blood counts, like low white cell, platelet, or red cell counts °an unusual or allergic reaction to fulvestrant, other medicines, foods, dyes, or preservatives °pregnant or trying to get pregnant °breast-feeding °How should I use this medication? °This medicine is for injection into a muscle. It is usually given by a health care professional in a hospital or clinic setting. °Talk to your pediatrician regarding the use of this medicine in children. Special care may be needed. °Overdosage: If you think you have taken too much of this medicine contact a poison control center or emergency room at once. °NOTE: This medicine is only for you. Do not share this medicine with others. °What if I miss a dose? °It is important not to miss your dose. Call your doctor or health care professional if you are unable to keep an appointment. °What may interact with this medication? °medicines that treat or prevent blood clots like warfarin, enoxaparin, dalteparin, apixaban, dabigatran, and rivaroxaban °This list may not describe all possible interactions. Give your health care provider a list of all the medicines, herbs, non-prescription drugs, or dietary supplements you use. Also tell them if you smoke, drink alcohol, or use illegal drugs. Some items may interact with your medicine. °What should I watch for while using this medication? °Your condition will be monitored carefully while you are receiving this medicine. You will need important blood work done while you are taking this  medicine. °Do not become pregnant while taking this medicine or for at least 1 year after stopping it. Women of child-bearing potential will need to have a negative pregnancy test before starting this medicine. Women should inform their doctor if they wish to become pregnant or think they might be pregnant. There is a potential for serious side effects to an unborn child. Men should inform their doctors if they wish to father a child. This medicine may lower sperm counts. Talk to your health care professional or pharmacist for more information. Do not breast-feed an infant while taking this medicine or for 1 year after the last dose. °What side effects may I notice from receiving this medication? °Side effects that you should report to your doctor or health care professional as soon as possible: °allergic reactions like skin rash, itching or hives, swelling of the face, lips, or tongue °feeling faint or lightheaded, falls °pain, tingling, numbness, or weakness in the legs °signs and symptoms of infection like fever or chills; cough; flu-like symptoms; sore throat °vaginal bleeding °Side effects that usually do not require medical attention (report to your doctor or health care professional if they continue or are bothersome): °aches, pains °constipation °diarrhea °headache °hot flashes °nausea, vomiting °pain at site where injected °stomach pain °This list may not describe all possible side effects. Call your doctor for medical advice about side effects. You may report side effects to FDA at 1-800-FDA-1088. °Where should I keep my medication? °This drug is given in a hospital or clinic and will not be stored at home. °NOTE: This sheet is a summary. It may not cover all possible information. If you have   questions about this medicine, talk to your doctor, pharmacist, or health care provider. °© 2022 [employer] (2017-06-14 00:00:00) ° °

## 2021-03-16 ENCOUNTER — Encounter: Payer: Self-pay | Admitting: Oncology

## 2021-03-17 ENCOUNTER — Telehealth: Payer: Self-pay

## 2021-03-17 NOTE — Telephone Encounter (Signed)
-----   Message from Derwood Kaplan, MD sent at 03/16/2021  7:26 PM EST ----- Regarding: call Tell her vitamin levels are all okay

## 2021-03-19 ENCOUNTER — Encounter: Payer: Self-pay | Admitting: Oncology

## 2021-03-23 ENCOUNTER — Encounter: Payer: Self-pay | Admitting: Oncology

## 2021-04-01 ENCOUNTER — Other Ambulatory Visit: Payer: Self-pay | Admitting: Oncology

## 2021-04-09 ENCOUNTER — Ambulatory Visit: Payer: Medicare Other

## 2021-04-10 ENCOUNTER — Other Ambulatory Visit: Payer: Self-pay | Admitting: Pharmacist

## 2021-04-13 ENCOUNTER — Inpatient Hospital Stay: Payer: Medicare Other | Attending: Oncology

## 2021-04-13 ENCOUNTER — Other Ambulatory Visit: Payer: Self-pay

## 2021-04-13 VITALS — BP 136/82 | HR 76 | Temp 98.0°F | Resp 18 | Ht 59.0 in | Wt 151.0 lb

## 2021-04-13 DIAGNOSIS — C50912 Malignant neoplasm of unspecified site of left female breast: Secondary | ICD-10-CM | POA: Insufficient documentation

## 2021-04-13 DIAGNOSIS — C50112 Malignant neoplasm of central portion of left female breast: Secondary | ICD-10-CM

## 2021-04-13 DIAGNOSIS — Z170 Estrogen receptor positive status [ER+]: Secondary | ICD-10-CM | POA: Diagnosis not present

## 2021-04-13 MED ORDER — FULVESTRANT 250 MG/5ML IM SOSY
500.0000 mg | PREFILLED_SYRINGE | Freq: Once | INTRAMUSCULAR | Status: AC
  Administered 2021-04-13: 500 mg via INTRAMUSCULAR
  Filled 2021-04-13: qty 10

## 2021-04-13 NOTE — Patient Instructions (Signed)
Fulvestrant injection °What is this medication? °FULVESTRANT (ful VES trant) blocks the effects of estrogen. It is used to treat breast cancer. °This medicine may be used for other purposes; ask your health care provider or pharmacist if you have questions. °COMMON BRAND NAME(S): FASLODEX °What should I tell my care team before I take this medication? °They need to know if you have any of these conditions: °bleeding disorders °liver disease °low blood counts, like low white cell, platelet, or red cell counts °an unusual or allergic reaction to fulvestrant, other medicines, foods, dyes, or preservatives °pregnant or trying to get pregnant °breast-feeding °How should I use this medication? °This medicine is for injection into a muscle. It is usually given by a health care professional in a hospital or clinic setting. °Talk to your pediatrician regarding the use of this medicine in children. Special care may be needed. °Overdosage: If you think you have taken too much of this medicine contact a poison control center or emergency room at once. °NOTE: This medicine is only for you. Do not share this medicine with others. °What if I miss a dose? °It is important not to miss your dose. Call your doctor or health care professional if you are unable to keep an appointment. °What may interact with this medication? °medicines that treat or prevent blood clots like warfarin, enoxaparin, dalteparin, apixaban, dabigatran, and rivaroxaban °This list may not describe all possible interactions. Give your health care provider a list of all the medicines, herbs, non-prescription drugs, or dietary supplements you use. Also tell them if you smoke, drink alcohol, or use illegal drugs. Some items may interact with your medicine. °What should I watch for while using this medication? °Your condition will be monitored carefully while you are receiving this medicine. You will need important blood work done while you are taking this  medicine. °Do not become pregnant while taking this medicine or for at least 1 year after stopping it. Women of child-bearing potential will need to have a negative pregnancy test before starting this medicine. Women should inform their doctor if they wish to become pregnant or think they might be pregnant. There is a potential for serious side effects to an unborn child. Men should inform their doctors if they wish to father a child. This medicine may lower sperm counts. Talk to your health care professional or pharmacist for more information. Do not breast-feed an infant while taking this medicine or for 1 year after the last dose. °What side effects may I notice from receiving this medication? °Side effects that you should report to your doctor or health care professional as soon as possible: °allergic reactions like skin rash, itching or hives, swelling of the face, lips, or tongue °feeling faint or lightheaded, falls °pain, tingling, numbness, or weakness in the legs °signs and symptoms of infection like fever or chills; cough; flu-like symptoms; sore throat °vaginal bleeding °Side effects that usually do not require medical attention (report to your doctor or health care professional if they continue or are bothersome): °aches, pains °constipation °diarrhea °headache °hot flashes °nausea, vomiting °pain at site where injected °stomach pain °This list may not describe all possible side effects. Call your doctor for medical advice about side effects. You may report side effects to FDA at 1-800-FDA-1088. °Where should I keep my medication? °This drug is given in a hospital or clinic and will not be stored at home. °NOTE: This sheet is a summary. It may not cover all possible information. If you have   questions about this medicine, talk to your doctor, pharmacist, or health care provider. °© 2022 [employer] (2017-06-14 00:00:00) ° °

## 2021-04-28 ENCOUNTER — Telehealth: Payer: Self-pay

## 2021-05-06 ENCOUNTER — Ambulatory Visit: Payer: Medicare Other | Admitting: Hematology and Oncology

## 2021-05-06 ENCOUNTER — Other Ambulatory Visit: Payer: Self-pay

## 2021-05-06 ENCOUNTER — Ambulatory Visit: Payer: Medicare Other

## 2021-05-06 ENCOUNTER — Other Ambulatory Visit: Payer: Medicare Other

## 2021-05-11 ENCOUNTER — Other Ambulatory Visit: Payer: Medicare Other

## 2021-05-11 ENCOUNTER — Ambulatory Visit: Payer: Medicare Other

## 2021-05-11 ENCOUNTER — Ambulatory Visit: Payer: Medicare Other | Admitting: Hematology and Oncology

## 2021-05-11 ENCOUNTER — Ambulatory Visit: Payer: Medicare Other | Admitting: Oncology

## 2021-05-12 NOTE — Progress Notes (Signed)
Cardiology Office Note:    Date:  05/13/2021   ID:  [name], DOB 02-Jun-1925, MRN [id number]  PCP:  Ernestene Kiel, MD  Cardiologist:  [name], MD   Referring MD: Ernestene Kiel, MD   Chief Complaint  Patient presents with   Hyperlipidemia   Cardiac Valve Problem   Congestive Heart Failure    History of Present Illness:    [name] is a 88 y.o. female with a hx of CAD s/p PCI to RCA in 2010, CKD III, HLD, HTN, hypothyroidism, breast cancer s/p lumpectomy and radiation, chronic diastolic CHF, and severe AS s/p TAVR (08/02/17).   She is doing well.  She has been troubled by chronic diarrhea since last summer.  She has had some dehydration.  The initial blood pressure recording was 126/60.  I repeated it and it was 138/70.  Patient has no symptoms of lightheadedness or dizziness.  She would like to get off as much medication as possible.  She asked me to evaluate her current therapy to see if there are any possibilities for discontinuing medications.  Past Medical History:  Diagnosis Date   Aortic stenosis, severe    a. 07/2017: s/p TAVR w/ an Edwards Sapien 3 THV (size 26 mm, model # [id number], serial # [id number])   CAD (coronary artery disease)    a. 2010: s/p stent to RCA   Chronic kidney disease, stage III (moderate) (HCC)    Chronic sinusitis    Depressive disorder    History of breast cancer    a. s/p lumpectomies and XRT   Hyperlipemia    Hypertension    Hypothyroid    Insomnia    Memory loss    Osteoarthritis    Reflux esophagitis     Past Surgical History:  Procedure Laterality Date   ANGIOPLASTY     BIOPSY  10/21/2020   Procedure: BIOPSY;  Surgeon: Carol Ada, MD;  Location: [facility];  Service: Endoscopy;;   BREAST LUMPECTOMY     x2   ESOPHAGOGASTRODUODENOSCOPY Left 10/21/2020   Procedure: ESOPHAGOGASTRODUODENOSCOPY (EGD);  Surgeon: Carol Ada, MD;  Location: [facility];  Service: Endoscopy;   Laterality: Left;  Abnormal barium swallow   FOOT TENDON SURGERY     GALLBLADDER SURGERY     INCISIONAL HERNIA REPAIR N/A 03/01/2019   Procedure: OPEN INCISIONAL HERNIA REPAIR;  Surgeon: Armandina Gemma, MD;  Location: [facility];  Service: General;  Laterality: N/A;   INSERTION OF MESH N/A 03/01/2019   Procedure: INSERTION OF MESH;  Surgeon: Armandina Gemma, MD;  Location: [facility];  Service: General;  Laterality: N/A;   INTRAOPERATIVE TRANSTHORACIC ECHOCARDIOGRAM N/A 08/02/2017   Procedure: INTRAOPERATIVE TRANSTHORACIC ECHOCARDIOGRAM;  Surgeon: Burnell Blanks, MD;  Location: [facility];  Service: Open Heart Surgery;  Laterality: N/A;   REPLACEMENT TOTAL KNEE BILATERAL     RIGHT/LEFT HEART CATH AND CORONARY ANGIOGRAPHY N/A 07/05/2017   Procedure: RIGHT/LEFT HEART CATH AND CORONARY ANGIOGRAPHY;  Surgeon: Belva Crome, MD;  Location: [facility];  Service: Cardiovascular;  Laterality: N/A;   SKIN CANCER EXCISION  2018   right nostril    TOTAL ABDOMINAL HYSTERECTOMY     TRANSCATHETER AORTIC VALVE REPLACEMENT, TRANSFEMORAL N/A 08/02/2017   Procedure: TRANSCATHETER AORTIC VALVE REPLACEMENT, TRANSFEMORAL;  Surgeon: Burnell Blanks, MD;  Location: [facility];  Service: Open Heart Surgery;  Laterality: N/A;    Current Medications: Current Meds  Medication Sig   acetaminophen-codeine (TYLENOL #3) 300-30 MG tablet Take 1  tablet by mouth 2 (two) times daily as needed.   amLODipine (NORVASC) 2.5 MG tablet TAKE ONE (1) TABLET BY MOUTH ONCE DAILY   Ascorbic Acid (VITAMIN C) 1000 MG tablet Take 1,000 mg by mouth daily.   atorvastatin (LIPITOR) 10 MG tablet Take 10 mg by mouth daily.   budesonide (ENTOCORT EC) 3 MG 24 hr capsule Take 9 mg by mouth daily.   buPROPion (WELLBUTRIN SR) 150 MG 12 hr tablet Take 150 mg by mouth 2 (two) times daily.   busPIRone (BUSPAR) 5 MG tablet Take 5 mg by mouth 3 (three) times daily as needed (anxiety).   cloNIDine (CATAPRES) 0.1 MG tablet Take 0.1 mg by mouth 2 (two)  times daily.   diclofenac (VOLTAREN) 75 MG EC tablet Take 75 mg by mouth 2 (two) times daily as needed.   diphenoxylate-atropine (LOMOTIL) 2.5-0.025 MG tablet Take 1 tablet by mouth 4 (four) times daily as needed for diarrhea or loose stools.   eszopiclone (LUNESTA) 2 MG TABS tablet Take 2 mg by mouth at bedtime as needed.   fluconazole (DIFLUCAN) 200 MG tablet Take 200 mg by mouth daily.   fluticasone (FLONASE) 50 MCG/ACT nasal spray Place 2 sprays into both nostrils daily as needed for allergies or rhinitis.   fulvestrant (FASLODEX) 250 MG/5ML injection Inject into the muscle once. One injection each buttock over 1-2 minutes. Warm prior to use.   HYDROcodone-acetaminophen (NORCO/VICODIN) 5-325 MG tablet Take 1-2 tablets by mouth every 6 (six) hours as needed for moderate pain.   levothyroxine (SYNTHROID, LEVOTHROID) 50 MCG tablet Take 50 mcg by mouth daily before breakfast.    Magnesium Oxide 500 MG CAPS Take by mouth.   metoprolol succinate (TOPROL-XL) 25 MG 24 hr tablet Take 25 mg by mouth daily.    OVER THE COUNTER MEDICATION Take 2 tablets by mouth daily. Bladder Control 360   polycarbophil (FIBERCON) 625 MG tablet Take 625 mg by mouth daily.     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 [employer]  Tobacco Use   Smoking status: Former    Packs/day: 0.50    Years: 20.00    Pack years: 10.00    Types: Cigarettes    Quit date: 03/15/1964    Years since quitting: 57.2   Smokeless tobacco: Never  Vaping Use   Vaping Use: Never used  Substance and Sexual Activity   Alcohol use: Yes    Comment: social   Drug use: No   Sexual activity: Not on file  Other Topics Concern   Not on file  Social History  Narrative   Not on file   Social Determinants of Health   Financial Resource Strain: Not on file  Food Insecurity: Not on file  Transportation Needs: Not on file  Physical Activity: Not on file  Stress: Not on file  Social Connections: Not on file     Family History: The patient's family history includes Breast cancer in her sister; Heart disease in her father and maternal grandfather; Stroke in her mother.  ROS:   Please see the history of present illness.    She is accompanied by her daughter.  She is somewhat depressed about the chronic diarrhea.  She is dressed up today looks very nice.  She has been out with her children taking care of business.  Tolerated the ambulation well.  All other systems reviewed and are negative.  EKGs/Labs/Other Studies Reviewed:    The following studies were reviewed today: 2 D Doppler ECHOCARDIOGRAM 10/26/2018: IMPRESSIONS     1. The left ventricle has low normal systolic function, with an ejection  fraction of 50-55%. The cavity size was normal. There is moderate  asymmetric left ventricular hypertrophy. Left ventricular diastolic  Doppler parameters are consistent with  pseudonormalization. Elevated left ventricular end-diastolic pressure.   2. The right ventricle has normal systolic function. The cavity was  normal. There is no increase in right ventricular wall thickness.   3. Mild thickening of the mitral valve leaflet. Mild calcification of the  mitral valve leaflet. There is moderate mitral annular calcification  present.   4. The aorta is normal unless otherwise noted.   5. - TAVR: Post TAVR with 26 mm Sapien 3 no PVL and similar gradients to  June 2019 mean 8 to 9 mmHg peak 14 to 18 mmHg.   EKG:  EKG sinus rhythm, first-degree AV block, incomplete right bundle branch block on EKG performed October 21, 2020.  Recent Labs: 10/21/2020: Magnesium 1.7 03/11/2021: ALT 18; BUN 18; Creatinine 1.1; Hemoglobin 11.6; Platelets 214; Potassium  3.9; Sodium 137  Recent Lipid Panel No results found for: CHOL, TRIG, HDL, CHOLHDL, VLDL, LDLCALC, LDLDIRECT  Physical Exam:    VS:  BP 122/60    Pulse 85    Ht 4\' 11"  (1.499 m)    Wt 152 lb 6.4 oz (69.1 kg)    SpO2 97%    BMI 30.78 kg/m     Wt Readings from Last 3 Encounters:  05/13/21 152 lb 6.4 oz (69.1 kg)  04/13/21 151 lb (68.5 kg)  03/12/21 156 lb 8 oz (71 kg)     GEN: Elderly and frail.  Ambulates with a rolling walker.. No acute distress HEENT: Normal NECK: No JVD. LYMPHATICS: No lymphadenopathy CARDIAC: 2/6 right upper sternal systolic without diastolic murmur. RRR no gallop, or edema. VASCULAR:  Normal Pulses. No bruits. RESPIRATORY:  Clear to auscultation without rales, wheezing or rhonchi  ABDOMEN: Soft, non-tender, non-distended, No pulsatile mass, MUSCULOSKELETAL: No deformity  SKIN: Warm and dry NEUROLOGIC:  Alert and oriented x 3 PSYCHIATRIC:  Normal affect   ASSESSMENT:    1. S/P TAVR (transcatheter aortic valve replacement)   2. Coronary artery disease involving native coronary artery of native heart without angina pectoris   3. Secondary hypertension   4. Chronic diastolic CHF (congestive heart failure) (HCC)   5. Stage 3 chronic kidney disease, unspecified whether stage 3a or 3b CKD (HCC)    PLAN:    In order of problems listed above:  No follow-up echo.  Patient is doing well.  Auscultation is not alarming.  Plan clinical follow-up in 1 year. She denies angina pectoris.  Continue low-dose atorvastatin.  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  Most recent LDL was 194 in January 2023.  She is not willing to add more medication or intensify current therapy. Blood pressure is adequate for 88 year old.  I believe we can discontinue amlodipine.  Blood pressure is above 998 mmHg systolic or unacceptable. No clinical signs of volume overload. Most recent creatinine was 1. 01 and therefore there is no obvious CKD.  He  prefers to continue having yearly follow-up.  Medication Adjustments/Labs and Tests Ordered: Current medicines are reviewed at length with the patient today.  Concerns regarding medicines are outlined above.  No orders of the defined types were placed in this encounter.  No orders of the defined types were placed in this encounter.   There are no Patient Instructions on file for this visit.   Signed, [name], MD  05/13/2021 1:38 PM    [place]

## 2021-05-13 ENCOUNTER — Encounter: Payer: Self-pay | Admitting: Interventional Cardiology

## 2021-05-13 ENCOUNTER — Other Ambulatory Visit: Payer: Self-pay

## 2021-05-13 ENCOUNTER — Ambulatory Visit (INDEPENDENT_AMBULATORY_CARE_PROVIDER_SITE_OTHER): Payer: Medicare Other | Admitting: Interventional Cardiology

## 2021-05-13 VITALS — BP 122/60 | HR 85 | Ht 59.0 in | Wt 152.4 lb

## 2021-05-13 DIAGNOSIS — I159 Secondary hypertension, unspecified: Secondary | ICD-10-CM | POA: Diagnosis not present

## 2021-05-13 DIAGNOSIS — I5032 Chronic diastolic (congestive) heart failure: Secondary | ICD-10-CM

## 2021-05-13 DIAGNOSIS — N183 Chronic kidney disease, stage 3 unspecified: Secondary | ICD-10-CM

## 2021-05-13 DIAGNOSIS — Z952 Presence of prosthetic heart valve: Secondary | ICD-10-CM | POA: Diagnosis not present

## 2021-05-13 DIAGNOSIS — I2510 Atherosclerotic heart disease of native coronary artery without angina pectoris: Secondary | ICD-10-CM | POA: Diagnosis not present

## 2021-05-13 NOTE — Patient Instructions (Signed)
Medication Instructions:  ?1) DISCONTINUE Amlodipine ? ?*If you need a refill on your cardiac medications before your next appointment, please call your pharmacy* ? ? ?Lab Work: ?None ?If you have labs (blood work) drawn today and your tests are completely normal, you will receive your results only by: ?MyChart Message (if you have MyChart) OR ?A paper copy in the mail ?If you have any lab test that is abnormal or we need to change your treatment, we will call you to review the results. ? ? ?Testing/Procedures: ?None ? ? ?Follow-Up: ?At [facility], you and your health needs are our priority.  As part of our continuing mission to provide you with exceptional heart care, we have created designated Provider Care Teams.  These Care Teams include your primary Cardiologist (physician) and Advanced Practice Providers (APPs -  Physician Assistants and Nurse Practitioners) who all work together to provide you with the care you need, when you need it. ? ?We recommend signing up for the patient portal called "MyChart".  Sign up information is provided on this After Visit Summary.  MyChart is used to connect with patients for Virtual Visits (Telemedicine).  Patients are able to view lab/test results, encounter notes, upcoming appointments, etc.  Non-urgent messages can be sent to your provider as well.   ?To learn more about what you can do with MyChart, go to [email or web address].   ? ?Your next appointment:   ?1 year(s) ? ?The format for your next appointment:   ?In Person ? ?Provider:   ?[name], MD  ? ? ?Other Instructions ?  ?

## 2021-05-29 ENCOUNTER — Telehealth: Payer: Self-pay

## 2021-05-29 NOTE — Telephone Encounter (Addendum)
I LVM on Cindy's identified answering machine, notifying her of Dr Remi Deter response below.  ?----- Message from Derwood Kaplan, MD sent at 05/29/2021  2:03 PM EDT ----- ?Regarding: RE: Question about medications & PCP ?I understand but I really can't do that.  She is on many meds and I am not treating all of her other medical problems.  She really needs a PCP, sorry ?----- Message ----- ?From: Dairl Ponder, RN ?Sent: 05/27/2021   4:04 PM EDT ?To: Derwood Kaplan, MD ?Subject: Question about medications & PCP              ? ?Pt's daughter, Jenny Reichmann, called to notify us that Dr Laqueta Due has dismissed her mother from her practice. (Dr Laqueta Due is cutting back on pt's/hours per Jenny Reichmann) Jenny Reichmann wants to know, "since Dr Hinton Rao has known mom for a long time, would she be willing to write her routine meds? She does have a cardiologist that could fill her heart meds".   ? ? ?

## 2021-06-22 ENCOUNTER — Encounter (INDEPENDENT_AMBULATORY_CARE_PROVIDER_SITE_OTHER): Payer: Medicare Other | Admitting: Ophthalmology

## 2021-08-03 ENCOUNTER — Telehealth: Payer: Self-pay

## 2021-08-03 NOTE — Telephone Encounter (Signed)
Dr Hinton Rao states she hasn't received any type of survey/form to be completed. I have attempted to return call to pt's son, Harrington Challenger to notify him of that, but non answer.

## 2021-08-03 NOTE — Telephone Encounter (Signed)
Spoke with Harrington Challenger, he is calling the [employer] company again to ask them to resend this paperwork.

## 2021-08-06 ENCOUNTER — Encounter: Payer: Self-pay | Admitting: Oncology

## 2021-08-31 ENCOUNTER — Telehealth: Payer: Self-pay | Admitting: Oncology

## 2021-08-31 NOTE — Telephone Encounter (Signed)
Contacted pt but not able to speak with her.. Someone picked up the phone but did not speak.  appts? Received: 2 days ago Derwood Kaplan, MD  Neva Seat; [place], [place]; Belva Chimes, LPN; Dairl Ponder, RN; Georgann Housekeeper, Thalia Bloodgood, PA-C; 1 other Last visit with me in Dec and I rec f/u in 8 weeks with labs.  Supposed to be on monthly injections and none since Jan.  Daughter called in March to tell us that Dr. Laqueta Due has dismissed her from her practice but I said I could not fill her many medications, she needs PCP. I don't know if that is why she is not coming, or too ill, or just forgot?  Son called last month about insurance form.

## 2021-08-31 NOTE — Telephone Encounter (Signed)
Follow-up appt has been scheduled; pt aware of date and time.

## 2021-09-01 ENCOUNTER — Other Ambulatory Visit: Payer: Self-pay | Admitting: Pharmacist

## 2021-09-06 NOTE — Progress Notes (Signed)
[facility]  [address] [place],  [place]  [address] [phone]  Clinic Day:  09/07/21  Referring physician: Ernestene Kiel, MD   CHIEF COMPLAINT:  CC: History of multiple breast cancers  Current Treatment:  Palliative care   HISTORY OF PRESENT ILLNESS:  [name] is a 88 y.o. female with a history of multiple breast cancers.  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  Pathology revealed a 0.7 cm lesion which was estrogen and progesterone receptor positive.  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  Estrogen and progesterone receptors were positive.  She received adjuvant radiation.  She was placed on tamoxifen, but stopped it on her own within a few months.  In December 2014, she was found to have a new lesion of the left breast found on routine mammogram and associated with retraction of the left nipple.  Initially, it measured 1.9 cm with an enlarged left axillary node, but slowly enlarged over the next few months, up to 3 cm.  Biopsy confirmed invasive mammary carcinoma with lobular features.  Estrogen and progesterone receptors were positive with her 2 neu negative.  Ki 67 was 34%.  CT scans were negative for metastatic disease.  She has many comorbidities including significant cardiac disease, so declined surgery.  She was placed on hormonal therapy with letrozole 2.5 mg daily in 2015.  She had a good response to this, with control of her disease.  Bilateral mammogram and left breast ultrasound in February of 2017 showed a persistent node, which was down to 5-6 mm and the subareolar left breast calcifications were stable, with no evidence of definite mass.  She also had a bone density at that time, which continues to show osteopenia.  She stopped the letrozole in March of 2017,  because of hot flashes, with improvement of this.  However, she had progressive disease while off  treatment with a recurrent central left breast mass.  She was then placed on fulvestrant injections in May of 2017, and has tolerated these quite well.  She has had a slow decrease in her mass since that time. We referred her back to her cardiologist, Dr. Daneen Schick.  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  Bone density scan from June 2021 revealed osteoporosis with a T-score of -3.2 of the left forearm, previously -1.8.  The right femur measures -2.2, previously -1.8, and is considered osteopenic.       INTERVAL HISTORY:  [name] is here to discuss her fulvestrant injections. She has missed several in the last 6 months. She continues to use her walker to ambulate, and has had falls. She continues to reside at [facility]. She still has problems with diarrhea.  Hemoglobin is worse at 9.4, and white count and platelets are elevated. Chemistries are unremarkable except for a BUN of 18, and a creatinine of 1.1..  Her  appetite is poor, and lost 6 pounds since her last visit.  She denies fever or other signs of infection.  She denies nausea, vomiting, bowel issues, or abdominal pain.  She denies sore throat, cough, dyspnea, or chest pain. At this time she wishes to stop her fulvestrant but wanted to speak with me in person.  Her quality of life is  declining and the breast cancer is not a threat to her life.  I told her I understand and will be available to help her in any way I can.  We will not schedule further injections or follow up.  REVIEW OF SYSTEMS:  Review of Systems  Constitutional:  Negative for appetite change, chills, fatigue, fever and unexpected weight change.  Eyes:  Eye problems: vision problems.  Respiratory: Negative.  Negative for chest tightness, cough, hemoptysis, shortness of breath and wheezing.   Cardiovascular: Negative.   Negative for chest pain, leg swelling and palpitations.  Gastrointestinal:  Negative for abdominal distention, abdominal pain, blood in stool, constipation, diarrhea, nausea and vomiting.  Endocrine: Negative.   Genitourinary: Negative.  Negative for difficulty urinating, dysuria, frequency and hematuria.   Musculoskeletal:  Positive for gait problem (uses a walker). Negative for arthralgias, back pain, flank pain and myalgias.  Skin: Negative.   Neurological:  Positive for gait problem (uses a walker). Negative for dizziness, extremity weakness, headaches, light-headedness, numbness, seizures and speech difficulty.  Hematological: Negative.   Psychiatric/Behavioral: Negative.  Negative for depression and sleep disturbance. The patient is not nervous/anxious.      VITALS:  Blood pressure (!) 168/70, pulse 69, resp. rate 18, height '4\' 11"'$  (1.499 m), weight 146 lb 1.6 oz (66.3 kg), SpO2 95 %.  Wt Readings from Last 3 Encounters:  09/07/21 146 lb 1.6 oz (66.3 kg)  05/13/21 152 lb 6.4 oz (69.1 kg)  04/13/21 151 lb (68.5 kg)    Body mass index is 29.51 kg/m.  Performance status (ECOG): 2  PHYSICAL EXAM:  Physical Exam Constitutional:      General: She is not in acute distress.    Appearance: Normal appearance. She is normal weight.  HENT:     Head: Normocephalic and atraumatic.  Eyes:     General: No scleral icterus.    Extraocular Movements: Extraocular movements intact.     Conjunctiva/sclera: Conjunctivae normal.     Pupils: Pupils are equal, round, and reactive to light.  Cardiovascular:     Rate and Rhythm: Regular rhythm. Bradycardia present.     Pulses: Normal pulses.     Heart sounds: Murmur heard.     Systolic murmur is present with a grade of 2/6.     No friction rub. No gallop.  Pulmonary:     Effort: Pulmonary effort is normal. No respiratory distress.     Breath sounds: Normal breath sounds.  Chest:     Comments: Central firmness of the left breast. Right breast  is without masses. Abdominal:     General: Bowel sounds are normal. There is no distension.     Palpations: Abdomen is soft. There is no hepatomegaly, splenomegaly or mass.     Tenderness: There is no abdominal tenderness.  Musculoskeletal:        General: Normal range of motion.     Cervical back: Normal range of motion and neck supple.     Right lower leg: Edema (mild) present.     Left lower leg: Edema (mild) present.  Lymphadenopathy:     Cervical: No cervical adenopathy.  Skin:    General: Skin is warm and dry.  Neurological:     General: No focal deficit present.     Mental Status: She is alert and oriented to person, place, and time. Mental status is at baseline.  Psychiatric:        Mood and Affect: Mood normal.        Behavior:  Behavior normal.        Thought Content: Thought content normal.        Judgment: Judgment normal.     LABS:      Latest Ref Rng & Units 09/07/2021   12:00 AM 03/11/2021   12:00 AM 01/14/2021   12:00 AM  CBC  WBC  11.5     5.7     6.0   Hemoglobin 12.0 - 16.0 9.4     11.6     11.9   Hematocrit 36 - 46 29     35     36   Platelets 150 - 400 K/uL 422     214     218      This result is from an external source.      Latest Ref Rng & Units 09/07/2021   12:00 AM 03/11/2021   12:00 AM 01/14/2021   12:00 AM  CMP  BUN 4 - '21 18     18     12   '$ Creatinine 0.5 - 1.1 1.1     1.1     1.0   Sodium 137 - 147 136     137     139   Potassium 3.5 - 5.1 mEq/L 4.5     3.9     4.0   Chloride 99 - 108 103     106     107   CO2 13 - '22 25     25     25   '$ Calcium 8.7 - 10.7 9.0     8.8     9.1   Alkaline Phos 25 - 125 52     47     45   AST 13 - 35 '25     24     25   '$ ALT 7 - 35 U/L '16     18     13      '$ This result is from an external source.    STUDIES:  No results found.   Allergies:  Allergies  Allergen Reactions   Latex Hives and Swelling    Other reaction(s): Unknown   Lisinopril Cough    Other reaction(s): Unknown   Povidone Iodine  Other (See Comments)    Blisters  Other reaction(s): Unknown    Current Medications: Current Outpatient Medications  Medication Sig Dispense Refill   acetaminophen-codeine (TYLENOL #3) 300-30 MG tablet Take 1 tablet by mouth 2 (two) times daily as needed.     amLODipine (NORVASC) 2.5 MG tablet Take 2.5 mg by mouth 2 (two) times daily.     Ascorbic Acid (VITAMIN C) 1000 MG tablet Take 1,000 mg by mouth daily.     atorvastatin (LIPITOR) 10 MG tablet Take 10 mg by mouth daily.     Azelastine-Fluticasone 137-50 MCG/ACT SUSP Place 1 spray into both nostrils 2 (two) times daily as needed (congestion). (Patient not taking: Reported on 05/13/2021)     budesonide (ENTOCORT EC) 3 MG 24 hr capsule Take 9 mg by mouth daily.     buPROPion (WELLBUTRIN XL) 300 MG 24 hr tablet Take 300 mg by mouth every morning.     busPIRone (BUSPAR) 5 MG tablet Take 5 mg by mouth 3 (three) times daily as needed (anxiety).     Carboxymethylcellulose Sodium (THERATEARS) 0.25 % SOLN TheraTears (Patient not taking: Reported on 05/13/2021)     cholecalciferol (VITAMIN D3) 25 MCG (1000 UT) tablet Take 1,000 Units by mouth  daily. (Patient not taking: Reported on 05/13/2021)     cloNIDine (CATAPRES) 0.1 MG tablet Take 0.1 mg by mouth 2 (two) times daily.     diclofenac (VOLTAREN) 75 MG EC tablet Take 75 mg by mouth 2 (two) times daily as needed.     diphenoxylate-atropine (LOMOTIL) 2.5-0.025 MG tablet Take 1 tablet by mouth 4 (four) times daily as needed for diarrhea or loose stools. 30 tablet 0   eszopiclone (LUNESTA) 2 MG TABS tablet Take 2 mg by mouth at bedtime as needed.     fluconazole (DIFLUCAN) 200 MG tablet Take 200 mg by mouth daily.     fluticasone (FLONASE) 50 MCG/ACT nasal spray Place 2 sprays into both nostrils daily as needed for allergies or rhinitis.     fulvestrant (FASLODEX) 250 MG/5ML injection Inject into the muscle once. One injection each buttock over 1-2 minutes. Warm prior to use.     HYDROcodone-acetaminophen  (NORCO/VICODIN) 5-325 MG tablet Take 1-2 tablets by mouth every 6 (six) hours as needed for moderate pain.     Lactobacillus (FLORAJEN WOMEN PO) Florajen Women (Patient not taking: Reported on 05/13/2021)     levothyroxine (SYNTHROID, LEVOTHROID) 50 MCG tablet Take 50 mcg by mouth daily before breakfast.      Magnesium Oxide 500 MG CAPS Take by mouth.     metoprolol succinate (TOPROL-XL) 25 MG 24 hr tablet Take 25 mg by mouth daily.      Misc Natural Products (NEURIVA PO) Take 1 capsule by mouth daily. Patient has not been taking this medication lately (Patient not taking: Reported on 05/13/2021)     nystatin (MYCOSTATIN) 100000 UNIT/ML suspension Take 5 mLs (500,000 Units total) by mouth 4 (four) times daily. (Patient not taking: Reported on 05/13/2021) 60 mL 0   OVER THE COUNTER MEDICATION Take 2 tablets by mouth daily. Bladder Control 360     polycarbophil (FIBERCON) 625 MG tablet Take 625 mg by mouth daily.     Probiotic Product (ALIGN PO) Take 1 capsule by mouth daily. (Patient not taking: Reported on 05/13/2021)     RESTORA RX 60-1.25 MG CAPS Take 1 capsule by mouth daily. (Patient not taking: Reported on 05/13/2021)     No current facility-administered medications for this visit.     ASSESSMENT & PLAN:   Assessment:   1. Stage 0 breast cancer April 2009 of the left breast treated with lumpectomy.  2. Stage I invasive ductal carcinoma of the right breast in 2010 treated with surgery and adjuvant radiation.  3. Stage IIB left breast cancer diagnosed in December of 2014 with a 1.9 cm lesion and a 3 cm left axillary node for a T2 N1 M0.  Once again this was estrogen and progesterone receptor positive but she had not taken hormonal therapy in the past.  She declined surgery because of her comorbidities and so was placed on hormonal therapy with letrozole in 2015 until March of 2017. At that time she stopped it because of severe hot flashes and was changed to fulvestrant injections monthly as of May of  2017. She has done very well with that.  Her left breast mass had started to grow back in 2017, but is back under control, and I just palpate a central firmness of the left breast. However, she has missed some doses this year and does not wish to continue the injections.  4. Nodular mass effect in the epigastrium which is a small ventral hernia containing fat.  She underwent hernia repair in December  2020, but I can still feel a small nodule in the epigastrium on palpation.  5. Osteoporosis.  Bone density scan from June 2021 revealed severe worsening and she is now considered osteoporotic with a T-score of -3.2 of the left forearm.  The femur has also shown worsening but is still considered within the osteopenia range.     6. Status post TAVR 2019, and doing extremely well.   7. Anemia, worse.    Plan: We will stop the fulvestrant injections as her overall health and performance status is declining and she does not wish to continue. She wanted to come and discuss this in person and I let her know I understand.  She does now wish further follow up but knows to call if she needs to be seen.   I provided 20 minutes of face-to-face time during this this encounter and > 50% was spent counseling as documented under my assessment and plan.    Derwood Kaplan, MD [facility] [address] [place] [address] Dept: [phone] Dept Fax: [phone]

## 2021-09-07 ENCOUNTER — Other Ambulatory Visit: Payer: Self-pay | Admitting: Hematology and Oncology

## 2021-09-07 ENCOUNTER — Inpatient Hospital Stay: Payer: Medicare Other | Attending: Oncology

## 2021-09-07 ENCOUNTER — Inpatient Hospital Stay (INDEPENDENT_AMBULATORY_CARE_PROVIDER_SITE_OTHER): Payer: Medicare Other | Admitting: Oncology

## 2021-09-07 ENCOUNTER — Encounter: Payer: Self-pay | Admitting: Oncology

## 2021-09-07 ENCOUNTER — Ambulatory Visit: Payer: Medicare Other

## 2021-09-07 VITALS — BP 168/70 | HR 69 | Resp 18 | Ht 59.0 in | Wt 146.1 lb

## 2021-09-07 DIAGNOSIS — C50112 Malignant neoplasm of central portion of left female breast: Secondary | ICD-10-CM

## 2021-09-07 DIAGNOSIS — C50411 Malignant neoplasm of upper-outer quadrant of right female breast: Secondary | ICD-10-CM | POA: Diagnosis not present

## 2021-09-07 DIAGNOSIS — Z170 Estrogen receptor positive status [ER+]: Secondary | ICD-10-CM | POA: Diagnosis not present

## 2021-09-07 LAB — CBC AND DIFFERENTIAL
HCT: 29 — AB (ref 36–46)
Hemoglobin: 9.4 — AB (ref 12.0–16.0)
Neutrophils Absolute: 8.86
Platelets: 422 10*3/uL — AB (ref 150–400)
WBC: 11.5

## 2021-09-07 LAB — HEPATIC FUNCTION PANEL
ALT: 16 U/L (ref 7–35)
AST: 25 (ref 13–35)
Alkaline Phosphatase: 52 (ref 25–125)
Bilirubin, Total: 0.6

## 2021-09-07 LAB — BASIC METABOLIC PANEL
BUN: 18 (ref 4–21)
CO2: 25 — AB (ref 13–22)
Chloride: 103 (ref 99–108)
Creatinine: 1.1 (ref 0.5–1.1)
Glucose: 95
Potassium: 4.5 mEq/L (ref 3.5–5.1)
Sodium: 136 — AB (ref 137–147)

## 2021-09-07 LAB — CBC: RBC: 3.4 — AB (ref 3.87–5.11)

## 2021-09-07 LAB — COMPREHENSIVE METABOLIC PANEL
Albumin: 3.6 (ref 3.5–5.0)
Calcium: 9 (ref 8.7–10.7)

## 2021-09-17 ENCOUNTER — Other Ambulatory Visit: Payer: Self-pay | Admitting: Oncology

## 2022-02-12 ENCOUNTER — Ambulatory Visit: Payer: Medicare Other

## 2022-02-19 ENCOUNTER — Other Ambulatory Visit: Payer: Self-pay

## 2022-02-19 MED ORDER — LEVOFLOXACIN 500 MG PO TABS: 500.0000 mg | ORAL_TABLET | Freq: Every day | ORAL | 0 refills | Status: DC

## 2022-02-26 ENCOUNTER — Ambulatory Visit: Payer: Medicare Other | Admitting: Internal Medicine

## 2022-02-26 ENCOUNTER — Encounter: Payer: Self-pay | Admitting: Internal Medicine

## 2022-02-26 VITALS — BP 120/60 | HR 99 | Temp 97.5°F | Resp 16 | Ht 59.0 in | Wt 148.0 lb

## 2022-02-26 DIAGNOSIS — R051 Acute cough: Secondary | ICD-10-CM | POA: Diagnosis not present

## 2022-02-26 MED ORDER — DOXYCYCLINE MONOHYDRATE 100 MG PO CAPS: 100.0000 mg | ORAL_CAPSULE | Freq: Two times a day (BID) | ORAL | 0 refills | Status: DC

## 2022-02-26 MED ORDER — LEVOFLOXACIN 750 MG PO TABS: 750.0000 mg | ORAL_TABLET | ORAL | 0 refills | Status: DC

## 2022-02-26 MED ORDER — FLUTICASONE PROPIONATE 50 MCG/ACT NA SUSP: 1.0000 | Freq: Two times a day (BID) | NASAL | 0 refills | Status: DC

## 2022-02-26 NOTE — Addendum Note (Signed)
Addended by: Townsend Roger on: 02/26/2022 02:06 PM   Modules accepted: Orders

## 2022-02-26 NOTE — Assessment & Plan Note (Addendum)
On exam she has rales but she is sat'ing well.  I am going to continue her on plain mucinex but she is having some upper secretions/post nasal drip.  I want her to start zyrtec and flonase.  We will start her on levaquin '750mg'$  every other day adjusted for her kidney function.  We will also obtain a chest xray.

## 2022-02-26 NOTE — Progress Notes (Addendum)
Office Visit  Subjective   Patient ID: [name]   DOB: [birth year]   Age: 88 y.o.   MRN: [id number]   Chief Complaint Chief Complaint  Patient presents with   Follow-up    SOB after covid. Covid positive 3 weeks ago     History of Present Illness The patient is a 88 yo female who returns today for discussion of SOB which has continued to go on since she was diagnosed with COVID on 02/08/2022.  She currently lives in the independent living apartments at [place] where she was seen by my NP.  She was having weakness with coughing, myalgias with sinus congestion.  She was seen by my NP and the patient was positive on a rapid COVID antigen testing.  The patient was not placed on any antiviral but was giving supportive care.  She got worse with confusion but it took two weeks for this to resolve.  Today, she comes in today clinic due to [place] when she tries to transfer herself and she states she does have a cough productive of tan sputum as well as some overall weakness.  There is no fevers, chills, sinus congestion, wheezing, nausea, vomiting, diarrhea.  She is eating well.  She is using intermittent mucinex.       Past Medical History Past Medical History:  Diagnosis Date   Aortic stenosis, severe    a. 07/2017: s/p TAVR w/ an Edwards Sapien 3 THV (size 26 mm, model # [id number], serial # [id number])   CAD (coronary artery disease)    a. 2010: s/p stent to RCA   Chronic kidney disease, stage III (moderate) (HCC)    Chronic sinusitis    Depressive disorder    History of breast cancer    a. s/p lumpectomies and XRT   Hyperlipemia    Hypertension    Hypothyroid    Insomnia    Memory loss    Osteoarthritis    Reflux esophagitis      Allergies Allergies  Allergen Reactions   Latex Hives and Swelling    Other reaction(s): Unknown   Lisinopril Cough    Other reaction(s): Unknown   Povidone Iodine Other (See Comments)    Blisters  Other reaction(s): Unknown      Review of Systems Review of Systems  Constitutional:  Negative for chills and fever.  Respiratory:  Positive for cough and sputum production. Negative for wheezing.        No SOB at rest  Cardiovascular:  Positive for leg swelling. Negative for chest pain and palpitations.  Gastrointestinal:  Negative for constipation, diarrhea, nausea and vomiting.  Musculoskeletal:  Negative for myalgias.  Neurological:  Positive for weakness. Negative for dizziness and headaches.       Objective:    Vitals BP 120/60   Pulse 99   Temp (!) 97.5 F (36.4 C)   Resp 16   Ht '4\' 11"'$  (1.499 m)   Wt 148 lb (67.1 kg)   SpO2 95%   BMI 29.89 kg/m    Physical Examination Physical Exam Constitutional:      Appearance: Normal appearance. She is not ill-appearing.  HENT:     Right Ear: Tympanic membrane, ear canal and external ear normal.     Left Ear: Tympanic membrane, ear canal and external ear normal.     Nose: Congestion and rhinorrhea present.     Mouth/Throat:     Mouth: Mucous membranes are moist.     Pharynx: No oropharyngeal  exudate or posterior oropharyngeal erythema.  Cardiovascular:     Rate and Rhythm: Normal rate and regular rhythm.     Pulses: Normal pulses.     Heart sounds: No murmur heard.    No friction rub. No gallop.  Pulmonary:     Effort: Pulmonary effort is normal.     Breath sounds: Rales present. No wheezing or rhonchi.     Comments: She has rales on her left side, no accessory muscle movement and good air movement otherwise Abdominal:     General: Abdomen is flat. Bowel sounds are normal. There is no distension.     Palpations: Abdomen is soft.     Tenderness: There is no abdominal tenderness.  Musculoskeletal:     Right lower leg: No edema.     Left lower leg: No edema.  Skin:    General: Skin is warm and dry.     Findings: No rash.  Neurological:     Mental Status: She is alert.        Assessment & Plan:   Acute cough On exam she has rales but she  is sat'ing well.  I am going to continue her on plain mucinex but she is having some upper secretions/post nasal drip.  I want her to start zyrtec and flonase.  We will start her on levaquin '750mg'$  every other day adjusted for her kidney function.  We will also obtain a chest xray.  Addendum:  I did get called by my NP who told me she just finished a course of levaquin yesterday.  The family did not tell me this.  I have her CXR back now from [facility] and this shows coarse right greater than left mid and lower lung heteogenous opacities.  I think this is probably COVID-19 pneumonia.  I called her son and told him that they did not tell me that she just completed levaquin and I told them to not fill the levaquin as it could increase her risk of C. Diff infection.  Since she is having some tan colored sputum, we will try her on doxycline at this time.  She may have COVID-19 pnuemonia and it could take weeks for her SOB to clear.  If it does not, she will need a high resolution CT of the chest in the future to assess for pulmonary fibrosis.    No follow-ups on file.   Townsend Roger, MD

## 2022-03-02 DIAGNOSIS — I4891 Unspecified atrial fibrillation: Secondary | ICD-10-CM | POA: Diagnosis not present

## 2022-03-02 DIAGNOSIS — Z952 Presence of prosthetic heart valve: Secondary | ICD-10-CM | POA: Diagnosis not present

## 2022-03-02 DIAGNOSIS — I2510 Atherosclerotic heart disease of native coronary artery without angina pectoris: Secondary | ICD-10-CM | POA: Diagnosis not present

## 2022-03-02 DIAGNOSIS — I361 Nonrheumatic tricuspid (valve) insufficiency: Secondary | ICD-10-CM | POA: Diagnosis not present

## 2022-03-02 DIAGNOSIS — I5030 Unspecified diastolic (congestive) heart failure: Secondary | ICD-10-CM | POA: Diagnosis not present

## 2022-03-03 DIAGNOSIS — Z952 Presence of prosthetic heart valve: Secondary | ICD-10-CM | POA: Diagnosis not present

## 2022-03-03 DIAGNOSIS — I2510 Atherosclerotic heart disease of native coronary artery without angina pectoris: Secondary | ICD-10-CM | POA: Diagnosis not present

## 2022-03-03 DIAGNOSIS — I5030 Unspecified diastolic (congestive) heart failure: Secondary | ICD-10-CM | POA: Diagnosis not present

## 2022-03-03 DIAGNOSIS — I4891 Unspecified atrial fibrillation: Secondary | ICD-10-CM | POA: Diagnosis not present

## 2022-03-09 ENCOUNTER — Other Ambulatory Visit: Payer: Self-pay

## 2022-03-09 MED ORDER — BUSPIRONE HCL 5 MG PO TABS: 5.0000 mg | ORAL_TABLET | Freq: Three times a day (TID) | ORAL | 0 refills | Status: DC | PRN

## 2022-03-09 MED ORDER — DICLOFENAC SODIUM 25 MG PO TBEC: 25.0000 mg | DELAYED_RELEASE_TABLET | Freq: Two times a day (BID) | ORAL | 0 refills | Status: DC | PRN

## 2022-03-19 ENCOUNTER — Ambulatory Visit: Payer: Medicare Other | Admitting: Internal Medicine

## 2022-03-19 ENCOUNTER — Encounter: Payer: Self-pay | Admitting: Internal Medicine

## 2022-03-19 VITALS — BP 124/80 | HR 86 | Temp 97.7°F | Resp 18 | Ht 59.0 in | Wt 148.0 lb

## 2022-03-19 DIAGNOSIS — I5032 Chronic diastolic (congestive) heart failure: Secondary | ICD-10-CM

## 2022-03-19 DIAGNOSIS — K921 Melena: Secondary | ICD-10-CM

## 2022-03-19 DIAGNOSIS — I4891 Unspecified atrial fibrillation: Secondary | ICD-10-CM

## 2022-03-19 HISTORY — DX: Melena: K92.1

## 2022-03-19 HISTORY — DX: Unspecified atrial fibrillation: I48.91

## 2022-03-19 NOTE — Assessment & Plan Note (Signed)
She is in A. Fib with an irreg irreg rhythm on my exam however she is rate controlled.  I will have her followup with cardiology.  Cotninue on her beta blocker.

## 2022-03-19 NOTE — Assessment & Plan Note (Signed)
I gave her a FIT test to test for blood as she has iron deficiency anemia.  We will refer her to her GI doctor.

## 2022-03-19 NOTE — Progress Notes (Signed)
Office Visit  Subjective   Patient ID: [name]   DOB: February 12, 1926   Age: 89 y.o.   MRN: [id number]   Chief Complaint Chief Complaint  Patient presents with   Hospitalization [facility] follow up     History of Present Illness [name] returns today for a hospital followup where she was admitted to [facility] from 03/02/2022 until 03/03/2022.  She presented to the ER with increasing SOB which she had felt was residual from where was diagnosed with COVID-19 on 02/08/2022.  We had seen her in the office and started her on antibiotics but she did worsen.  In the ED, she was noted to have new onset A. Fib.  They obtained a CT scan of her chest that showed coronary calcifications and reflux of contrast into the IVC and hepatic veins which was compatible with right sided heart failure.  There were also bilateral pleural effusions.  She was admitted to the hospital and started on IV diuresis.  An ECHO was obtained that showed a normal LV function with an EF of 55-60% with possible diastolic dysfunction and mild TR.  Her CHADVASC score was a 4-5 and cardiology was consulted.  They recommended anticoagulation but she was noted to be anemic at 7.7 and the family wanted to talk to her cardiologist in [place] before starting blood thinners.  She was noted to have iron deficiency anemia and was started on oral iron.  There was no obvious bleeding.  They increased her toprolol XL '25mg'$  daily to BID dosing.  They also sent her home on low dose lasix '20mg'$  daily.  She was not hypoxic during her hospitalization and did not require oxygen.  She does states she has had 2 melena stools over the last week.       Past Medical History Past Medical History:  Diagnosis Date   Aortic stenosis, severe    a. 07/2017: s/p TAVR w/ an Edwards Sapien 3 THV (size 26 mm, model # [id number], serial # [id number])   CAD (coronary artery disease)    a. 2010: s/p stent to RCA   Chronic kidney disease, stage III  (moderate) (HCC)    Chronic sinusitis    Depressive disorder    History of breast cancer    a. s/p lumpectomies and XRT   Hyperlipemia    Hypertension    Hypothyroid    Insomnia    Memory loss    Osteoarthritis    Reflux esophagitis      Allergies Allergies  Allergen Reactions   Latex Hives and Swelling    Other reaction(s): Unknown   Lisinopril Cough    Other reaction(s): Unknown   Povidone Iodine Other (See Comments)    Blisters  Other reaction(s): Unknown     Review of Systems Review of Systems  Constitutional:  Negative for chills and fever.  HENT:  Positive for hearing loss.   Eyes:  Negative for blurred vision and double vision.  Respiratory:  Positive for shortness of breath. Negative for cough, hemoptysis and wheezing.   Cardiovascular:  Positive for leg swelling. Negative for chest pain and palpitations.  Gastrointestinal:  Positive for melena. Negative for abdominal pain, blood in stool, constipation, diarrhea, heartburn, nausea and vomiting.  Genitourinary:  Negative for hematuria.  Neurological:  Positive for weakness. Negative for headaches.       Objective:    Vitals BP 124/80 (BP Location: Left Arm, Patient Position: Sitting, Cuff Size: Normal)   Pulse  86   Temp 97.7 F (36.5 C)   Resp 18   Ht '4\' 11"'$  (1.499 m)   Wt 148 lb (67.1 kg)   SpO2 98%   BMI 29.89 kg/m    Physical Examination Physical Exam Constitutional:      Appearance: Normal appearance. She is not ill-appearing.  Cardiovascular:     Rate and Rhythm: Normal rate and regular rhythm.     Pulses: Normal pulses.     Heart sounds: No murmur heard.    No friction rub. No gallop.  Pulmonary:     Effort: Pulmonary effort is normal. No respiratory distress.     Breath sounds: No wheezing, rhonchi or rales.  Abdominal:     General: Abdomen is flat. Bowel sounds are normal. There is no distension.     Palpations: Abdomen is soft.     Tenderness: There is no abdominal tenderness.   Musculoskeletal:     Right lower leg: Edema present.     Left lower leg: Edema present.     Comments: She has 2+ edema in both feet from feet up to mid calf.  Neurological:     General: No focal deficit present.     Mental Status: She is alert and oriented to person, place, and time.        Assessment & Plan:   Chronic diastolic (congestive) heart failure ([place]) I had a discussion with her about diastolic dysfunction.  She still has some edema in her legs where I want her to increase her lasix from '20mg'$  BID to '40mg'$  BID for the next 3-4 days.  Her cardiologist in [place] retired this week.  I am going go refer her to [facility] cardiology.  She is not interested in anticoagulation.  A long discussion was had about pathophysiology of A. Fib and diastolic dysfunction.  She is to do daily weights and cut back on her salt.  Atrial fibrillation ([place]) She is in A. Fib with an irreg irreg rhythm on my exam however she is rate controlled.  I will have her followup with cardiology.  Cotninue on her beta blocker.  Melena I gave her a FIT test to test for blood as she has iron deficiency anemia.  We will refer her to her GI doctor.    Return in about 1 month (around 04/19/2022).   Townsend Roger, MD

## 2022-03-19 NOTE — Assessment & Plan Note (Signed)
I had a discussion with her about diastolic dysfunction.  She still has some edema in her legs where I want her to increase her lasix from '20mg'$  BID to '40mg'$  BID for the next 3-4 days.  Her cardiologist in [place] retired this week.  I am going go refer her to [facility] cardiology.  She is not interested in anticoagulation.  A long discussion was had about pathophysiology of A. Fib and diastolic dysfunction.  She is to do daily weights and cut back on her salt.

## 2022-03-25 ENCOUNTER — Other Ambulatory Visit: Payer: Self-pay

## 2022-03-25 MED ORDER — FUROSEMIDE 20 MG PO TABS: 20.0000 mg | ORAL_TABLET | Freq: Every day | ORAL | 1 refills | Status: DC

## 2022-04-01 DIAGNOSIS — F32A Depression, unspecified: Secondary | ICD-10-CM | POA: Insufficient documentation

## 2022-04-01 DIAGNOSIS — M1990 Unspecified osteoarthritis, unspecified site: Secondary | ICD-10-CM | POA: Insufficient documentation

## 2022-04-01 DIAGNOSIS — J329 Chronic sinusitis, unspecified: Secondary | ICD-10-CM | POA: Insufficient documentation

## 2022-04-08 ENCOUNTER — Other Ambulatory Visit: Payer: Self-pay

## 2022-04-08 DIAGNOSIS — R413 Other amnesia: Secondary | ICD-10-CM | POA: Insufficient documentation

## 2022-04-08 DIAGNOSIS — G4700 Insomnia, unspecified: Secondary | ICD-10-CM | POA: Insufficient documentation

## 2022-04-08 DIAGNOSIS — N183 Chronic kidney disease, stage 3 unspecified: Secondary | ICD-10-CM | POA: Insufficient documentation

## 2022-04-08 DIAGNOSIS — K2100 Gastro-esophageal reflux disease with esophagitis, without bleeding: Secondary | ICD-10-CM | POA: Insufficient documentation

## 2022-04-08 DIAGNOSIS — E039 Hypothyroidism, unspecified: Secondary | ICD-10-CM | POA: Insufficient documentation

## 2022-04-08 DIAGNOSIS — I350 Nonrheumatic aortic (valve) stenosis: Secondary | ICD-10-CM | POA: Insufficient documentation

## 2022-04-08 DIAGNOSIS — I2510 Atherosclerotic heart disease of native coronary artery without angina pectoris: Secondary | ICD-10-CM | POA: Insufficient documentation

## 2022-04-09 ENCOUNTER — Other Ambulatory Visit: Payer: Self-pay

## 2022-04-09 DIAGNOSIS — R635 Abnormal weight gain: Secondary | ICD-10-CM | POA: Diagnosis not present

## 2022-04-09 DIAGNOSIS — R600 Localized edema: Secondary | ICD-10-CM

## 2022-04-09 DIAGNOSIS — I5032 Chronic diastolic (congestive) heart failure: Secondary | ICD-10-CM | POA: Diagnosis not present

## 2022-04-09 DIAGNOSIS — R0602 Shortness of breath: Secondary | ICD-10-CM | POA: Diagnosis not present

## 2022-04-09 DIAGNOSIS — I4891 Unspecified atrial fibrillation: Secondary | ICD-10-CM | POA: Diagnosis not present

## 2022-04-09 DIAGNOSIS — R0609 Other forms of dyspnea: Secondary | ICD-10-CM

## 2022-04-09 MED ORDER — FUROSEMIDE 20 MG PO TABS: 20.0000 mg | ORAL_TABLET | Freq: Two times a day (BID) | ORAL | 12 refills | Status: DC

## 2022-04-09 MED ORDER — ALBUTEROL SULFATE HFA 108 (90 BASE) MCG/ACT IN AERS: 2.0000 | INHALATION_SPRAY | RESPIRATORY_TRACT | 12 refills | Status: DC | PRN

## 2022-04-12 NOTE — Progress Notes (Deleted)
Cardiology Office Note:    Date:  04/12/2022   ID:  [name], DOB 1925/07/20, MRN [id number]  PCP:  Seward Meth, NP  Cardiologist:  Shirlee More, MD   Referring MD: Seward Meth, NP  ASSESSMENT:    No diagnosis found. PLAN:    In order of problems listed above:  ***  Next appointment   Medication Adjustments/Labs and Tests Ordered: Current medicines are reviewed at length with the patient today.  Concerns regarding medicines are outlined above.  No orders of the defined types were placed in this encounter.  No orders of the defined types were placed in this encounter.    No chief complaint on file. ***  History of Present Illness:    [name] is a 89 y.o. female who is being seen today to establish cardiology care with a history of CAD s/p PCI to RCA in 2010, CKD III, HLD, HTN, hypothyroidism, breast cancer s/p lumpectomy and radiation, chronic diastolic CHF, and severe AS s/p TAVR (08/02/17).at the request of Seward Meth, NP.  She had an echocardiogram performed at [facility] 03/02/2022 there is no notation on the report that she had a TAVR but there is no finding of valve dysfunction other than trace aortic regurgitation her left ventricular ejection fraction was normal 55 to 60%. She was recently admitted to [facility] seen by my partner Dr. Agustin Cree with atrial fibrillation not anticoagulated hemoglobin of 8 03/03/2022 and heart failure.  Her admission hemoglobin was 7.7 and her admission diagnosis was heart failure Past Medical History:  Diagnosis Date   Abnormal gait 06/24/2015   Last Assessment & Plan:   Relevant Hx:  Course:  Daily Update:  Today's Plan:uses her cane for this and no recent falls     Electronically signed by: Mayer Camel, NP  07/23/15 1529   Acute cough 08/25/2011   Acute kidney injury superimposed on chronic kidney disease ([place]) 10/18/2020   Anemia due to stage 3 chronic  kidney disease ([place]) 06/24/2015   Last Assessment & Plan: Formatting of this note might be different from the original. Relevant Hx: Course: Daily Update: Today's Plan:update her CMP for her Electronically signed by: Mayer Camel, NP 07/23/15 1528 Formatting of this note might be different from the original. Last Assessment & Plan: Relevant Hx: Course: Daily Update: Today's Plan:update her CMP for her Electronically    Anxiety 06/24/2015   Last Assessment & Plan:   Relevant Hx:  Course:  Daily Update:  Today's Plan:this is stable for her at this time     Electronically signed by: Mayer Camel, NP  07/23/15 1529   Aortic stenosis, severe    a. 07/2017: s/p TAVR w/ an Edwards Sapien 3 THV (size 26 mm, model # [id number], serial # [id number])   Atherosclerosis of coronary artery 06/24/2015   Formatting of this note might be different from the original. Ostial RCA DES, 2010 Last Assessment & Plan: stable Last Assessment & Plan: Relevant Hx: Course: Daily Update: Today's Plan:she has felt she was stable from this and she has cardiologist she has followed with . Electronically signed by: Mayer Camel, NP 07/23/15 1523 Ostial RCA DES, 2010 Last Assessment & Plan: stable   Atrial fibrillation ([place]) 03/19/2022   Bilateral hearing loss 06/25/2015   Bilateral sensorineural hearing loss 12/26/2020   Breast cancer, right breast ([place]) 03/24/2008   CAD (coronary artery disease)    a. 2010: s/p stent to RCA   Chronic diastolic (congestive)  heart failure ([place]) 08/04/2017   Chronic kidney disease, stage III (moderate) (HCC)    Chronic recurrent major depressive disorder ([place]) 06/24/2015   Last Assessment & Plan:   Relevant Hx:  Course:  Daily Update:  Today's Plan:this is stable for her     Electronically signed by: Mayer Camel, NP  07/23/15 1530   Chronic sinusitis    Coronary artery disease involving native coronary artery of native heart with angina pectoris  ([place]) 06/24/2015   Formatting of this note might be different from the original. Overview: Ostial RCA DES, 2010 Last Assessment & Plan: stable Last Assessment & Plan: Formatting of this note might be different from the original. Relevant Hx: Course: Daily Update: Today's Plan:she has felt she was stable from this and she has cardiologist she has followed with . Electronically signed by: Mayer Camel, N   Coronary artery disease involving native coronary artery of native heart without angina pectoris    Ostial RCA DES, 2010   Decreased hearing of both ears 06/25/2015   Deficiency anemia 03/11/2021   Dehydration 10/19/2020   Depressive disorder    Dizziness 08/14/2018   [place] of macula of both eyes 09/12/2018   Ductal carcinoma in situ of breast 06/23/2007   Encounter for long-term current use of high risk medication 06/24/2015   Epiretinal membrane (ERM) of left eye 09/12/2018   Essential hypertension, benign 02/16/2013   Gastro-esophageal reflux disease with esophagitis 06/24/2015   Last Assessment & Plan:   Relevant Hx:  Course:  Daily Update:  Today's Plan:she is stable from this      Electronically signed by: Mayer Camel, NP  07/23/15 1526   Gastroesophageal reflux disease without esophagitis 12/27/2016   History of breast cancer    a. s/p lumpectomies and XRT   Hyperlipemia    Hyperlipidemia 02/16/2013   Hypertension    Hypothyroid    Hypothyroidism, acquired 06/24/2015   Last Assessment & Plan:   Relevant Hx:  Course:  Daily Update:  Today's Plan:update her TSH for her today     Electronically signed by: Mayer Camel, NP  07/23/15 1526   Incisional hernia, without obstruction or gangrene 02/24/2019   Insomnia    Intermediate stage nonexudative age-related macular degeneration of both eyes 01/19/2021   Irritation of oral cavity 11/23/2019   Malaise and fatigue 06/24/2015   Last Assessment & Plan:   Relevant Hx:  Course:  Daily Update:   Today's Plan:she is up and down with her energy and she feels that for the most part she has been stable     Electronically signed by: Mayer Camel, NP  07/23/15 1533   Malignant neoplasm of central portion of left female breast ([place]) 06/24/2015   Last Assessment & Plan:   Relevant Hx:  Course:  Daily Update:  Today's Plan:she is now receiving injections once a month for this and is hopeful will keep this at bay as this has been recurrent     Electronically signed by: Mayer Camel, NP  07/23/15 1530   Melena 03/19/2022   Memory change 07/23/2015   Last Assessment & Plan:   Relevant Hx:  Course:  Daily Update:  Today's Plan:she is going to take the aricept at supper     Electronically signed by: Mayer Camel, NP  07/23/15 1532   Memory loss    Multiple thyroid nodules 02/09/2012   Osteoarthritis    Osteoporosis 03/24/2020   Other allergic rhinitis 12/27/2016   Plantar  fat pad atrophy 01/19/2016   Posterior vitreous detachment of left eye 09/12/2018   Primary insomnia 06/24/2015   Last Assessment & Plan:   Relevant Hx:  Course:  Daily Update:  Today's Plan:she feels that this is stable for her at this time     Electronically signed by: Mayer Camel, NP  07/23/15 1531   Primary localized osteoarthrosis, ankle and foot 01/15/2016   Primary osteoarthritis involving multiple joints 06/24/2015   Last Assessment & Plan:   Relevant Hx:  Course:  Daily Update:  Today's Plan:she is stable from her joint though she has discomfort if she overexerts.     Electronically signed by: Mayer Camel, NP  07/23/15 1528   Reflux esophagitis    S/P TAVR (transcatheter aortic valve replacement) 08/02/2017   Severe aortic stenosis 02/16/2013   Physician Review  Conclusions: 1. Mild concentric left ventricular hypertrophy.  2. Left ventricular ejection fraction estimated by 2D at 60-65 percent.  3. There were no regional wall motion abnormalities.  4.  Mild mitral annular calcification.  5. Trace mitral valve regurgitation.  6. Trivial tricuspid regurgitation.  7. Moderate increased thickness and calcification of the trileaflet aortic val   Stage III chronic kidney disease ([place]) 06/24/2015   Last Assessment & Plan:   Relevant Hx:  Course:  Daily Update:  Today's Plan:update her CMP for her     Electronically signed by: Mayer Camel, NP  07/23/15 1528   Thyroid nodule 06/24/2015   Vitreous membranes and strands 01/19/2021   PPV on 02/11/2021 OS    Past Surgical History:  Procedure Laterality Date   ANGIOPLASTY     BIOPSY  10/21/2020   Procedure: BIOPSY;  Surgeon: Carol Ada, MD;  Location: [facility];  Service: Endoscopy;;   BREAST LUMPECTOMY     x2   ESOPHAGOGASTRODUODENOSCOPY Left 10/21/2020   Procedure: ESOPHAGOGASTRODUODENOSCOPY (EGD);  Surgeon: Carol Ada, MD;  Location: [facility];  Service: Endoscopy;  Laterality: Left;  Abnormal barium swallow   FOOT TENDON SURGERY     GALLBLADDER SURGERY     INCISIONAL HERNIA REPAIR N/A 03/01/2019   Procedure: OPEN INCISIONAL HERNIA REPAIR;  Surgeon: Armandina Gemma, MD;  Location: [facility];  Service: General;  Laterality: N/A;   INSERTION OF MESH N/A 03/01/2019   Procedure: INSERTION OF MESH;  Surgeon: Armandina Gemma, MD;  Location: [facility];  Service: General;  Laterality: N/A;   INTRAOPERATIVE TRANSTHORACIC ECHOCARDIOGRAM N/A 08/02/2017   Procedure: INTRAOPERATIVE TRANSTHORACIC ECHOCARDIOGRAM;  Surgeon: Burnell Blanks, MD;  Location: [facility];  Service: Open Heart Surgery;  Laterality: N/A;   REPLACEMENT TOTAL KNEE BILATERAL     RIGHT/LEFT HEART CATH AND CORONARY ANGIOGRAPHY N/A 07/05/2017   Procedure: RIGHT/LEFT HEART CATH AND CORONARY ANGIOGRAPHY;  Surgeon: Belva Crome, MD;  Location: [facility];  Service: Cardiovascular;  Laterality: N/A;   SKIN CANCER EXCISION  2018   right nostril    TOTAL ABDOMINAL HYSTERECTOMY     TRANSCATHETER AORTIC VALVE REPLACEMENT,  TRANSFEMORAL N/A 08/02/2017   Procedure: TRANSCATHETER AORTIC VALVE REPLACEMENT, TRANSFEMORAL;  Surgeon: Burnell Blanks, MD;  Location: [facility];  Service: Open Heart Surgery;  Laterality: N/A;    Current Medications: No outpatient medications have been marked as taking for the 04/13/22 encounter (Appointment) with Richardo Priest, MD.     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 [employer]  Tobacco Use   Smoking status: Former    Packs/day: 0.50    Years: 20.00    Total pack years: 10.00    Types: Cigarettes    Quit date: 03/15/1964    Years since quitting: 58.1   Smokeless tobacco: Never  Vaping Use   Vaping Use: Never used  Substance and Sexual Activity   Alcohol use: Yes    Comment: social   Drug use: No   Sexual activity: Not on file  Other Topics Concern   Not on file  Social History Narrative   Not on file   Social Determinants of Health   Financial Resource Strain: Not on file  Food Insecurity: Not on file  Transportation Needs: Not on file  Physical Activity: Not on file  Stress: Not on file  Social Connections: Not on file     Family History: The patient's ***family history includes Breast cancer in her sister; Heart disease in her father and maternal grandfather; Stroke in her mother.  ROS:   ROS Please see the history of present illness.    *** All other systems reviewed and are negative.  EKGs/Labs/Other Studies Reviewed:    The following studies were reviewed today: ***  EKG:  EKG is *** ordered today.  The ekg ordered today is personally reviewed and demonstrates ***  Recent Labs: 09/07/2021: ALT 16; BUN 18; Creatinine 1.1; Hemoglobin 9.4;  Platelets 422; Potassium 4.5; Sodium 136  Recent Lipid Panel No results found for: "CHOL", "TRIG", "HDL", "CHOLHDL", "VLDL", "LDLCALC", "LDLDIRECT"  Physical Exam:    VS:  There were no vitals taken for this visit.    Wt Readings from Last 3 Encounters:  03/19/22 148 lb (67.1 kg)  02/26/22 148 lb (67.1 kg)  09/07/21 146 lb 1.6 oz (66.3 kg)     GEN: *** Well nourished, well developed in no acute distress HEENT: Normal NECK: No JVD; No carotid bruits LYMPHATICS: No lymphadenopathy CARDIAC: ***RRR, no murmurs, rubs, gallops RESPIRATORY:  Clear to auscultation without rales, wheezing or rhonchi  ABDOMEN: Soft, non-tender, non-distended MUSCULOSKELETAL:  No edema; No deformity  SKIN: Warm and dry NEUROLOGIC:  Alert and oriented x 3 PSYCHIATRIC:  Normal affect     Signed, Shirlee More, MD  04/12/2022 12:15 PM    [facility]

## 2022-04-13 ENCOUNTER — Ambulatory Visit: Payer: Medicare Other | Admitting: Cardiology

## 2022-04-14 DIAGNOSIS — R5381 Other malaise: Secondary | ICD-10-CM | POA: Diagnosis not present

## 2022-04-14 DIAGNOSIS — G309 Alzheimer's disease, unspecified: Secondary | ICD-10-CM | POA: Diagnosis not present

## 2022-04-14 DIAGNOSIS — R4181 Age-related cognitive decline: Secondary | ICD-10-CM | POA: Diagnosis not present

## 2022-04-16 ENCOUNTER — Encounter (HOSPITAL_COMMUNITY)
Admission: EM | Disposition: A | Discharge status: Rehabilitation Facility | Payer: Self-pay | Source: Home / Self Care | Attending: Neurology

## 2022-04-16 ENCOUNTER — Other Ambulatory Visit: Payer: Self-pay

## 2022-04-16 ENCOUNTER — Emergency Department (HOSPITAL_COMMUNITY): Payer: Medicare Other

## 2022-04-16 ENCOUNTER — Emergency Department (HOSPITAL_COMMUNITY): Payer: Medicare Other | Admitting: Certified Registered"

## 2022-04-16 ENCOUNTER — Inpatient Hospital Stay (HOSPITAL_COMMUNITY): Payer: Medicare Other

## 2022-04-16 ENCOUNTER — Inpatient Hospital Stay (HOSPITAL_COMMUNITY)
Admission: EM | Admit: 2022-04-16 | Discharge: 2022-04-20 | DRG: 023 | Disposition: A | Discharge status: Rehabilitation Facility | Payer: Medicare Other | Attending: Internal Medicine | Admitting: Internal Medicine

## 2022-04-16 DIAGNOSIS — N179 Acute kidney failure, unspecified: Secondary | ICD-10-CM | POA: Diagnosis present

## 2022-04-16 DIAGNOSIS — E876 Hypokalemia: Secondary | ICD-10-CM | POA: Diagnosis present

## 2022-04-16 DIAGNOSIS — E039 Hypothyroidism, unspecified: Secondary | ICD-10-CM

## 2022-04-16 DIAGNOSIS — H903 Sensorineural hearing loss, bilateral: Secondary | ICD-10-CM | POA: Diagnosis present

## 2022-04-16 DIAGNOSIS — F5104 Psychophysiologic insomnia: Secondary | ICD-10-CM | POA: Diagnosis present

## 2022-04-16 DIAGNOSIS — I3139 Other pericardial effusion (noninflammatory): Secondary | ICD-10-CM | POA: Diagnosis present

## 2022-04-16 DIAGNOSIS — Z79890 Hormone replacement therapy (postmenopausal): Secondary | ICD-10-CM

## 2022-04-16 DIAGNOSIS — I63511 Cerebral infarction due to unspecified occlusion or stenosis of right middle cerebral artery: Secondary | ICD-10-CM | POA: Diagnosis present

## 2022-04-16 DIAGNOSIS — Z955 Presence of coronary angioplasty implant and graft: Secondary | ICD-10-CM

## 2022-04-16 DIAGNOSIS — I4891 Unspecified atrial fibrillation: Secondary | ICD-10-CM | POA: Diagnosis present

## 2022-04-16 DIAGNOSIS — F17210 Nicotine dependence, cigarettes, uncomplicated: Secondary | ICD-10-CM | POA: Diagnosis present

## 2022-04-16 DIAGNOSIS — I69392 Facial weakness following cerebral infarction: Secondary | ICD-10-CM | POA: Diagnosis not present

## 2022-04-16 DIAGNOSIS — Z8616 Personal history of COVID-19: Secondary | ICD-10-CM

## 2022-04-16 DIAGNOSIS — R29718 NIHSS score 18: Secondary | ICD-10-CM | POA: Diagnosis present

## 2022-04-16 DIAGNOSIS — I2510 Atherosclerotic heart disease of native coronary artery without angina pectoris: Secondary | ICD-10-CM | POA: Diagnosis present

## 2022-04-16 DIAGNOSIS — I130 Hypertensive heart and chronic kidney disease with heart failure and stage 1 through stage 4 chronic kidney disease, or unspecified chronic kidney disease: Secondary | ICD-10-CM | POA: Diagnosis present

## 2022-04-16 DIAGNOSIS — E785 Hyperlipidemia, unspecified: Secondary | ICD-10-CM | POA: Diagnosis present

## 2022-04-16 DIAGNOSIS — I509 Heart failure, unspecified: Secondary | ICD-10-CM | POA: Diagnosis not present

## 2022-04-16 DIAGNOSIS — I6389 Other cerebral infarction: Secondary | ICD-10-CM | POA: Diagnosis not present

## 2022-04-16 DIAGNOSIS — Z803 Family history of malignant neoplasm of breast: Secondary | ICD-10-CM

## 2022-04-16 DIAGNOSIS — Z888 Allergy status to other drugs, medicaments and biological substances status: Secondary | ICD-10-CM | POA: Diagnosis not present

## 2022-04-16 DIAGNOSIS — I252 Old myocardial infarction: Secondary | ICD-10-CM

## 2022-04-16 DIAGNOSIS — Z823 Family history of stroke: Secondary | ICD-10-CM

## 2022-04-16 DIAGNOSIS — Y838 Other surgical procedures as the cause of abnormal reaction of the patient, or of later complication, without mention of misadventure at the time of the procedure: Secondary | ICD-10-CM | POA: Diagnosis not present

## 2022-04-16 DIAGNOSIS — I639 Cerebral infarction, unspecified: Secondary | ICD-10-CM | POA: Diagnosis not present

## 2022-04-16 DIAGNOSIS — I6601 Occlusion and stenosis of right middle cerebral artery: Secondary | ICD-10-CM | POA: Diagnosis present

## 2022-04-16 DIAGNOSIS — I5032 Chronic diastolic (congestive) heart failure: Secondary | ICD-10-CM | POA: Diagnosis present

## 2022-04-16 DIAGNOSIS — I6340 Cerebral infarction due to embolism of unspecified cerebral artery: Secondary | ICD-10-CM | POA: Diagnosis present

## 2022-04-16 DIAGNOSIS — L7632 Postprocedural hematoma of skin and subcutaneous tissue following other procedure: Secondary | ICD-10-CM | POA: Diagnosis not present

## 2022-04-16 DIAGNOSIS — I110 Hypertensive heart disease with heart failure: Secondary | ICD-10-CM | POA: Diagnosis not present

## 2022-04-16 DIAGNOSIS — R079 Chest pain, unspecified: Secondary | ICD-10-CM | POA: Diagnosis present

## 2022-04-16 DIAGNOSIS — R0902 Hypoxemia: Secondary | ICD-10-CM | POA: Diagnosis present

## 2022-04-16 DIAGNOSIS — R29810 Facial weakness: Secondary | ICD-10-CM | POA: Diagnosis present

## 2022-04-16 DIAGNOSIS — G8194 Hemiplegia, unspecified affecting left nondominant side: Secondary | ICD-10-CM | POA: Diagnosis present

## 2022-04-16 DIAGNOSIS — I69354 Hemiplegia and hemiparesis following cerebral infarction affecting left non-dominant side: Secondary | ICD-10-CM | POA: Diagnosis not present

## 2022-04-16 DIAGNOSIS — Z91040 Latex allergy status: Secondary | ICD-10-CM

## 2022-04-16 DIAGNOSIS — F32A Depression, unspecified: Secondary | ICD-10-CM | POA: Diagnosis present

## 2022-04-16 DIAGNOSIS — R0689 Other abnormalities of breathing: Secondary | ICD-10-CM | POA: Diagnosis not present

## 2022-04-16 DIAGNOSIS — R471 Dysarthria and anarthria: Secondary | ICD-10-CM | POA: Diagnosis present

## 2022-04-16 DIAGNOSIS — Z853 Personal history of malignant neoplasm of breast: Secondary | ICD-10-CM

## 2022-04-16 DIAGNOSIS — Z66 Do not resuscitate: Secondary | ICD-10-CM | POA: Diagnosis present

## 2022-04-16 DIAGNOSIS — G4700 Insomnia, unspecified: Secondary | ICD-10-CM | POA: Diagnosis present

## 2022-04-16 DIAGNOSIS — Z85828 Personal history of other malignant neoplasm of skin: Secondary | ICD-10-CM

## 2022-04-16 DIAGNOSIS — Z96653 Presence of artificial knee joint, bilateral: Secondary | ICD-10-CM | POA: Diagnosis present

## 2022-04-16 DIAGNOSIS — R413 Other amnesia: Secondary | ICD-10-CM | POA: Diagnosis present

## 2022-04-16 DIAGNOSIS — J95821 Acute postprocedural respiratory failure: Secondary | ICD-10-CM | POA: Diagnosis not present

## 2022-04-16 DIAGNOSIS — F419 Anxiety disorder, unspecified: Secondary | ICD-10-CM | POA: Diagnosis present

## 2022-04-16 DIAGNOSIS — G458 Other transient cerebral ischemic attacks and related syndromes: Secondary | ICD-10-CM | POA: Diagnosis present

## 2022-04-16 DIAGNOSIS — D631 Anemia in chronic kidney disease: Secondary | ICD-10-CM | POA: Diagnosis present

## 2022-04-16 DIAGNOSIS — Z8673 Personal history of transient ischemic attack (TIA), and cerebral infarction without residual deficits: Secondary | ICD-10-CM

## 2022-04-16 DIAGNOSIS — N183 Chronic kidney disease, stage 3 unspecified: Secondary | ICD-10-CM | POA: Diagnosis present

## 2022-04-16 DIAGNOSIS — Z87891 Personal history of nicotine dependence: Secondary | ICD-10-CM

## 2022-04-16 DIAGNOSIS — Z8249 Family history of ischemic heart disease and other diseases of the circulatory system: Secondary | ICD-10-CM

## 2022-04-16 DIAGNOSIS — C50912 Malignant neoplasm of unspecified site of left female breast: Secondary | ICD-10-CM | POA: Diagnosis present

## 2022-04-16 DIAGNOSIS — K219 Gastro-esophageal reflux disease without esophagitis: Secondary | ICD-10-CM | POA: Diagnosis present

## 2022-04-16 DIAGNOSIS — Z90710 Acquired absence of both cervix and uterus: Secondary | ICD-10-CM

## 2022-04-16 DIAGNOSIS — Z952 Presence of prosthetic heart valve: Secondary | ICD-10-CM | POA: Diagnosis not present

## 2022-04-16 DIAGNOSIS — R030 Elevated blood-pressure reading, without diagnosis of hypertension: Secondary | ICD-10-CM | POA: Diagnosis present

## 2022-04-16 DIAGNOSIS — R414 Neurologic neglect syndrome: Secondary | ICD-10-CM | POA: Diagnosis present

## 2022-04-16 DIAGNOSIS — M1990 Unspecified osteoarthritis, unspecified site: Secondary | ICD-10-CM | POA: Diagnosis present

## 2022-04-16 DIAGNOSIS — H518 Other specified disorders of binocular movement: Secondary | ICD-10-CM | POA: Diagnosis present

## 2022-04-16 DIAGNOSIS — Z79899 Other long term (current) drug therapy: Secondary | ICD-10-CM

## 2022-04-16 DIAGNOSIS — F05 Delirium due to known physiological condition: Secondary | ICD-10-CM | POA: Diagnosis not present

## 2022-04-16 HISTORY — PX: IR CT HEAD LTD: IMG2386

## 2022-04-16 HISTORY — PX: IR PERCUTANEOUS ART THROMBECTOMY/INFUSION INTRACRANIAL INC DIAG ANGIO: IMG6087

## 2022-04-16 HISTORY — PX: RADIOLOGY WITH ANESTHESIA: SHX6223

## 2022-04-16 LAB — CBG MONITORING, ED: Glucose-Capillary: 168 mg/dL — ABNORMAL HIGH (ref 70–99)

## 2022-04-16 LAB — DIFFERENTIAL
Abs Immature Granulocytes: 0.02 10*3/uL (ref 0.00–0.07)
Basophils Absolute: 0 10*3/uL (ref 0.0–0.1)
Basophils Relative: 1 %
Eosinophils Absolute: 0.2 10*3/uL (ref 0.0–0.5)
Eosinophils Relative: 4 %
Immature Granulocytes: 1 %
Lymphocytes Relative: 26 %
Lymphs Abs: 1.1 10*3/uL (ref 0.7–4.0)
Monocytes Absolute: 0.3 10*3/uL (ref 0.1–1.0)
Monocytes Relative: 7 %
Neutro Abs: 2.7 10*3/uL (ref 1.7–7.7)
Neutrophils Relative %: 61 %

## 2022-04-16 LAB — COMPREHENSIVE METABOLIC PANEL
ALT: 22 U/L (ref 0–44)
AST: 31 U/L (ref 15–41)
Albumin: 3.4 g/dL — ABNORMAL LOW (ref 3.5–5.0)
Alkaline Phosphatase: 53 U/L (ref 38–126)
Anion gap: 12 (ref 5–15)
BUN: 15 mg/dL (ref 8–23)
CO2: 23 mmol/L (ref 22–32)
Calcium: 9 mg/dL (ref 8.9–10.3)
Chloride: 102 mmol/L (ref 98–111)
Creatinine, Ser: 1.61 mg/dL — ABNORMAL HIGH (ref 0.44–1.00)
GFR, Estimated: 29 mL/min — ABNORMAL LOW (ref >60)
Glucose, Bld: 176 mg/dL — ABNORMAL HIGH (ref 70–99)
Potassium: 3.3 mmol/L — ABNORMAL LOW (ref 3.5–5.1)
Sodium: 137 mmol/L (ref 135–145)
Total Bilirubin: 0.7 mg/dL (ref 0.3–1.2)
Total Protein: 6.2 g/dL — ABNORMAL LOW (ref 6.5–8.1)

## 2022-04-16 LAB — POCT I-STAT 7, (LYTES, BLD GAS, ICA,H+H)
Acid-base deficit: 1 mmol/L (ref 0.0–2.0)
Bicarbonate: 23.7 mmol/L (ref 20.0–28.0)
Calcium, Ion: 1.2 mmol/L (ref 1.15–1.40)
HCT: 35 % — ABNORMAL LOW (ref 36.0–46.0)
Hemoglobin: 11.9 g/dL — ABNORMAL LOW (ref 12.0–15.0)
O2 Saturation: 99 %
Patient temperature: 97.7
Potassium: 3.6 mmol/L (ref 3.5–5.1)
Sodium: 137 mmol/L (ref 135–145)
TCO2: 25 mmol/L (ref 22–32)
pCO2 arterial: 39.3 mmHg (ref 32–48)
pH, Arterial: 7.387 (ref 7.35–7.45)
pO2, Arterial: 135 mmHg — ABNORMAL HIGH (ref 83–108)

## 2022-04-16 LAB — PROTIME-INR
INR: 1.1 (ref 0.8–1.2)
Prothrombin Time: 14.2 seconds (ref 11.4–15.2)

## 2022-04-16 LAB — HEMOGLOBIN AND HEMATOCRIT, BLOOD
HCT: 31.6 % — ABNORMAL LOW (ref 36.0–46.0)
Hemoglobin: 10.1 g/dL — ABNORMAL LOW (ref 12.0–15.0)

## 2022-04-16 LAB — CBC
HCT: 35.9 % — ABNORMAL LOW (ref 36.0–46.0)
Hemoglobin: 11.1 g/dL — ABNORMAL LOW (ref 12.0–15.0)
MCH: 27.1 pg (ref 26.0–34.0)
MCHC: 30.9 g/dL (ref 30.0–36.0)
MCV: 87.6 fL (ref 80.0–100.0)
Platelets: 272 10*3/uL (ref 150–400)
RBC: 4.1 MIL/uL (ref 3.87–5.11)
RDW: 22.2 % — ABNORMAL HIGH (ref 11.5–15.5)
WBC: 4.4 10*3/uL (ref 4.0–10.5)
nRBC: 0 % (ref 0.0–0.2)

## 2022-04-16 LAB — ETHANOL: Alcohol, Ethyl (B): <10 mg/dL (ref <10)

## 2022-04-16 LAB — I-STAT CHEM 8, ED
BUN: 17 mg/dL (ref 8–23)
Calcium, Ion: 1.11 mmol/L — ABNORMAL LOW (ref 1.15–1.40)
Chloride: 102 mmol/L (ref 98–111)
Creatinine, Ser: 1.6 mg/dL — ABNORMAL HIGH (ref 0.44–1.00)
Glucose, Bld: 178 mg/dL — ABNORMAL HIGH (ref 70–99)
HCT: 37 % (ref 36.0–46.0)
Hemoglobin: 12.6 g/dL (ref 12.0–15.0)
Potassium: 3.3 mmol/L — ABNORMAL LOW (ref 3.5–5.1)
Sodium: 138 mmol/L (ref 135–145)
TCO2: 23 mmol/L (ref 22–32)

## 2022-04-16 LAB — SARS CORONAVIRUS 2 BY RT PCR: SARS Coronavirus 2 by RT PCR: POSITIVE — AB

## 2022-04-16 LAB — MAGNESIUM: Magnesium: 1.7 mg/dL (ref 1.7–2.4)

## 2022-04-16 LAB — APTT: aPTT: 30 seconds (ref 24–36)

## 2022-04-16 SURGERY — IR WITH ANESTHESIA
Anesthesia: General

## 2022-04-16 MED ORDER — PHENYLEPHRINE HCL-NACL 20-0.9 MG/250ML-% IV SOLN
INTRAVENOUS | Status: DC | PRN
  Administered 2022-04-16: 50 ug/min via INTRAVENOUS

## 2022-04-16 MED ORDER — PROPOFOL 500 MG/50ML IV EMUL
INTRAVENOUS | Status: DC | PRN
  Administered 2022-04-16: 75 ug/kg/min via INTRAVENOUS

## 2022-04-16 MED ORDER — EPTIFIBATIDE 20 MG/10ML IV SOLN
INTRAVENOUS | Status: AC
  Filled 2022-04-16: qty 10

## 2022-04-16 MED ORDER — ACETAMINOPHEN 160 MG/5ML PO SOLN
650.0000 mg | ORAL | Status: DC | PRN
  Administered 2022-04-19: 650 mg
  Filled 2022-04-16: qty 20.3

## 2022-04-16 MED ORDER — LABETALOL HCL 5 MG/ML IV SOLN
INTRAVENOUS | Status: DC | PRN
  Administered 2022-04-16: 10 mg via INTRAVENOUS

## 2022-04-16 MED ORDER — ACETAMINOPHEN 650 MG RE SUPP: 650.0000 mg | RECTAL | Status: DC | PRN

## 2022-04-16 MED ORDER — FENTANYL CITRATE PF 50 MCG/ML IJ SOSY: 25.0000 ug | PREFILLED_SYRINGE | INTRAMUSCULAR | Status: DC | PRN

## 2022-04-16 MED ORDER — FENTANYL CITRATE (PF) 250 MCG/5ML IJ SOLN
INTRAMUSCULAR | Status: DC | PRN
  Administered 2022-04-16: 100 ug via INTRAVENOUS

## 2022-04-16 MED ORDER — PROPOFOL 1000 MG/100ML IV EMUL
0.0000 ug/kg/min | INTRAVENOUS | Status: DC
  Administered 2022-04-16: 50 ug/kg/min via INTRAVENOUS
  Administered 2022-04-16: 38 ug/kg/min via INTRAVENOUS
  Administered 2022-04-17: 18 ug/kg/min via INTRAVENOUS
  Filled 2022-04-16 (×2): qty 100

## 2022-04-16 MED ORDER — ACETAMINOPHEN 325 MG PO TABS
650.0000 mg | ORAL_TABLET | ORAL | Status: DC | PRN
  Filled 2022-04-16: qty 2

## 2022-04-16 MED ORDER — FENTANYL CITRATE (PF) 100 MCG/2ML IJ SOLN
INTRAMUSCULAR | Status: AC
  Filled 2022-04-16: qty 2

## 2022-04-16 MED ORDER — ACETAMINOPHEN 325 MG PO TABS
650.0000 mg | ORAL_TABLET | ORAL | Status: DC | PRN
  Administered 2022-04-17 – 2022-04-19 (×2): 650 mg via ORAL
  Filled 2022-04-16: qty 2

## 2022-04-16 MED ORDER — DOCUSATE SODIUM 50 MG/5ML PO LIQD
100.0000 mg | Freq: Two times a day (BID) | ORAL | Status: DC
  Administered 2022-04-19: 100 mg
  Filled 2022-04-16 (×4): qty 10

## 2022-04-16 MED ORDER — STROKE: EARLY STAGES OF RECOVERY BOOK
Freq: Once | Status: AC
  Filled 2022-04-16: qty 1

## 2022-04-16 MED ORDER — SODIUM CHLORIDE 0.9 % IV SOLN: INTRAVENOUS | Status: DC

## 2022-04-16 MED ORDER — PHENYLEPHRINE 80 MCG/ML (10ML) SYRINGE FOR IV PUSH (FOR BLOOD PRESSURE SUPPORT)
PREFILLED_SYRINGE | INTRAVENOUS | Status: DC | PRN
  Administered 2022-04-16 (×2): 160 ug via INTRAVENOUS
  Administered 2022-04-16: 80 ug via INTRAVENOUS
  Administered 2022-04-16: 160 ug via INTRAVENOUS

## 2022-04-16 MED ORDER — ORAL CARE MOUTH RINSE
15.0000 mL | OROMUCOSAL | Status: DC
  Administered 2022-04-16 – 2022-04-17 (×11): 15 mL via OROMUCOSAL

## 2022-04-16 MED ORDER — ROCURONIUM BROMIDE 10 MG/ML (PF) SYRINGE
PREFILLED_SYRINGE | INTRAVENOUS | Status: DC | PRN
  Administered 2022-04-16: 100 mg via INTRAVENOUS

## 2022-04-16 MED ORDER — CLEVIDIPINE BUTYRATE 0.5 MG/ML IV EMUL
0.0000 mg/h | INTRAVENOUS | Status: DC
  Administered 2022-04-16: 3 mg/h via INTRAVENOUS
  Administered 2022-04-16: 2 mg/h via INTRAVENOUS
  Administered 2022-04-17: 7 mg/h via INTRAVENOUS
  Administered 2022-04-17: 2 mg/h via INTRAVENOUS
  Administered 2022-04-17: 8 mg/h via INTRAVENOUS
  Administered 2022-04-18: 14 mg/h via INTRAVENOUS
  Administered 2022-04-18: 8 mg/h via INTRAVENOUS
  Filled 2022-04-16 (×10): qty 50

## 2022-04-16 MED ORDER — CLEVIDIPINE BUTYRATE 0.5 MG/ML IV EMUL
INTRAVENOUS | Status: DC | PRN
  Administered 2022-04-16: 2 mg/h via INTRAVENOUS

## 2022-04-16 MED ORDER — ORAL CARE MOUTH RINSE: 15.0000 mL | OROMUCOSAL | Status: DC | PRN

## 2022-04-16 MED ORDER — IOHEXOL 300 MG/ML  SOLN
150.0000 mL | Freq: Once | INTRAMUSCULAR | Status: AC | PRN
  Administered 2022-04-16: 75 mL via INTRA_ARTERIAL

## 2022-04-16 MED ORDER — NICARDIPINE HCL IN NACL 20-0.86 MG/200ML-% IV SOLN: 0.0000 mg/h | INTRAVENOUS | Status: DC

## 2022-04-16 MED ORDER — FAMOTIDINE 20 MG PO TABS: 20.0000 mg | ORAL_TABLET | Freq: Two times a day (BID) | ORAL | Status: DC

## 2022-04-16 MED ORDER — TICAGRELOR 90 MG PO TABS
ORAL_TABLET | ORAL | Status: AC
  Filled 2022-04-16: qty 2

## 2022-04-16 MED ORDER — ACETAMINOPHEN 160 MG/5ML PO SOLN: 650.0000 mg | ORAL | Status: DC | PRN

## 2022-04-16 MED ORDER — ASPIRIN 81 MG PO CHEW
CHEWABLE_TABLET | ORAL | Status: AC
  Filled 2022-04-16: qty 1

## 2022-04-16 MED ORDER — POTASSIUM CHLORIDE 10 MEQ/100ML IV SOLN
10.0000 meq | INTRAVENOUS | Status: AC
  Administered 2022-04-16 – 2022-04-17 (×6): 10 meq via INTRAVENOUS
  Filled 2022-04-16 (×6): qty 100

## 2022-04-16 MED ORDER — PANTOPRAZOLE SODIUM 40 MG IV SOLR
40.0000 mg | Freq: Every day | INTRAVENOUS | Status: DC
  Administered 2022-04-16 – 2022-04-19 (×4): 40 mg via INTRAVENOUS
  Filled 2022-04-16 (×4): qty 10

## 2022-04-16 MED ORDER — VERAPAMIL HCL 2.5 MG/ML IV SOLN
INTRAVENOUS | Status: AC
  Filled 2022-04-16: qty 2

## 2022-04-16 MED ORDER — PROPOFOL 10 MG/ML IV BOLUS
INTRAVENOUS | Status: DC | PRN
  Administered 2022-04-16: 30 mg via INTRAVENOUS
  Administered 2022-04-16: 80 mg via INTRAVENOUS

## 2022-04-16 MED ORDER — DEXAMETHASONE SODIUM PHOSPHATE 10 MG/ML IJ SOLN
INTRAMUSCULAR | Status: DC | PRN
  Administered 2022-04-16: 5 mg via INTRAVENOUS

## 2022-04-16 MED ORDER — CANGRELOR TETRASODIUM 50 MG IV SOLR
INTRAVENOUS | Status: AC
  Filled 2022-04-16: qty 50

## 2022-04-16 MED ORDER — LIDOCAINE 2% (20 MG/ML) 5 ML SYRINGE
INTRAMUSCULAR | Status: DC | PRN
  Administered 2022-04-16: 100 mg via INTRAVENOUS

## 2022-04-16 MED ORDER — NITROGLYCERIN 1 MG/10 ML FOR IR/CATH LAB
INTRA_ARTERIAL | Status: AC
  Filled 2022-04-16: qty 10

## 2022-04-16 MED ORDER — IOHEXOL 350 MG/ML SOLN
75.0000 mL | Freq: Once | INTRAVENOUS | Status: AC | PRN
  Administered 2022-04-16: 75 mL via INTRAVENOUS

## 2022-04-16 MED ORDER — SODIUM CHLORIDE 0.9 % IV SOLN: INTRAVENOUS | Status: DC | PRN

## 2022-04-16 MED ORDER — POLYETHYLENE GLYCOL 3350 17 G PO PACK
17.0000 g | PACK | Freq: Every day | ORAL | Status: DC
  Administered 2022-04-18 – 2022-04-20 (×3): 17 g
  Filled 2022-04-16 (×3): qty 1

## 2022-04-16 MED ORDER — CLOPIDOGREL BISULFATE 300 MG PO TABS
ORAL_TABLET | ORAL | Status: AC
  Filled 2022-04-16: qty 1

## 2022-04-16 MED ORDER — ONDANSETRON HCL 4 MG/2ML IJ SOLN
INTRAMUSCULAR | Status: DC | PRN
  Administered 2022-04-16: 4 mg via INTRAVENOUS

## 2022-04-16 MED ORDER — TENECTEPLASE FOR STROKE
0.2500 mg/kg | PACK | Freq: Once | INTRAVENOUS | Status: AC
  Administered 2022-04-16: 16 mg via INTRAVENOUS
  Filled 2022-04-16: qty 10

## 2022-04-16 MED ORDER — FAMOTIDINE IN NACL 20-0.9 MG/50ML-% IV SOLN: 20.0000 mg | Freq: Two times a day (BID) | INTRAVENOUS | Status: DC

## 2022-04-16 MED ORDER — SUCCINYLCHOLINE CHLORIDE 200 MG/10ML IV SOSY
PREFILLED_SYRINGE | INTRAVENOUS | Status: DC | PRN
  Administered 2022-04-16: 100 mg via INTRAVENOUS

## 2022-04-16 MED ORDER — NITROGLYCERIN 1 MG/10 ML FOR IR/CATH LAB
INTRA_ARTERIAL | Status: DC | PRN
  Administered 2022-04-16 (×2): 25 ug via INTRA_ARTERIAL

## 2022-04-16 MED ORDER — TIROFIBAN HCL IN NACL 5-0.9 MG/100ML-% IV SOLN
INTRAVENOUS | Status: AC
  Filled 2022-04-16: qty 100

## 2022-04-16 NOTE — Sedation Documentation (Signed)
1730 and 1745 assessment

## 2022-04-16 NOTE — Consult Note (Signed)
NAME:  Lunna Vogelgesang, MRN:  [id number], DOB:  Jun 17, 1925, LOS: 0 ADMISSION DATE:  04/16/2022 CONSULTATION DATE:  04/16/2022 REFERRING MD:  Genevie Cheshire CHIEF COMPLAINT:  R MCA occlusion   History of Present Illness:  89 year old woman who presented to [facility] on 2/2 as a Code Stroke. LKW 1500; c/o CP and EMS was called. On EMS arrival patient had dysarthria and L-sided weakness/neglect with R gaze deviation. Brought to [facility] for further evaluation. PMHx significant for HTN, HLD, CAD (s/p DES to RCA 2010), AS s/p TAVR (2019), chronic diastolic HF (Echo 3818 EF 50-55%, mod LVH), Afib (not on AC), CKD stage III, hypothyroidism, breast CA, GERD, depression/anxiety.  On ED arrival, Code Stroke initiated. CT Head demonstrated NAICA; CTA Head/Neck positive for R MCA M1 occlusion. TNK was administered 1618 (slight delay due to elevated BP). Cleviprex initiated. Taken to [facility] for mechanical thrombectomy (Dr. Estanislado Pandy) with successful recanalization (TICI 3). Post-procedure CT Head negative for ICH. Of note, manual pressure/QuickClot needed to R groin access site due to Angio-Seal closure device failure. Patient incidentally COVID positive and left intubated post-procedure.  PCCM consulted for management of MV/hemodynamics.  Pertinent Medical History:   Past Medical History:  Diagnosis Date   Abnormal gait 06/24/2015   Last Assessment & Plan:   Relevant Hx:  Course:  Daily Update:  Today's Plan:uses her cane for this and no recent falls     Electronically signed by: Mayer Camel, NP  07/23/15 1529   Acute cough 08/25/2011   Acute kidney injury superimposed on chronic kidney disease ([place]) 10/18/2020   Anemia due to stage 3 chronic kidney disease ([place]) 06/24/2015   Last Assessment & Plan: Formatting of this note might be different from the original. Relevant Hx: Course: Daily Update: Today's Plan:update her CMP for her Electronically signed by: Mayer Camel, NP 07/23/15 1528  Formatting of this note might be different from the original. Last Assessment & Plan: Relevant Hx: Course: Daily Update: Today's Plan:update her CMP for her Electronically    Anxiety 06/24/2015   Last Assessment & Plan:   Relevant Hx:  Course:  Daily Update:  Today's Plan:this is stable for her at this time     Electronically signed by: Mayer Camel, NP  07/23/15 1529   Aortic stenosis, severe    a. 07/2017: s/p TAVR w/ an Edwards Sapien 3 THV (size 26 mm, model # [id number], serial # [id number])   Atherosclerosis of coronary artery 06/24/2015   Formatting of this note might be different from the original. Ostial RCA DES, 2010 Last Assessment & Plan: stable Last Assessment & Plan: Relevant Hx: Course: Daily Update: Today's Plan:she has felt she was stable from this and she has cardiologist she has followed with . Electronically signed by: Mayer Camel, NP 07/23/15 1523 Ostial RCA DES, 2010 Last Assessment & Plan: stable   Atrial fibrillation ([place]) 03/19/2022   Bilateral hearing loss 06/25/2015   Bilateral sensorineural hearing loss 12/26/2020   Breast cancer, right breast ([place]) 03/24/2008   CAD (coronary artery disease)    a. 2010: s/p stent to RCA   Chronic diastolic (congestive) heart failure ([place]) 08/04/2017   Chronic kidney disease, stage III (moderate) (HCC)    Chronic recurrent major depressive disorder ([place]) 06/24/2015   Last Assessment & Plan:   Relevant Hx:  Course:  Daily Update:  Today's Plan:this is stable for her     Electronically signed by: Mayer Camel, NP  07/23/15 1530   Chronic  sinusitis    Coronary artery disease involving native coronary artery of native heart with angina pectoris ([place]) 06/24/2015   Formatting of this note might be different from the original. Overview: Ostial RCA DES, 2010 Last Assessment & Plan: stable Last Assessment & Plan: Formatting of this note might be different from the original. Relevant Hx: Course: Daily Update:  Today's Plan:she has felt she was stable from this and she has cardiologist she has followed with . Electronically signed by: Mayer Camel, N   Coronary artery disease involving native coronary artery of native heart without angina pectoris    Ostial RCA DES, 2010   Decreased hearing of both ears 06/25/2015   Deficiency anemia 03/11/2021   Dehydration 10/19/2020   Depressive disorder    Dizziness 08/14/2018   Drusen of macula of both eyes 09/12/2018   Ductal carcinoma in situ of breast 06/23/2007   Encounter for long-term current use of high risk medication 06/24/2015   Epiretinal membrane (ERM) of left eye 09/12/2018   Essential hypertension, benign 02/16/2013   Gastro-esophageal reflux disease with esophagitis 06/24/2015   Last Assessment & Plan:   Relevant Hx:  Course:  Daily Update:  Today's Plan:she is stable from this      Electronically signed by: Mayer Camel, NP  07/23/15 1526   Gastroesophageal reflux disease without esophagitis 12/27/2016   History of breast cancer    a. s/p lumpectomies and XRT   Hyperlipemia    Hyperlipidemia 02/16/2013   Hypertension    Hypothyroid    Hypothyroidism, acquired 06/24/2015   Last Assessment & Plan:   Relevant Hx:  Course:  Daily Update:  Today's Plan:update her TSH for her today     Electronically signed by: Mayer Camel, NP  07/23/15 1526   Incisional hernia, without obstruction or gangrene 02/24/2019   Insomnia    Intermediate stage nonexudative age-related macular degeneration of both eyes 01/19/2021   Irritation of oral cavity 11/23/2019   Malaise and fatigue 06/24/2015   Last Assessment & Plan:   Relevant Hx:  Course:  Daily Update:  Today's Plan:she is up and down with her energy and she feels that for the most part she has been stable     Electronically signed by: Mayer Camel, NP  07/23/15 1533   Malignant neoplasm of central portion of left female breast ([place]) 06/24/2015   Last  Assessment & Plan:   Relevant Hx:  Course:  Daily Update:  Today's Plan:she is now receiving injections once a month for this and is hopeful will keep this at bay as this has been recurrent     Electronically signed by: Mayer Camel, NP  07/23/15 1530   Melena 03/19/2022   Memory change 07/23/2015   Last Assessment & Plan:   Relevant Hx:  Course:  Daily Update:  Today's Plan:she is going to take the aricept at supper     Electronically signed by: Mayer Camel, NP  07/23/15 1532   Memory loss    Multiple thyroid nodules 02/09/2012   Osteoarthritis    Osteoporosis 03/24/2020   Other allergic rhinitis 12/27/2016   Plantar fat pad atrophy 01/19/2016   Posterior vitreous detachment of left eye 09/12/2018   Primary insomnia 06/24/2015   Last Assessment & Plan:   Relevant Hx:  Course:  Daily Update:  Today's Plan:she feels that this is stable for her at this time     Electronically signed by: Mayer Camel, NP  07/23/15 1531  Primary localized osteoarthrosis, ankle and foot 01/15/2016   Primary osteoarthritis involving multiple joints 06/24/2015   Last Assessment & Plan:   Relevant Hx:  Course:  Daily Update:  Today's Plan:she is stable from her joint though she has discomfort if she overexerts.     Electronically signed by: Mayer Camel, NP  07/23/15 1528   Reflux esophagitis    S/P TAVR (transcatheter aortic valve replacement) 08/02/2017   Severe aortic stenosis 02/16/2013   Physician Review  Conclusions: 1. Mild concentric left ventricular hypertrophy.  2. Left ventricular ejection fraction estimated by 2D at 60-65 percent.  3. There were no regional wall motion abnormalities.  4. Mild mitral annular calcification.  5. Trace mitral valve regurgitation.  6. Trivial tricuspid regurgitation.  7. Moderate increased thickness and calcification of the trileaflet aortic val   Stage III chronic kidney disease ([place]) 06/24/2015   Last Assessment & Plan:    Relevant Hx:  Course:  Daily Update:  Today's Plan:update her CMP for her     Electronically signed by: Mayer Camel, NP  07/23/15 1528   Thyroid nodule 06/24/2015   Vitreous membranes and strands 01/19/2021   PPV on 02/11/2021 OS   Significant Hospital Events: Including procedures, antibiotic start and stop dates in addition to other pertinent events   2/2 - Presented to [facility] ED via EMS for CP, then L-sided weakness/neglect, R gaze deviation and dysarthria. LKW 1500. CT Head negative, CTA Head/Neck +R MCA M1 occlusion. TNK given 1618. Taken to [facility] for mechanical thrombectomy with recanalization of vessel (TICI 3). COVID+, left intubated. PCCM consulted for MV.  Interim History / Subjective:  S/p MT with NIR Left intubated for COVID+ status PCCM consulted for management of MV/hemodynamics  Objective:  Blood pressure (!) 177/102, height '4\' 11"'$  (1.499 m), weight 64.6 kg, SpO2 97 %.       No intake or output data in the 24 hours ending 04/16/22 1750 Filed Weights   04/16/22 1600  Weight: 64.6 kg   Physical Examination: General: Acutely ill-appearing elderly woman in NAD. Intubated, sedated. HEENT: [place]/AT, anicteric sclera, PERRL 7m, moist mucous membranes. ETT in place. Neuro: Sedated. Does not respond to verbal, tactile or noxious stimuli. Does not withdraw to pain.Not following commands. No spontaneous movement of extremities noted. No cough/gag due to deep sedation. CV: RRR, no m/g/r. PULM: Breathing even and unlabored on vent (PEEP 5, FiO2 100%). Lung fields CTAB. GI: Soft, nontender, nondistended. Hypoactive bowel sounds. Extremities: Trace bilateral LE edema noted. R groin with pressure dressing in place, no active bleeding, firm to palpation. Bilateral DP pulses +1, palpable. Skin: Warm/dry, no rashes. Scattered ecchymosis.  Resolved Hospital Problem List:    Assessment & Plan:  Right MCA M1 occlusion S/p mechanical thrombectomy, TNK [facility], then L-sided weakness/neglect, R gaze deviation and dysarthria. LKW 1500. CT Head negative, CTA Head/Neck +R MCA M1 occlusion. TNK given 1618[facility] - Admit to ICU post-procedure - Goal SBP 120-140 - Cleviprex titrated to goal SBP - Frequent neuro checks - Neuroprotective measures: HOB > 30 degrees, normoglycemia, normothermia, electrolytes WNL - Repeat CT Head at 24H - MRI Brain without contrast - Obtain Echo - F/u A1c, lipid panel - PT/OT/SLP when able to participate in care  Post-procedure R groin hematoma 2/2 Angio-Seal closure device Required manual compression and QuickClot x 1hr 152m for hemostasis. - Monitor groin site for active bleeding/enlarging hematoma - Trend  H&H - Transfuse for Hgb < 7.0 or hemodynamically significant bleeding  Periprocedural respiratory insufficiency requiring mechanical ventilation COVID-19 infection - Continue full vent support (4-8cc/kg IBW), wean as able pending mental status - Wean FiO2 for O2 sat > 90% - Daily WUA/SBT - VAP bundle - Pulmonary hygiene - PAD protocol for sedation: Propofol and Fentanyl for goal RASS 0 to -1 - F/u CXR - Post-intubation ABG  Atrial fibrillation Severe AS s/p TAVR - Cardiac monitoring - Optimize electrolytes (K > 4, Mg > 2) - Discuss AC post-procedure  HTN HLD - Hold home antihypertensives - Cleviprex as above - Goal SBP 120-140 - Resume ASA/statin  AKI on CKD stage III Cr 1.61 on admission, baseline 1.1. - Trend BMP - Replete electrolytes as indicated - Monitor I&Os - F/u urine studies - Avoid nephrotoxic agents as able - Ensure adequate renal perfusion  Hypothyroidism - Continue home levothyroxine  Best Practice: (right click and "Reselect all SmartList Selections" daily)   Diet/type: NPO DVT prophylaxis: SCDs, AC per Neuro/NIR GI prophylaxis: PPI Lines: N/A Foley:  N/A Code Status:  DNR Last date of  multidisciplinary goals of care discussion [Per Primary Team]  Labs:  CBC: Recent Labs  Lab 04/16/22 1609 04/16/22 1616  WBC  --  4.4  HGB 12.6 11.1*  HCT 37.0 35.9*  MCV  --  87.6  PLT  --  299   Basic Metabolic Panel: Recent Labs  Lab 04/16/22 1609 04/16/22 1616  NA 138 137  K 3.3* 3.3*  CL 102 102  CO2  --  23  GLUCOSE 178* 176*  BUN 17 15  CREATININE 1.60* 1.61*  CALCIUM  --  9.0   GFR: Estimated Creatinine Clearance: 17.5 mL/min (A) (by C-G formula based on SCr of 1.61 mg/dL (H)). Recent Labs  Lab 04/16/22 1616  WBC 4.4   Liver Function Tests: Recent Labs  Lab 04/16/22 1616  AST 31  ALT 22  ALKPHOS 53  BILITOT 0.7  PROT 6.2*  ALBUMIN 3.4*   No results for input(s): "LIPASE", "AMYLASE" in the last 168 hours. No results for input(s): "AMMONIA" in the last 168 hours.  ABG:    Component Value Date/Time   PHART 7.391 07/29/2017 1332   PCO2ART 41.5 07/29/2017 1332   PO2ART 84.2 07/29/2017 1332   HCO3 24.6 07/29/2017 1332   TCO2 23 04/16/2022 1609   ACIDBASEDEF 1.0 07/05/2017 1544   O2SAT 95.5 07/29/2017 1332    Coagulation Profile: Recent Labs  Lab 04/16/22 1616  INR 1.1   Cardiac Enzymes: No results for input(s): "CKTOTAL", "CKMB", "CKMBINDEX", "TROPONINI" in the last 168 hours.  HbA1C: Hgb A1c MFr Bld  Date/Time Value Ref Range Status  07/29/2017 01:30 PM 5.3 4.8 - 5.6 % Final    Comment:    (NOTE) Pre diabetes:          5.7%-6.4% Diabetes:              >6.4% Glycemic control for   <7.0% adults with diabetes    CBG: Recent Labs  Lab 04/16/22 1604  GLUCAP 168*   Review of Systems:   Patient is encephalopathic and/or intubated. Therefore history has been obtained from chart review.   Past Medical History:  She,  has a past medical history of Abnormal gait (06/24/2015), Acute cough (08/25/2011), Acute kidney injury superimposed on chronic kidney disease ([place]) (10/18/2020), Anemia due to stage 3 chronic kidney disease ([place])  (06/24/2015), Anxiety (06/24/2015), Aortic stenosis, severe, Atherosclerosis of coronary artery (06/24/2015), Atrial fibrillation (  [place]) (03/19/2022), Bilateral hearing loss (06/25/2015), Bilateral sensorineural hearing loss (12/26/2020), Breast cancer, right breast ([place]) (03/24/2008), CAD (coronary artery disease), Chronic diastolic (congestive) heart failure ([place]) (08/04/2017), Chronic kidney disease, stage III (moderate) (HCC), Chronic recurrent major depressive disorder ([place]) (06/24/2015), Chronic sinusitis, Coronary artery disease involving native coronary artery of native heart with angina pectoris ([place]) (06/24/2015), Coronary artery disease involving native coronary artery of native heart without angina pectoris, Decreased hearing of both ears (06/25/2015), Deficiency anemia (03/11/2021), Dehydration (10/19/2020), Depressive disorder, Dizziness (08/14/2018), Drusen of macula of both eyes (09/12/2018), Ductal carcinoma in situ of breast (06/23/2007), Encounter for long-term current use of high risk medication (06/24/2015), Epiretinal membrane (ERM) of left eye (09/12/2018), Essential hypertension, benign (02/16/2013), Gastro-esophageal reflux disease with esophagitis (06/24/2015), Gastroesophageal reflux disease without esophagitis (12/27/2016), History of breast cancer, Hyperlipemia, Hyperlipidemia (02/16/2013), Hypertension, Hypothyroid, Hypothyroidism, acquired (06/24/2015), Incisional hernia, without obstruction or gangrene (02/24/2019), Insomnia, Intermediate stage nonexudative age-related macular degeneration of both eyes (01/19/2021), Irritation of oral cavity (11/23/2019), Malaise and fatigue (06/24/2015), Malignant neoplasm of central portion of left female breast ([place]) (06/24/2015), Melena (03/19/2022), Memory change (07/23/2015), Memory loss, Multiple thyroid nodules (02/09/2012), Osteoarthritis, Osteoporosis (03/24/2020), Other allergic rhinitis (12/27/2016), Plantar fat pad atrophy (01/19/2016),  Posterior vitreous detachment of left eye (09/12/2018), Primary insomnia (06/24/2015), Primary localized osteoarthrosis, ankle and foot (01/15/2016), Primary osteoarthritis involving multiple joints (06/24/2015), Reflux esophagitis, S/P TAVR (transcatheter aortic valve replacement) (08/02/2017), Severe aortic stenosis (02/16/2013), Stage III chronic kidney disease ([place]) (06/24/2015), Thyroid nodule (06/24/2015), and Vitreous membranes and strands (01/19/2021).   Surgical History:   Past Surgical History:  Procedure Laterality Date   ANGIOPLASTY     BIOPSY  10/21/2020   Procedure: BIOPSY;  Surgeon: Carol Ada, MD;  Location: [facility];  Service: Endoscopy;;   BREAST LUMPECTOMY     x2   ESOPHAGOGASTRODUODENOSCOPY Left 10/21/2020   Procedure: ESOPHAGOGASTRODUODENOSCOPY (EGD);  Surgeon: Carol Ada, MD;  Location: [facility];  Service: Endoscopy;  Laterality: Left;  Abnormal barium swallow   FOOT TENDON SURGERY     GALLBLADDER SURGERY     INCISIONAL HERNIA REPAIR N/A 03/01/2019   Procedure: OPEN INCISIONAL HERNIA REPAIR;  Surgeon: Armandina Gemma, MD;  Location: [facility];  Service: General;  Laterality: N/A;   INSERTION OF MESH N/A 03/01/2019   Procedure: INSERTION OF MESH;  Surgeon: Armandina Gemma, MD;  Location: [facility];  Service: General;  Laterality: N/A;   INTRAOPERATIVE TRANSTHORACIC ECHOCARDIOGRAM N/A 08/02/2017   Procedure: INTRAOPERATIVE TRANSTHORACIC ECHOCARDIOGRAM;  Surgeon: Burnell Blanks, MD;  Location: [facility];  Service: Open Heart Surgery;  Laterality: N/A;   REPLACEMENT TOTAL KNEE BILATERAL     RIGHT/LEFT HEART CATH AND CORONARY ANGIOGRAPHY N/A 07/05/2017   Procedure: RIGHT/LEFT HEART CATH AND CORONARY ANGIOGRAPHY;  Surgeon: Belva Crome, MD;  Location: [facility];  Service: Cardiovascular;  Laterality: N/A;   SKIN CANCER EXCISION  2018   right nostril    TOTAL ABDOMINAL HYSTERECTOMY     TRANSCATHETER AORTIC VALVE REPLACEMENT, TRANSFEMORAL N/A 08/02/2017    Procedure: TRANSCATHETER AORTIC VALVE REPLACEMENT, TRANSFEMORAL;  Surgeon: Burnell Blanks, MD;  Location: [place];  Service: Open Heart Surgery;  Laterality: N/A;    Social History:   reports that she quit smoking about 58 years ago. Her smoking use included cigarettes. She has a 10.00 pack-year smoking history. She has never used smokeless tobacco. She reports current alcohol use. She reports that she does not use drugs.   Family History:  Her family history includes Breast cancer in her sister; Heart  disease in her father and maternal grandfather; Stroke in her mother.   Allergies: Allergies  Allergen Reactions   Latex Hives and Swelling    Other reaction(s): Unknown   Lisinopril Cough    Other reaction(s): Unknown   Povidone Iodine Other (See Comments)    Blisters  Other reaction(s): Unknown   Povidone-Iodine     Other Reaction(s): Unknown    Home Medications: Prior to Admission medications   Medication Sig Start Date End Date Taking? Authorizing Provider  albuterol (VENTOLIN HFA) 108 (90 Base) MCG/ACT inhaler Inhale 2 puffs into the lungs every 4 (four) hours as needed for wheezing or shortness of breath. 04/09/22   Nona Dell, Corene Cornea, MD  amLODipine (NORVASC) 2.5 MG tablet Take 2.5 mg by mouth 2 (two) times daily. 07/14/21   [provider]  Ascorbic Acid (VITAMIN C) 1000 MG tablet Take 1,000 mg by mouth daily.    [provider]  atorvastatin (LIPITOR) 10 MG tablet Take 10 mg by mouth daily. 01/16/20   [provider]  benzonatate (TESSALON) 200 MG capsule Take 400 mg by mouth every 8 (eight) hours as needed for cough. 02/12/22   [provider]  Budesonide ER 9 MG TB24 Take 1 tablet by mouth every morning. 01/05/22   [provider]  buPROPion (WELLBUTRIN XL) 300 MG 24 hr tablet Take 300 mg by mouth every morning. 07/29/21   [provider]  busPIRone (BUSPAR) 5 MG tablet Take 1 tablet (5 mg total) by mouth 3 (three) times  daily as needed (anxiety). 03/09/22   Townsend Roger, MD  cholecalciferol (VITAMIN D3) 25 MCG (1000 UT) tablet Take 1,000 Units by mouth daily. Patient not taking: Reported on 05/13/2021    [provider]  cloNIDine (CATAPRES) 0.1 MG tablet Take 0.1 mg by mouth 2 (two) times daily. 03/18/20   [provider]  diclofenac (VOLTAREN) 25 MG EC tablet Take 1 tablet (25 mg total) by mouth 2 (two) times daily as needed for moderate pain. 03/09/22   Townsend Roger, MD  diphenoxylate-atropine (LOMOTIL) 2.5-0.025 MG tablet Take 1 tablet by mouth 4 (four) times daily as needed for diarrhea or loose stools. 10/21/20   Barb Merino, MD  doxycycline (MONODOX) 100 MG capsule Take 1 capsule (100 mg total) by mouth 2 (two) times daily. 02/26/22   Townsend Roger, MD  Eszopiclone 3 MG TABS Take 3 mg by mouth at bedtime. 02/16/22   [provider]  FEROSUL 325 (65 Fe) MG tablet Take 325 mg by mouth 2 (two) times daily. 03/03/22   [provider]  fluticasone (FLONASE) 50 MCG/ACT nasal spray Place 1 spray into both nostrils in the morning and at bedtime. 02/26/22 02/26/23  Townsend Roger, MD  furosemide (LASIX) 20 MG tablet Take 1 tablet (20 mg total) by mouth 2 (two) times daily. 04/09/22   Townsend Roger, MD  levothyroxine (SYNTHROID, LEVOTHROID) 50 MCG tablet Take 50 mcg by mouth daily before breakfast.  12/09/16   [provider]  Magnesium Oxide 500 MG CAPS Take 500 mg by mouth daily. 10/21/20   [provider]  metoprolol succinate (TOPROL-XL) 25 MG 24 hr tablet Take 25 mg by mouth 2 (two) times daily. 03/03/22   [provider]  OVER THE COUNTER MEDICATION Take 2 tablets by mouth daily. Bladder Control 360    [provider]  Probiotic Product (ALIGN PO) Take 1 capsule by mouth daily. Patient not taking: Reported on 05/13/2021  [provider]    Critical care time: 35 minutes   The patient is critically ill with multiple organ system  failure and requires high complexity decision making for assessment and support, frequent evaluation and titration of therapies, advanced monitoring, review of radiographic studies and interpretation of complex data.   Critical Care Time devoted to patient care services, exclusive of separately billable procedures, described in this note is 35 minutes.  Lestine Mount, PA-C [facility] 04/16/22 5:58 PM  Please see [email or web address] for pager details.  From 7A-7P if no response, please call [phone] After hours, please call ELink [phone]

## 2022-04-16 NOTE — ED Triage Notes (Signed)
Pt BIB [facility] from [facility]. Caregiver call pt c/o shortness of breath and chest pain. Caregiver reported to EMS pt was find going to the bath room. Pt began having left side weakness and gobble speech and facial droop for 10 to 40 minutes, eventually pt was fine for 5 to 10 minutes. EMS reported pt could grip with left hand and talk clear.

## 2022-04-16 NOTE — Sedation Documentation (Signed)
Manual pressure released at groin site with Dr. Estanislado Pandy present. Site is clean, dry and intact. No hematoma noted. No further drainage noted. Quik clot in place, discontinue in 24 hours 2/3 at 1720. Doppler pulses intact.

## 2022-04-16 NOTE — Sedation Documentation (Signed)
NIHSS-28 Doppler pulses intact bilaterally DP/PT. Extremities cool to touch at baseline

## 2022-04-16 NOTE — Procedures (Signed)
INR.  Status post right common carotid arteriogram. Right CFA approach. Findings.  Occluded distal right M1 segment. Status post complex revascularization of occluded right middle cerebral artery distal M1 segment with 1 pass with contact aspiration and proximal flow arrest achieving aTICI 3 revascularization. Post CT of the brain.NO ICH Manual  compression with quick clot held for 1 hr and 10 minutes for hemostasis of the right groin puncture site due to failure of 8 French Angio-Seal closure device. Distal pulses.All dopplerable  Patient left intubated due to COVID-19 positivity.[name]  S.Lathaniel Legate MD

## 2022-04-16 NOTE — Sedation Documentation (Signed)
Holding manual pressure, no hematoma at this time. Unable to assess distal pulses

## 2022-04-16 NOTE — Code Documentation (Signed)
Stroke Response Nurse Documentation Code Documentation  [name] is a 89 y.o. female arriving to [facility]  via [facility] on 04/16/2022 with past medical hx of Breast Cancer, HTN, HLD, . Code stroke was activated by EMS.   Patient from home where she was LKW at 1500 with her caregiver. Pt lives by herself but has a caregiver that comes in the day to help her. She was at her baseline this morning and went to the bathroom. After she went to the bathroom, the caregiver noted that she was weak on the left sided and she was not responding normally. EMS called and activated a Code Stroke. While in route she started to resolve. Prior to arrival, pt worsened back to left sided weakness and neglect.   Stroke team at the bedside on patient arrival. Labs drawn and patient cleared for CT by Margarita Mail, PA. Patient to CT with team. NIHSS 18, see documentation for details and code stroke times. Patient with right gaze preference , left hemianopia, left facial droop, left arm weakness, left leg weakness, left decreased sensation, dysarthria , and Visual  neglect on exam. The following imaging was completed:  CT Head and CTA. Patient is a candidate for IV Thrombolytic due to see MAR. Patient is a candidate for IR and transferred immediately.   Care Plan: IR and Admission to Neuro ICU.   Bedside handoff with IR RN Burman Nieves.    Kathrin Greathouse  Stroke Response RN

## 2022-04-16 NOTE — Anesthesia Preprocedure Evaluation (Addendum)
Anesthesia Evaluation  Patient identified by MRN, date of birth, ID band Patient awake and Patient confused    Reviewed: Allergy & Precautions, Patient's Chart, lab work & pertinent test results, Unable to perform ROS - Chart review onlyPreop documentation limited or incomplete due to emergent nature of procedure.  History of Anesthesia Complications Negative for: history of anesthetic complications  Airway Mallampati: II  TM Distance: >3 FB Neck ROM: Full    Dental  (+) Missing, Dental Advisory Given   Pulmonary COPD,  COPD inhaler, former smoker   breath sounds clear to auscultation       Cardiovascular hypertension, Pt. on medications and Pt. on home beta blockers + CAD, + Past MI, + Cardiac Stents and +CHF  + dysrhythmias Atrial Fibrillation + Valvular Problems/Murmurs (now s/p TAVR)  Rhythm:Irregular Rate:Normal  '20 ECHO:  1. EF 50-55%. The cavity size was normal. There is moderate  asymmetric left ventricular hypertrophy. Left ventricular diastolic Doppler parameters are consistent with pseudonormalization. Elevated left ventricular end-diastolic pressure.   2. The right ventricle has normal systolic function. The cavity was normal. There is no increase in right ventricular wall thickness.   3. Mild thickening of the mitral valve leaflet. Mild calcification of the mitral valve leaflet. There is moderate mitral annular calcification present.   4. The aorta is normal unless otherwise noted.   5. - TAVR: Post TAVR with 26 mm Sapien 3 no PVL and similar gradients to June 2019 mean 8 to 9 mmHg peak 14 to 18 mmHg.     Neuro/Psych   Anxiety Depression    Acute CVA: left side weakness and gobble speech and facial droop for 10 to 40 minutes, eventually pt was fine for 5 to 10 minutes CVA (acute)    GI/Hepatic ,GERD  ,,  Endo/Other  Hypothyroidism    Renal/GU Renal InsufficiencyRenal disease     Musculoskeletal   Abdominal    Peds  Hematology   Anesthesia Other Findings H/o breast cancer  Reproductive/Obstetrics                              Anesthesia Physical Anesthesia Plan  ASA: 4 and emergent  Anesthesia Plan: General   Post-op Pain Management: Minimal or no pain anticipated   Induction: Intravenous  PONV Risk Score and Plan: 3 and Ondansetron, Dexamethasone and Treatment may vary due to age or medical condition  Airway Management Planned: Oral ETT  Additional Equipment: Arterial line  Intra-op Plan:   Post-operative Plan: Possible Post-op intubation/ventilation  Informed Consent:      Only emergency history available and History available from chart only  Plan Discussed with: CRNA and Surgeon  Anesthesia Plan Comments:          Anesthesia Quick Evaluation

## 2022-04-16 NOTE — Sedation Documentation (Addendum)
Patient transported to [facility] ICU with CRNA. ICU staff at the bedside to receive patient. Groin site assessed. Clean, dry and intact. No drainage noted. Soft to palpation, no hematoma noted. Doppler distal pulses intact. Knee immobilizer in place per Dr. Estanislado Pandy

## 2022-04-16 NOTE — Sedation Documentation (Signed)
NIHSS 28- q15 min assessments

## 2022-04-16 NOTE — Transfer of Care (Signed)
Immediate Anesthesia Transfer of Care Note  Patient: [name]  Procedure(s) Performed: IR WITH ANESTHESIA  Patient Location: ICU  Anesthesia Type:General  Level of Consciousness: Patient remains intubated per anesthesia plan  Airway & Oxygen Therapy: Patient remains intubated per anesthesia plan and Patient placed on Ventilator (see vital sign flow sheet for setting)  Post-op Assessment: Report given to RN and Post -op Vital signs reviewed and stable  Post vital signs: Reviewed and stable  Last Vitals:  Vitals Value Taken Time  BP 121/55 04/16/22 1905  Temp    Pulse 85 04/16/22 1910  Resp 18 04/16/22 1910  SpO2 100 % 04/16/22 1910  Vitals shown include unvalidated device data.  Last Pain:  Vitals:   04/16/22 1621  PainSc: 0-No pain         Complications: No notable events documented.

## 2022-04-16 NOTE — Sedation Documentation (Signed)
8 Fr angioseal did not deploy right groin. Holding manual pressure at this time, 30 min

## 2022-04-16 NOTE — ED Provider Notes (Signed)
[name]   CSN: [id number] Arrival date & time: 04/16/22  1603  An emergency department physician performed an initial assessment on this suspected stroke patient at 72.  History  Chief Complaint  Patient presents with   Code Stroke    [name] is a 89 y.o. female.  [name] is a 89 year old female past history of hypertension and recent diagnosis of atrial fibrillation with deferment of blood thinners given age presents to the emergency [place] after acute mental status change today patient's last known well was 1500 today patient gone to the bathroom complained of chest pain EMS was therefore called upon arrival patient developed slurred speech, Left-sided weakness, right-sided gaze deviation and left-sided neglect.  Patient presents to the emergency department as a code stroke with the same symptoms.         Home Medications Prior to Admission medications   Medication Sig Start Date End Date Taking? Authorizing Provider  albuterol (VENTOLIN HFA) 108 (90 Base) MCG/ACT inhaler Inhale 2 puffs into the lungs every 4 (four) hours as needed for wheezing or shortness of breath. 04/09/22   Nona Dell, Corene Cornea, MD  amLODipine (NORVASC) 2.5 MG tablet Take 2.5 mg by mouth 2 (two) times daily. 07/14/21   [provider]  Ascorbic Acid (VITAMIN C) 1000 MG tablet Take 1,000 mg by mouth daily.    [provider]  atorvastatin (LIPITOR) 10 MG tablet Take 10 mg by mouth daily. 01/16/20   [provider]  benzonatate (TESSALON) 200 MG capsule Take 400 mg by mouth every 8 (eight) hours as needed for cough. 02/12/22   [provider]  Budesonide ER 9 MG TB24 Take 1 tablet by mouth every morning. 01/05/22   [provider]  buPROPion (WELLBUTRIN XL) 300 MG 24 hr tablet Take 300 mg by mouth every morning. 07/29/21   [provider]  busPIRone (BUSPAR) 5 MG tablet  Take 1 tablet (5 mg total) by mouth 3 (three) times daily as needed (anxiety). 03/09/22   Townsend Roger, MD  cholecalciferol (VITAMIN D3) 25 MCG (1000 UT) tablet Take 1,000 Units by mouth daily. Patient not taking: Reported on 05/13/2021    [provider]  cloNIDine (CATAPRES) 0.1 MG tablet Take 0.1 mg by mouth 2 (two) times daily. 03/18/20   [provider]  diclofenac (VOLTAREN) 25 MG EC tablet Take 1 tablet (25 mg total) by mouth 2 (two) times daily as needed for moderate pain. 03/09/22   Townsend Roger, MD  diphenoxylate-atropine (LOMOTIL) 2.5-0.025 MG tablet Take 1 tablet by mouth 4 (four) times daily as needed for diarrhea or loose stools. 10/21/20   Barb Merino, MD  doxycycline (MONODOX) 100 MG capsule Take 1 capsule (100 mg total) by mouth 2 (two) times daily. 02/26/22   Townsend Roger, MD  Eszopiclone 3 MG TABS Take 3 mg by mouth at bedtime. 02/16/22   [provider]  FEROSUL 325 (65 Fe) MG tablet Take 325 mg by mouth 2 (two) times daily. 03/03/22   [provider]  fluticasone (FLONASE) 50 MCG/ACT nasal spray Place 1 spray into both nostrils in the morning and at bedtime. 02/26/22 02/26/23  Townsend Roger, MD  furosemide (LASIX) 20 MG tablet Take 1 tablet (20 mg total) by mouth 2 (two) times daily. 04/09/22   Townsend Roger, MD  levothyroxine (SYNTHROID, LEVOTHROID) 50 MCG tablet Take 50 mcg by mouth daily before breakfast.  12/09/16   [provider]  Magnesium Oxide 500 MG CAPS Take 500 mg by mouth daily. 10/21/20   [provider]  metoprolol succinate (TOPROL-XL) 25 MG 24 hr tablet Take 25 mg by mouth 2 (two) times daily. 03/03/22   [provider]  OVER THE COUNTER MEDICATION Take 2 tablets by mouth daily. Bladder Control 360    [provider]  Probiotic Product (ALIGN PO) Take 1 capsule by mouth daily. Patient not taking: Reported on 05/13/2021    [provider]      Allergies    Latex, Lisinopril,  Povidone iodine, and Povidone-iodine    Review of Systems   Review of Systems  Physical Exam Updated Vital Signs BP 110/62   Pulse 86   Temp (!) 96.7 F (35.9 C) (Axillary) Comment: Rachel notified  Resp 18   Ht '4\' 11"'$  (1.499 m)   Wt 64.6 kg   SpO2 97%   BMI 28.76 kg/m  Physical Exam Please see neurology [name] for full documentation of physical exam. ED Results / Procedures / Treatments   Labs (all labs ordered are listed, but only abnormal results are displayed) Labs Reviewed  SARS CORONAVIRUS 2 BY RT PCR - Abnormal; Notable for the following components:      Result Value   SARS Coronavirus 2 by RT PCR POSITIVE (*)    All other components within normal limits  CBC - Abnormal; Notable for the following components:   Hemoglobin 11.1 (*)    HCT 35.9 (*)    RDW 22.2 (*)    All other components within normal limits  COMPREHENSIVE METABOLIC PANEL - Abnormal; Notable for the following components:   Potassium 3.3 (*)    Glucose, Bld 176 (*)    Creatinine, Ser 1.61 (*)    Total Protein 6.2 (*)    Albumin 3.4 (*)    GFR, Estimated 29 (*)    All other components within normal limits  HEMOGLOBIN AND HEMATOCRIT, BLOOD - Abnormal; Notable for the following components:   Hemoglobin 10.1 (*)    HCT 31.6 (*)    All other components within normal limits  CBG MONITORING, ED - Abnormal; Notable for the following components:   Glucose-Capillary 168 (*)    All other components within normal limits  I-STAT CHEM 8, ED - Abnormal; Notable for the following components:   Potassium 3.3 (*)    Creatinine, Ser 1.60 (*)    Glucose, Bld 178 (*)    Calcium, Ion 1.11 (*)    All other components within normal limits  POCT I-STAT 7, (LYTES, BLD GAS, ICA,H+H) - Abnormal; Notable for the following components:   pO2, Arterial 135 (*)    HCT 35.0 (*)    Hemoglobin 11.9 (*)    All other components within normal limits  MRSA NEXT GEN BY PCR, NASAL  ETHANOL  PROTIME-INR  APTT  DIFFERENTIAL   MAGNESIUM  RAPID URINE DRUG SCREEN, HOSP PERFORMED  URINALYSIS, ROUTINE W REFLEX MICROSCOPIC  LIPID PANEL  HEMOGLOBIN A1C  PATHOLOGIST SMEAR REVIEW  BLOOD GAS, ARTERIAL  TRIGLYCERIDES  CBC WITH DIFFERENTIAL/PLATELET  BASIC METABOLIC PANEL  I-STAT CHEM 8, ED    EKG None  Radiology DG CHEST PORT 1 VIEW  Result Date: 04/16/2022 CLINICAL DATA:  Endotracheal tube EXAM: PORTABLE CHEST 1 VIEW COMPARISON:  03/02/2022 FINDINGS: Endotracheal tube with tip measuring 2.3 cm above the carina. Shallow inspiration. Mild cardiac enlargement. Cardiac valve prosthesis. Lungs are clear. No pleural effusions. No pneumothorax. Mediastinal contours appear  intact. Calcified and tortuous aorta. Degenerative changes in the spine and shoulders. IMPRESSION: Endotracheal tube appears in satisfactory position. Cardiac enlargement. Lungs are clear. Electronically Signed   By: Lucienne Capers M.D.   On: 04/16/2022 19:57   CT ANGIO HEAD NECK W WO CM (CODE STROKE)  Result Date: 04/16/2022 CLINICAL DATA:  Stroke suspected EXAM: CT ANGIOGRAPHY HEAD AND NECK TECHNIQUE: Multidetector CT imaging of the head and neck was performed using the standard protocol during bolus administration of intravenous contrast. Multiplanar CT image reconstructions and MIPs were obtained to evaluate the vascular anatomy. Carotid stenosis measurements (when applicable) are obtained utilizing NASCET criteria, using the distal internal carotid diameter as the denominator. RADIATION DOSE REDUCTION: This exam was performed according to the departmental dose-optimization program which includes automated exposure control, adjustment of the mA and/or kV according to patient size and/or use of iterative reconstruction technique. CONTRAST:  75 mL Omnipaque 350 COMPARISON:  CT head 06/24/2021 FINDINGS: CT HEAD FINDINGS Brain: No evidence of acute infarction, hemorrhage, mass, mass effect, or midline shift. No hydrocephalus or extra-axial collection.  Redemonstrated hypodensity in the right frontal lobe, involving the right operculum, consistent with remote infarct. Lacunar infarct in right basal ganglia. Periventricular white matter changes, likely the sequela of chronic small vessel ischemic disease. Age-related cerebral volume loss. No Vascular: Possible hyperdense right MCA. No other hyperdense vessel. Atherosclerotic calcifications in the intracranial carotid and vertebral arteries. Skull: Negative for fracture or focal lesion. Sinuses/Orbits: Chronic opacification of the left sphenoid sinus. Minimal mucosal thickening in the right frontal sinus. Otherwise clear paranasal sinuses. Status post bilateral lens replacements. No acute finding in the orbits. Other: The mastoid air cells are well aerated. ASPECTS [facility] Stroke Program Early CT Score) - Ganglionic level infarction (caudate, lentiform nuclei, internal capsule, insula, M1-M3 cortex): 7 - Supraganglionic infarction (M4-M6 cortex): 3 Total score (0-10 with 10 being normal): 10 CTA NECK FINDINGS Aortic arch: Standard branching. Imaged portion shows no evidence of aneurysm or dissection. Approximately 70% stenosis in the proximal left subclavian artery (series 9, image 262). No other significant stenosis of the major arch vessel origins. 80% stenosis in the right subclavian artery, after the carotid takeoff (series 14, image 99). Aortic atherosclerosis. Right carotid system: No evidence of dissection, occlusion, or hemodynamically significant stenosis (greater than 50%). Atherosclerotic disease at the bifurcation and in the proximal ICA is not hemodynamically significant. Left carotid system: No evidence of dissection, occlusion, or hemodynamically significant stenosis (greater than 50%). Atherosclerotic disease at the bifurcation and in the proximal ICA is not hemodynamically significant. Vertebral arteries: No evidence of dissection, occlusion, or hemodynamically significant stenosis (greater than  50%). Skeleton: No acute osseous abnormality. Degenerative changes in the cervical spine. Other neck: Multiple hypoenhancing nodules in the right greater than left thyroid lobe, the largest of which measures up to 1.5 cm. The thyroid was most recently evaluated with ultrasound in 2019. Upper chest: Emphysematous changes. No focal pulmonary opacity or pleural effusion. Review of the MIP images confirms the above findings CTA HEAD FINDINGS Evaluation is somewhat limited by bolus timing. Anterior circulation: Both internal carotid arteries are patent to the termini, without significant stenosis. A1 segments patent, hypoplastic or stenotic on the right. Suspect an azygous A2 versus occlusion the right A2. Mild stenosis in the distal left ACA (series 9, image 64). Anterior cerebral arteries otherwise are patent to their distal aspects. Occlusion of the distal right M1 (series 9, image 93), without evidence of distal reconstitution. No left M1 stenosis or occlusion. Left MCA  branches are perfused to their distal aspects without significant stenosis. Posterior circulation: Vertebral arteries patent to the vertebrobasilar junction, with severe stenosis in the mid left V4 (series 9, image 139 and moderate stenosis in the mid to distal right V4 (series 9, image 140). Posterior inferior cerebellar arteries patent proximally. Basilar patent to its distal aspect. Superior cerebellar arteries patent proximally. Patent left P1. Fetal origin of the right PCA from the right posterior communicating artery. The left posterior communicating artery is also patent. PCAs perfused to their distal aspects without stenosis. Venous sinuses: As permitted by contrast timing, patent. Anatomic variants: Fetal origin of the right PCA. Review of the MIP images confirms the above findings IMPRESSION: 1. Occlusion of the distal right M1. 2. 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 3. Severe stenosis in the mid left V4, moderate stenosis in the  mid to distal right V4, and mild stenosis in the distal left ACA. 4. 70% stenosis in the proximal left subclavian artery and 80% stenosis in the proximal right subclavian artery. No other hemodynamically significant stenosis in the neck. 5. No acute intracranial process. 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 ASPECTS is 10. 6. Aortic atherosclerosis and emphysema. 7. Multiple hypoenhancing nodules in the right greater than left thyroid lobe, the largest of which measures up to 1.5 cm. The thyroid was most recently evaluated by ultrasound in 2019. If further evaluation is clinically indicated, taking into account the patient's age and clinical status, a nonemergent thyroid ultrasound could be obtained. Aortic Atherosclerosis (ICD10-I70.0) and Emphysema (ICD10-J43.9). Impression #1 was discussed by telephone on 04/16/2022 at 4:18 pm with provider [facility] . Impression #2 was communicated on 04/16/2022 at 4:43 pm to provider Dr. Lorrin Goodell via secure text paging. Electronically Signed   By: Merilyn Baba M.D.   On: 04/16/2022 16:45    Procedures Procedures    Medications Ordered in ED Medications  tirofiban (AGGRASTAT) 5-0.9 MG/100ML-% injection (has no administration in time range)  aspirin 81 MG chewable tablet (has no administration in time range)  verapamil (ISOPTIN) 2.5 MG/ML injection (has no administration in time range)  ticagrelor (BRILINTA) 90 MG tablet (has no administration in time range)  clopidogrel (PLAVIX) 300 MG tablet (has no administration in time range)  cangrelor (KENGREAL) 50 MG SOLR (has no administration in time range)  eptifibatide (INTEGRILIN) 20 MG/10ML injection (has no administration in time range)  nitroGLYCERIN 100 mcg/mL intra-arterial injection (has no administration in time range)   stroke: early stages of recovery book ( Does not apply MAR Unhold 04/16/22 1854)  0.9 %  sodium chloride infusion ( Intravenous New Bag/Given 04/16/22 1927)  pantoprazole (PROTONIX)  injection 40 mg (40 mg Intravenous Given 04/16/22 2228)  acetaminophen (TYLENOL) tablet 650 mg ( Oral MAR Unhold 04/16/22 1854)    Or  acetaminophen (TYLENOL) 160 MG/5ML solution 650 mg ( Per Tube MAR Unhold 04/16/22 1854)    Or  acetaminophen (TYLENOL) suppository 650 mg ( Rectal MAR Unhold 04/16/22 1854)  acetaminophen (TYLENOL) tablet 650 mg (has no administration in time range)    Or  acetaminophen (TYLENOL) 160 MG/5ML solution 650 mg (has no administration in time range)    Or  acetaminophen (TYLENOL) suppository 650 mg (has no administration in time range)  0.9 %  sodium chloride infusion ( Intravenous Duplicate 09/14/20 0254)  potassium chloride 10 mEq in 100 mL IVPB (10 mEq Intravenous New Bag/Given 04/17/22 0031)  clevidipine (CLEVIPREX) infusion 0.5 mg/mL (2 mg/hr Intravenous New Bag/Given 04/16/22 2301)  Oral  care mouth rinse (15 mLs Mouth Rinse Given 04/17/22 0032)  Oral care mouth rinse (has no administration in time range)  docusate (COLACE) 50 MG/5ML liquid 100 mg (100 mg Per Tube Not Given 04/16/22 2227)  polyethylene glycol (MIRALAX / GLYCOLAX) packet 17 g (17 g Per Tube Not Given 04/16/22 2228)  fentaNYL (SUBLIMAZE) injection 25 mcg (has no administration in time range)  fentaNYL (SUBLIMAZE) injection 25-100 mcg (has no administration in time range)  propofol (DIPRIVAN) 1000 MG/100ML infusion (38 mcg/kg/min  64.6 kg Intravenous New Bag/Given 04/16/22 2210)  tenecteplase (TNKASE) injection for Stroke 16 mg (16 mg Intravenous Given 04/16/22 1618)  iohexol (OMNIPAQUE) 350 MG/ML injection 75 mL (75 mLs Intravenous Contrast Given 04/16/22 1617)  fentaNYL (SUBLIMAZE) 100 MCG/2ML injection (  Override pull for Anesthesia 04/16/22 1647)  iohexol (OMNIPAQUE) 300 MG/ML solution 150 mL (75 mLs Intra-arterial Contrast Given 04/16/22 1730)    ED Course/ Medical Decision Making/ A&P                             Medical Decision Making Naarah Borgerding Weinel 89 year old female with past medical history as  documented above presenting to the emergency department as a code stroke with the above-mentioned neurological symptoms.  Initial assessment patient is GCS 13 she is confused but awake and alert.  No necessity for emergent airway intervention. Patient NIHSS on arrival 13.   Patient was taken to CT scanner stat.  She had an occlusion in the distal right M1.  TNK pushed at 1618.  Patient brushed up to IR suite for emergent thrombectomy.   Problems Addressed: Cerebrovascular accident (CVA) due to occlusion of right middle cerebral artery [facility]): acute illness or injury  Amount and/or Complexity of Data Reviewed Labs: ordered.  Risk Decision regarding hospitalization.           Final Clinical Impression(s) / ED Diagnoses Final diagnoses:  Other specified transient cerebral ischemias  Cerebrovascular accident (CVA) due to occlusion of right middle cerebral artery [facility]    Rx / DC Orders ED Discharge Orders     None         Donzetta Matters, MD 04/17/22 0973    Lajean Saver, MD 04/17/22 1445

## 2022-04-16 NOTE — Anesthesia Postprocedure Evaluation (Signed)
Anesthesia Post Note  Patient: [name]  Procedure(s) Performed: IR WITH ANESTHESIA     Patient location during evaluation: SICU Anesthesia Type: General Level of consciousness: sedated Pain management: pain level controlled Vital Signs Assessment: post-procedure vital signs reviewed and stable Respiratory status: patient remains intubated per anesthesia plan Cardiovascular status: stable Postop Assessment: no apparent nausea or vomiting Anesthetic complications: no  No notable events documented.  Last Vitals:  Vitals:   04/16/22 1855 04/16/22 2033  BP:    Pulse:  82  SpO2: 100%     Last Pain:  Vitals:   04/16/22 1621  PainSc: 0-No pain                 Effie Berkshire

## 2022-04-16 NOTE — Anesthesia Procedure Notes (Signed)
Procedure Name: Intubation Date/Time: 04/16/2022 4:33 PM  Performed by: Lance Coon, CRNAPre-anesthesia Checklist: Patient identified, Emergency Drugs available, Suction available, Patient being monitored and Timeout performed Patient Re-evaluated:Patient Re-evaluated prior to induction Oxygen Delivery Method: Circle system utilized Preoxygenation: Pre-oxygenation with 100% oxygen Induction Type: IV induction, Rapid sequence and Cricoid Pressure applied Laryngoscope Size: Miller and 2 Grade View: Grade I Tube type: Oral Number of attempts: 1 Airway Equipment and Method: Stylet Placement Confirmation: ETT inserted through vocal cords under direct vision, positive ETCO2 and breath sounds checked- equal and bilateral Secured at: 21 cm Tube secured with: Tape Dental Injury: Teeth and Oropharynx as per pre-operative assessment

## 2022-04-16 NOTE — Progress Notes (Signed)
PHARMACIST CODE STROKE RESPONSE  Notified to mix TNK at 1611 by Dr. Lorrin Goodell TNK preparation completed at 1612  TNK dose = 16 mg IV over 5 seconds  Issues/delays encountered (if applicable): BP control, required labetalol   Eliseo Gum, PharmD PGY1 Pharmacy Resident   04/16/2022  4:26 PM

## 2022-04-16 NOTE — H&P (Signed)
Neurology H&P  CC: Left-sided weakness, left-sided neglect, right gaze deviation, dysarthria and chest pain  History is obtained from: Patient, EMS and chart  HPI: [name] is a 89 y.o. female with history of atrial fibrillation not on anticoagulation, aortic stenosis status post TAVR, CAD, CKD stage III, depression, breast cancer, hyperlipidemia, hypertension, hypothyroidism and arthritis who was last seen well by her caregiver at 1500.  Patient had gone to the bathroom and then began to complain of chest pain.  EMS was called, and when EMS arrived, patient developed dysarthria, left-sided weakness, right gaze deviation and left-sided neglect.  Head CT in emergency department showed no acute abnormalities with aspects of 10.  On CTA, right M1 occlusion was noted.  Discussion was had with patient's son, and consent was obtained for TNK administration and mechanical thrombectomy.  Patient was made a DNR per her previously stated wishes as verbalized by her son.  TNK was administered at 1618 with a short delay due to blood pressure being out of range, but this resolved with administration of labetalol. Patient was started on Cleviprex as her blood pressure began to rise after TNK administration.   LKW: 1500 TNK given?:  Yes, at 1618 after discussion with patient's son, short delay due to blood pressure being out of range IR Thrombectomy?  Yes, patient taken to IR for thrombectomy of right M1 at 1630 Modified Rankin Scale: 3-Moderate disability-requires help but walks WITHOUT assistance  NIHSS:  1a Level of Conscious.: 0 1b LOC Questions: 0 1c LOC Commands: 0 2 Best Gaze: 2 3 Visual: 2 4 Facial Palsy: 2 5a Motor Arm - left: 2 5b Motor Arm - Right: 0 6a Motor Leg - Left: 4 6b Motor Leg - Right: 1 7 Limb Ataxia: 0 8 Sensory: 2 9 Best Language: 0 10 Dysarthria: 1 11 Extinct and Inattention.: 2 TOTAL: 18     ROS: Unable to obtain due to altered mental status.   Past  Medical History:  Diagnosis Date   Abnormal gait 06/24/2015   Last Assessment & Plan:   Relevant Hx:  Course:  Daily Update:  Today's Plan:uses her cane for this and no recent falls     Electronically signed by: Mayer Camel, NP  07/23/15 1529   Acute cough 08/25/2011   Acute kidney injury superimposed on chronic kidney disease ([place]) 10/18/2020   Anemia due to stage 3 chronic kidney disease ([place]) 06/24/2015   Last Assessment & Plan: Formatting of this note might be different from the original. Relevant Hx: Course: Daily Update: Today's Plan:update her CMP for her Electronically signed by: Mayer Camel, NP 07/23/15 1528 Formatting of this note might be different from the original. Last Assessment & Plan: Relevant Hx: Course: Daily Update: Today's Plan:update her CMP for her Electronically    Anxiety 06/24/2015   Last Assessment & Plan:   Relevant Hx:  Course:  Daily Update:  Today's Plan:this is stable for her at this time     Electronically signed by: Mayer Camel, NP  07/23/15 1529   Aortic stenosis, severe    a. 07/2017: s/p TAVR w/ an Edwards Sapien 3 THV (size 26 mm, model # [id number], serial # [id number])   Atherosclerosis of coronary artery 06/24/2015   Formatting of this note might be different from the original. Ostial RCA DES, 2010 Last Assessment & Plan: stable Last Assessment & Plan: Relevant Hx: Course: Daily Update: Today's Plan:she has felt she was stable from this and she has cardiologist  she has followed with . Electronically signed by: Mayer Camel, NP 07/23/15 1523 Ostial RCA DES, 2010 Last Assessment & Plan: stable   Atrial fibrillation ([place]) 03/19/2022   Bilateral hearing loss 06/25/2015   Bilateral sensorineural hearing loss 12/26/2020   Breast cancer, right breast ([place]) 03/24/2008   CAD (coronary artery disease)    a. 2010: s/p stent to RCA   Chronic diastolic (congestive) heart failure ([place]) 08/04/2017   Chronic kidney  disease, stage III (moderate) ([facility])    Chronic recurrent major depressive disorder ([place]) 06/24/2015   Last Assessment & Plan:   Relevant Hx:  Course:  Daily Update:  Today's Plan:this is stable for her     Electronically signed by: Mayer Camel, NP  07/23/15 1530   Chronic sinusitis    Coronary artery disease involving native coronary artery of native heart with angina pectoris ([place]) 06/24/2015   Formatting of this note might be different from the original. Overview: Ostial RCA DES, 2010 Last Assessment & Plan: stable Last Assessment & Plan: Formatting of this note might be different from the original. Relevant Hx: Course: Daily Update: Today's Plan:she has felt she was stable from this and she has cardiologist she has followed with . Electronically signed by: Mayer Camel, N   Coronary artery disease involving native coronary artery of native heart without angina pectoris    Ostial RCA DES, 2010   Decreased hearing of both ears 06/25/2015   Deficiency anemia 03/11/2021   Dehydration 10/19/2020   Depressive disorder    Dizziness 08/14/2018   [place] of macula of both eyes 09/12/2018   Ductal carcinoma in situ of breast 06/23/2007   Encounter for long-term current use of high risk medication 06/24/2015   Epiretinal membrane (ERM) of left eye 09/12/2018   Essential hypertension, benign 02/16/2013   Gastro-esophageal reflux disease with esophagitis 06/24/2015   Last Assessment & Plan:   Relevant Hx:  Course:  Daily Update:  Today's Plan:she is stable from this      Electronically signed by: Mayer Camel, NP  07/23/15 1526   Gastroesophageal reflux disease without esophagitis 12/27/2016   History of breast cancer    a. s/p lumpectomies and XRT   Hyperlipemia    Hyperlipidemia 02/16/2013   Hypertension    Hypothyroid    Hypothyroidism, acquired 06/24/2015   Last Assessment & Plan:   Relevant Hx:  Course:  Daily Update:  Today's Plan:update her TSH for  her today     Electronically signed by: Mayer Camel, NP  07/23/15 1526   Incisional hernia, without obstruction or gangrene 02/24/2019   Insomnia    Intermediate stage nonexudative age-related macular degeneration of both eyes 01/19/2021   Irritation of oral cavity 11/23/2019   Malaise and fatigue 06/24/2015   Last Assessment & Plan:   Relevant Hx:  Course:  Daily Update:  Today's Plan:she is up and down with her energy and she feels that for the most part she has been stable     Electronically signed by: Mayer Camel, NP  07/23/15 1533   Malignant neoplasm of central portion of left female breast ([place]) 06/24/2015   Last Assessment & Plan:   Relevant Hx:  Course:  Daily Update:  Today's Plan:she is now receiving injections once a month for this and is hopeful will keep this at bay as this has been recurrent     Electronically signed by: Mayer Camel, NP  07/23/15 1530   Melena 03/19/2022   Memory  change 07/23/2015   Last Assessment & Plan:   Relevant Hx:  Course:  Daily Update:  Today's Plan:she is going to take the aricept at supper     Electronically signed by: Mayer Camel, NP  07/23/15 1532   Memory loss    Multiple thyroid nodules 02/09/2012   Osteoarthritis    Osteoporosis 03/24/2020   Other allergic rhinitis 12/27/2016   Plantar fat pad atrophy 01/19/2016   Posterior vitreous detachment of left eye 09/12/2018   Primary insomnia 06/24/2015   Last Assessment & Plan:   Relevant Hx:  Course:  Daily Update:  Today's Plan:she feels that this is stable for her at this time     Electronically signed by: Mayer Camel, NP  07/23/15 1531   Primary localized osteoarthrosis, ankle and foot 01/15/2016   Primary osteoarthritis involving multiple joints 06/24/2015   Last Assessment & Plan:   Relevant Hx:  Course:  Daily Update:  Today's Plan:she is stable from her joint though she has discomfort if she overexerts.     Electronically  signed by: Mayer Camel, NP  07/23/15 1528   Reflux esophagitis    S/P TAVR (transcatheter aortic valve replacement) 08/02/2017   Severe aortic stenosis 02/16/2013   Physician Review  Conclusions: 1. Mild concentric left ventricular hypertrophy.  2. Left ventricular ejection fraction estimated by 2D at 60-65 percent.  3. There were no regional wall motion abnormalities.  4. Mild mitral annular calcification.  5. Trace mitral valve regurgitation.  6. Trivial tricuspid regurgitation.  7. Moderate increased thickness and calcification of the trileaflet aortic val   Stage III chronic kidney disease ([place]) 06/24/2015   Last Assessment & Plan:   Relevant Hx:  Course:  Daily Update:  Today's Plan:update her CMP for her     Electronically signed by: Mayer Camel, NP  07/23/15 1528   Thyroid nodule 06/24/2015   Vitreous membranes and strands 01/19/2021   PPV on 02/11/2021 OS     Family History  Problem Relation Age of Onset   Heart disease Father    Stroke Mother    Heart disease Maternal Grandfather    Breast cancer Sister      Social History:  reports that she quit smoking about 58 years ago. Her smoking use included cigarettes. She has a 10.00 pack-year smoking history. She has never used smokeless tobacco. She reports current alcohol use. She reports that she does not use drugs.   Prior to Admission medications   Medication Sig Start Date End Date Taking? Authorizing Provider  albuterol (VENTOLIN HFA) 108 (90 Base) MCG/ACT inhaler Inhale 2 puffs into the lungs every 4 (four) hours as needed for wheezing or shortness of breath. 04/09/22   Nona Dell, Corene Cornea, MD  amLODipine (NORVASC) 2.5 MG tablet Take 2.5 mg by mouth 2 (two) times daily. 07/14/21   [provider]  Ascorbic Acid (VITAMIN C) 1000 MG tablet Take 1,000 mg by mouth daily.    [provider]  atorvastatin (LIPITOR) 10 MG tablet Take 10 mg by mouth daily. 01/16/20   [provider]   benzonatate (TESSALON) 200 MG capsule Take 400 mg by mouth every 8 (eight) hours as needed for cough. 02/12/22   [provider]  Budesonide ER 9 MG TB24 Take 1 tablet by mouth every morning. 01/05/22   [provider]  buPROPion (WELLBUTRIN XL) 300 MG 24 hr tablet Take 300 mg by mouth every morning. 07/29/21   [provider]  busPIRone (BUSPAR)  5 MG tablet Take 1 tablet (5 mg total) by mouth 3 (three) times daily as needed (anxiety). 03/09/22   Townsend Roger, MD  cholecalciferol (VITAMIN D3) 25 MCG (1000 UT) tablet Take 1,000 Units by mouth daily. Patient not taking: Reported on 05/13/2021    [provider]  cloNIDine (CATAPRES) 0.1 MG tablet Take 0.1 mg by mouth 2 (two) times daily. 03/18/20   [provider]  diclofenac (VOLTAREN) 25 MG EC tablet Take 1 tablet (25 mg total) by mouth 2 (two) times daily as needed for moderate pain. 03/09/22   Townsend Roger, MD  diphenoxylate-atropine (LOMOTIL) 2.5-0.025 MG tablet Take 1 tablet by mouth 4 (four) times daily as needed for diarrhea or loose stools. 10/21/20   Barb Merino, MD  doxycycline (MONODOX) 100 MG capsule Take 1 capsule (100 mg total) by mouth 2 (two) times daily. 02/26/22   Townsend Roger, MD  Eszopiclone 3 MG TABS Take 3 mg by mouth at bedtime. 02/16/22   [provider]  FEROSUL 325 (65 Fe) MG tablet Take 325 mg by mouth 2 (two) times daily. 03/03/22   [provider]  fluticasone (FLONASE) 50 MCG/ACT nasal spray Place 1 spray into both nostrils in the morning and at bedtime. 02/26/22 02/26/23  Townsend Roger, MD  furosemide (LASIX) 20 MG tablet Take 1 tablet (20 mg total) by mouth 2 (two) times daily. 04/09/22   Townsend Roger, MD  levothyroxine (SYNTHROID, LEVOTHROID) 50 MCG tablet Take 50 mcg by mouth daily before breakfast.  12/09/16   [provider]  Magnesium Oxide 500 MG CAPS Take 500 mg by mouth daily. 10/21/20   [provider]  metoprolol succinate  (TOPROL-XL) 25 MG 24 hr tablet Take 25 mg by mouth 2 (two) times daily. 03/03/22   [provider]  OVER THE COUNTER MEDICATION Take 2 tablets by mouth daily. Bladder Control 360    [provider]  Probiotic Product (ALIGN PO) Take 1 capsule by mouth daily. Patient not taking: Reported on 05/13/2021    [provider]     Exam: Current vital signs: BP (!) 177/102 Comment: Clevi started at 2  Wt 64.6 kg   SpO2 99%   BMI 28.76 kg/m    Physical Exam  Constitutional: Appears well-developed and well-nourished.  Psych: Affect appropriate to situation Eyes: No scleral injection HENT: No OP obstrucion Head: Normocephalic.  Respiratory: Effort normal on supplemental O2 Skin: WDI  Neuro: Mental Status: Patient is awake, alert, oriented to person, place and time Patient is not able to give a clear and coherent history. Speech is dysarthric but not aphasic, patient is able to name objects.  Left-sided neglect was noted, with patient unable to recognize her left hand Cranial Nerves: II: Left-sided visual field deficit pupils are equal, round, and reactive to light.   III,IV, VI: Right gaze deviation V: Facial sensation is symmetric to temperature VII: Left-sided facial droop VIII: Hearing is intact to voice X: Voice is dysarthric XII: Tongue is midline without atrophy or fasciculations.  Motor: Tone is normal. Bulk is normal.  Normal antigravity strength present in right upper extremity and right lower extremity with some drift in right lower extremity, unable to move left upper and lower extremities to command Sensory: Sensory deficit in left arm and leg noted   I have reviewed labs in epic and the pertinent results are:    Latest Ref Rng & Units 04/16/2022    4:16 PM 04/16/2022  4:09 PM 09/07/2021   12:00 AM  CBC  WBC 4.0 - 10.5 K/uL 4.4   11.5      Hemoglobin 12.0 - 15.0 g/dL 11.1  12.6  9.4      Hematocrit 36.0 - 46.0 % 35.9  37.0  29      Platelets  150 - 400 K/uL 272   422         This result is from an external source.       Latest Ref Rng & Units 04/16/2022    4:16 PM 04/16/2022    4:09 PM 09/07/2021   12:00 AM  BMP  Glucose 70 - 99 mg/dL 176  178    BUN 8 - 23 mg/dL '15  17  18      '$ Creatinine 0.44 - 1.00 mg/dL 1.61  1.60  1.1      Sodium 135 - 145 mmol/L 137  138  136      Potassium 3.5 - 5.1 mmol/L 3.3  3.3  4.5      Chloride 98 - 111 mmol/L 102  102  103      CO2 22 - 32 mmol/L 23   25      Calcium 8.9 - 10.3 mg/dL 9.0   9.0         This result is from an external source.     I have reviewed the images obtained:  CT head: No acute abnormality, ASPECTS 10  CTA head and neck: LVO in right MCA M1, azygous A2 versus occlusion of right A2, severe stenosis in mid left V4, moderate stenosis in mid to distal right V4 and mild stenosis in left ACA  Impression: Acute ischemic stroke due to right MCA M1 occlusion in patient with atrial fibrillation not on anticoagulation  Recommendations: - Admit to ICU post interventional radiology and TNK -Keep systolic blood pressure [phone] for first 24 hours after mechanical thrombectomy, use Cleviprex if necessary -Head CT after 24 hours -Neurochecks every 15 minutes x 2 hours, every 30 minutes x 4 hours then every hour x 16 hours - MRI brain wo contrast - TTE w/ bubble - Check A1c and LDL + add statin per guidelines - antiplt/anticoag to be started 24 hours after TNK administration - STAT head CT for any change in neuro exam - Tele - PT/OT/SLP - Stroke education - Amb referral to neurology upon discharge     This patient is critically ill and at significant risk of neurological worsening, death and care requires constant monitoring of vital signs, hemodynamics,respiratory and cardiac monitoring, neurological assessment, discussion with family, other specialists and medical decision making of high complexity. I spent 45 minutes of neurocritical care time  in the care of  this patient.  This was time spent independent of any time provided by nurse practitioner or PA.  Assessment and plan discussed with with attending physician and they are in agreement.   [place] , MSN, AGACNP-BC [facility] See Amion for schedule and pager information 04/16/2022 4:39 PM   NEUROHOSPITALIST ADDENDUM Performed a face to face diagnostic evaluation.   I have reviewed the contents of history and physical exam as documented by PA/ARNP/Resident and agree with above documentation.  I have discussed and formulated the above plan as documented. Edits to the note have been made as needed.  Impression/Key exam findings/Plan: 64F with afibb not on AC p/w left sided weakness and R gaze deviation. EMS report that they were initially called for  chest pain and shortly before their arrival, she developed left sided weakness per patient and care giver. She was activated as a code stroke.  No recent heart attacks, no recent trauma, hx of breast cancer but no known brain mets. No hx of ICH, not on any anticoagulation per the med list that EMS got from the care giver.   I discussed risks, benefits mode of administration and alternatives to tnkase with her son and next of kin Ms. [employer] over phone. I discussed increased risk of hemorrhage given her advanced age. I personally reviewed CT Head which was negative for ICH. She was given tnkase. Some delay due to elevated blood pressure.  CT angio demonstrated R MCA M1 occlusion. Dr. Juliet Rude discussed risks and benefits of thrombectomy, details of procedure and alternatives. He specifically discussed complex aortic arch anatomy which may make it difficult to do thrombectomy. Mr. Anastyn Ayars and his wife were both on the phone and did not have any additional questions and consented to the surgery.   I verified code status and patient is DNR/DNI. Family making an exception for the surgery.  I also verified her allergies and her  allergies are uptodate in the chart.  This patient is critically ill and at significant risk of neurological worsening, death and care requires constant monitoring of vital signs, hemodynamics,respiratory and cardiac monitoring, neurological assessment, discussion with family, other specialists and medical decision making of high complexity. I spent 70 minutes of neurocritical care time  in the care of  this patient. This was time spent independent of any time provided by nurse practitioner or PA.  Donnetta Simpers [facility] Pager Number [phone] 04/16/2022  5:44 PM  Donnetta Simpers, MD [facility] [phone]   If 7pm to 7am, please call on call as listed on AMION.

## 2022-04-16 NOTE — Progress Notes (Signed)
Pt transported from IR to 4N24 on vent without complications.

## 2022-04-16 NOTE — ED Notes (Signed)
Pt went to IR after leaving CT

## 2022-04-16 NOTE — Sedation Documentation (Signed)
Modified NIHSS score for 1730 and 1745 assessment- 28 due to intubation- post intervention for code stroke

## 2022-04-16 NOTE — Anesthesia Procedure Notes (Signed)
Arterial Line Insertion Start/End2/04/2022 4:30 PM, 04/16/2022 4:33 PM Performed by: Josephine Igo, CRNA, CRNA  Patient location: OR. Preanesthetic checklist: patient identified, IV checked, site marked, risks and benefits discussed, surgical consent, monitors and equipment checked, pre-op evaluation, timeout performed and anesthesia consent Left, radial was placed Hand hygiene performed  and maximum sterile barriers used   Attempts: 1 Procedure performed without using ultrasound guided technique. Following insertion, dressing applied and Biopatch. Post procedure assessment: normal and unchanged  Patient tolerated the procedure well with no immediate complications.

## 2022-04-17 ENCOUNTER — Inpatient Hospital Stay (HOSPITAL_COMMUNITY): Payer: Medicare Other

## 2022-04-17 ENCOUNTER — Encounter (HOSPITAL_COMMUNITY): Payer: Self-pay | Admitting: Interventional Radiology

## 2022-04-17 ENCOUNTER — Other Ambulatory Visit (HOSPITAL_COMMUNITY): Payer: Medicare Other

## 2022-04-17 DIAGNOSIS — I639 Cerebral infarction, unspecified: Secondary | ICD-10-CM

## 2022-04-17 DIAGNOSIS — I6389 Other cerebral infarction: Secondary | ICD-10-CM | POA: Diagnosis not present

## 2022-04-17 DIAGNOSIS — Z8673 Personal history of transient ischemic attack (TIA), and cerebral infarction without residual deficits: Secondary | ICD-10-CM | POA: Diagnosis not present

## 2022-04-17 LAB — ECHOCARDIOGRAM COMPLETE
AR max vel: 1.4 cm2
AV Area VTI: 1.29 cm2
AV Area mean vel: 1.34 cm2
AV Mean grad: 3.3 mmHg
AV Peak grad: 6.1 mmHg
Ao pk vel: 1.23 m/s
Area-P 1/2: 3.1 cm2
Height: 59 in
S' Lateral: 2.1 cm
Weight: 2278.67 oz

## 2022-04-17 LAB — CBC WITH DIFFERENTIAL/PLATELET
Abs Immature Granulocytes: 0.06 10*3/uL (ref 0.00–0.07)
Basophils Absolute: 0 10*3/uL (ref 0.0–0.1)
Basophils Relative: 0 %
Eosinophils Absolute: 0 10*3/uL (ref 0.0–0.5)
Eosinophils Relative: 0 %
HCT: 33.9 % — ABNORMAL LOW (ref 36.0–46.0)
Hemoglobin: 11.1 g/dL — ABNORMAL LOW (ref 12.0–15.0)
Immature Granulocytes: 1 %
Lymphocytes Relative: 5 %
Lymphs Abs: 0.5 10*3/uL — ABNORMAL LOW (ref 0.7–4.0)
MCH: 27.9 pg (ref 26.0–34.0)
MCHC: 32.7 g/dL (ref 30.0–36.0)
MCV: 85.2 fL (ref 80.0–100.0)
Monocytes Absolute: 0.3 10*3/uL (ref 0.1–1.0)
Monocytes Relative: 3 %
Neutro Abs: 9.7 10*3/uL — ABNORMAL HIGH (ref 1.7–7.7)
Neutrophils Relative %: 91 %
Platelets: 307 10*3/uL (ref 150–400)
RBC: 3.98 MIL/uL (ref 3.87–5.11)
RDW: 21.9 % — ABNORMAL HIGH (ref 11.5–15.5)
WBC: 10.6 10*3/uL — ABNORMAL HIGH (ref 4.0–10.5)
nRBC: 0 % (ref 0.0–0.2)

## 2022-04-17 LAB — BASIC METABOLIC PANEL
Anion gap: 10 (ref 5–15)
BUN: 16 mg/dL (ref 8–23)
CO2: 22 mmol/L (ref 22–32)
Calcium: 9.1 mg/dL (ref 8.9–10.3)
Chloride: 103 mmol/L (ref 98–111)
Creatinine, Ser: 1.27 mg/dL — ABNORMAL HIGH (ref 0.44–1.00)
GFR, Estimated: 39 mL/min — ABNORMAL LOW (ref >60)
Glucose, Bld: 167 mg/dL — ABNORMAL HIGH (ref 70–99)
Potassium: 4.2 mmol/L (ref 3.5–5.1)
Sodium: 135 mmol/L (ref 135–145)

## 2022-04-17 LAB — LIPID PANEL
Cholesterol: 210 mg/dL — ABNORMAL HIGH (ref 0–200)
HDL: 55 mg/dL (ref >40)
LDL Cholesterol: 135 mg/dL — ABNORMAL HIGH (ref 0–99)
Total CHOL/HDL Ratio: 3.8 RATIO
Triglycerides: 99 mg/dL (ref <150)
VLDL: 20 mg/dL (ref 0–40)

## 2022-04-17 LAB — MRSA NEXT GEN BY PCR, NASAL: MRSA by PCR Next Gen: NOT DETECTED

## 2022-04-17 LAB — HEMOGLOBIN A1C
Hgb A1c MFr Bld: 4.6 % — ABNORMAL LOW (ref 4.8–5.6)
Mean Plasma Glucose: 85.32 mg/dL

## 2022-04-17 MED ORDER — DIAZEPAM 5 MG PO TABS: 5.0000 mg | ORAL_TABLET | Freq: Four times a day (QID) | ORAL | Status: DC | PRN

## 2022-04-17 MED ORDER — CHLORHEXIDINE GLUCONATE CLOTH 2 % EX PADS
6.0000 | MEDICATED_PAD | Freq: Every day | CUTANEOUS | Status: DC
  Administered 2022-04-17 – 2022-04-19 (×3): 6 via TOPICAL

## 2022-04-17 MED ORDER — ASPIRIN 325 MG PO TABS
325.0000 mg | ORAL_TABLET | Freq: Once | ORAL | Status: DC
  Filled 2022-04-17: qty 1

## 2022-04-17 MED ORDER — ATORVASTATIN CALCIUM 80 MG PO TABS
80.0000 mg | ORAL_TABLET | Freq: Every day | ORAL | Status: DC
  Administered 2022-04-18 – 2022-04-20 (×3): 80 mg via ORAL
  Filled 2022-04-17 (×3): qty 1

## 2022-04-17 MED ORDER — CLONIDINE HCL 0.1 MG PO TABS
0.1000 mg | ORAL_TABLET | Freq: Two times a day (BID) | ORAL | Status: AC
  Administered 2022-04-17 – 2022-04-20 (×6): 0.1 mg via ORAL
  Filled 2022-04-17 (×6): qty 1

## 2022-04-17 NOTE — Progress Notes (Signed)
Referring Physician(s): Dr Curly Shores  Supervising Physician: Luanne Bras  Patient Status:  [facility] - In-pt  Chief Complaint:  RMCA revascularization 2/2  Subjective:  Alert Up in bed Doing well Family in room  Allergies: Latex, Lisinopril, Povidone iodine, and Povidone-iodine  Medications: Prior to Admission medications   Medication Sig Start Date End Date Taking? Authorizing Provider  albuterol (VENTOLIN HFA) 108 (90 Base) MCG/ACT inhaler Inhale 2 puffs into the lungs every 4 (four) hours as needed for wheezing or shortness of breath. 04/09/22  Yes Nona Dell, Corene Cornea, MD  amLODipine (NORVASC) 2.5 MG tablet Take 2.5 mg by mouth 2 (two) times daily. 07/14/21  Yes [provider]  buPROPion (WELLBUTRIN XL) 300 MG 24 hr tablet Take 300 mg by mouth every morning. 07/29/21  Yes [provider]  busPIRone (BUSPAR) 5 MG tablet Take 1 tablet (5 mg total) by mouth 3 (three) times daily as needed (anxiety). 03/09/22  Yes Townsend Roger, MD  cloNIDine (CATAPRES) 0.1 MG tablet Take 0.1 mg by mouth 2 (two) times daily. 03/18/20  Yes [provider]  diclofenac (VOLTAREN) 25 MG EC tablet Take 1 tablet (25 mg total) by mouth 2 (two) times daily as needed for moderate pain. 03/09/22  Yes Townsend Roger, MD  diphenoxylate-atropine (LOMOTIL) 2.5-0.025 MG tablet Take 1 tablet by mouth 4 (four) times daily as needed for diarrhea or loose stools. 10/21/20  Yes Barb Merino, MD  Eszopiclone 3 MG TABS Take 3 mg by mouth at bedtime. 02/16/22  Yes [provider]  FEROSUL 325 (65 Fe) MG tablet Take 325 mg by mouth 2 (two) times daily. 03/03/22  Yes [provider]  fluticasone (FLONASE) 50 MCG/ACT nasal spray Place 1 spray into both nostrils in the morning and at bedtime. 02/26/22 02/26/23 Yes Townsend Roger, MD  furosemide (LASIX) 20 MG tablet Take 1 tablet (20 mg total) by mouth 2 (two) times daily. 04/09/22  Yes Townsend Roger, MD  Magnesium Oxide 500 MG CAPS Take  500 mg by mouth daily. 10/21/20  Yes [provider]  metoprolol succinate (TOPROL-XL) 25 MG 24 hr tablet Take 25 mg by mouth 2 (two) times daily. 03/03/22  Yes [provider]  Probiotic Product (ALIGN PO) Take 1 capsule by mouth daily.   Yes [provider]  doxycycline (MONODOX) 100 MG capsule Take 1 capsule (100 mg total) by mouth 2 (two) times daily. Patient not taking: Reported on 04/17/2022 02/26/22   Townsend Roger, MD  levothyroxine (SYNTHROID, LEVOTHROID) 50 MCG tablet Take 50 mcg by mouth daily before breakfast.  Patient not taking: Reported on 04/17/2022 12/09/16   [provider]     Vital Signs: BP 106/62   Pulse (!) 104   Temp (!) 97.5 F (36.4 C) (Oral)   Resp (!) 27   Ht '4\' 11"'$  (1.499 m)   Wt 142 lb 6.7 oz (64.6 kg)   SpO2 99%   BMI 28.76 kg/m   Physical Exam Vitals reviewed.  Constitutional:      Comments: Face symmetrical Tongue midline Smile=  Musculoskeletal:     Comments: Moving all 4s Rt with good strength and to command Good grip Rt groin NT no bleeding or hematoma Lt weaker but movement is to command   Neurological:     Mental Status: She is alert and oriented to person, place, and time.  Psychiatric:        Behavior: Behavior normal.     Imaging: DG CHEST PORT  1 VIEW  Result Date: 04/16/2022 CLINICAL DATA:  Endotracheal tube EXAM: PORTABLE CHEST 1 VIEW COMPARISON:  03/02/2022 FINDINGS: Endotracheal tube with tip measuring 2.3 cm above the carina. Shallow inspiration. Mild cardiac enlargement. Cardiac valve prosthesis. Lungs are clear. No pleural effusions. No pneumothorax. Mediastinal contours appear intact. Calcified and tortuous aorta. Degenerative changes in the spine and shoulders. IMPRESSION: Endotracheal tube appears in satisfactory position. Cardiac enlargement. Lungs are clear. Electronically Signed   By: Lucienne Capers M.D.   On: 04/16/2022 19:57   CT ANGIO HEAD NECK W WO CM (CODE STROKE)  Result Date:  04/16/2022 CLINICAL DATA:  Stroke suspected EXAM: CT ANGIOGRAPHY HEAD AND NECK TECHNIQUE: Multidetector CT imaging of the head and neck was performed using the standard protocol during bolus administration of intravenous contrast. Multiplanar CT image reconstructions and MIPs were obtained to evaluate the vascular anatomy. Carotid stenosis measurements (when applicable) are obtained utilizing NASCET criteria, using the distal internal carotid diameter as the denominator. RADIATION DOSE REDUCTION: This exam was performed according to the departmental dose-optimization program which includes automated exposure control, adjustment of the mA and/or kV according to patient size and/or use of iterative reconstruction technique. CONTRAST:  75 mL Omnipaque 350 COMPARISON:  CT head 06/24/2021 FINDINGS: CT HEAD FINDINGS Brain: No evidence of acute infarction, hemorrhage, mass, mass effect, or midline shift. No hydrocephalus or extra-axial collection. Redemonstrated hypodensity in the right frontal lobe, involving the right operculum, consistent with remote infarct. Lacunar infarct in right basal ganglia. Periventricular white matter changes, likely the sequela of chronic small vessel ischemic disease. Age-related cerebral volume loss. No Vascular: Possible hyperdense right MCA. No other hyperdense vessel. Atherosclerotic calcifications in the intracranial carotid and vertebral arteries. Skull: Negative for fracture or focal lesion. Sinuses/Orbits: Chronic opacification of the left sphenoid sinus. Minimal mucosal thickening in the right frontal sinus. Otherwise clear paranasal sinuses. Status post bilateral lens replacements. No acute finding in the orbits. Other: The mastoid air cells are well aerated. ASPECTS [facility] Stroke Program Early CT Score) - Ganglionic level infarction (caudate, lentiform nuclei, internal capsule, insula, M1-M3 cortex): 7 - Supraganglionic infarction (M4-M6 cortex): 3 Total score (0-10 with 10 being  normal): 10 CTA NECK FINDINGS Aortic arch: Standard branching. Imaged portion shows no evidence of aneurysm or dissection. Approximately 70% stenosis in the proximal left subclavian artery (series 9, image 262). No other significant stenosis of the major arch vessel origins. 80% stenosis in the right subclavian artery, after the carotid takeoff (series 14, image 99). Aortic atherosclerosis. Right carotid system: No evidence of dissection, occlusion, or hemodynamically significant stenosis (greater than 50%). Atherosclerotic disease at the bifurcation and in the proximal ICA is not hemodynamically significant. Left carotid system: No evidence of dissection, occlusion, or hemodynamically significant stenosis (greater than 50%). Atherosclerotic disease at the bifurcation and in the proximal ICA is not hemodynamically significant. Vertebral arteries: No evidence of dissection, occlusion, or hemodynamically significant stenosis (greater than 50%). Skeleton: No acute osseous abnormality. Degenerative changes in the cervical spine. Other neck: Multiple hypoenhancing nodules in the right greater than left thyroid lobe, the largest of which measures up to 1.5 cm. The thyroid was most recently evaluated with ultrasound in 2019. Upper chest: Emphysematous changes. No focal pulmonary opacity or pleural effusion. Review of the MIP images confirms the above findings CTA HEAD FINDINGS Evaluation is somewhat limited by bolus timing. Anterior circulation: Both internal carotid arteries are patent to the termini, without significant stenosis. A1 segments patent, hypoplastic or stenotic on the right. Suspect an azygous A2  versus occlusion the right A2. Mild stenosis in the distal left ACA (series 9, image 64). Anterior cerebral arteries otherwise are patent to their distal aspects. Occlusion of the distal right M1 (series 9, image 93), without evidence of distal reconstitution. No left M1 stenosis or occlusion. Left MCA branches are  perfused to their distal aspects without significant stenosis. Posterior circulation: Vertebral arteries patent to the vertebrobasilar junction, with severe stenosis in the mid left V4 (series 9, image 139 and moderate stenosis in the mid to distal right V4 (series 9, image 140). Posterior inferior cerebellar arteries patent proximally. Basilar patent to its distal aspect. Superior cerebellar arteries patent proximally. Patent left P1. Fetal origin of the right PCA from the right posterior communicating artery. The left posterior communicating artery is also patent. PCAs perfused to their distal aspects without stenosis. Venous sinuses: As permitted by contrast timing, patent. Anatomic variants: Fetal origin of the right PCA. Review of the MIP images confirms the above findings IMPRESSION: 1. Occlusion of the distal right M1. 2. 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 3. Severe stenosis in the mid left V4, moderate stenosis in the mid to distal right V4, and mild stenosis in the distal left ACA. 4. 70% stenosis in the proximal left subclavian artery and 80% stenosis in the proximal right subclavian artery. No other hemodynamically significant stenosis in the neck. 5. No acute intracranial process. 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 ASPECTS is 10. 6. Aortic atherosclerosis and emphysema. 7. Multiple hypoenhancing nodules in the right greater than left thyroid lobe, the largest of which measures up to 1.5 cm. The thyroid was most recently evaluated by ultrasound in 2019. If further evaluation is clinically indicated, taking into account the patient's age and clinical status, a nonemergent thyroid ultrasound could be obtained. Aortic Atherosclerosis (ICD10-I70.0) and Emphysema (ICD10-J43.9). Impression #1 was discussed by telephone on 04/16/2022 at 4:18 pm with provider [facility] . Impression #2 was communicated on 04/16/2022 at 4:43 pm to provider Dr. Lorrin Goodell via secure text paging. Electronically  Signed   By: Merilyn Baba M.D.   On: 04/16/2022 16:45    Labs:  CBC: Recent Labs    09/07/21 0000 04/16/22 1609 04/16/22 1616 04/16/22 1916 04/16/22 2046 04/17/22 0554  WBC 11.5  --  4.4  --   --  10.6*  HGB 9.4*   < > 11.1* 10.1* 11.9* 11.1*  HCT 29*   < > 35.9* 31.6* 35.0* 33.9*  PLT 422*  --  272  --   --  307   < > = values in this interval not displayed.    COAGS: Recent Labs    04/16/22 1616  INR 1.1  APTT 30    BMP: Recent Labs    09/07/21 0000 04/16/22 1609 04/16/22 1616 04/16/22 2046 04/17/22 0554  NA 136* 138 137 137 135  K 4.5 3.3* 3.3* 3.6 4.2  CL 103 102 102  --  103  CO2 25*  --  23  --  22  GLUCOSE  --  178* 176*  --  167*  BUN '18 17 15  '$ --  16  CALCIUM 9.0  --  9.0  --  9.1  CREATININE 1.1 1.60* 1.61*  --  1.27*  GFRNONAA  --   --  29*  --  39*    LIVER FUNCTION TESTS: Recent Labs    09/07/21 0000 04/16/22 1616  BILITOT  --  0.7  AST 25 31  ALT 16 22  ALKPHOS 52 53  PROT  --  6.2*  ALBUMIN 3.6 3.4*    Assessment and Plan:  R MCA revascularization in IR with Dr Estanislado Pandy 2/2 Doing well Moving all 4s-- Rt greater than Lt Will follow  Electronically Signed: Lavonia Drafts, PA-C 04/17/2022, 11:55 AM   I spent a total of 15 Minutes at the the patient's bedside AND on the patient's hospital floor or unit, greater than 50% of which was counseling/coordinating care for R MCA revascularization

## 2022-04-17 NOTE — Progress Notes (Signed)
SLP Cancellation Note  Patient Details Name: Susannah Carbin MRN: [id number] DOB: 25-Feb-1926   Cancelled treatment:       Reason Eval/Treat Not Completed: Medical issues which prohibited therapy; pt currently intubated; ST will continue efforts when pt able to complete BSE.   Pat Tiarna Koppen,M.S., CCC-SLP 04/17/2022, 8:12 AM

## 2022-04-17 NOTE — Progress Notes (Addendum)
PT Cancellation Note  Patient Details Name: [name] MRN: [id number] DOB: 1925/09/06   Cancelled Treatment:    Reason Eval/Treat Not Completed: Active bedrest order;Patient not medically ready. TNK administered 04/16/22 1618. Due to covid+, pt remains intubated and sedated following thrombectomy 04/16/22.    1304 addendum: Pt now extubated. Bedrest order remains in place.   Lorriane Shire 04/17/2022, 7:05 AM

## 2022-04-17 NOTE — Progress Notes (Signed)
eLink Physician-Brief Progress Note Patient Name: Helon Wisinski DOB: 24-Jun-1925 MRN: [id number]   Date of Service  04/17/2022  HPI/Events of Note  BP 156/76 by a-line,   a little agitated on camera, looks like she takes clonidine 0.'1mg'$  twice daily at home, didn't see on MAR.  S/p tnk, neurology notes goal SBP 120 to 140. A fib -HR 131.  On busapar for anxiety  Camera: HR 112, sats good. Discussed with RN.     eICU Interventions  Trial of low dose clonidine for now. Watch HR. Asp precautions.      Intervention Category Intermediate Interventions: Hypertension - evaluation and management  Elmer Sow 04/17/2022, 9:31 PM

## 2022-04-17 NOTE — Progress Notes (Addendum)
STROKE TEAM PROGRESS NOTE   INTERVAL HISTORY Her daughter and son are at the bedside.  Assessment and plan of care reviewed.   S/P TNK and Thrombectomy of R MCA with TICI 3 revascularization.   Patient on PS vent settings, 40% 5/5 in preparation for extubation.Appreciate CCM assistance with vent management.  Sedation paused for exam. Cleviprex gtt utilized to maintain BP within goal.   Patient follows commands, moves all extremities except LUE.    Vitals:   04/17/22 0615 04/17/22 0700 04/17/22 0800 04/17/22 0806  BP:  120/86 (!) 126/100 (!) 126/100  Pulse: 93 (!) 103 98 83  Resp: (!) 22 (!) 26 (!) 22 (!) 21  Temp:      TempSrc:      SpO2: 99% 99% 100% 100%  Weight:      Height:       CBC:  Recent Labs  Lab 04/16/22 1616 04/16/22 1916 04/16/22 2046 04/17/22 0554  WBC 4.4  --   --  10.6*  NEUTROABS 2.7  --   --  9.7*  HGB 11.1*   < > 11.9* 11.1*  HCT 35.9*   < > 35.0* 33.9*  MCV 87.6  --   --  85.2  PLT 272  --   --  307   < > = values in this interval not displayed.   Basic Metabolic Panel:  Recent Labs  Lab 04/16/22 1616 04/16/22 1916 04/16/22 2046 04/17/22 0554  NA 137  --  137 135  K 3.3*  --  3.6 4.2  CL 102  --   --  103  CO2 23  --   --  22  GLUCOSE 176*  --   --  167*  BUN 15  --   --  16  CREATININE 1.61*  --   --  1.27*  CALCIUM 9.0  --   --  9.1  MG  --  1.7  --   --    Lipid Panel:  Recent Labs  Lab 04/17/22 0554  CHOL 210*  TRIG 99  HDL 55  CHOLHDL 3.8  VLDL 20  LDLCALC 135*   HgbA1c:  Recent Labs  Lab 04/17/22 0554  HGBA1C 4.6*   Urine Drug Screen: No results for input(s): "LABOPIA", "COCAINSCRNUR", "LABBENZ", "AMPHETMU", "THCU", "LABBARB" in the last 168 hours.  Alcohol Level  Recent Labs  Lab 04/16/22 1616  ETH <10    IMAGING past 24 hours DG CHEST PORT 1 VIEW  Result Date: 04/16/2022 CLINICAL DATA:  Endotracheal tube EXAM: PORTABLE CHEST 1 VIEW COMPARISON:  03/02/2022 FINDINGS: Endotracheal tube with tip measuring 2.3 cm  above the carina. Shallow inspiration. Mild cardiac enlargement. Cardiac valve prosthesis. Lungs are clear. No pleural effusions. No pneumothorax. Mediastinal contours appear intact. Calcified and tortuous aorta. Degenerative changes in the spine and shoulders. IMPRESSION: Endotracheal tube appears in satisfactory position. Cardiac enlargement. Lungs are clear. Electronically Signed   By: Lucienne Capers M.D.   On: 04/16/2022 19:57   CT ANGIO HEAD NECK W WO CM (CODE STROKE)  Result Date: 04/16/2022 CLINICAL DATA:  Stroke suspected EXAM: CT ANGIOGRAPHY HEAD AND NECK TECHNIQUE: Multidetector CT imaging of the head and neck was performed using the standard protocol during bolus administration of intravenous contrast. Multiplanar CT image reconstructions and MIPs were obtained to evaluate the vascular anatomy. Carotid stenosis measurements (when applicable) are obtained utilizing NASCET criteria, using the distal internal carotid diameter as the denominator. RADIATION DOSE REDUCTION: This exam was performed according to the departmental  dose-optimization program which includes automated exposure control, adjustment of the mA and/or kV according to patient size and/or use of iterative reconstruction technique. CONTRAST:  75 mL Omnipaque 350 COMPARISON:  CT head 06/24/2021 FINDINGS: CT HEAD FINDINGS Brain: No evidence of acute infarction, hemorrhage, mass, mass effect, or midline shift. No hydrocephalus or extra-axial collection. Redemonstrated hypodensity in the right frontal lobe, involving the right operculum, consistent with remote infarct. Lacunar infarct in right basal ganglia. Periventricular white matter changes, likely the sequela of chronic small vessel ischemic disease. Age-related cerebral volume loss. No Vascular: Possible hyperdense right MCA. No other hyperdense vessel. Atherosclerotic calcifications in the intracranial carotid and vertebral arteries. Skull: Negative for fracture or focal lesion.  Sinuses/Orbits: Chronic opacification of the left sphenoid sinus. Minimal mucosal thickening in the right frontal sinus. Otherwise clear paranasal sinuses. Status post bilateral lens replacements. No acute finding in the orbits. Other: The mastoid air cells are well aerated. ASPECTS [facility] Stroke Program Early CT Score) - Ganglionic level infarction (caudate, lentiform nuclei, internal capsule, insula, M1-M3 cortex): 7 - Supraganglionic infarction (M4-M6 cortex): 3 Total score (0-10 with 10 being normal): 10 CTA NECK FINDINGS Aortic arch: Standard branching. Imaged portion shows no evidence of aneurysm or dissection. Approximately 70% stenosis in the proximal left subclavian artery (series 9, image 262). No other significant stenosis of the major arch vessel origins. 80% stenosis in the right subclavian artery, after the carotid takeoff (series 14, image 99). Aortic atherosclerosis. Right carotid system: No evidence of dissection, occlusion, or hemodynamically significant stenosis (greater than 50%). Atherosclerotic disease at the bifurcation and in the proximal ICA is not hemodynamically significant. Left carotid system: No evidence of dissection, occlusion, or hemodynamically significant stenosis (greater than 50%). Atherosclerotic disease at the bifurcation and in the proximal ICA is not hemodynamically significant. Vertebral arteries: No evidence of dissection, occlusion, or hemodynamically significant stenosis (greater than 50%). Skeleton: No acute osseous abnormality. Degenerative changes in the cervical spine. Other neck: Multiple hypoenhancing nodules in the right greater than left thyroid lobe, the largest of which measures up to 1.5 cm. The thyroid was most recently evaluated with ultrasound in 2019. Upper chest: Emphysematous changes. No focal pulmonary opacity or pleural effusion. Review of the MIP images confirms the above findings CTA HEAD FINDINGS Evaluation is somewhat limited by bolus timing.  Anterior circulation: Both internal carotid arteries are patent to the termini, without significant stenosis. A1 segments patent, hypoplastic or stenotic on the right. Suspect an azygous A2 versus occlusion the right A2. Mild stenosis in the distal left ACA (series 9, image 64). Anterior cerebral arteries otherwise are patent to their distal aspects. Occlusion of the distal right M1 (series 9, image 93), without evidence of distal reconstitution. No left M1 stenosis or occlusion. Left MCA branches are perfused to their distal aspects without significant stenosis. Posterior circulation: Vertebral arteries patent to the vertebrobasilar junction, with severe stenosis in the mid left V4 (series 9, image 139 and moderate stenosis in the mid to distal right V4 (series 9, image 140). Posterior inferior cerebellar arteries patent proximally. Basilar patent to its distal aspect. Superior cerebellar arteries patent proximally. Patent left P1. Fetal origin of the right PCA from the right posterior communicating artery. The left posterior communicating artery is also patent. PCAs perfused to their distal aspects without stenosis. Venous sinuses: As permitted by contrast timing, patent. Anatomic variants: Fetal origin of the right PCA. Review of the MIP images confirms the above findings IMPRESSION: 1. Occlusion of the distal right M1. 2. Azygous  A2 versus occlusion of the right A2. 3. Severe stenosis in the mid left V4, moderate stenosis in the mid to distal right V4, and mild stenosis in the distal left ACA. 4. 70% stenosis in the proximal left subclavian artery and 80% stenosis in the proximal right subclavian artery. No other hemodynamically significant stenosis in the neck. 5. No acute intracranial process. 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 ASPECTS is 10. 6. Aortic atherosclerosis and emphysema. 7. Multiple hypoenhancing nodules in the right greater than left thyroid lobe, the largest of which measures up to 1.5 cm.  The thyroid was most recently evaluated by ultrasound in 2019. If further evaluation is clinically indicated, taking into account the patient's age and clinical status, a nonemergent thyroid ultrasound could be obtained. Aortic Atherosclerosis (ICD10-I70.0) and Emphysema (ICD10-J43.9). Impression #1 was discussed by telephone on 04/16/2022 at 4:18 pm with provider [facility] . Impression #2 was communicated on 04/16/2022 at 4:43 pm to provider Dr. Lorrin Goodell via secure text paging. Electronically Signed   By: Merilyn Baba M.D.   On: 04/16/2022 16:45    PHYSICAL EXAM  Constitutional: critically ill. intubated.  HEENT: [place], AT Respiratory:PS on vent, unlabored Skin: WDI   Neuro: Mental Status: Follows commands.   Cranial Nerves: No tracking, Weak blink to threat on the right, PERRL.  Unable to assess facial symmetry, tongue protrusion d/t intubation.  Motor/Sensory: Localizes to pain in RUE, withdraw to pain BLE, R>L. No withdraw to pain LUE.  ASSESSMENT/PLAN [name] is a 89 y.o. female with history of atrial fibrillation not on anticoagulation, aortic stenosis status post TAVR, CAD, CKD stage III, depression, breast cancer, hyperlipidemia, hypertension, hypothyroidism and arthritis who was last seen well by her caregiver at 1500.  Patient had gone to the bathroom and then began to complain of chest pain.  EMS was called, and when EMS arrived, patient developed dysarthria, left-sided weakness, right gaze deviation and left-sided neglect.  Head CT in emergency department showed no acute abnormalities with aspects of 10.  On CTA, right M1 occlusion was noted.  Discussion was had with patient's son, and consent was obtained for TNK administration and mechanical thrombectomy.  Patient was made a DNR per her previously stated wishes as verbalized by her son.  TNK was administered at 1618 with a short delay due to blood pressure being out of range, but this resolved with  administration of labetalol. Patient was started on Cleviprex as her blood pressure began to rise after TNK administration.   LKW: 1500 TNK given?:  Yes, at 1618 after discussion with patient's son, short delay due to blood pressure being out of range IR Thrombectomy?  Yes, patient taken to IR for thrombectomy of right M1 at 1630 Modified Rankin Scale: 3-Moderate disability-requires help but walks WITHOUT assistance  Acute ischemic stroke due to right MCA M1 occlusion in patient with atrial fibrillation not on anticoagulation   Code Stroke CT head: No acute intracranial process. 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 ASPECTS is 10. Repeat CT head post-24hours: CTA head & neck: Occlusion of the distal right M1. 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 Severe stenosis in the mid left V4, moderate stenosis in the mid to distal right V4, and mild stenosis in the distal left ACA. 70% stenosis in the proximal left subclavian artery and 80% stenosis in the proximal right subclavian artery. No other hemodynamically significant stenosis in the neck Post IR CT: Negative hemorrhage MRI  pending  2D Echo:  LVEF 45-50%, Global Hypokinesis, Severe Concentric LVH Mildly reduced RVSF Small Pericardial Effusion Present Trivial MVR  Atrial Fibrillation LDL 135 HgbA1c 4.6 VTE prophylaxis - SCDs    Diet   Diet NPO time specified   No antithrombotic prior to admission, no anti-thrombotic until 24hours post-TNK.  Therapy recommendations:  pending Disposition:  pending  Hypertension Home meds:  norvasc, clonidine; on hold SBP goal 120-140 for first 24 hours post-thrombectomy Long-term BP goal normotensive  Hyperlipidemia Home meds:  lipitor '10mg'$  LDL 135, goal < 70 Increased to high-intensity, Lipitor '80mg'$  Continue statin at discharge   Other Stroke Risk Factors Advanced Age >/= 44  Family hx stroke (Mother) Coronary artery disease  Other Active Problems Recent COVID Positive  December 2023 Severe AS s/p TAVR AKI on CKD stage III Hypothyroidism  Hospital day # 1   Pt seen by Neuro NP/APP and later by MD. Note/plan to be edited by MD as needed.    Otelia Santee, DNP, AGACNP-BC [facility] Please use AMION for pager and EPIC for messaging  ATTENDING ATTESTATION:  89 year old with history of atrial fibrillation not on anticoagulation with right MCA stroke status post TNK and thrombectomy of right MCA.  Brain MRI shows right temporal lobe, corpus callosum, right corona radiata and left centrum semi ovale likely cardio embolic. Will start aspirin 325 after 24 hours complete around 6 PM and consider anticoagulation after further discussion with family.  I was able to examine her after she was extubated in the afternoon and she was following commands, alert and awake, naming objects.  Full strength in the right side, she was able to lift her left arm and leg off the bed.  Appreciate CCM assistance.  Dr. Reeves Forth evaluated pt independently, reviewed imaging, chart, labs. Discussed and formulated plan with the Resident/APP. Changes were made to the note where appropriate. Please see APP/resident note above for details.     This patient is critically ill due to respiratory distress, stroke s/p tPA and IR and at significant risk of neurological worsening, death form heart failure, respiratory failure, recurrent stroke, bleeding from [facility], seizure, sepsis. This patient's care requires constant monitoring of vital signs, hemodynamics, respiratory and cardiac monitoring, review of multiple databases, neurological assessment, discussion with family, other specialists and medical decision making of high complexity. I spent 35 minutes of neurocritical care time in the care of this patient.   Jazline Cumbee,MD    To contact Stroke Continuity provider, please refer to [email or web address] After hours, contact General Neurology

## 2022-04-17 NOTE — Progress Notes (Signed)
OT Cancellation Note  Patient Details Name: [name] MRN: [id number] DOB: 04-06-1925   Cancelled Treatment:    Reason Eval/Treat Not Completed: Patient not medically ready;Active bedrest order.Due to covid+, pt remains intubated and sedated following arteriogram performed 04/16/22. Will continue to follow for appropriateness for evaluation.  [place] Office [phone]    Almon Register 04/17/2022, 7:18 AM

## 2022-04-17 NOTE — Procedures (Signed)
Extubation Procedure Note  Patient Details:   Name: Kajol Crispen DOB: Sep 09, 1925 MRN: [id number]   Airway Documentation:    Vent end date: (not recorded) Vent end time: (not recorded)   Evaluation  O2 sats: stable throughout Complications: No apparent complications Patient did tolerate procedure well. Bilateral Breath Sounds: Clear, Diminished   Yes  Pt placed on $lpm [place]. VS WNR.   Sahana Boyland 04/17/2022, 10:13 AM

## 2022-04-17 NOTE — Plan of Care (Signed)
Progressing toward goals. 

## 2022-04-17 NOTE — Progress Notes (Signed)
Cycle time in 79s Was positive for covid in Dec CXR clear Can DC airborne.  Erskine Emery MD PCCM

## 2022-04-17 NOTE — Progress Notes (Signed)
NAME:  [name], MRN:  [id number], DOB:  August 30, 1925, LOS: 1 ADMISSION DATE:  04/16/2022 CONSULTATION DATE:  04/16/2022 REFERRING MD:  Genevie Cheshire CHIEF COMPLAINT:  R MCA occlusion   History of Present Illness:  89 year old woman who presented to [facility] on 2/2 as a Code Stroke. LKW 1500; c/o CP and EMS was called. On EMS arrival patient had dysarthria and L-sided weakness/neglect with R gaze deviation. Brought to [facility] for further evaluation. PMHx significant for HTN, HLD, CAD (s/p DES to RCA 2010), AS s/p TAVR (2019), chronic diastolic HF (Echo 8341 EF 50-55%, mod LVH), Afib (not on AC), CKD stage III, hypothyroidism, breast CA, GERD, depression/anxiety.  On ED arrival, Code Stroke initiated. CT Head demonstrated NAICA; CTA Head/Neck positive for R MCA M1 occlusion. TNK was administered 1618 (slight delay due to elevated BP). Cleviprex initiated. Taken to [facility] for mechanical thrombectomy (Dr. Estanislado Pandy) with successful recanalization (TICI 3). Post-procedure CT Head negative for ICH. Of note, manual pressure/QuickClot needed to R groin access site due to Angio-Seal closure device failure. Patient incidentally COVID positive and left intubated post-procedure.  PCCM consulted for management of MV/hemodynamics.  Pertinent Medical History:   Past Medical History:  Diagnosis Date   Abnormal gait 06/24/2015   Last Assessment & Plan:   Relevant Hx:  Course:  Daily Update:  Today's Plan:uses her cane for this and no recent falls     Electronically signed by: Mayer Camel, NP  07/23/15 1529   Acute cough 08/25/2011   Acute kidney injury superimposed on chronic kidney disease ([place]) 10/18/2020   Anemia due to stage 3 chronic kidney disease ([place]) 06/24/2015   Last Assessment & Plan: Formatting of this note might be different from the original. Relevant Hx: Course: Daily Update: Today's Plan:update her CMP for her Electronically signed by: Mayer Camel, NP 07/23/15 1528  Formatting of this note might be different from the original. Last Assessment & Plan: Relevant Hx: Course: Daily Update: Today's Plan:update her CMP for her Electronically    Anxiety 06/24/2015   Last Assessment & Plan:   Relevant Hx:  Course:  Daily Update:  Today's Plan:this is stable for her at this time     Electronically signed by: Mayer Camel, NP  07/23/15 1529   Aortic stenosis, severe    a. 07/2017: s/p TAVR w/ an Edwards Sapien 3 THV (size 26 mm, model # [id number], serial # [id number])   Atherosclerosis of coronary artery 06/24/2015   Formatting of this note might be different from the original. Ostial RCA DES, 2010 Last Assessment & Plan: stable Last Assessment & Plan: Relevant Hx: Course: Daily Update: Today's Plan:she has felt she was stable from this and she has cardiologist she has followed with . Electronically signed by: Mayer Camel, NP 07/23/15 1523 Ostial RCA DES, 2010 Last Assessment & Plan: stable   Atrial fibrillation ([place]) 03/19/2022   Bilateral hearing loss 06/25/2015   Bilateral sensorineural hearing loss 12/26/2020   Breast cancer, right breast ([place]) 03/24/2008   CAD (coronary artery disease)    a. 2010: s/p stent to RCA   Chronic diastolic (congestive) heart failure ([place]) 08/04/2017   Chronic kidney disease, stage III (moderate) (HCC)    Chronic recurrent major depressive disorder ([place]) 06/24/2015   Last Assessment & Plan:   Relevant Hx:  Course:  Daily Update:  Today's Plan:this is stable for her     Electronically signed by: Mayer Camel, NP  07/23/15 1530   Chronic  sinusitis    Coronary artery disease involving native coronary artery of native heart with angina pectoris ([place]) 06/24/2015   Formatting of this note might be different from the original. Overview: Ostial RCA DES, 2010 Last Assessment & Plan: stable Last Assessment & Plan: Formatting of this note might be different from the original. Relevant Hx: Course: Daily Update:  Today's Plan:she has felt she was stable from this and she has cardiologist she has followed with . Electronically signed by: Mayer Camel, N   Coronary artery disease involving native coronary artery of native heart without angina pectoris    Ostial RCA DES, 2010   Decreased hearing of both ears 06/25/2015   Deficiency anemia 03/11/2021   Dehydration 10/19/2020   Depressive disorder    Dizziness 08/14/2018   [place] of macula of both eyes 09/12/2018   Ductal carcinoma in situ of breast 06/23/2007   Encounter for long-term current use of high risk medication 06/24/2015   Epiretinal membrane (ERM) of left eye 09/12/2018   Essential hypertension, benign 02/16/2013   Gastro-esophageal reflux disease with esophagitis 06/24/2015   Last Assessment & Plan:   Relevant Hx:  Course:  Daily Update:  Today's Plan:she is stable from this      Electronically signed by: Mayer Camel, NP  07/23/15 1526   Gastroesophageal reflux disease without esophagitis 12/27/2016   History of breast cancer    a. s/p lumpectomies and XRT   Hyperlipemia    Hyperlipidemia 02/16/2013   Hypertension    Hypothyroid    Hypothyroidism, acquired 06/24/2015   Last Assessment & Plan:   Relevant Hx:  Course:  Daily Update:  Today's Plan:update her TSH for her today     Electronically signed by: Mayer Camel, NP  07/23/15 1526   Incisional hernia, without obstruction or gangrene 02/24/2019   Insomnia    Intermediate stage nonexudative age-related macular degeneration of both eyes 01/19/2021   Irritation of oral cavity 11/23/2019   Malaise and fatigue 06/24/2015   Last Assessment & Plan:   Relevant Hx:  Course:  Daily Update:  Today's Plan:she is up and down with her energy and she feels that for the most part she has been stable     Electronically signed by: Mayer Camel, NP  07/23/15 1533   Malignant neoplasm of central portion of left female breast ([place]) 06/24/2015   Last  Assessment & Plan:   Relevant Hx:  Course:  Daily Update:  Today's Plan:she is now receiving injections once a month for this and is hopeful will keep this at bay as this has been recurrent     Electronically signed by: Mayer Camel, NP  07/23/15 1530   Melena 03/19/2022   Memory change 07/23/2015   Last Assessment & Plan:   Relevant Hx:  Course:  Daily Update:  Today's Plan:she is going to take the aricept at supper     Electronically signed by: Mayer Camel, NP  07/23/15 1532   Memory loss    Multiple thyroid nodules 02/09/2012   Osteoarthritis    Osteoporosis 03/24/2020   Other allergic rhinitis 12/27/2016   Plantar fat pad atrophy 01/19/2016   Posterior vitreous detachment of left eye 09/12/2018   Primary insomnia 06/24/2015   Last Assessment & Plan:   Relevant Hx:  Course:  Daily Update:  Today's Plan:she feels that this is stable for her at this time     Electronically signed by: Mayer Camel, NP  07/23/15 1531  Primary localized osteoarthrosis, ankle and foot 01/15/2016   Primary osteoarthritis involving multiple joints 06/24/2015   Last Assessment & Plan:   Relevant Hx:  Course:  Daily Update:  Today's Plan:she is stable from her joint though she has discomfort if she overexerts.     Electronically signed by: Mayer Camel, NP  07/23/15 1528   Reflux esophagitis    S/P TAVR (transcatheter aortic valve replacement) 08/02/2017   Severe aortic stenosis 02/16/2013   Physician Review  Conclusions: 1. Mild concentric left ventricular hypertrophy.  2. Left ventricular ejection fraction estimated by 2D at 60-65 percent.  3. There were no regional wall motion abnormalities.  4. Mild mitral annular calcification.  5. Trace mitral valve regurgitation.  6. Trivial tricuspid regurgitation.  7. Moderate increased thickness and calcification of the trileaflet aortic val   Stage III chronic kidney disease ([place]) 06/24/2015   Last Assessment & Plan:    Relevant Hx:  Course:  Daily Update:  Today's Plan:update her CMP for her     Electronically signed by: Mayer Camel, NP  07/23/15 1528   Thyroid nodule 06/24/2015   Vitreous membranes and strands 01/19/2021   PPV on 02/11/2021 OS   Significant Hospital Events: Including procedures, antibiotic start and stop dates in addition to other pertinent events   2/2 - Presented to [facility] ED via EMS for CP, then L-sided weakness/neglect, R gaze deviation and dysarthria. LKW 1500. CT Head negative, CTA Head/Neck +R MCA M1 occlusion. TNK given 1618. Taken to [facility] for mechanical thrombectomy with recanalization of vessel (TICI 3). COVID+, left intubated. PCCM consulted for MV.  Interim History / Subjective:  No events. Reaching for tube. On PS.  Objective:  Blood pressure (!) 126/100, pulse 99, temperature (!) 97.5 F (36.4 C), temperature source Oral, resp. rate 19, height '4\' 11"'$  (1.499 m), weight 64.6 kg, SpO2 99 %.    Vent Mode: PSV;CPAP FiO2 (%):  [40 %-100 %] 40 % Set Rate:  [18 bmp] 18 bmp Vt Set:  [340 mL] 340 mL PEEP:  [5 cmH20] 5 cmH20 Pressure Support:  [5 cmH20] 5 cmH20 Plateau Pressure:  [14 cmH20] 14 cmH20   Intake/Output Summary (Last 24 hours) at 04/17/2022 0919 Last data filed at 04/17/2022 0800 Gross per 24 hour  Intake 2966.31 ml  Output 505 ml  Net 2461.31 ml   Filed Weights   04/16/22 1600  Weight: 64.6 kg   Physical Examination: Normal vent mechanics Ext warm Lungs clear Heart sounds regular Groin access site looks okay L sided weakness  Cr better WBC up slightly  Resolved Hospital Problem List:    Assessment & Plan:  Right MCA M1 occlusion- S/p mechanical thrombectomy, TNK Post-procedure R groin hematoma s/p Angio-Seal closure device; stable Periprocedural respiratory insufficiency requiring mechanical ventilation COVID-19 +: cycle time 41, noninfectious/ incidental Atrial fibrillation Severe AS s/p TAVR HTN HLD AKI on CKD stage  III Hypothyroidism  - GDMT and AC resumption per primary - Vent bundle, wean to extubate - DC airborne isolation - Rehab, SLP etc - Home meds as ordered  Best Practice: (right click and "Reselect all SmartList Selections" daily)   Diet/type: NPO DVT prophylaxis: SCDs, AC per Neuro/NIR GI prophylaxis: PPI Lines: N/A Foley:  N/A Code Status:  DNR Last date of multidisciplinary goals of care discussion [Per Primary Team]  31 min cc time Erskine Emery MD PCCM

## 2022-04-17 NOTE — Progress Notes (Signed)
SLP Cancellation Note  Patient Details Name: [name] MRN: [id number] DOB: [birth year]   Cancelled treatment:       Reason Eval/Treat Not Completed: Patient at procedure or test/unavailable; attempted BSE and pt was OTF.  WIll continue efforts next date.   Pat Ledger Heindl,M.S., CCC-SLP 04/17/2022, 4:03 PM

## 2022-04-18 DIAGNOSIS — Z8673 Personal history of transient ischemic attack (TIA), and cerebral infarction without residual deficits: Secondary | ICD-10-CM | POA: Diagnosis not present

## 2022-04-18 DIAGNOSIS — I639 Cerebral infarction, unspecified: Secondary | ICD-10-CM | POA: Diagnosis not present

## 2022-04-18 LAB — CBC WITH DIFFERENTIAL/PLATELET
Abs Immature Granulocytes: 0 10*3/uL (ref 0.00–0.07)
Basophils Absolute: 0.1 10*3/uL (ref 0.0–0.1)
Basophils Relative: 1 %
Eosinophils Absolute: 0 10*3/uL (ref 0.0–0.5)
Eosinophils Relative: 0 %
HCT: 29.8 % — ABNORMAL LOW (ref 36.0–46.0)
Hemoglobin: 9.8 g/dL — ABNORMAL LOW (ref 12.0–15.0)
Lymphocytes Relative: 12 %
Lymphs Abs: 1.2 10*3/uL (ref 0.7–4.0)
MCH: 28.2 pg (ref 26.0–34.0)
MCHC: 32.9 g/dL (ref 30.0–36.0)
MCV: 85.9 fL (ref 80.0–100.0)
Monocytes Absolute: 0.4 10*3/uL (ref 0.1–1.0)
Monocytes Relative: 4 %
Neutro Abs: 8.2 10*3/uL — ABNORMAL HIGH (ref 1.7–7.7)
Neutrophils Relative %: 83 %
Platelets: 276 10*3/uL (ref 150–400)
RBC: 3.47 MIL/uL — ABNORMAL LOW (ref 3.87–5.11)
RDW: 22.6 % — ABNORMAL HIGH (ref 11.5–15.5)
WBC: 9.9 10*3/uL (ref 4.0–10.5)
nRBC: 0 % (ref 0.0–0.2)
nRBC: 0 /100 WBC

## 2022-04-18 LAB — BASIC METABOLIC PANEL
Anion gap: 9 (ref 5–15)
BUN: 16 mg/dL (ref 8–23)
CO2: 22 mmol/L (ref 22–32)
Calcium: 9.1 mg/dL (ref 8.9–10.3)
Chloride: 104 mmol/L (ref 98–111)
Creatinine, Ser: 1.18 mg/dL — ABNORMAL HIGH (ref 0.44–1.00)
GFR, Estimated: 43 mL/min — ABNORMAL LOW (ref >60)
Glucose, Bld: 141 mg/dL — ABNORMAL HIGH (ref 70–99)
Potassium: 3.6 mmol/L (ref 3.5–5.1)
Sodium: 135 mmol/L (ref 135–145)

## 2022-04-18 LAB — TRIGLYCERIDES: Triglycerides: 160 mg/dL — ABNORMAL HIGH (ref <150)

## 2022-04-18 MED ORDER — BUSPIRONE HCL 10 MG PO TABS
5.0000 mg | ORAL_TABLET | Freq: Three times a day (TID) | ORAL | Status: DC | PRN
  Administered 2022-04-18 – 2022-04-19 (×2): 5 mg via ORAL
  Filled 2022-04-18 (×2): qty 1

## 2022-04-18 MED ORDER — BUPROPION HCL ER (XL) 150 MG PO TB24
300.0000 mg | ORAL_TABLET | Freq: Every morning | ORAL | Status: DC
  Administered 2022-04-19 – 2022-04-20 (×2): 300 mg via ORAL
  Filled 2022-04-18 (×2): qty 2

## 2022-04-18 MED ORDER — DICLOFENAC SODIUM 25 MG PO TBEC: 25.0000 mg | DELAYED_RELEASE_TABLET | Freq: Two times a day (BID) | ORAL | Status: DC | PRN

## 2022-04-18 MED ORDER — METOPROLOL TARTRATE 12.5 MG HALF TABLET
12.5000 mg | ORAL_TABLET | Freq: Two times a day (BID) | ORAL | Status: DC
  Administered 2022-04-18 (×2): 12.5 mg via ORAL
  Filled 2022-04-18 (×2): qty 1

## 2022-04-18 MED ORDER — METOPROLOL TARTRATE 12.5 MG HALF TABLET
12.5000 mg | ORAL_TABLET | Freq: Three times a day (TID) | ORAL | Status: DC
  Administered 2022-04-19 – 2022-04-20 (×5): 12.5 mg via ORAL
  Filled 2022-04-18 (×5): qty 1

## 2022-04-18 NOTE — Evaluation (Signed)
Speech Language Pathology Evaluation Patient Details Name: Deriana Vanderhoef MRN: [id number] DOB: [birth year] Today's Date: 04/18/2022 Time: 5465-6812 SLP Time Calculation (min) (ACUTE ONLY): 24 min  Problem List:  Patient Active Problem List   Diagnosis Date Noted   Status post stroke 04/16/2022   Acute ischemic stroke ([place]) 04/16/2022   Middle cerebral artery embolism, right 04/16/2022   Aortic stenosis, severe 04/08/2022   CAD (coronary artery disease) 04/08/2022   Chronic kidney disease, stage III (moderate) ([place]) 04/08/2022   Hypothyroid 04/08/2022   Insomnia 04/08/2022   Memory loss 04/08/2022   Reflux esophagitis 04/08/2022   Osteoarthritis 04/01/2022   Depressive disorder 04/01/2022   Chronic sinusitis 04/01/2022   Atrial fibrillation ([place]) 03/19/2022   Melena 03/19/2022   Deficiency anemia 03/11/2021    Class: Chronic   Vitreous membranes and strands 01/19/2021   Intermediate stage nonexudative age-related macular degeneration of both eyes 01/19/2021   Bilateral sensorineural hearing loss 12/26/2020   Dehydration 10/19/2020   Acute kidney injury superimposed on chronic kidney disease ([place]) 10/18/2020   Osteoporosis 03/24/2020    Class: Chronic   Irritation of oral cavity 11/23/2019   Incisional hernia, without obstruction or gangrene 02/24/2019   Drusen of macula of both eyes 09/12/2018   Epiretinal membrane (ERM) of left eye 09/12/2018   Posterior vitreous detachment of left eye 09/12/2018   Dizziness 08/14/2018   Chronic diastolic (congestive) heart failure ([place]) 08/04/2017   Hyperlipemia    Hypertension    History of breast cancer    S/P TAVR (transcatheter aortic valve replacement) 08/02/2017   Other allergic rhinitis 12/27/2016   Gastroesophageal reflux disease without esophagitis 12/27/2016   Plantar fat pad atrophy 01/19/2016   Primary localized osteoarthrosis, ankle and foot 01/15/2016   Memory change 07/23/2015   Decreased hearing of both ears  06/25/2015   Bilateral hearing loss 06/25/2015   Abnormal gait 06/24/2015   Anxiety 06/24/2015   Malignant neoplasm of central portion of left female breast ([place]) 06/24/2015   Chronic recurrent major depressive disorder ([place]) 06/24/2015   Encounter for long-term current use of high risk medication 06/24/2015   Gastro-esophageal reflux disease with esophagitis 06/24/2015   Hypothyroidism, acquired 06/24/2015   Malaise and fatigue 06/24/2015   Primary insomnia 06/24/2015   Primary osteoarthritis involving multiple joints 06/24/2015   Stage III chronic kidney disease ([place]) 06/24/2015   Thyroid nodule 06/24/2015   Atherosclerosis of coronary artery 06/24/2015   Coronary artery disease involving native coronary artery of native heart with angina pectoris ([place]) 06/24/2015   Anemia due to stage 3 chronic kidney disease ([place]) 06/24/2015   Severe aortic stenosis 02/16/2013   Hyperlipidemia 02/16/2013   Essential hypertension, benign 02/16/2013   Coronary artery disease involving native coronary artery of native heart without angina pectoris    Multiple thyroid nodules 02/09/2012   Acute cough 08/25/2011   Breast cancer, right breast ([place]) 03/24/2008    Class: History of   Ductal carcinoma in situ of breast 06/23/2007    Class: History of   Past Medical History:  Past Medical History:  Diagnosis Date   Abnormal gait 06/24/2015   Last Assessment & Plan:   Relevant Hx:  Course:  Daily Update:  Today's Plan:uses her cane for this and no recent falls     Electronically signed by: Mayer Camel, NP  07/23/15 1529   Acute cough 08/25/2011   Acute kidney injury superimposed on chronic kidney disease ([place]) 10/18/2020   Anemia due to stage 3 chronic kidney disease ([place])  06/24/2015   Last Assessment & Plan: Formatting of this note might be different from the original. Relevant Hx: Course: Daily Update: Today's Plan:update her CMP for her Electronically signed by: Mayer Camel, NP 07/23/15 1528 Formatting of this note might be different from the original. Last Assessment & Plan: Relevant Hx: Course: Daily Update: Today's Plan:update her CMP for her Electronically    Anxiety 06/24/2015   Last Assessment & Plan:   Relevant Hx:  Course:  Daily Update:  Today's Plan:this is stable for her at this time     Electronically signed by: Mayer Camel, NP  07/23/15 1529   Aortic stenosis, severe    a. 07/2017: s/p TAVR w/ an Edwards Sapien 3 THV (size 26 mm, model # [id number], serial # [id number])   Atherosclerosis of coronary artery 06/24/2015   Formatting of this note might be different from the original. Ostial RCA DES, 2010 Last Assessment & Plan: stable Last Assessment & Plan: Relevant Hx: Course: Daily Update: Today's Plan:she has felt she was stable from this and she has cardiologist she has followed with . Electronically signed by: Mayer Camel, NP 07/23/15 1523 Ostial RCA DES, 2010 Last Assessment & Plan: stable   Atrial fibrillation ([place]) 03/19/2022   Bilateral hearing loss 06/25/2015   Bilateral sensorineural hearing loss 12/26/2020   Breast cancer, right breast ([place]) 03/24/2008   CAD (coronary artery disease)    a. 2010: s/p stent to RCA   Chronic diastolic (congestive) heart failure ([place]) 08/04/2017   Chronic kidney disease, stage III (moderate) ([facility])    Chronic recurrent major depressive disorder ([place]) 06/24/2015   Last Assessment & Plan:   Relevant Hx:  Course:  Daily Update:  Today's Plan:this is stable for her     Electronically signed by: Mayer Camel, NP  07/23/15 1530   Chronic sinusitis    Coronary artery disease involving native coronary artery of native heart with angina pectoris ([place]) 06/24/2015   Formatting of this note might be different from the original. Overview: Ostial RCA DES, 2010 Last Assessment & Plan: stable Last Assessment & Plan: Formatting of this note might be different from the original.  Relevant Hx: Course: Daily Update: Today's Plan:she has felt she was stable from this and she has cardiologist she has followed with . Electronically signed by: Mayer Camel, N   Coronary artery disease involving native coronary artery of native heart without angina pectoris    Ostial RCA DES, 2010   Decreased hearing of both ears 06/25/2015   Deficiency anemia 03/11/2021   Dehydration 10/19/2020   Depressive disorder    Dizziness 08/14/2018   Drusen of macula of both eyes 09/12/2018   Ductal carcinoma in situ of breast 06/23/2007   Encounter for long-term current use of high risk medication 06/24/2015   Epiretinal membrane (ERM) of left eye 09/12/2018   Essential hypertension, benign 02/16/2013   Gastro-esophageal reflux disease with esophagitis 06/24/2015   Last Assessment & Plan:   Relevant Hx:  Course:  Daily Update:  Today's Plan:she is stable from this      Electronically signed by: Mayer Camel, NP  07/23/15 1526   Gastroesophageal reflux disease without esophagitis 12/27/2016   History of breast cancer    a. s/p lumpectomies and XRT   Hyperlipemia    Hyperlipidemia 02/16/2013   Hypertension    Hypothyroid    Hypothyroidism, acquired 06/24/2015   Last Assessment & Plan:   Relevant Hx:  Course:  Daily Update:  Today's Plan:update her TSH for her today     Electronically signed by: Mayer Camel, NP  07/23/15 1526   Incisional hernia, without obstruction or gangrene 02/24/2019   Insomnia    Intermediate stage nonexudative age-related macular degeneration of both eyes 01/19/2021   Irritation of oral cavity 11/23/2019   Malaise and fatigue 06/24/2015   Last Assessment & Plan:   Relevant Hx:  Course:  Daily Update:  Today's Plan:she is up and down with her energy and she feels that for the most part she has been stable     Electronically signed by: Mayer Camel, NP  07/23/15 1533   Malignant neoplasm of central portion of left  female breast ([place]) 06/24/2015   Last Assessment & Plan:   Relevant Hx:  Course:  Daily Update:  Today's Plan:she is now receiving injections once a month for this and is hopeful will keep this at bay as this has been recurrent     Electronically signed by: Mayer Camel, NP  07/23/15 1530   Melena 03/19/2022   Memory change 07/23/2015   Last Assessment & Plan:   Relevant Hx:  Course:  Daily Update:  Today's Plan:she is going to take the aricept at supper     Electronically signed by: Mayer Camel, NP  07/23/15 1532   Memory loss    Multiple thyroid nodules 02/09/2012   Osteoarthritis    Osteoporosis 03/24/2020   Other allergic rhinitis 12/27/2016   Plantar fat pad atrophy 01/19/2016   Posterior vitreous detachment of left eye 09/12/2018   Primary insomnia 06/24/2015   Last Assessment & Plan:   Relevant Hx:  Course:  Daily Update:  Today's Plan:she feels that this is stable for her at this time     Electronically signed by: Mayer Camel, NP  07/23/15 1531   Primary localized osteoarthrosis, ankle and foot 01/15/2016   Primary osteoarthritis involving multiple joints 06/24/2015   Last Assessment & Plan:   Relevant Hx:  Course:  Daily Update:  Today's Plan:she is stable from her joint though she has discomfort if she overexerts.     Electronically signed by: Mayer Camel, NP  07/23/15 1528   Reflux esophagitis    S/P TAVR (transcatheter aortic valve replacement) 08/02/2017   Severe aortic stenosis 02/16/2013   Physician Review  Conclusions: 1. Mild concentric left ventricular hypertrophy.  2. Left ventricular ejection fraction estimated by 2D at 60-65 percent.  3. There were no regional wall motion abnormalities.  4. Mild mitral annular calcification.  5. Trace mitral valve regurgitation.  6. Trivial tricuspid regurgitation.  7. Moderate increased thickness and calcification of the trileaflet aortic val   Stage III chronic kidney disease ([place])  06/24/2015   Last Assessment & Plan:   Relevant Hx:  Course:  Daily Update:  Today's Plan:update her CMP for her     Electronically signed by: Mayer Camel, NP  07/23/15 1528   Thyroid nodule 06/24/2015   Vitreous membranes and strands 01/19/2021   PPV on 02/11/2021 OS   Past Surgical History:  Past Surgical History:  Procedure Laterality Date   ANGIOPLASTY     BIOPSY  10/21/2020   Procedure: BIOPSY;  Surgeon: Carol Ada, MD;  Location: [facility];  Service: Endoscopy;;   BREAST LUMPECTOMY     x2   ESOPHAGOGASTRODUODENOSCOPY Left 10/21/2020   Procedure: ESOPHAGOGASTRODUODENOSCOPY (EGD);  Surgeon: Carol Ada, MD;  Location: [facility];  Service: Endoscopy;  Laterality: Left;  Abnormal barium swallow  FOOT TENDON SURGERY     GALLBLADDER SURGERY     INCISIONAL HERNIA REPAIR N/A 03/01/2019   Procedure: OPEN INCISIONAL HERNIA REPAIR;  Surgeon: Armandina Gemma, MD;  Location: [facility];  Service: General;  Laterality: N/A;   INSERTION OF MESH N/A 03/01/2019   Procedure: INSERTION OF MESH;  Surgeon: Armandina Gemma, MD;  Location: [facility];  Service: General;  Laterality: N/A;   INTRAOPERATIVE TRANSTHORACIC ECHOCARDIOGRAM N/A 08/02/2017   Procedure: INTRAOPERATIVE TRANSTHORACIC ECHOCARDIOGRAM;  Surgeon: Burnell Blanks, MD;  Location: [facility];  Service: Open Heart Surgery;  Laterality: N/A;   RADIOLOGY WITH ANESTHESIA N/A 04/16/2022   Procedure: IR WITH ANESTHESIA;  Surgeon: Luanne Bras, MD;  Location: [facility];  Service: Radiology;  Laterality: N/A;   REPLACEMENT TOTAL KNEE BILATERAL     RIGHT/LEFT HEART CATH AND CORONARY ANGIOGRAPHY N/A 07/05/2017   Procedure: RIGHT/LEFT HEART CATH AND CORONARY ANGIOGRAPHY;  Surgeon: Belva Crome, MD;  Location: [facility];  Service: Cardiovascular;  Laterality: N/A;   SKIN CANCER EXCISION  2018   right nostril    TOTAL ABDOMINAL HYSTERECTOMY     TRANSCATHETER AORTIC VALVE REPLACEMENT, TRANSFEMORAL N/A 08/02/2017   Procedure:  TRANSCATHETER AORTIC VALVE REPLACEMENT, TRANSFEMORAL;  Surgeon: Burnell Blanks, MD;  Location: [facility];  Service: Open Heart Surgery;  Laterality: N/A;   HPI:  Pt is a 89 y.o. female who presented secondary to chest pain, dysarthria, left-sided weakness, right gaze deviation and left-sided neglect. TNK given. 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 Pt s/p mechanical thrombectomy with successful recanalization (TICI 3) 2/3; pt left intubated post procedure, but extubated later that day. Pt failed the [facility] twice since she stopped drinking. PMH: atrial fibrillation not on anticoagulation, aortic stenosis status post TAVR, CAD, CKD stage III, depression, breast cancer, GERD, hyperlipidemia, hypertension, hypothyroidism and arthritis.   Assessment / Plan / Recommendation Clinical Impression  Pt participated in speech-language-cognition evaluation with her son and daughter-in-law present. Pt reported that that she lives alone, that her son cooks her meals and manages her bills, and that her daughter manages her medications. Pt's son stated that the pt orders most of her meals from the dining hall. Pt reported that she has some difficulty with memory at baseline. Pt's family added that there have also been impairments in attention and word retrieval, but that cognitive appear to be worse. All parties agreed that the pt's processing speed and performance were ~60% back to her baseline. The [facility] Mental Status Examination was started, but then discontinued due to pt's report of increasing anxiety regarding "testing" and her progressively reduced engagement. Pt was therefore evaluated informally and she exhibited difficulty in the areas of attention, orientation, memory, and executive function. Vocal intensity was reduced, but speech was reportedly at baseline. Pt exhibited linguistic  difficulty related to auditory comprehension of complex information and word retrieval during structured naming tasks. Pt's word retrieval difficulty may be her baseline and her auditory comprehension was likely negatively impacted by her impairments in attention and memory. Skilled SLP services are clinically indicated at this time.    SLP Assessment  SLP Recommendation/Assessment: Patient needs continued Speech [place] [facility] SLP Visit Diagnosis: Cognitive communication deficit (R41.841)    Recommendations for follow up therapy are one component of a multi-disciplinary discharge planning process, led by the attending physician.  Recommendations may be updated based on patient status, additional functional criteria  and insurance authorization.    Follow Up Recommendations   (Continued SLP services at level of care recommended by PT/OT)    Assistance Recommended at Discharge  Frequent or constant Supervision/Assistance  Functional Status Assessment Patient has had a recent decline in their functional status and demonstrates the ability to make significant improvements in function in a reasonable and predictable amount of time.  Frequency and Duration min 2x/week  2 weeks      SLP Evaluation Cognition  Overall Cognitive Status: Impaired/Different from baseline Arousal/Alertness: Awake/alert Orientation Level: Oriented to person;Oriented to place;Oriented to situation;Disoriented to time Year:  (1918) Month: February Day of Week: Incorrect Attention: Focused;Sustained Focused Attention: Appears intact Sustained Attention: Impaired Sustained Attention Impairment: Verbal basic Memory: Impaired Memory Impairment: Storage deficit;Decreased short term memory Decreased Short Term Memory: Verbal basic Awareness: Impaired Awareness Impairment: Emergent impairment Problem Solving: Impaired Problem Solving Impairment: Verbal complex Executive Function: Reasoning Reasoning:  Impaired       Comprehension  Auditory Comprehension Overall Auditory Comprehension: Impaired Yes/No Questions: Impaired Basic Immediate Environment Questions:  (3/5) Complex Questions:  (3/5) Commands: Impaired One Step Basic Commands:  (4/5) Two Step Basic Commands:  (3/5) Conversation: Simple    Expression Expression Primary Mode of Expression: Verbal Verbal Expression Overall Verbal Expression: Impaired Initiation: Impaired Automatic Speech: Month of year (12/12; but perseveration on task noted an pt repeated sequence 3 times prior to being stopped) Level of Generative/Spontaneous Verbalization: Conversation Naming: Impairment Responsive:  (2/5) Divergent:  (4 in one minute)   Oral / Motor  Oral Motor/Sensory Function Overall Oral Motor/Sensory Function: Within functional limits Motor Speech Overall Motor Speech: Appears within functional limits for tasks assessed Respiration: Within functional limits Phonation: Low vocal intensity;Hoarse Resonance: Within functional limits Articulation: Within functional limitis Intelligibility: Intelligible           Sadonna Kotara I. Hardin Negus, Fernando Salinas, [place] Office number [place] 04/18/2022, 10:13 AM

## 2022-04-18 NOTE — Progress Notes (Signed)
STROKE TEAM PROGRESS NOTE   INTERVAL HISTORY Her daughter and son are at the bedside.    Extubated yesterday.  Doing well. No complaints. Not sleeping well at night despite buspar and klonopin addition. Off drips. Passed swallow and breakfast ordered.   Vitals:   04/18/22 0700 04/18/22 0800 04/18/22 0900 04/18/22 1000  BP: 100/64 (!) 122/54 107/66 118/75  Pulse: (!) 106 95 (!) 113 (!) 105  Resp: (!) 25 (!) 24 (!) 21 (!) 23  Temp:  97.8 F (36.6 C)    TempSrc:  Axillary    SpO2: 97% 97% 94% 99%  Weight:      Height:       CBC:  Recent Labs  Lab 04/17/22 0554 04/18/22 0921  WBC 10.6* 9.9  NEUTROABS 9.7* 8.2*  HGB 11.1* 9.8*  HCT 33.9* 29.8*  MCV 85.2 85.9  PLT 307 458    Basic Metabolic Panel:  Recent Labs  Lab 04/16/22 1916 04/16/22 2046 04/17/22 0554 04/18/22 0921  NA  --    < > 135 135  K  --    < > 4.2 3.6  CL  --   --  103 104  CO2  --   --  22 22  GLUCOSE  --   --  167* 141*  BUN  --   --  16 16  CREATININE  --   --  1.27* 1.18*  CALCIUM  --   --  9.1 9.1  MG 1.7  --   --   --    < > = values in this interval not displayed.    Lipid Panel:  Recent Labs  Lab 04/17/22 0554 04/18/22 0441  CHOL 210*  --   TRIG 99 160*  HDL 55  --   CHOLHDL 3.8  --   VLDL 20  --   LDLCALC 135*  --     HgbA1c:  Recent Labs  Lab 04/17/22 0554  HGBA1C 4.6*    Urine Drug Screen: No results for input(s): "LABOPIA", "COCAINSCRNUR", "LABBENZ", "AMPHETMU", "THCU", "LABBARB" in the last 168 hours.  Alcohol Level  Recent Labs  Lab 04/16/22 1616  ETH <10     IMAGING past 24 hours MR BRAIN WO CONTRAST  Result Date: 04/17/2022 CLINICAL DATA:  Stroke follow-up EXAM: MRI HEAD WITHOUT CONTRAST TECHNIQUE: Multiplanar, multiecho pulse sequences of the brain and surrounding structures were obtained without intravenous contrast. COMPARISON:  CT Head/angio 04/16/22 FINDINGS: Brain: There are acute infarcts in the anterior right temporal lobe (series 2, image 21) and in the  body of the corpus callosum (series 2, image 32). There is also an additional small region of acute infarct in the right corona radiata (series 2, image 31). There is a separate region of late acute to early subacute infarct in the left centrum semiovale (series 2, image 36). Sequela of severe chronic microvascular ischemic change with chronic infarcts in the right frontal lobe in the left cerebellum. There are scattered regions of susceptibility artifact on susceptibility weighted imaging, for example abutting the left occipital horn (series 7, image 48) in the centrum semiovale on the right (series 7, image 68) which appear dark on all of the obtained sequences. These may represent sites of microhemorrhages or small volume pneumocephalus. Vascular: Normal flow voids. Skull and upper cervical spine: Normal marrow signal. Sinuses/Orbits: Bilateral lens replacement. No mastoid effusion. Paranasal sinuses are notable for mucosal thickening in the left sphenoid sinus Other: None IMPRESSION: 1. Acute infarcts in the anterior right temporal  lobe, body of the corpus callosum, right corona radiata, and left centrum semiovale. 2. Scattered regions of susceptibility artifact appear dark on all of the obtained sequences, which may represent sites of microhemorrhages or small volume pneumocephalus. No parenchymal hematoma. Consider further evalution with a CT of the head for further evaluation. These results will be called to the ordering clinician or representative by the Radiologist Assistant, and communication documented in the PACS or [employer]. Electronically Signed   By: Marin Roberts M.D.   On: 04/17/2022 16:50    PHYSICAL EXAM  Constitutional: NAD. Laying in bed. Later in am ,sitting up in chair.  HEENT: [name], AT Respiratory: normal. Skin: WDI   Neuro: Mental Status: Follows commands.   Cranial Nerves: Alert, awake. Oriented to person, place but not year. Identified her children. PERRL. EOMI. No  visual field cut. No droop. Tongue midline.  Motor: no drift in right leg and b/l arms. Slight drift in left leg.  Sensory: normal. Cerebellar: no ataxia.   Gait: not tested.    ASSESSMENT/PLAN [name] is a 89 y.o. female with history of atrial fibrillation not on anticoagulation, aortic stenosis status post TAVR, CAD, CKD stage III, depression, breast cancer, hyperlipidemia, hypertension, hypothyroidism and arthritis who was last seen well by her caregiver at 1500.  Patient had gone to the bathroom and then began to complain of chest pain.  EMS was called, and when EMS arrived, patient developed dysarthria, left-sided weakness, right gaze deviation and left-sided neglect.  Head CT in emergency department showed no acute abnormalities with aspects of 10.  On CTA, right M1 occlusion was noted.  Discussion was had with patient's son, and consent was obtained for TNK administration and mechanical thrombectomy.  Patient was made a DNR per her previously stated wishes as verbalized by her son.  TNK was administered at 1618 with a short delay due to blood pressure being out of range, but this resolved with administration of labetalol. Patient was started on Cleviprex as her blood pressure began to rise after TNK administration.   LKW: 1500 TNK given?:  Yes, at 1618 after discussion with patient's son, short delay due to blood pressure being out of range IR Thrombectomy?  Yes, patient taken to IR for thrombectomy of right M1 at 1630 Modified Rankin Scale: 3-Moderate disability-requires help but walks WITHOUT assistance  Acute ischemic stroke due to right MCA M1 occlusion in patient with atrial fibrillation not on anticoagulation   Code Stroke CT head: No acute intracranial process. 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 ASPECTS is 10. Repeat CT head post-24hours: CTA head & neck: Occlusion of the distal right M1. 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 Severe stenosis  in the mid left V4, moderate stenosis in the mid to distal right V4, and mild stenosis in the distal left ACA. 70% stenosis in the proximal left subclavian artery and 80% stenosis in the proximal right subclavian artery. No other hemodynamically significant stenosis in the neck Post IR CT: Negative hemorrhage MRI scattered right MCA infarcts with chronic microhemorrages noted.   2D Echo: LVEF 45-50%, Global Hypokinesis, Severe Concentric LVH Mildly reduced RVSF Small Pericardial Effusion Present Trivial MVR  Atrial Fibrillation LDL 135 HgbA1c 4.6 VTE prophylaxis - SCDs    Diet   Diet regular Room service appropriate? Yes with Assist; Fluid consistency: Thin   No antithrombotic prior to admission, no anti-thrombotic until 24hours post-TNK.  Therapy recommendations:  pending Disposition:  pending  Hypertension Home meds:  norvasc, clonidine; on hold SBP goal 120-140 for first 24 hours post-thrombectomy Long-term BP goal normotensive  Hyperlipidemia Home meds:  lipitor '10mg'$  LDL 135, goal < 70 Increased to high-intensity, Lipitor '80mg'$  Continue statin at discharge   Other Stroke Risk Factors Advanced Age >/= 6  Family hx stroke (Mother) Coronary artery disease  Other Active Problems Recent COVID Positive December 2023 Severe AS s/p TAVR AKI on CKD stage III Hypothyroidism  Hospital day # 2   ATTENDING ATTESTATION:  89 year old with history of atrial fibrillation not on anticoagulation with right MCA stroke status post TNK and thrombectomy of right MCA.  Brain MRI shows right temporal lobe, corpus callosum, right corona radiata and left centrum semi ovale likely cardio embolic. Metoprolol added today for rate control. Transfer out of the ICU later today. Goal sBP <160. Will start eliquis, discussed with son and daughter in law. They are in agreement but wanted to talk with other family. They are aware of risk of bleeding with eliquis but risk out weight the benefits.    Appreciate CCM assistance.   This patient is critically ill due to stroke s/p tPA and IR and at significant risk of neurological worsening, death form heart failure, respiratory failure, recurrent stroke, bleeding from [facility], seizure, sepsis. This patient's care requires constant monitoring of vital signs, hemodynamics, respiratory and cardiac monitoring, review of multiple databases, neurological assessment, discussion with family, other specialists and medical decision making of high complexity. I spent 35 minutes of neurocritical care time in the care of this patient.   Kelse Ploch,MD    To contact Stroke Continuity provider, please refer to [email or web address] After hours, contact General Neurology

## 2022-04-18 NOTE — Progress Notes (Addendum)
NAME:  [name], MRN:  [id number], DOB:  Sep 22, 1925, LOS: 2 ADMISSION DATE:  04/16/2022 CONSULTATION DATE:  04/16/2022 REFERRING MD:  Genevie Cheshire CHIEF COMPLAINT:  R MCA occlusion   History of Present Illness:  89 year old woman who presented to [facility] on 2/2 as a Code Stroke. LKW 1500; c/o CP and EMS was called. On EMS arrival patient had dysarthria and L-sided weakness/neglect with R gaze deviation. Brought to [facility] for further evaluation. PMHx significant for HTN, HLD, CAD (s/p DES to RCA 2010), AS s/p TAVR (2019), chronic diastolic HF (Echo 1448 EF 50-55%, mod LVH), Afib (not on AC), CKD stage III, hypothyroidism, breast CA, GERD, depression/anxiety.  On ED arrival, Code Stroke initiated. CT Head demonstrated NAICA; CTA Head/Neck positive for R MCA M1 occlusion. TNK was administered 1618 (slight delay due to elevated BP). Cleviprex initiated. Taken to [facility] for mechanical thrombectomy (Dr. Estanislado Pandy) with successful recanalization (TICI 3). Post-procedure CT Head negative for ICH. Of note, manual pressure/QuickClot needed to R groin access site due to Angio-Seal closure device failure. Patient incidentally COVID positive and left intubated post-procedure.  PCCM consulted for management of MV/hemodynamics.  Pertinent Medical History:   Past Medical History:  Diagnosis Date   Abnormal gait 06/24/2015   Last Assessment & Plan:   Relevant Hx:  Course:  Daily Update:  Today's Plan:uses her cane for this and no recent falls     Electronically signed by: Mayer Camel, NP  07/23/15 1529   Acute cough 08/25/2011   Acute kidney injury superimposed on chronic kidney disease ([place]) 10/18/2020   Anemia due to stage 3 chronic kidney disease ([place]) 06/24/2015   Last Assessment & Plan: Formatting of this note might be different from the original. Relevant Hx: Course: Daily Update: Today's Plan:update her CMP for her Electronically signed by: Mayer Camel, NP 07/23/15 1528  Formatting of this note might be different from the original. Last Assessment & Plan: Relevant Hx: Course: Daily Update: Today's Plan:update her CMP for her Electronically    Anxiety 06/24/2015   Last Assessment & Plan:   Relevant Hx:  Course:  Daily Update:  Today's Plan:this is stable for her at this time     Electronically signed by: Mayer Camel, NP  07/23/15 1529   Aortic stenosis, severe    a. 07/2017: s/p TAVR w/ an Edwards Sapien 3 THV (size 26 mm, model # [id number], serial # [id number])   Atherosclerosis of coronary artery 06/24/2015   Formatting of this note might be different from the original. Ostial RCA DES, 2010 Last Assessment & Plan: stable Last Assessment & Plan: Relevant Hx: Course: Daily Update: Today's Plan:she has felt she was stable from this and she has cardiologist she has followed with . Electronically signed by: Mayer Camel, NP 07/23/15 1523 Ostial RCA DES, 2010 Last Assessment & Plan: stable   Atrial fibrillation ([place]) 03/19/2022   Bilateral hearing loss 06/25/2015   Bilateral sensorineural hearing loss 12/26/2020   Breast cancer, right breast ([place]) 03/24/2008   CAD (coronary artery disease)    a. 2010: s/p stent to RCA   Chronic diastolic (congestive) heart failure ([place]) 08/04/2017   Chronic kidney disease, stage III (moderate) (H[facility])    Chronic recurrent major depressive disorder ([place]) 06/24/2015   Last Assessment & Plan:   Relevant Hx:  Course:  Daily Update:  Today's Plan:this is stable for her     Electronically signed by: Mayer Camel, NP  07/23/15 1530   Chronic  sinusitis    Coronary artery disease involving native coronary artery of native heart with angina pectoris ([place]) 06/24/2015   Formatting of this note might be different from the original. Overview: Ostial RCA DES, 2010 Last Assessment & Plan: stable Last Assessment & Plan: Formatting of this note might be different from the original. Relevant Hx: Course: Daily Update:  Today's Plan:she has felt she was stable from this and she has cardiologist she has followed with . Electronically signed by: Mayer Camel, N   Coronary artery disease involving native coronary artery of native heart without angina pectoris    Ostial RCA DES, 2010   Decreased hearing of both ears 06/25/2015   Deficiency anemia 03/11/2021   Dehydration 10/19/2020   Depressive disorder    Dizziness 08/14/2018   [place] of macula of both eyes 09/12/2018   Ductal carcinoma in situ of breast 06/23/2007   Encounter for long-term current use of high risk medication 06/24/2015   Epiretinal membrane (ERM) of left eye 09/12/2018   Essential hypertension, benign 02/16/2013   Gastro-esophageal reflux disease with esophagitis 06/24/2015   Last Assessment & Plan:   Relevant Hx:  Course:  Daily Update:  Today's Plan:she is stable from this      Electronically signed by: Mayer Camel, NP  07/23/15 1526   Gastroesophageal reflux disease without esophagitis 12/27/2016   History of breast cancer    a. s/p lumpectomies and XRT   Hyperlipemia    Hyperlipidemia 02/16/2013   Hypertension    Hypothyroid    Hypothyroidism, acquired 06/24/2015   Last Assessment & Plan:   Relevant Hx:  Course:  Daily Update:  Today's Plan:update her TSH for her today     Electronically signed by: Mayer Camel, NP  07/23/15 1526   Incisional hernia, without obstruction or gangrene 02/24/2019   Insomnia    Intermediate stage nonexudative age-related macular degeneration of both eyes 01/19/2021   Irritation of oral cavity 11/23/2019   Malaise and fatigue 06/24/2015   Last Assessment & Plan:   Relevant Hx:  Course:  Daily Update:  Today's Plan:she is up and down with her energy and she feels that for the most part she has been stable     Electronically signed by: Mayer Camel, NP  07/23/15 1533   Malignant neoplasm of central portion of left female breast ([place]) 06/24/2015   Last  Assessment & Plan:   Relevant Hx:  Course:  Daily Update:  Today's Plan:she is now receiving injections once a month for this and is hopeful will keep this at bay as this has been recurrent     Electronically signed by: Mayer Camel, NP  07/23/15 1530   Melena 03/19/2022   Memory change 07/23/2015   Last Assessment & Plan:   Relevant Hx:  Course:  Daily Update:  Today's Plan:she is going to take the aricept at supper     Electronically signed by: Mayer Camel, NP  07/23/15 1532   Memory loss    Multiple thyroid nodules 02/09/2012   Osteoarthritis    Osteoporosis 03/24/2020   Other allergic rhinitis 12/27/2016   Plantar fat pad atrophy 01/19/2016   Posterior vitreous detachment of left eye 09/12/2018   Primary insomnia 06/24/2015   Last Assessment & Plan:   Relevant Hx:  Course:  Daily Update:  Today's Plan:she feels that this is stable for her at this time     Electronically signed by: Mayer Camel, NP  07/23/15 1531  Primary localized osteoarthrosis, ankle and foot 01/15/2016   Primary osteoarthritis involving multiple joints 06/24/2015   Last Assessment & Plan:   Relevant Hx:  Course:  Daily Update:  Today's Plan:she is stable from her joint though she has discomfort if she overexerts.     Electronically signed by: Mayer Camel, NP  07/23/15 1528   Reflux esophagitis    S/P TAVR (transcatheter aortic valve replacement) 08/02/2017   Severe aortic stenosis 02/16/2013   Physician Review  Conclusions: 1. Mild concentric left ventricular hypertrophy.  2. Left ventricular ejection fraction estimated by 2D at 60-65 percent.  3. There were no regional wall motion abnormalities.  4. Mild mitral annular calcification.  5. Trace mitral valve regurgitation.  6. Trivial tricuspid regurgitation.  7. Moderate increased thickness and calcification of the trileaflet aortic val   Stage III chronic kidney disease ([place]) 06/24/2015   Last Assessment & Plan:    Relevant Hx:  Course:  Daily Update:  Today's Plan:update her CMP for her     Electronically signed by: Mayer Camel, NP  07/23/15 1528   Thyroid nodule 06/24/2015   Vitreous membranes and strands 01/19/2021   PPV on 02/11/2021 OS   Significant Hospital Events: Including procedures, antibiotic start and stop dates in addition to other pertinent events   2/2 - Presented to [facility] ED via EMS for CP, then L-sided weakness/neglect, R gaze deviation and dysarthria. LKW 1500. CT Head negative, CTA Head/Neck +R MCA M1 occlusion. TNK given 1618. Taken to [facility] for mechanical thrombectomy with recanalization of vessel (TICI 3). COVID+, left intubated. PCCM consulted for MV. 2/3 extubated  Interim History / Subjective:   No acute events overnight  Extubated yesterday  Objective:  Blood pressure 100/64, pulse (!) 106, temperature 98.3 F (36.8 C), temperature source Axillary, resp. rate (!) 25, height '4\' 11"'$  (1.499 m), weight 64.6 kg, SpO2 97 %.    Vent Mode: PSV;CPAP FiO2 (%):  [40 %] 40 % PEEP:  [5 cmH20] 5 cmH20 Pressure Support:  [5 cmH20] 5 cmH20   Intake/Output Summary (Last 24 hours) at 04/18/2022 2297 Last data filed at 04/18/2022 0700 Gross per 24 hour  Intake 1220.25 ml  Output 950 ml  Net 270.25 ml   Filed Weights   04/16/22 1600  Weight: 64.6 kg   Physical Examination: Normal vent mechanics Ext warm Lungs clear Heart sounds regular Groin access site looks okay L sided weakness  Cr better WBC up slightly  Resolved Hospital Problem List:    Assessment & Plan:  Right MCA M1 occlusion- S/p mechanical thrombectomy, TNK Post-procedure R groin hematoma s/p Angio-Seal closure device; stable Periprocedural respiratory insufficiency requiring mechanical ventilation COVID-19 +: cycle time 41, noninfectious/ incidental Atrial fibrillation Severe AS s/p TAVR HTN HLD AKI on CKD stage III Hypothyroidism  - GDMT and AC resumption per primary - Rehab, SLP etc -  resume home metoprolol but at reduced dose 12.'5mg'$  BID, may increase if HR not well controlled to '25mg'$  BID - work on weaning off cleviprex drip  Best Practice: (right click and "Reselect all SmartList Selections" daily)   Diet/type: NPO, awaiting swallow eval DVT prophylaxis: SCDs, AC per Neuro/NIR GI prophylaxis: PPI Lines: N/A Foley:  N/A Code Status:  DNR Last date of multidisciplinary goals of care discussion [Per Primary Team]  35 min cc time  Freda Jackson, MD [facility] Office: [phone]   See Amion for personal pager PCCM on call pager [phone] until 7pm. Please call Elink 7p-7a.  [phone]  

## 2022-04-18 NOTE — Progress Notes (Signed)
Referring Physician(s): Dr Curly Shores  Supervising Physician: Luanne Bras  Patient Status:  [facility] - In-pt  Chief Complaint:  RMCA revascularization  Subjective:  IR procedure 2/2: Status post complex revascularization of occluded right middle cerebral artery distal M1 segment with 1 pass with contact aspiration and proximal flow arrest achieving aTICI 3 revascularization. Post CT of the brain.NO ICH  Doing great Up in bed; eating well Answers all questions Moving all 4s Follows all commands   Allergies: Latex, Lisinopril, Povidone iodine, and Povidone-iodine  Medications: Prior to Admission medications   Medication Sig Start Date End Date Taking? Authorizing Provider  albuterol (VENTOLIN HFA) 108 (90 Base) MCG/ACT inhaler Inhale 2 puffs into the lungs every 4 (four) hours as needed for wheezing or shortness of breath. 04/09/22  Yes Nona Dell, Corene Cornea, MD  amLODipine (NORVASC) 2.5 MG tablet Take 2.5 mg by mouth 2 (two) times daily. 07/14/21  Yes [provider]  buPROPion (WELLBUTRIN XL) 300 MG 24 hr tablet Take 300 mg by mouth every morning. 07/29/21  Yes [provider]  busPIRone (BUSPAR) 5 MG tablet Take 1 tablet (5 mg total) by mouth 3 (three) times daily as needed (anxiety). 03/09/22  Yes Townsend Roger, MD  cloNIDine (CATAPRES) 0.1 MG tablet Take 0.1 mg by mouth 2 (two) times daily. 03/18/20  Yes [provider]  diclofenac (VOLTAREN) 25 MG EC tablet Take 1 tablet (25 mg total) by mouth 2 (two) times daily as needed for moderate pain. 03/09/22  Yes Townsend Roger, MD  diphenoxylate-atropine (LOMOTIL) 2.5-0.025 MG tablet Take 1 tablet by mouth 4 (four) times daily as needed for diarrhea or loose stools. 10/21/20  Yes Barb Merino, MD  Eszopiclone 3 MG TABS Take 3 mg by mouth at bedtime. 02/16/22  Yes [provider]  FEROSUL 325 (65 Fe) MG tablet Take 325 mg by mouth 2 (two) times daily. 03/03/22  Yes [provider]  fluticasone  (FLONASE) 50 MCG/ACT nasal spray Place 1 spray into both nostrils in the morning and at bedtime. 02/26/22 02/26/23 Yes Townsend Roger, MD  furosemide (LASIX) 20 MG tablet Take 1 tablet (20 mg total) by mouth 2 (two) times daily. 04/09/22  Yes Townsend Roger, MD  Magnesium Oxide 500 MG CAPS Take 500 mg by mouth daily. 10/21/20  Yes [provider]  metoprolol succinate (TOPROL-XL) 25 MG 24 hr tablet Take 25 mg by mouth 2 (two) times daily. 03/03/22  Yes [provider]  Probiotic Product (ALIGN PO) Take 1 capsule by mouth daily.   Yes [provider]  doxycycline (MONODOX) 100 MG capsule Take 1 capsule (100 mg total) by mouth 2 (two) times daily. Patient not taking: Reported on 04/17/2022 02/26/22   Townsend Roger, MD  levothyroxine (SYNTHROID, LEVOTHROID) 50 MCG tablet Take 50 mcg by mouth daily before breakfast.  Patient not taking: Reported on 04/17/2022 12/09/16   [provider]     Vital Signs: BP (!) 122/54 (BP Location: Right Arm)   Pulse 95   Temp 97.8 F (36.6 C) (Axillary)   Resp (!) 24   Ht '4\' 11"'$  (1.499 m)   Wt 142 lb 6.7 oz (64.6 kg)   SpO2 97%   BMI 28.76 kg/m   Physical Exam Musculoskeletal:        General: Normal range of motion.     Comments: Moving all 4s Great strength and sensation Smile = Faces symmetrical   Skin:    Comments: Rt groin NT no  bleeding; no hematoma     Imaging: MR BRAIN WO CONTRAST  Result Date: 04/17/2022 CLINICAL DATA:  Stroke follow-up EXAM: MRI HEAD WITHOUT CONTRAST TECHNIQUE: Multiplanar, multiecho pulse sequences of the brain and surrounding structures were obtained without intravenous contrast. COMPARISON:  CT Head/angio 04/16/22 FINDINGS: Brain: There are acute infarcts in the anterior right temporal lobe (series 2, image 21) and in the body of the corpus callosum (series 2, image 32). There is also an additional small region of acute infarct in the right corona radiata (series 2, image 31). There is a  separate region of late acute to early subacute infarct in the left centrum semiovale (series 2, image 36). Sequela of severe chronic microvascular ischemic change with chronic infarcts in the right frontal lobe in the left cerebellum. There are scattered regions of susceptibility artifact on susceptibility weighted imaging, for example abutting the left occipital horn (series 7, image 48) in the centrum semiovale on the right (series 7, image 68) which appear dark on all of the obtained sequences. These may represent sites of microhemorrhages or small volume pneumocephalus. Vascular: Normal flow voids. Skull and upper cervical spine: Normal marrow signal. Sinuses/Orbits: Bilateral lens replacement. No mastoid effusion. Paranasal sinuses are notable for mucosal thickening in the left sphenoid sinus Other: None IMPRESSION: 1. Acute infarcts in the anterior right temporal lobe, body of the corpus callosum, right corona radiata, and left centrum semiovale. 2. Scattered regions of susceptibility artifact appear dark on all of the obtained sequences, which may represent sites of microhemorrhages or small volume pneumocephalus. No parenchymal hematoma. Consider further evalution with a CT of the head for further evaluation. These results will be called to the ordering clinician or representative by the Radiologist Assistant, and communication documented in the PACS or [employer]. Electronically Signed   By: Marin Roberts M.D.   On: 04/17/2022 16:50   ECHOCARDIOGRAM COMPLETE  Result Date: 04/17/2022    ECHOCARDIOGRAM REPORT   Patient Name:   [name] [name] Araiza Date of Exam: 04/17/2022 Medical Rec #:  [id number]                 Height:       59.0 in Accession #:    [id number]                Weight:       142.4 lb Date of Birth:  12/19/1925                  BSA:          1.597 m Patient Age:    89 years                  BP:           121/95 mmHg Patient Gender: F                         HR:           110 bpm.  Exam Location:  Inpatient Procedure: 2D Echo, Cardiac Doppler and Color Doppler Indications:    I63.9 Stroke  History:        Patient has prior history of Echocardiogram examinations, most                 recent 03/02/2022. CKD and Stroke, Arrythmias:Atrial                 Fibrillation; Risk Factors:Hypertension and Dyslipidemia.  Sonographer:  Wilkie Aye RVT RCS Referring Phys: [id number] [place]  Sonographer Comments: Echo performed with patient supine and on artificial respirator. IMPRESSIONS  1. Consider infiltrative cardiomyopathy such as amyloid. Left ventricular ejection fraction, by estimation, is 45 to 50%. Left ventricular ejection fraction by PLAX is 47 %. The left ventricle has mildly decreased function. The left ventricle demonstrates global hypokinesis. There is severe concentric left ventricular hypertrophy. Left ventricular diastolic function could not be evaluated.  2. Right ventricular systolic function is mildly reduced. The right ventricular size is normal. There is normal pulmonary artery systolic pressure. The estimated right ventricular systolic pressure is 45.4 mmHg.  3. Left atrial size was moderately dilated.  4. Right atrial size was moderately dilated.  5. A small pericardial effusion is present. The pericardial effusion is circumferential. There is no evidence of cardiac tamponade.  6. The mitral valve is abnormal. Trivial mitral valve regurgitation. Moderate mitral annular calcification.  7. The aortic valve is tricuspid. Aortic valve regurgitation is not visualized. No aortic stenosis is present.  8. The inferior vena cava is normal in size with greater than 50% respiratory variability, suggesting right atrial pressure of 3 mmHg.  9. Rhythm strip during this exam demonstrates atrial fibrillation. Comparison(s): Prior images unable to be directly viewed, comparison made by report only. Changes from prior study are noted. 03/02/2022: [facility]) - LVEF 55-60%.  FINDINGS  Left Ventricle: Consider infiltrative cardiomyopathy such as amyloid. Left ventricular ejection fraction, by estimation, is 45 to 50%. Left ventricular ejection fraction by PLAX is 47 %. The left ventricle has mildly decreased function. The left ventricle demonstrates global hypokinesis. The left ventricular internal cavity size was normal in size. There is severe concentric left ventricular hypertrophy. Left ventricular diastolic function could not be evaluated due to atrial fibrillation. Left ventricular diastolic function could not be evaluated. Right Ventricle: The right ventricular size is normal. No increase in right ventricular wall thickness. Right ventricular systolic function is mildly reduced. There is normal pulmonary artery systolic pressure. The tricuspid regurgitant velocity is 2.02 m/s, and with an assumed right atrial pressure of 3 mmHg, the estimated right ventricular systolic pressure is 09.8 mmHg. Left Atrium: Left atrial size was moderately dilated. Right Atrium: Right atrial size was moderately dilated. Pericardium: A small pericardial effusion is present. The pericardial effusion is circumferential. There is no evidence of cardiac tamponade. Mitral Valve: The mitral valve is abnormal. There is moderate calcification of the anterior and posterior mitral valve leaflet(s). Moderate mitral annular calcification. Trivial mitral valve regurgitation. Tricuspid Valve: The tricuspid valve is grossly normal. Tricuspid valve regurgitation is mild. Aortic Valve: The aortic valve is tricuspid. Aortic valve regurgitation is not visualized. No aortic stenosis is present. Aortic valve mean gradient measures 3.3 mmHg. Aortic valve peak gradient measures 6.1 mmHg. Aortic valve area, by VTI measures 1.29 cm. Pulmonic Valve: The pulmonic valve was grossly normal. Pulmonic valve regurgitation is trivial. Aorta: The aortic root and ascending aorta are structurally normal, with no evidence of dilitation.  Venous: The inferior vena cava is normal in size with greater than 50% respiratory variability, suggesting right atrial pressure of 3 mmHg. IAS/Shunts: No atrial level shunt detected by color flow Doppler. EKG: Rhythm strip during this exam demonstrates atrial fibrillation.  LEFT VENTRICLE PLAX 2D LV EF:         Left ventricular ejection fraction by PLAX is 47 %. LVIDd:         2.70 cm LVIDs:  2.10 cm LV PW:         1.80 cm LV IVS:        1.90 cm LVOT diam:     1.40 cm LV SV:         27 LV SV Index:   17 LVOT Area:     1.54 cm  RIGHT VENTRICLE            IVC RV Basal diam:  3.70 cm    IVC diam: 0.90 cm RV S prime:     7.81 cm/s TAPSE (M-mode): 1.9 cm LEFT ATRIUM           Index        RIGHT ATRIUM           Index LA diam:      4.00 cm 2.51 cm/m   RA Area:     12.40 cm LA Vol (A2C): 68.6 ml 42.97 ml/m  RA Volume:   28.60 ml  17.91 ml/m  AORTIC VALVE AV Area (Vmax):    1.40 cm AV Area (Vmean):   1.34 cm AV Area (VTI):     1.29 cm AV Vmax:           123.00 cm/s AV Vmean:          84.767 cm/s AV VTI:            0.206 m AV Peak Grad:      6.1 mmHg AV Mean Grad:      3.3 mmHg LVOT Vmax:         111.57 cm/s LVOT Vmean:        73.633 cm/s LVOT VTI:          0.172 m LVOT/AV VTI ratio: 0.84  AORTA Ao Root diam: 2.30 cm MITRAL VALVE                TRICUSPID VALVE MV Area (PHT): 3.10 cm     TR Peak grad:   16.3 mmHg MV Decel Time: 244 msec     TR Vmax:        202.00 cm/s MV E velocity: 152.00 cm/s MV A velocity: 54.55 cm/s   SHUNTS MV E/A ratio:  2.79         Systemic VTI:  0.17 m                             Systemic Diam: 1.40 cm Lyman Bishop MD Electronically signed by Lyman Bishop MD Signature Date/Time: 04/17/2022/12:23:41 PM    Final    DG CHEST PORT 1 VIEW  Result Date: 04/16/2022 CLINICAL DATA:  Endotracheal tube EXAM: PORTABLE CHEST 1 VIEW COMPARISON:  03/02/2022 FINDINGS: Endotracheal tube with tip measuring 2.3 cm above the carina. Shallow inspiration. Mild cardiac enlargement. Cardiac valve  prosthesis. Lungs are clear. No pleural effusions. No pneumothorax. Mediastinal contours appear intact. Calcified and tortuous aorta. Degenerative changes in the spine and shoulders. IMPRESSION: Endotracheal tube appears in satisfactory position. Cardiac enlargement. Lungs are clear. Electronically Signed   By: Lucienne Capers M.D.   On: 04/16/2022 19:57   CT ANGIO HEAD NECK W WO CM (CODE STROKE)  Result Date: 04/16/2022 CLINICAL DATA:  Stroke suspected EXAM: CT ANGIOGRAPHY HEAD AND NECK TECHNIQUE: Multidetector CT imaging of the head and neck was performed using the standard protocol during bolus administration of intravenous contrast. Multiplanar CT image reconstructions and MIPs were obtained to evaluate the vascular anatomy. Carotid stenosis measurements (when applicable)  are obtained utilizing NASCET criteria, using the distal internal carotid diameter as the denominator. RADIATION DOSE REDUCTION: This exam was performed according to the departmental dose-optimization program which includes automated exposure control, adjustment of the mA and/or kV according to patient size and/or use of iterative reconstruction technique. CONTRAST:  75 mL Omnipaque 350 COMPARISON:  CT head 06/24/2021 FINDINGS: CT HEAD FINDINGS Brain: No evidence of acute infarction, hemorrhage, mass, mass effect, or midline shift. No hydrocephalus or extra-axial collection. Redemonstrated hypodensity in the right frontal lobe, involving the right operculum, consistent with remote infarct. Lacunar infarct in right basal ganglia. Periventricular white matter changes, likely the sequela of chronic small vessel ischemic disease. Age-related cerebral volume loss. No Vascular: Possible hyperdense right MCA. No other hyperdense vessel. Atherosclerotic calcifications in the intracranial carotid and vertebral arteries. Skull: Negative for fracture or focal lesion. Sinuses/Orbits: Chronic opacification of the left sphenoid sinus. Minimal mucosal  thickening in the right frontal sinus. Otherwise clear paranasal sinuses. Status post bilateral lens replacements. No acute finding in the orbits. Other: The mastoid air cells are well aerated. ASPECTS [facility] Stroke Program Early CT Score) - Ganglionic level infarction (caudate, lentiform nuclei, internal capsule, insula, M1-M3 cortex): 7 - Supraganglionic infarction (M4-M6 cortex): 3 Total score (0-10 with 10 being normal): 10 CTA NECK FINDINGS Aortic arch: Standard branching. Imaged portion shows no evidence of aneurysm or dissection. Approximately 70% stenosis in the proximal left subclavian artery (series 9, image 262). No other significant stenosis of the major arch vessel origins. 80% stenosis in the right subclavian artery, after the carotid takeoff (series 14, image 99). Aortic atherosclerosis. Right carotid system: No evidence of dissection, occlusion, or hemodynamically significant stenosis (greater than 50%). Atherosclerotic disease at the bifurcation and in the proximal ICA is not hemodynamically significant. Left carotid system: No evidence of dissection, occlusion, or hemodynamically significant stenosis (greater than 50%). Atherosclerotic disease at the bifurcation and in the proximal ICA is not hemodynamically significant. Vertebral arteries: No evidence of dissection, occlusion, or hemodynamically significant stenosis (greater than 50%). Skeleton: No acute osseous abnormality. Degenerative changes in the cervical spine. Other neck: Multiple hypoenhancing nodules in the right greater than left thyroid lobe, the largest of which measures up to 1.5 cm. The thyroid was most recently evaluated with ultrasound in 2019. Upper chest: Emphysematous changes. No focal pulmonary opacity or pleural effusion. Review of the MIP images confirms the above findings CTA HEAD FINDINGS Evaluation is somewhat limited by bolus timing. Anterior circulation: Both internal carotid arteries are patent to the termini,  without significant stenosis. A1 segments patent, hypoplastic or stenotic on the right. Suspect an azygous A2 versus occlusion the right A2. Mild stenosis in the distal left ACA (series 9, image 64). Anterior cerebral arteries otherwise are patent to their distal aspects. Occlusion of the distal right M1 (series 9, image 93), without evidence of distal reconstitution. No left M1 stenosis or occlusion. Left MCA branches are perfused to their distal aspects without significant stenosis. Posterior circulation: Vertebral arteries patent to the vertebrobasilar junction, with severe stenosis in the mid left V4 (series 9, image 139 and moderate stenosis in the mid to distal right V4 (series 9, image 140). Posterior inferior cerebellar arteries patent proximally. Basilar patent to its distal aspect. Superior cerebellar arteries patent proximally. Patent left P1. Fetal origin of the right PCA from the right posterior communicating artery. The left posterior communicating artery is also patent. PCAs perfused to their distal aspects without stenosis. Venous sinuses: As permitted by contrast timing, patent. Anatomic variants:  Fetal origin of the right PCA. Review of the MIP images confirms the above findings IMPRESSION: 1. Occlusion of the distal right M1. 2. 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 3. Severe stenosis in the mid left V4, moderate stenosis in the mid to distal right V4, and mild stenosis in the distal left ACA. 4. 70% stenosis in the proximal left subclavian artery and 80% stenosis in the proximal right subclavian artery. No other hemodynamically significant stenosis in the neck. 5. No acute intracranial process. 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 ASPECTS is 10. 6. Aortic atherosclerosis and emphysema. 7. Multiple hypoenhancing nodules in the right greater than left thyroid lobe, the largest of which measures up to 1.5 cm. The thyroid was most recently evaluated by ultrasound in 2019. If further  evaluation is clinically indicated, taking into account the patient's age and clinical status, a nonemergent thyroid ultrasound could be obtained. Aortic Atherosclerosis (ICD10-I70.0) and Emphysema (ICD10-J43.9). Impression #1 was discussed by telephone on 04/16/2022 at 4:18 pm with provider [facility] . Impression #2 was communicated on 04/16/2022 at 4:43 pm to provider Dr. Lorrin Goodell via secure text paging. Electronically Signed   By: Merilyn Baba M.D.   On: 04/16/2022 16:45    Labs:  CBC: Recent Labs    09/07/21 0000 04/16/22 1609 04/16/22 1616 04/16/22 1916 04/16/22 2046 04/17/22 0554  WBC 11.5  --  4.4  --   --  10.6*  HGB 9.4*   < > 11.1* 10.1* 11.9* 11.1*  HCT 29*   < > 35.9* 31.6* 35.0* 33.9*  PLT 422*  --  272  --   --  307   < > = values in this interval not displayed.    COAGS: Recent Labs    04/16/22 1616  INR 1.1  APTT 30    BMP: Recent Labs    09/07/21 0000 04/16/22 1609 04/16/22 1616 04/16/22 2046 04/17/22 0554  NA 136* 138 137 137 135  K 4.5 3.3* 3.3* 3.6 4.2  CL 103 102 102  --  103  CO2 25*  --  23  --  22  GLUCOSE  --  178* 176*  --  167*  BUN '18 17 15  '$ --  16  CALCIUM 9.0  --  9.0  --  9.1  CREATININE 1.1 1.60* 1.61*  --  1.27*  GFRNONAA  --   --  29*  --  39*    LIVER FUNCTION TESTS: Recent Labs    09/07/21 0000 04/16/22 1616  BILITOT  --  0.7  AST 25 31  ALT 16 22  ALKPHOS 52 53  PROT  --  6.2*  ALBUMIN 3.6 3.4*    Assessment and Plan:  R MCA revascularization 2/2 IR will follow Plans per Stroke team   Electronically Signed: Lavonia Drafts, PA-C 04/18/2022, 9:33 AM   I spent a total of 15 Minutes at the the patient's bedside AND on the patient's [facility] floor or unit, greater than 50% of which was counseling/coordinating care for R MCA revascularization

## 2022-04-18 NOTE — Evaluation (Addendum)
Clinical/Bedside Swallow Evaluation Patient Details  Name: [name] MRN: [id number] Date of Birth: 29-Nov-1925  Today's Date: 04/18/2022 Time: SLP Start Time (ACUTE ONLY): 0258 SLP Stop Time (ACUTE ONLY): 0905 SLP Time Calculation (min) (ACUTE ONLY): 14 min  Past Medical History:  Past Medical History:  Diagnosis Date   Abnormal gait 06/24/2015   Last Assessment & Plan:   Relevant Hx:  Course:  Daily Update:  Today's Plan:uses her cane for this and no recent falls     Electronically signed by: Mayer Camel, NP  07/23/15 1529   Acute cough 08/25/2011   Acute kidney injury superimposed on chronic kidney disease ([place]) 10/18/2020   Anemia due to stage 3 chronic kidney disease ([place]) 06/24/2015   Last Assessment & Plan: Formatting of this note might be different from the original. Relevant Hx: Course: Daily Update: Today's Plan:update her CMP for her Electronically signed by: Mayer Camel, NP 07/23/15 1528 Formatting of this note might be different from the original. Last Assessment & Plan: Relevant Hx: Course: Daily Update: Today's Plan:update her CMP for her Electronically    Anxiety 06/24/2015   Last Assessment & Plan:   Relevant Hx:  Course:  Daily Update:  Today's Plan:this is stable for her at this time     Electronically signed by: Mayer Camel, NP  07/23/15 1529   Aortic stenosis, severe    a. 07/2017: s/p TAVR w/ an Edwards Sapien 3 THV (size 26 mm, model # [id number], serial # [id number])   Atherosclerosis of coronary artery 06/24/2015   Formatting of this note might be different from the original. Ostial RCA DES, 2010 Last Assessment & Plan: stable Last Assessment & Plan: Relevant Hx: Course: Daily Update: Today's Plan:she has felt she was stable from this and she has cardiologist she has followed with . Electronically signed by: Mayer Camel, NP 07/23/15 1523 Ostial RCA DES, 2010 Last Assessment & Plan: stable   Atrial  fibrillation ([place]) 03/19/2022   Bilateral hearing loss 06/25/2015   Bilateral sensorineural hearing loss 12/26/2020   Breast cancer, right breast ([place]) 03/24/2008   CAD (coronary artery disease)    a. 2010: s/p stent to RCA   Chronic diastolic (congestive) heart failure ([place]) 08/04/2017   Chronic kidney disease, stage III (moderate) (H[facility])    Chronic recurrent major depressive disorder ([place]) 06/24/2015   Last Assessment & Plan:   Relevant Hx:  Course:  Daily Update:  Today's Plan:this is stable for her     Electronically signed by: Mayer Camel, NP  07/23/15 1530   Chronic sinusitis    Coronary artery disease involving native coronary artery of native heart with angina pectoris ([place]) 06/24/2015   Formatting of this note might be different from the original. Overview: Ostial RCA DES, 2010 Last Assessment & Plan: stable Last Assessment & Plan: Formatting of this note might be different from the original. Relevant Hx: Course: Daily Update: Today's Plan:she has felt she was stable from this and she has cardiologist she has followed with . Electronically signed by: Mayer Camel, N   Coronary artery disease involving native coronary artery of native heart without angina pectoris    Ostial RCA DES, 2010   Decreased hearing of both ears 06/25/2015   Deficiency anemia 03/11/2021   Dehydration 10/19/2020   Depressive disorder    Dizziness 08/14/2018   [place] of macula of both eyes 09/12/2018   Ductal carcinoma in situ of breast 06/23/2007   Encounter for long-term current  use of high risk medication 06/24/2015   Epiretinal membrane (ERM) of left eye 09/12/2018   Essential hypertension, benign 02/16/2013   Gastro-esophageal reflux disease with esophagitis 06/24/2015   Last Assessment & Plan:   Relevant Hx:  Course:  Daily Update:  Today's Plan:she is stable from this      Electronically signed by: Mayer Camel, NP  07/23/15 1526   Gastroesophageal reflux  disease without esophagitis 12/27/2016   History of breast cancer    a. s/p lumpectomies and XRT   Hyperlipemia    Hyperlipidemia 02/16/2013   Hypertension    Hypothyroid    Hypothyroidism, acquired 06/24/2015   Last Assessment & Plan:   Relevant Hx:  Course:  Daily Update:  Today's Plan:update her TSH for her today     Electronically signed by: Mayer Camel, NP  07/23/15 1526   Incisional hernia, without obstruction or gangrene 02/24/2019   Insomnia    Intermediate stage nonexudative age-related macular degeneration of both eyes 01/19/2021   Irritation of oral cavity 11/23/2019   Malaise and fatigue 06/24/2015   Last Assessment & Plan:   Relevant Hx:  Course:  Daily Update:  Today's Plan:she is up and down with her energy and she feels that for the most part she has been stable     Electronically signed by: Mayer Camel, NP  07/23/15 1533   Malignant neoplasm of central portion of left female breast ([place]) 06/24/2015   Last Assessment & Plan:   Relevant Hx:  Course:  Daily Update:  Today's Plan:she is now receiving injections once a month for this and is hopeful will keep this at bay as this has been recurrent     Electronically signed by: Mayer Camel, NP  07/23/15 1530   Melena 03/19/2022   Memory change 07/23/2015   Last Assessment & Plan:   Relevant Hx:  Course:  Daily Update:  Today's Plan:she is going to take the aricept at supper     Electronically signed by: Mayer Camel, NP  07/23/15 1532   Memory loss    Multiple thyroid nodules 02/09/2012   Osteoarthritis    Osteoporosis 03/24/2020   Other allergic rhinitis 12/27/2016   Plantar fat pad atrophy 01/19/2016   Posterior vitreous detachment of left eye 09/12/2018   Primary insomnia 06/24/2015   Last Assessment & Plan:   Relevant Hx:  Course:  Daily Update:  Today's Plan:she feels that this is stable for her at this time     Electronically signed by: Mayer Camel,  NP  07/23/15 1531   Primary localized osteoarthrosis, ankle and foot 01/15/2016   Primary osteoarthritis involving multiple joints 06/24/2015   Last Assessment & Plan:   Relevant Hx:  Course:  Daily Update:  Today's Plan:she is stable from her joint though she has discomfort if she overexerts.     Electronically signed by: Mayer Camel, NP  07/23/15 1528   Reflux esophagitis    S/P TAVR (transcatheter aortic valve replacement) 08/02/2017   Severe aortic stenosis 02/16/2013   Physician Review  Conclusions: 1. Mild concentric left ventricular hypertrophy.  2. Left ventricular ejection fraction estimated by 2D at 60-65 percent.  3. There were no regional wall motion abnormalities.  4. Mild mitral annular calcification.  5. Trace mitral valve regurgitation.  6. Trivial tricuspid regurgitation.  7. Moderate increased thickness and calcification of the trileaflet aortic val   Stage III chronic kidney disease ([place]) 06/24/2015   Last Assessment & Plan:  Relevant Hx:  Course:  Daily Update:  Today's Plan:update her CMP for her     Electronically signed by: Mayer Camel, NP  07/23/15 1528   Thyroid nodule 06/24/2015   Vitreous membranes and strands 01/19/2021   PPV on 02/11/2021 OS   Past Surgical History:  Past Surgical History:  Procedure Laterality Date   ANGIOPLASTY     BIOPSY  10/21/2020   Procedure: BIOPSY;  Surgeon: Carol Ada, MD;  Location: [facility];  Service: Endoscopy;;   BREAST LUMPECTOMY     x2   ESOPHAGOGASTRODUODENOSCOPY Left 10/21/2020   Procedure: ESOPHAGOGASTRODUODENOSCOPY (EGD);  Surgeon: Carol Ada, MD;  Location: [facility];  Service: Endoscopy;  Laterality: Left;  Abnormal barium swallow   FOOT TENDON SURGERY     GALLBLADDER SURGERY     INCISIONAL HERNIA REPAIR N/A 03/01/2019   Procedure: OPEN INCISIONAL HERNIA REPAIR;  Surgeon: Armandina Gemma, MD;  Location: [facility];  Service: General;  Laterality: N/A;   INSERTION OF MESH N/A 03/01/2019    Procedure: INSERTION OF MESH;  Surgeon: Armandina Gemma, MD;  Location: [facility];  Service: General;  Laterality: N/A;   INTRAOPERATIVE TRANSTHORACIC ECHOCARDIOGRAM N/A 08/02/2017   Procedure: INTRAOPERATIVE TRANSTHORACIC ECHOCARDIOGRAM;  Surgeon: Burnell Blanks, MD;  Location: [place] [facility];  Service: Open Heart Surgery;  Laterality: N/A;   RADIOLOGY WITH ANESTHESIA N/A 04/16/2022   Procedure: IR WITH ANESTHESIA;  Surgeon: Luanne Bras, MD;  Location: [place] [facility];  Service: Radiology;  Laterality: N/A;   REPLACEMENT TOTAL KNEE BILATERAL     RIGHT/LEFT HEART CATH AND CORONARY ANGIOGRAPHY N/A 07/05/2017   Procedure: RIGHT/LEFT HEART CATH AND CORONARY ANGIOGRAPHY;  Surgeon: Belva Crome, MD;  Location: [facility];  Service: Cardiovascular;  Laterality: N/A;   SKIN CANCER EXCISION  2018   right nostril    TOTAL ABDOMINAL HYSTERECTOMY     TRANSCATHETER AORTIC VALVE REPLACEMENT, TRANSFEMORAL N/A 08/02/2017   Procedure: TRANSCATHETER AORTIC VALVE REPLACEMENT, TRANSFEMORAL;  Surgeon: Burnell Blanks, MD;  Location: [facility];  Service: Open Heart Surgery;  Laterality: N/A;   HPI:  Pt is a 89 y.o. female who presented secondary to chest pain, dysarthria, left-sided weakness, right gaze deviation and left-sided neglect. TNK given. 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 Pt s/p mechanical thrombectomy with successful recanalization (TICI 3) 2/3; pt left intubated post procedure, but extubated later that day. Pt failed the [facility] twice since she stopped drinking. PMH: atrial fibrillation not on anticoagulation, aortic stenosis status post TAVR, CAD, CKD stage III, depression, breast cancer, GERD, hyperlipidemia, hypertension, hypothyroidism and arthritis.    Assessment / Plan / Recommendation  Clinical Impression  Pt was seen for bedside swallow evaluation and she denied a history of dysphagia. Pt's  son arrived during the evaluation and he reported that the pt had a pharyngeal yeast infection and has been advised to take small bites to avoid a recurrence. Oral mechanism exam was limited due to pt's difficulty following some commands; however, oral motor strength and ROM appeared grossly WFL. Pt's natural dentition was adequate. Mild oral holding was noted with puree, but her oropharyngeal swallow mechanism appeared [facility]. A regular texture diet with thin liquids is recommended at this time and SLP will follow briefly to ensure tolerance, SLP Visit Diagnosis: Dysphagia, unspecified (R13.10)    Aspiration Risk  No limitations    Diet Recommendation Regular;Thin liquid   Liquid Administration via: Cup;Straw Medication Administration:  Whole meds with liquid Supervision: Patient able to self feed Compensations: Slow rate;Small sips/bites Postural Changes: Seated upright at 90 degrees    Other  Recommendations Oral Care Recommendations: Oral care BID    Recommendations for follow up therapy are one component of a multi-disciplinary discharge planning process, led by the attending physician.  Recommendations may be updated based on patient status, additional functional criteria and insurance authorization.  Follow up Recommendations  (Continued SLP services at level of care recommended by PT/OT)      Assistance Recommended at Discharge    Functional Status Assessment    Frequency and Duration min 1 x/week  1 week       Prognosis Prognosis for Safe Diet Advancement: Good      Swallow Study   General Date of Onset: 04/17/22 HPI: Pt is a 89 y.o. female who presented secondary to chest pain, dysarthria, left-sided weakness, right gaze deviation and left-sided neglect. TNK given. 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 Pt s/p mechanical thrombectomy with successful recanalization (TICI  3) 2/3; pt left intubated post procedure, but extubated later that day. Pt failed the [place] twice since she stopped drinking. PMH: atrial fibrillation not on anticoagulation, aortic stenosis status post TAVR, CAD, CKD stage III, depression, breast cancer, GERD, hyperlipidemia, hypertension, hypothyroidism and arthritis. Type of Study: Bedside Swallow Evaluation Previous Swallow Assessment: none Diet Prior to this Study: NPO Temperature Spikes Noted: No Respiratory Status: Nasal cannula History of Recent Intubation: Yes Length of Intubations (days): 1 days Date extubated: 04/17/22 Behavior/Cognition: Alert;Cooperative;Pleasant mood;Requires cueing;Doesn't follow directions Oral Cavity Assessment: Within Functional Limits Oral Care Completed by SLP: No Oral Cavity - Dentition: Adequate natural dentition Vision: Functional for self-feeding Self-Feeding Abilities: Needs assist Patient Positioning: Upright in bed;Postural control adequate for testing Baseline Vocal Quality: Low vocal intensity Volitional Cough: Cognitively unable to elicit Volitional Swallow: Able to elicit    Oral/Motor/Sensory Function Overall Oral Motor/Sensory Function: Within functional limits   Ice Chips Ice chips: Within functional limits Presentation: Spoon   Thin Liquid Thin Liquid: Within functional limits Presentation: Straw;Cup    Nectar Thick Nectar Thick Liquid: Not tested   Honey Thick Honey Thick Liquid: Not tested   Puree Puree: Within functional limits Presentation: Spoon Oral Phase Functional Implications: Oral holding   Solid     Solid: Within functional limits Presentation: Self Fed     Jaycee Pelzer I. Hardin Negus, [place], [place] Office number [phone]  Horton Marshall 04/18/2022,9:53 AM

## 2022-04-18 NOTE — Evaluation (Signed)
Physical Therapy Evaluation Patient Details Name: [name] MRN: [id number] DOB: December 10, 1925 Today's Date: 04/18/2022  History of Present Illness  Pt is a 89yo female who presented to ED with L sided weakness, L neglect, R gaze deviations, dysarthria, and chest pain. Imaging revealed  R M1 occlusionTNK given and s/p thrombectomy of R M1  PMH: afib not on anticoagulation, aortic stenosis status post TAVR, CAD, CKD stage III, depression, breast cancer, hyperlipidemia, hypertension, hypothyroidism, anxiety, and arthritis   Clinical Impression  Pt admitted with above. Pt presenting with generalized weakness with L UE and LE weaker than the R, delayed processing, impaired sequencing/motor planning, impaired balance, decreased insight to deficits, and safety. Pt currently in ILF with hired 24/7 assist. Pt was functioning at [facility] with use of rollator for mobility. Pt now requiring 2 person assist for transfers and assist for ADLs. Family would like to keep patient home as long as possible as they do have hired 24/7 assist. Pt to benefit from [place] upon d/c to achieve safe level of function for hired aides to be able to safely assist patient at home. Acute PT to cont to follow.       Recommendations for follow up therapy are one component of a multi-disciplinary discharge planning process, led by the attending physician.  Recommendations may be updated based on patient status, additional functional criteria and insurance authorization.  Follow Up Recommendations Acute inpatient rehab (3hours/day)      Assistance Recommended at Discharge Frequent or constant Supervision/Assistance  Patient can return home with the following  A lot of help with walking and/or transfers;A lot of help with bathing/dressing/bathroom;Direct supervision/assist for medications management;Direct supervision/assist for financial management;Assist for transportation;Help with stairs or ramp for entrance    Equipment  Recommendations None recommended by PT  Recommendations for Other Services  Rehab consult    Functional Status Assessment Patient has had a recent decline in their functional status and demonstrates the ability to make significant improvements in function in a reasonable and predictable amount of time.     Precautions / Restrictions Precautions Precautions: Fall Precaution Comments: afib, HR 1110s-130s Restrictions Weight Bearing Restrictions: No      Mobility  Bed Mobility Overal bed mobility: Needs Assistance Bed Mobility: Rolling, Sidelying to Sit Rolling: Mod assist Sidelying to sit: Mod assist, +2 for physical assistance, HOB elevated       General bed mobility comments: step by step directional cues, modA for trunk elevation and to scoot hips to EOB    Transfers Overall transfer level: Needs assistance Equipment used: Rolling walker (2 wheels) Transfers: Sit to/from Stand, Bed to chair/wheelchair/BSC Sit to Stand: Mod assist, +2 physical assistance   Step pivot transfers: Mod assist, +2 physical assistance, +2 safety/equipment       General transfer comment: pt pulling up on walker with PT/OT holding down walker, pt with trunk flexion. Pt perseverating on using RW for transfer as that is what pt is used to. Pt required maxA for walker management, maxA for L LE advancement and modA for weight shifting, pt with noted impaired sequencing and motor planning    Ambulation/Gait               General Gait Details: unable this date  Stairs            Wheelchair Mobility    Modified Rankin (Stroke Patients Only) Modified Rankin (Stroke Patients Only) Pre-Morbid Rankin Score: Moderate disability Modified Rankin: Moderately severe disability     Balance Overall  balance assessment: Needs assistance Sitting-balance support: Feet supported, Bilateral upper extremity supported Sitting balance-Leahy Scale: Fair     Standing balance support: Bilateral  upper extremity supported, During functional activity Standing balance-Leahy Scale: Zero Standing balance comment: dependent on external support                             Pertinent Vitals/Pain Pain Assessment Pain Assessment: Faces Faces Pain Scale: No hurt    Home Living Family/patient expects to be discharged to:: Private residence Living Arrangements: Alone Available Help at Discharge: Personal care attendant;Available 24 hours/day Type of Home: Independent living facility Home Access: Level entry       Home Layout: One level Home Equipment: Rollator (4 wheels)      Prior Function Prior Level of Function : Needs assist       Physical Assist : Mobility (physical);ADLs (physical)     Mobility Comments: Since Dec could get up from bed by herself and get to seat on rollator with min guard A ADLs Comments: supervision from hired caregivers     Hand Dominance   Dominant Hand: Right    Extremity/Trunk Assessment   Upper Extremity Assessment Upper Extremity Assessment: Defer to OT evaluation    Lower Extremity Assessment Lower Extremity Assessment: Generalized weakness (no bilat knee buckling in standing, grossly minimally 3/5 however needed assist with advance L LE)    Cervical / Trunk Assessment Cervical / Trunk Assessment: Kyphotic  Communication   Communication: HOH  Cognition Arousal/Alertness: Awake/alert Behavior During Therapy: WFL for tasks assessed/performed Overall Cognitive Status: Impaired/Different from baseline Area of Impairment: Following commands, Safety/judgement, Problem solving, Memory                     Memory: Decreased recall of precautions, Decreased short-term memory (memory impairment at baseline) Following Commands: Follows one step commands with increased time Safety/Judgement: Decreased awareness of safety, Decreased awareness of deficits   Problem Solving: Slow processing, Decreased initiation, Difficulty  sequencing, Requires verbal cues, Requires tactile cues General Comments: A&Ox3, unaware of why she was in the hospital, slow processing and impaired motor planning however suspect pt's HOH to contribute to the delay in processing        General Comments General comments (skin integrity, edema, etc.): HR 110-130s t/o session, aware pt with afib otherwise VSS, SpO2 >97% on RA    Exercises     Assessment/Plan    PT Assessment Patient needs continued PT services  PT Problem List Decreased strength;Decreased range of motion;Decreased activity tolerance;Decreased balance;Decreased mobility;Decreased coordination;Decreased cognition;Decreased knowledge of use of DME;Decreased safety awareness       PT Treatment Interventions DME instruction;Stair training;Functional mobility training;Gait training;Therapeutic activities;Therapeutic exercise;Balance training;Neuromuscular re-education    PT Goals (Current goals can be found in the Care Plan section)  Acute Rehab PT Goals Patient Stated Goal: home PT Goal Formulation: With patient/family Time For Goal Achievement: 05/02/22 Potential to Achieve Goals: Good    Frequency Min 4X/week     Co-evaluation PT/OT/SLP Co-Evaluation/Treatment: Yes Reason for Co-Treatment: Complexity of the patient's impairments (multi-system involvement)           AM-PAC PT "6 Clicks" Mobility  Outcome Measure Help needed turning from your back to your side while in a flat bed without using bedrails?: A Lot Help needed moving from lying on your back to sitting on the side of a flat bed without using bedrails?: A Lot Help needed moving to and from  a bed to a chair (including a wheelchair)?: A Lot Help needed standing up from a chair using your arms (e.g., wheelchair or bedside chair)?: A Lot Help needed to walk in hospital room?: Total Help needed climbing 3-5 steps with a railing? : Total 6 Click Score: 10    End of Session Equipment Utilized During  Treatment: Gait belt Activity Tolerance: Patient tolerated treatment well Patient left: in chair;with call bell/phone within reach;with chair alarm set;with family/visitor present Nurse Communication: Mobility status PT Visit Diagnosis: Unsteadiness on feet (R26.81);Muscle weakness (generalized) (M62.81);Difficulty in walking, not elsewhere classified (R26.2)    Time: 4585-9292 PT Time Calculation (min) (ACUTE ONLY): 46 min   Charges:   PT Evaluation $PT Eval Moderate Complexity: 1 Mod PT Treatments $Therapeutic Activity: 8-22 mins        Kittie Plater, PT, DPT Acute Rehabilitation Services Secure chat preferred Office #: [phone]   Berline Lopes 04/18/2022, 12:15 PM

## 2022-04-18 NOTE — Progress Notes (Signed)
Son and daughter in law left about 8:00pm due to visiting hours ending. Prior to them leaving the patient started getting anxious and they inform me that she takes '5mg'$  buspar TID at home. Got a verbal order from attending physician. The patient admits to feelings of anxiety. This was administered crushed in applesauce since there was concern about the patient taking thin liquids with her yale swallow screen. The patient tolerated this well without coughing or signs of distress.   9:00pm- contacted Neurology on call for HR control orders and they were in a code stroke and asked me to contact critical care. eLink contacted for HR 130s-140s frequently with agitation )otherwise HR 110s-120s as it had been all day- Atrial fibrillation). They had the MD call me to discuss the case. It appears the patient had clonidine 0.'1mg'$  BID on her home meds list which could help HTN and anxiety and insomnia possibly. Still on cleviprex drip as well. This may in turn help her HR return to baseline of what it had been all day. Awaited orders to be entered and verified by pharmacy.   10pm- patient remains restless and mildly confused but no neuro change otherwise.   2300- clonidine administered as ordered. The patient maybe slept 30 minutes consecutive on this. Very light sleeper.

## 2022-04-18 NOTE — Progress Notes (Signed)
Inpatient Rehab Admissions Coordinator:  ? ?Per therapy recommendations,  patient was screened for CIR candidacy by Sinaya Minogue, MS, CCC-SLP. At this time, Pt. Appears to be a a potential candidate for CIR. I will place   order for rehab consult per protocol for full assessment. Please contact me any with questions. ? ?Julien Oscar, MS, CCC-SLP ?Rehab Admissions Coordinator  ?[phone] (celll) ?[phone] (office) ? ?

## 2022-04-18 NOTE — Evaluation (Signed)
Occupational Therapy Evaluation Patient Details Name: [name] MRN: [id number] DOB: 12-11-1925 Today's Date: 04/18/2022   History of Present Illness Pt is a 89yo female who presented to ED with L sided weakness, L neglect, R gaze deviations, dysarthria, and chest pain. Imaging revealed  R M1 occlusionTNK given and s/p thrombectomy of R M1  PMH: afib not on anticoagulation, aortic stenosis status post TAVR, CAD, CKD stage III, depression, breast cancer, hyperlipidemia, hypertension, hypothyroidism, anxiety, and arthritis   Clinical Impression   This 89 yo female admitted with above presents to acute OT with PLOF of being able to do her own basic ADLs with S from her 24 hour caregivers from a rollator level. Currently she is setup/S-total A for basic ADLs and Mod A +2 for bed mobility and transfers. She will continue to benefit from acute OT with follow up on AIR to return home with 24 hour care again.      Recommendations for follow up therapy are one component of a multi-disciplinary discharge planning process, led by the attending physician.  Recommendations may be updated based on patient status, additional functional criteria and insurance authorization.   Follow Up Recommendations  Acute inpatient rehab (3hours/day)     Assistance Recommended at Discharge Frequent or constant Supervision/Assistance  Patient can return home with the following Two people to help with walking and/or transfers;Two people to help with bathing/dressing/bathroom;Assistance with cooking/housework;Assistance with feeding;Help with stairs or ramp for entrance;Assist for transportation;Direct supervision/assist for financial management;Direct supervision/assist for medications management    Functional Status Assessment  Patient has had a recent decline in their functional status and demonstrates the ability to make significant improvements in function in a reasonable and predictable amount of time.   Equipment Recommendations  Other (comment) (TBD next venue)    Recommendations for Other Services Rehab consult     Precautions / Restrictions Precautions Precautions: Fall Precaution Comments: afib, HR 1110s-130s Restrictions Weight Bearing Restrictions: No      Mobility Bed Mobility Overal bed mobility: Needs Assistance Bed Mobility: Rolling, Sidelying to Sit Rolling: Mod assist Sidelying to sit: Mod assist, +2 for physical assistance, HOB elevated       General bed mobility comments: step by step directional cues, modA for trunk elevation and to scoot hips to EOB    Transfers Overall transfer level: Needs assistance Equipment used: Rolling walker (2 wheels) Transfers: Sit to/from Stand, Bed to chair/wheelchair/BSC Sit to Stand: Mod assist, +2 physical assistance     Step pivot transfers: Mod assist, +2 physical assistance, +2 safety/equipment     General transfer comment: pt pulling up on walker with PT/OT holding down walker, pt with trunk flexion. Pt perseverating on using RW for transfer as that is what pt is used to. Pt required maxA for walker management, maxA for L LE advancement and modA for weight shifting, pt with noted impaired sequencing and motor planning      Balance Overall balance assessment: Needs assistance Sitting-balance support: Feet supported, Bilateral upper extremity supported Sitting balance-Leahy Scale: Fair     Standing balance support: Bilateral upper extremity supported, During functional activity Standing balance-Leahy Scale: Zero Standing balance comment: dependent on external support                           ADL either performed or assessed with clinical judgement   ADL Overall ADL's : Needs assistance/impaired Eating/Feeding: Set up;Supervision/ safety;Sitting Eating/Feeding Details (indicate cue type and reason): She did  ask her son to feed her after she had fed herself ~1/2 her omelet Grooming: Set  up;Supervision/safety;Sitting   Upper Body Bathing: Set up;Supervision/ safety;Sitting   Lower Body Bathing: Total assistance Lower Body Bathing Details (indicate cue type and reason): Mod A +2 sit<>stand Upper Body Dressing : Moderate assistance;Sitting   Lower Body Dressing: Total assistance Lower Body Dressing Details (indicate cue type and reason): Mod A +2 sit<>stand Toilet Transfer: Moderate assistance;+2 for physical assistance;Stand-pivot;Rolling walker (2 wheels) Toilet Transfer Details (indicate cue type and reason): simulated bed>recliner next to bed on right Toileting- Clothing Manipulation and Hygiene: Total assistance Toileting - Clothing Manipulation Details (indicate cue type and reason): Mod A +2 sit<>stand       General ADL Comments: Educated son and dtr in law in room about the options for AIR, SNF, HH     Vision Baseline Vision/History: 1 Wears glasses Ability to See in Adequate Light: 0 Adequate              Pertinent Vitals/Pain Pain Assessment Pain Assessment: Faces Faces Pain Scale: No hurt     Hand Dominance Right   Extremity/Trunk Assessment Upper Extremity Assessment Upper Extremity Assessment: Generalized weakness           Communication Communication Communication: HOH   Cognition Arousal/Alertness: Awake/alert Behavior During Therapy: WFL for tasks assessed/performed Overall Cognitive Status: Impaired/Different from baseline (history of cognitive impairment at baseline, but worse now) Area of Impairment: Following commands, Safety/judgement, Problem solving, Memory                     Memory: Decreased recall of precautions, Decreased short-term memory Following Commands: Follows one step commands with increased time Safety/Judgement: Decreased awareness of safety, Decreased awareness of deficits   Problem Solving: Slow processing, Decreased initiation, Difficulty sequencing, Requires verbal cues, Requires tactile  cues General Comments: A&Ox3, unaware of why she was in the hospital, slow processing and impaired motor planning however suspect pt's HOH to contribute to the delay in processing     General Comments  HR 110-130s t/o session, aware pt with afib otherwise VSS, SpO2 >97% on RA            Home Living Family/patient expects to be discharged to:: Private residence Living Arrangements: Alone Available Help at Discharge: Personal care attendant;Available 24 hours/day Type of Home: Independent living facility Home Access: Level entry     Home Layout: One level     Bathroom Shower/Tub: Walk-in Hydrologist: Handicapped height     Home Equipment: Rollator (4 wheels)          Prior Functioning/Environment Prior Level of Function : Needs assist       Physical Assist : Mobility (physical);ADLs (physical)     Mobility Comments: Since Dec could get up from bed by herself and get to seat on rollator with min guard A but was not walking ADLs Comments: supervision from hired caregivers        OT Problem List: Impaired balance (sitting and/or standing);Decreased cognition;Decreased safety awareness      OT Treatment/Interventions: Self-care/ADL training;DME and/or AE instruction;Patient/family education;Balance training    OT Goals(Current goals can be found in the care plan section) Acute Rehab OT Goals Patient Stated Goal: family would like to try AIR (not sure if pt would tolerate) OT Goal Formulation: With patient/family Time For Goal Achievement: 05/02/22 Potential to Achieve Goals: Good  OT Frequency: Min 2X/week    Co-evaluation PT/OT/SLP Co-Evaluation/Treatment: Yes Reason for Co-Treatment:  For patient/therapist safety;To address functional/ADL transfers PT goals addressed during session: Mobility/safety with mobility;Balance;Strengthening/ROM;Proper use of DME OT goals addressed during session: Strengthening/ROM;ADL's and self-care       AM-PAC OT "6 Clicks" Daily Activity     Outcome Measure Help from another person eating meals?: A Little Help from another person taking care of personal grooming?: A Little Help from another person toileting, which includes using toliet, bedpan, or urinal?: Total Help from another person bathing (including washing, rinsing, drying)?: A Lot Help from another person to put on and taking off regular upper body clothing?: A Lot Help from another person to put on and taking off regular lower body clothing?: Total 6 Click Score: 12   End of Session Equipment Utilized During Treatment: Gait belt;Rolling walker (2 wheels) Nurse Communication: Mobility status  Activity Tolerance: Patient tolerated treatment well Patient left: in chair;with call bell/phone within reach;with chair alarm set;with family/visitor present  OT Visit Diagnosis: Unsteadiness on feet (R26.81);Other abnormalities of gait and mobility (R26.89);Muscle weakness (generalized) (M62.81);Other symptoms and signs involving cognitive function                Time: 0973-5329 OT Time Calculation (min): 45 min Charges:  OT General Charges $OT Visit: 1 Visit OT Evaluation $OT Eval Moderate Complexity: [place] Office [phone]    Almon Register 04/18/2022, 5:04 PM

## 2022-04-19 ENCOUNTER — Ambulatory Visit: Payer: Medicare Other | Admitting: Internal Medicine

## 2022-04-19 ENCOUNTER — Other Ambulatory Visit (HOSPITAL_COMMUNITY): Payer: Self-pay

## 2022-04-19 DIAGNOSIS — Z8673 Personal history of transient ischemic attack (TIA), and cerebral infarction without residual deficits: Secondary | ICD-10-CM | POA: Diagnosis not present

## 2022-04-19 LAB — PHOSPHORUS: Phosphorus: 3.1 mg/dL (ref 2.5–4.6)

## 2022-04-19 LAB — CBC WITH DIFFERENTIAL/PLATELET
Abs Immature Granulocytes: 0.04 10*3/uL (ref 0.00–0.07)
Basophils Absolute: 0 10*3/uL (ref 0.0–0.1)
Basophils Relative: 1 %
Eosinophils Absolute: 0.1 10*3/uL (ref 0.0–0.5)
Eosinophils Relative: 1 %
HCT: 26.5 % — ABNORMAL LOW (ref 36.0–46.0)
Hemoglobin: 8.5 g/dL — ABNORMAL LOW (ref 12.0–15.0)
Immature Granulocytes: 1 %
Lymphocytes Relative: 20 %
Lymphs Abs: 1.3 10*3/uL (ref 0.7–4.0)
MCH: 27.7 pg (ref 26.0–34.0)
MCHC: 32.1 g/dL (ref 30.0–36.0)
MCV: 86.3 fL (ref 80.0–100.0)
Monocytes Absolute: 0.6 10*3/uL (ref 0.1–1.0)
Monocytes Relative: 10 %
Neutro Abs: 4.4 10*3/uL (ref 1.7–7.7)
Neutrophils Relative %: 67 %
Platelets: 194 10*3/uL (ref 150–400)
RBC: 3.07 MIL/uL — ABNORMAL LOW (ref 3.87–5.11)
RDW: 22.2 % — ABNORMAL HIGH (ref 11.5–15.5)
WBC: 6.4 10*3/uL (ref 4.0–10.5)
nRBC: 0 % (ref 0.0–0.2)

## 2022-04-19 LAB — BASIC METABOLIC PANEL
Anion gap: 7 (ref 5–15)
BUN: 14 mg/dL (ref 8–23)
CO2: 23 mmol/L (ref 22–32)
Calcium: 8.9 mg/dL (ref 8.9–10.3)
Chloride: 107 mmol/L (ref 98–111)
Creatinine, Ser: 0.97 mg/dL (ref 0.44–1.00)
GFR, Estimated: 54 mL/min — ABNORMAL LOW (ref >60)
Glucose, Bld: 105 mg/dL — ABNORMAL HIGH (ref 70–99)
Potassium: 3.4 mmol/L — ABNORMAL LOW (ref 3.5–5.1)
Sodium: 137 mmol/L (ref 135–145)

## 2022-04-19 LAB — MAGNESIUM: Magnesium: 1.6 mg/dL — ABNORMAL LOW (ref 1.7–2.4)

## 2022-04-19 MED ORDER — POTASSIUM CHLORIDE CRYS ER 20 MEQ PO TBCR
20.0000 meq | EXTENDED_RELEASE_TABLET | Freq: Once | ORAL | Status: AC
  Administered 2022-04-19: 20 meq via ORAL
  Filled 2022-04-19: qty 1

## 2022-04-19 MED ORDER — MAGNESIUM CHLORIDE 64 MG PO TBEC
1.0000 | DELAYED_RELEASE_TABLET | Freq: Every day | ORAL | Status: AC
  Administered 2022-04-19: 64 mg via ORAL
  Filled 2022-04-19: qty 1

## 2022-04-19 MED ORDER — APIXABAN 5 MG PO TABS
5.0000 mg | ORAL_TABLET | Freq: Two times a day (BID) | ORAL | Status: DC
  Administered 2022-04-19 – 2022-04-20 (×3): 5 mg via ORAL
  Filled 2022-04-19 (×3): qty 1

## 2022-04-19 MED ORDER — AMLODIPINE BESYLATE 2.5 MG PO TABS
2.5000 mg | ORAL_TABLET | Freq: Two times a day (BID) | ORAL | Status: DC
  Administered 2022-04-19 – 2022-04-20 (×3): 2.5 mg via ORAL
  Filled 2022-04-19 (×3): qty 1

## 2022-04-19 MED ORDER — FUROSEMIDE 20 MG PO TABS
20.0000 mg | ORAL_TABLET | Freq: Two times a day (BID) | ORAL | Status: DC
  Administered 2022-04-19 – 2022-04-20 (×2): 20 mg via ORAL
  Filled 2022-04-19 (×2): qty 1

## 2022-04-19 MED ORDER — FERROUS SULFATE 325 (65 FE) MG PO TABS
325.0000 mg | ORAL_TABLET | Freq: Two times a day (BID) | ORAL | Status: DC
  Administered 2022-04-19 – 2022-04-20 (×2): 325 mg via ORAL
  Filled 2022-04-19 (×2): qty 1

## 2022-04-19 NOTE — Plan of Care (Signed)
Due to tachycardia, increased metoprolol from every 12 hours to every 8 hours  Due to hypertension restarted home amlodipine 2.5 mg twice daily

## 2022-04-19 NOTE — Progress Notes (Signed)
Inpatient Rehab Coordinator Note:  I met with patient and her son, Harrington Challenger, at bedside to discuss CIR recommendations and goals/expectations of CIR stay.  We reviewed 3 hrs/day of therapy, physician follow up, and average length of stay 2 weeks (dependent upon progress) with goals of supervision to min assist.  Harrington Challenger confirms pt resides in [facility] in [place], and has 24/7 caregivers at baseline to assist with ADLs and limited ambulation.  Their goal would be for pt to return to this baseline, which I think will be reasonable with an ELOS of 2 weeks on CIR.  We discussed Medicare benefits, which I verified.  Pt was poorly interactive and on NRB 4L during my encounter. Pt's son states this is not typical for her except occasionally during evening hours.  PT to see today and will let me know how she does with therapy.   Shann Medal, PT, DPT Admissions Coordinator [phone] 04/19/22  1:00 PM

## 2022-04-19 NOTE — Progress Notes (Addendum)
Physical Therapy Treatment Patient Details Name: [name] MRN: [id number] DOB: 06-25-1925 Today's Date: 04/19/2022   History of Present Illness Pt is a 89yo female who presented to ED with L sided weakness, L neglect, R gaze deviations, dysarthria, and chest pain. Imaging revealed  R M1 occlusionTNK given and s/p thrombectomy of R M1  PMH: afib not on anticoagulation, aortic stenosis status post TAVR, CAD, CKD stage III, depression, breast cancer, hyperlipidemia, hypertension, hypothyroidism, anxiety, and arthritis    PT Comments    Pt progressing steadily towards her physical therapy goals; demonstrates improving L sided awareness, strength, and requiring less assist overall with bed mobility. Pt transferring to chair with two person moderate assist and a walker. SpO2 98% on NRB. Continues to demonstrate impaired sitting/standing balance, decreased endurance, weakness. Continue to recommend acute inpatient rehab (AIR) for post-acute therapy needs. Pt has excellent family support and is motivated to participate.    Recommendations for follow up therapy are one component of a multi-disciplinary discharge planning process, led by the attending physician.  Recommendations may be updated based on patient status, additional functional criteria and insurance authorization.  Follow Up Recommendations  Acute inpatient rehab (3hours/day)     Assistance Recommended at Discharge Frequent or constant Supervision/Assistance  Patient can return home with the following A lot of help with walking and/or transfers;A lot of help with bathing/dressing/bathroom;Direct supervision/assist for medications management;Direct supervision/assist for financial management;Assist for transportation;Help with stairs or ramp for entrance   Equipment Recommendations  None recommended by PT    Recommendations for Other Services       Precautions / Restrictions Precautions Precautions: Fall;Other  (comment) Precaution Comments: NRB Restrictions Weight Bearing Restrictions: No     Mobility  Bed Mobility Overal bed mobility: Needs Assistance Bed Mobility: Supine to Sit Rolling: Min assist         General bed mobility comments: good initiation by pt, minA with bed pad to complete scoot. hand over hand guidance for use of bed rail    Transfers Overall transfer level: Needs assistance Equipment used: Rolling walker (2 wheels) Transfers: Sit to/from Stand, Bed to chair/wheelchair/BSC Sit to Stand: Mod assist, +2 physical assistance   Step pivot transfers: Mod assist, +2 physical assistance       General transfer comment: Pt pulling up on walker with PT stabilizing, bilateral anterior foot block provided. Pt with increased trunk/hip flexion. Step by step cues for sequencing/direction. Improved L foot clearance    Ambulation/Gait                   Stairs             Wheelchair Mobility    Modified Rankin (Stroke Patients Only) Modified Rankin (Stroke Patients Only) Pre-Morbid Rankin Score: Moderate disability Modified Rankin: Moderately severe disability     Balance Overall balance assessment: Needs assistance Sitting-balance support: Feet supported, No upper extremity supported Sitting balance-Leahy Scale: Fair     Standing balance support: Bilateral upper extremity supported Standing balance-Leahy Scale: Poor Standing balance comment: reliant on RW                            Cognition Arousal/Alertness: Awake/alert Behavior During Therapy: WFL for tasks assessed/performed Overall Cognitive Status: Impaired/Different from baseline Area of Impairment: Following commands, Safety/judgement, Problem solving, Memory                     Memory: Decreased recall of  precautions, Decreased short-term memory Following Commands: Follows one step commands with increased time Safety/Judgement: Decreased awareness of safety,  Decreased awareness of deficits   Problem Solving: Slow processing, Decreased initiation, Difficulty sequencing, Requires verbal cues, Requires tactile cues General Comments: Impacted by [facility]        Exercises General Exercises - Upper Extremity Shoulder Flexion: Both, 10 reps, Supine General Exercises - Lower Extremity Long Arc Quad: Both, 10 reps, Seated Heel Slides: Both, 10 reps, Supine Other Exercises Other Exercises: Seated: functional reaching with LUE    General Comments        Pertinent Vitals/Pain Pain Assessment Pain Assessment: No/denies pain    Home Living                          Prior Function            PT Goals (current goals can now be found in the care plan section) Acute Rehab PT Goals Patient Stated Goal: home Potential to Achieve Goals: Good Progress towards PT goals: Progressing toward goals    Frequency    Min 3X/week      PT Plan Frequency needs to be updated    Co-evaluation              AM-PAC PT "6 Clicks" Mobility   Outcome Measure  Help needed turning from your back to your side while in a flat bed without using bedrails?: A Little Help needed moving from lying on your back to sitting on the side of a flat bed without using bedrails?: A Little Help needed moving to and from a bed to a chair (including a wheelchair)?: A Lot Help needed standing up from a chair using your arms (e.g., wheelchair or bedside chair)?: A Lot Help needed to walk in hospital room?: Total Help needed climbing 3-5 steps with a railing? : Total 6 Click Score: 12    End of Session Equipment Utilized During Treatment: Gait belt;Oxygen Activity Tolerance: Patient tolerated treatment well Patient left: in chair;with call bell/phone within reach;with chair alarm set;with family/visitor present Nurse Communication: Mobility status PT Visit Diagnosis: Unsteadiness on feet (R26.81);Muscle weakness (generalized) (M62.81);Difficulty in walking,  not elsewhere classified (R26.2)     Time: 2481-8590 PT Time Calculation (min) (ACUTE ONLY): 26 min  Charges:  $Therapeutic Activity: 23-37 mins                     Wyona Almas, PT, DPT Acute Rehabilitation Services Office [phone]    Deno Etienne 04/19/2022, 3:08 PM

## 2022-04-19 NOTE — Progress Notes (Addendum)
STROKE TEAM PROGRESS NOTE   INTERVAL HISTORY Her daughter and son are at the bedside.   Awaiting CIR approval. Plan for therapy and then to return to ILF. Eliquis to resume today.   Vitals:   04/19/22 0611 04/19/22 0733 04/19/22 1104 04/19/22 1316  BP: (!) 151/85 (!) 138/100 121/74   Pulse: 83 90 98 84  Resp: '18 20 18   '$ Temp:  (!) 97.2 F (36.2 C) 98.4 F (36.9 C)   TempSrc:  Oral Oral   SpO2:  97% 96%   Weight:      Height:       CBC:  Recent Labs  Lab 04/18/22 0921 04/19/22 0325  WBC 9.9 6.4  NEUTROABS 8.2* 4.4  HGB 9.8* 8.5*  HCT 29.8* 26.5*  MCV 85.9 86.3  PLT 276 580    Basic Metabolic Panel:  Recent Labs  Lab 04/16/22 1916 04/16/22 2046 04/18/22 0921 04/19/22 0325  NA  --    < > 135 137  K  --    < > 3.6 3.4*  CL  --    < > 104 107  CO2  --    < > 22 23  GLUCOSE  --    < > 141* 105*  BUN  --    < > 16 14  CREATININE  --    < > 1.18* 0.97  CALCIUM  --    < > 9.1 8.9  MG 1.7  --   --  1.6*  PHOS  --   --   --  3.1   < > = values in this interval not displayed.    Lipid Panel:  Recent Labs  Lab 04/17/22 0554 04/18/22 0441  CHOL 210*  --   TRIG 99 160*  HDL 55  --   CHOLHDL 3.8  --   VLDL 20  --   LDLCALC 135*  --     HgbA1c:  Recent Labs  Lab 04/17/22 0554  HGBA1C 4.6*    Urine Drug Screen: No results for input(s): "LABOPIA", "COCAINSCRNUR", "LABBENZ", "AMPHETMU", "THCU", "LABBARB" in the last 168 hours.  Alcohol Level  Recent Labs  Lab 04/16/22 1616  ETH <10     IMAGING past 24 hours No results found.  PHYSICAL EXAM  Constitutional: NAD. Laying in bed. Later in am ,sitting up in chair.  HEENT: [place], AT Respiratory: normal. Skin: WDI   Neuro: Mental Status: Follows commands.   Cranial Nerves: Alert, awake. Oriented to person, place but not year. Identified her children. PERRL. EOMI. No visual field cut. No droop. Tongue midline. Motor: no drift in right leg and b/l arms. Slight drift in left leg.  Sensory:  normal. Cerebellar: no ataxia.  Gait: not tested.    ASSESSMENT/PLAN Ms. Gwenneth Whiteman is a 89 y.o. female with history of atrial fibrillation not on anticoagulation, aortic stenosis status post TAVR, CAD, CKD stage III, depression, breast cancer, hyperlipidemia, hypertension, hypothyroidism and arthritis who was last seen well by her caregiver at 1500.  Patient had gone to the bathroom and then began to complain of chest pain.  EMS was called, and when EMS arrived, patient developed dysarthria, left-sided weakness, right gaze deviation and left-sided neglect.  Head CT in emergency department showed no acute abnormalities with aspects of 10.  On CTA, right M1 occlusion was noted.  Discussion was had with patient's son, and consent was obtained for TNK administration and mechanical thrombectomy.  Patient was made a DNR per her previously  stated wishes as verbalized by her son.  TNK was administered at 1618 with a short delay due to blood pressure being out of range, but this resolved with administration of labetalol. Patient was started on Cleviprex as her blood pressure began to rise after TNK administration.   LKW: 1500 TNK given?:  Yes, at 1618 after discussion with patient's son, short delay due to blood pressure being out of range IR Thrombectomy?  Yes, patient taken to IR for thrombectomy of right M1 at 1630 Modified Rankin Scale: 3-Moderate disability-requires help but walks WITHOUT assistance  Acute ischemic stroke due to right MCA M1 occlusion in patient with atrial fibrillation not on anticoagulation   Code Stroke CT head: No acute intracranial process. 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 ASPECTS is 10. Repeat CT head post-24hours: CTA head & neck: Occlusion of the distal right M1. 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 Severe stenosis in the mid left V4, moderate stenosis in the mid to distal right V4, and mild stenosis in the distal left ACA. 70% stenosis in  the proximal left subclavian artery and 80% stenosis in the proximal right subclavian artery. No other hemodynamically significant stenosis in the neck Post IR CT: Negative hemorrhage MRI scattered right MCA infarcts with chronic microhemorrages noted.   2D Echo: LVEF 45-50%, Global Hypokinesis, Severe Concentric LVH Mildly reduced RVSF Small Pericardial Effusion Present Trivial MVR  Atrial Fibrillation- will start eliquis today LDL 135 HgbA1c 4.6 VTE prophylaxis - SCDs    Diet   Diet regular Room service appropriate? Yes with Assist; Fluid consistency: Thin   No antithrombotic prior to admission, now on Eliquis daily Therapy recommendations: CIR Disposition:  pending  Hypertension Home meds:  norvasc, clonidine; on hold SBP goal 120-140 for first 24 hours post-thrombectomy Long-term BP goal normotensive  Hyperlipidemia Home meds:  lipitor '10mg'$  LDL 135, goal < 70 Increased to high-intensity, Lipitor '80mg'$  Continue statin at discharge   Other Stroke Risk Factors Advanced Age >/= 56  Family hx stroke (Mother) Coronary artery disease  Other Active Problems Recent COVID Positive December 2023 Severe AS s/p TAVR AKI on CKD stage III Hypothyroidism  Hospital day # 3   Patient seen and examined by NP/APP with MD. MD to update note as needed.   Janine Ores, DNP, FNP-BC [facility] Pager: [phone]  ATTENDING ATTESTATION:   89 year old with history of atrial fibrillation not on anticoagulation with right MCA stroke status post TNK and thrombectomy of right MCA.  Brain MRI shows right temporal lobe, corpus callosum, right corona radiata and left centrum semi ovale likely cardio embolic. Exam is stable.  Out of the ICU. Doing well.  Eliquis started today. Family and pt aware of risks of bleeding. Low Hg. Will need to monitor. Start ion supplementation.    Rehab pending.  To contact Stroke Continuity provider, please refer to [email or web address] After hours,  contact General Neurology

## 2022-04-19 NOTE — PMR Pre-admission (Signed)
PMR Admission Coordinator Pre-Admission Assessment  Patient: [name] is an 89 y.o., female MRN: [id number] DOB: 1925/09/27 Height: '4\' 11"'$  (149.9 cm) Weight: 64.6 kg  Insurance Information HMO:     PPO:      PCP:      IPA:      80/20:      OTHER:  PRIMARY: medicare a/b      Policy#: [id number]      Subscriber: pt CM Name:       Phone#:      Fax#:  Pre-Cert#:       Employer:  Benefits:  Phone #:      Name:  Eff. Date: A 06/13/92, B 07/13/92     Deduct: $1497      Out of Pocket Max: n/a      Life Max: n/a CIR: 100%      SNF: 20 full days  Outpatient: 80%     Co-Pay: 20% Home Health: 100%      Co-Pay:  DME: 80%     Co-Pay: 20% Providers:  [employer] [place]      Policy#: [id number]     Phone#: [phone]  Financial Counselor:       Phone#:   The "Data Collection Information Summary" for patients in Inpatient Rehabilitation Facilities with attached "Privacy Act [place] Records" was provided and verbally reviewed with: Family  Emergency Contact Information Contact Information     Name Relation Home Work Mobile   [place] Daughter [phone]  [phone]   Markeia, Harkless   [phone]       Current Medical History  Patient Admitting Diagnosis: CVA   History of Present Illness: Pt is a 89 y/o female wit hPMH of afib (not on AC), aortic stenosis s/p TAVR, CAD, CKD stage III, depression, breast cancer, HTN, hypothyroidism, anxiety, and arthritis admitted to [facility] on 04/16/22 with c/o L sided weakness, L neglect, R gaze, dysarthria, and chest pain.  On ED arrival, BP 177/102, O2 99% on room air.  Labs unremarkable except for glucose 176, creatinine 1.61 (baseline 1.1 in June), and potassium 3.3.  CT head negative, CTA head/neck showed LVO in right MCA and mild stenosis in L ACA.  TNK was administered and pt underwent thrombectomy per IR.  Tight BP control with cleviprex.  F/U CT showed no hemorrhage.  MRI showed R MCA infarcts with chronic  micro hemorrhages.  2D echo showed mild pericardial effusion and LVEF 45-50%.  Recommendations for eliquis which started on 2/5.  Therapy ongoing and pt recommended for CIR due to functional decline.  Complete NIHSS TOTAL: 3  [facility]'s [facility] record from [name] has been reviewed by the rehabilitation admission coordinator and physician.  Past Medical History  Past Medical History:  Diagnosis Date   Abnormal gait 06/24/2015   Last Assessment & Plan:   Relevant Hx:  Course:  Daily Update:  Today's Plan:uses her cane for this and no recent falls     Electronically signed by: Mayer Camel, NP  07/23/15 1529   Acute cough 08/25/2011   Acute kidney injury superimposed on chronic kidney disease ([place]) 10/18/2020   Anemia due to stage 3 chronic kidney disease ([place]) 06/24/2015   Last Assessment & Plan: Formatting of this note might be different from the original. Relevant Hx: Course: Daily Update: Today's Plan:update her CMP for her Electronically signed by: Mayer Camel, NP 07/23/15 1528 Formatting of this note might be different from the original. Last Assessment & Plan:  Relevant Hx: Course: Daily Update: Today's Plan:update her CMP for her Electronically    Anxiety 06/24/2015   Last Assessment & Plan:   Relevant Hx:  Course:  Daily Update:  Today's Plan:this is stable for her at this time     Electronically signed by: Mayer Camel, NP  07/23/15 1529   Aortic stenosis, severe    a. 07/2017: s/p TAVR w/ an Edwards Sapien 3 THV (size 26 mm, model # [id number], serial # [id number])   Atherosclerosis of coronary artery 06/24/2015   Formatting of this note might be different from the original. Ostial RCA DES, 2010 Last Assessment & Plan: stable Last Assessment & Plan: Relevant Hx: Course: Daily Update: Today's Plan:she has felt she was stable from this and she has cardiologist she has followed with . Electronically signed by: Mayer Camel, NP 07/23/15  1523 Ostial RCA DES, 2010 Last Assessment & Plan: stable   Atrial fibrillation ([place]) 03/19/2022   Bilateral hearing loss 06/25/2015   Bilateral sensorineural hearing loss 12/26/2020   Breast cancer, right breast ([place]) 03/24/2008   CAD (coronary artery disease)    a. 2010: s/p stent to RCA   Chronic diastolic (congestive) heart failure ([place]) 08/04/2017   Chronic kidney disease, stage III (moderate) (HCC)    Chronic recurrent major depressive disorder ([place]) 06/24/2015   Last Assessment & Plan:   Relevant Hx:  Course:  Daily Update:  Today's Plan:this is stable for her     Electronically signed by: Mayer Camel, NP  07/23/15 1530   Chronic sinusitis    Coronary artery disease involving native coronary artery of native heart with angina pectoris ([place]) 06/24/2015   Formatting of this note might be different from the original. Overview: Ostial RCA DES, 2010 Last Assessment & Plan: stable Last Assessment & Plan: Formatting of this note might be different from the original. Relevant Hx: Course: Daily Update: Today's Plan:she has felt she was stable from this and she has cardiologist she has followed with . Electronically signed by: Mayer Camel, N   Coronary artery disease involving native coronary artery of native heart without angina pectoris    Ostial RCA DES, 2010   Decreased hearing of both ears 06/25/2015   Deficiency anemia 03/11/2021   Dehydration 10/19/2020   Depressive disorder    Dizziness 08/14/2018   [place] of macula of both eyes 09/12/2018   Ductal carcinoma in situ of breast 06/23/2007   Encounter for long-term current use of high risk medication 06/24/2015   Epiretinal membrane (ERM) of left eye 09/12/2018   Essential hypertension, benign 02/16/2013   Gastro-esophageal reflux disease with esophagitis 06/24/2015   Last Assessment & Plan:   Relevant Hx:  Course:  Daily Update:  Today's Plan:she is stable from this      Electronically signed by: Mayer Camel, NP  07/23/15 1526   Gastroesophageal reflux disease without esophagitis 12/27/2016   History of breast cancer    a. s/p lumpectomies and XRT   Hyperlipemia    Hyperlipidemia 02/16/2013   Hypertension    Hypothyroid    Hypothyroidism, acquired 06/24/2015   Last Assessment & Plan:   Relevant Hx:  Course:  Daily Update:  Today's Plan:update her TSH for her today     Electronically signed by: Mayer Camel, NP  07/23/15 1526   Incisional hernia, without obstruction or gangrene 02/24/2019   Insomnia    Intermediate stage nonexudative age-related macular degeneration of both eyes 01/19/2021   Irritation of oral  cavity 11/23/2019   Malaise and fatigue 06/24/2015   Last Assessment & Plan:   Relevant Hx:  Course:  Daily Update:  Today's Plan:she is up and down with her energy and she feels that for the most part she has been stable     Electronically signed by: Mayer Camel, NP  07/23/15 1533   Malignant neoplasm of central portion of left female breast ([place]) 06/24/2015   Last Assessment & Plan:   Relevant Hx:  Course:  Daily Update:  Today's Plan:she is now receiving injections once a month for this and is hopeful will keep this at bay as this has been recurrent     Electronically signed by: Mayer Camel, NP  07/23/15 1530   Melena 03/19/2022   Memory change 07/23/2015   Last Assessment & Plan:   Relevant Hx:  Course:  Daily Update:  Today's Plan:she is going to take the aricept at supper     Electronically signed by: Mayer Camel, NP  07/23/15 1532   Memory loss    Multiple thyroid nodules 02/09/2012   Osteoarthritis    Osteoporosis 03/24/2020   Other allergic rhinitis 12/27/2016   Plantar fat pad atrophy 01/19/2016   Posterior vitreous detachment of left eye 09/12/2018   Primary insomnia 06/24/2015   Last Assessment & Plan:   Relevant Hx:  Course:  Daily Update:  Today's Plan:she feels that this is stable for her at  this time     Electronically signed by: Mayer Camel, NP  07/23/15 1531   Primary localized osteoarthrosis, ankle and foot 01/15/2016   Primary osteoarthritis involving multiple joints 06/24/2015   Last Assessment & Plan:   Relevant Hx:  Course:  Daily Update:  Today's Plan:she is stable from her joint though she has discomfort if she overexerts.     Electronically signed by: Mayer Camel, NP  07/23/15 1528   Reflux esophagitis    S/P TAVR (transcatheter aortic valve replacement) 08/02/2017   Severe aortic stenosis 02/16/2013   Physician Review  Conclusions: 1. Mild concentric left ventricular hypertrophy.  2. Left ventricular ejection fraction estimated by 2D at 60-65 percent.  3. There were no regional wall motion abnormalities.  4. Mild mitral annular calcification.  5. Trace mitral valve regurgitation.  6. Trivial tricuspid regurgitation.  7. Moderate increased thickness and calcification of the trileaflet aortic val   Stage III chronic kidney disease ([place]) 06/24/2015   Last Assessment & Plan:   Relevant Hx:  Course:  Daily Update:  Today's Plan:update her CMP for her     Electronically signed by: Mayer Camel, NP  07/23/15 1528   Thyroid nodule 06/24/2015   Vitreous membranes and strands 01/19/2021   PPV on 02/11/2021 OS    Has the patient had major surgery during 100 days prior to admission? No  Family History   family history includes Breast cancer in her sister; Heart disease in her father and maternal grandfather; Stroke in her mother.  Current Medications  Current Facility-Administered Medications:    acetaminophen (TYLENOL) tablet 650 mg, 650 mg, Oral, Q4H PRN, 650 mg at 04/19/22 0425 **OR** acetaminophen (TYLENOL) 160 MG/5ML solution 650 mg, 650 mg, Per Tube, Q4H PRN, 650 mg at 04/19/22 1045 **OR** acetaminophen (TYLENOL) suppository 650 mg, 650 mg, Rectal, Q4H PRN, Deveshwar, Sanjeev, MD   amLODipine (NORVASC) tablet 2.5 mg, 2.5 mg, Oral,  BID, Bhagat, Srishti L, MD, 2.5 mg at 04/20/22 0859   apixaban (ELIQUIS) tablet 5 mg, 5 mg, Oral,  BID, Charlean Merl, [place], NP, 5 mg at 04/20/22 1497   aspirin tablet 325 mg, 325 mg, Oral, Once, Lovey Newcomer C, NP   atorvastatin (LIPITOR) tablet 80 mg, 80 mg, Oral, Daily, Thressa Sheller, Erin C, NP, 80 mg at 04/20/22 0859   buPROPion (WELLBUTRIN XL) 24 hr tablet 300 mg, 300 mg, Oral, q morning, Thressa Sheller, Erin C, NP, 300 mg at 04/20/22 0858   busPIRone (BUSPAR) tablet 5 mg, 5 mg, Oral, TID PRN, Lovey Newcomer C, NP, 5 mg at 04/19/22 2129   Chlorhexidine Gluconate Cloth 2 % PADS 6 each, 6 each, Topical, Daily, Candee Furbish, MD, 6 each at 04/19/22 1046   docusate sodium (COLACE) capsule 100 mg, 100 mg, Oral, BID, [place] Crane M, PA-C, 100 mg at 04/20/22 1017   ferrous sulfate tablet 325 mg, 325 mg, Oral, BID WC, Palikh, Gaurang M, MD, 325 mg at 04/20/22 0900   furosemide (LASIX) tablet 20 mg, 20 mg, Oral, BID, Shafer, Devon, NP, 20 mg at 04/20/22 0859   melatonin tablet 3 mg, 3 mg, Oral, QHS PRN, Bhagat, Srishti L, MD, 3 mg at 04/20/22 0232   metoprolol tartrate (LOPRESSOR) tablet 12.5 mg, 12.5 mg, Oral, Q8H, Bhagat, Srishti L, MD, 12.5 mg at 04/20/22 1247   pantoprazole (PROTONIX) EC tablet 40 mg, 40 mg, Oral, Daily, Palikh, Gaurang M, MD, 40 mg at 04/20/22 1248   polyethylene glycol (MIRALAX / GLYCOLAX) packet 17 g, 17 g, Per Tube, Daily, [place] Crane M, PA-C, 17 g at 04/20/22 0263  Patients Current Diet:  Diet Order             Diet regular Room service appropriate? Yes with Assist; Fluid consistency: Thin  Diet effective now                   Precautions / Restrictions Precautions Precautions: Fall Precaution Comments: NRB Restrictions Weight Bearing Restrictions: No   Has the patient had 2 or more falls or a fall with injury in the past year? No  Prior Activity Level Household: limited household mobility at baseline, using rollator, supervision for all mobility and ADLs at  baseline  Prior Functional Level Self Care: Did the patient need help bathing, dressing, using the toilet or eating? Needed some help  Indoor Mobility: Did the patient need assistance with walking from room to room (with or without device)? Needed some help  Stairs: Did the patient need assistance with internal or external stairs (with or without device)? Needed some help  Functional Cognition: Did the patient need help planning regular tasks such as shopping or remembering to take medications? Needed some help  Patient Information Are you of Hispanic, Latino/a,or Spanish origin?: X. Patient unable to respond, A. No, not of Hispanic, Latino/a, or Spanish origin (son) What is your race?: X. Patient unable to respond, A. White (per son) Do you need or want an interpreter to communicate with a doctor or health care staff?: 0. No  Patient's Response To:  Health Literacy and Transportation Is the patient able to respond to health literacy and transportation needs?: No Health Literacy - How often do you need to have someone help you when you read instructions, pamphlets, or other written material from your doctor or pharmacy?: Patient unable to respond In the past 12 months, has lack of transportation kept you from medical appointments or from getting medications?: No (per son) In the past 12 months, has lack of transportation kept you from meetings, work, or from getting things needed for daily living?:  No (per son)  Development worker, international aid / [place] Devices/Equipment: Environmental consultant (specify type) (rolling) Home Equipment: Rollator (4 wheels)  Prior Device Use: Indicate devices/aids used by the patient prior to current illness, exacerbation or injury? Walker  Current Functional Level Cognition  Arousal/Alertness: Awake/alert Overall Cognitive Status: Impaired/Different from baseline Orientation Level: Oriented to person, Oriented to place, Oriented to situation, Disoriented to  time Following Commands: Follows one step commands with increased time Safety/Judgement: Decreased awareness of safety, Decreased awareness of deficits General Comments: Impacted by [facility] Attention: Focused, Sustained Focused Attention: Appears intact Sustained Attention: Impaired Sustained Attention Impairment: Verbal basic Memory: Impaired Memory Impairment: Storage deficit, Decreased short term memory Decreased Short Term Memory: Verbal basic Awareness: Impaired Awareness Impairment: Emergent impairment Problem Solving: Impaired Problem Solving Impairment: Verbal complex Executive Function: Reasoning Reasoning: Impaired    Extremity Assessment (includes Sensation/Coordination)  Upper Extremity Assessment: Generalized weakness  Lower Extremity Assessment: Generalized weakness (no bilat knee buckling in standing, grossly minimally 3/5 however needed assist with advance L LE)    ADLs  Overall ADL's : Needs assistance/impaired Eating/Feeding: Set up, Supervision/ safety, Sitting Eating/Feeding Details (indicate cue type and reason): She did ask her son to feed her after she had fed herself ~1/2 her omelet Grooming: Set up, Supervision/safety, Sitting Upper Body Bathing: Set up, Supervision/ safety, Sitting Lower Body Bathing: Total assistance Lower Body Bathing Details (indicate cue type and reason): Mod A +2 sit<>stand Upper Body Dressing : Moderate assistance, Sitting Lower Body Dressing: Total assistance Lower Body Dressing Details (indicate cue type and reason): Mod A +2 sit<>stand Toilet Transfer: Moderate assistance, +2 for physical assistance, Stand-pivot, Rolling walker (2 wheels) Toilet Transfer Details (indicate cue type and reason): simulated bed>recliner next to bed on right Toileting- Clothing Manipulation and Hygiene: Total assistance Toileting - Clothing Manipulation Details (indicate cue type and reason): Mod A +2 sit<>stand General ADL Comments: Educated son and  dtr in law in room about the options for AIR, SNF, HH    Mobility  Overal bed mobility: Needs Assistance Bed Mobility: Supine to Sit Rolling: Min assist Sidelying to sit: Mod assist, +2 for physical assistance, HOB elevated General bed mobility comments: OOB in chair    Transfers  Overall transfer level: Needs assistance Equipment used: Rolling walker (2 wheels) Transfers: Sit to/from Stand, Bed to chair/wheelchair/BSC Sit to Stand: Mod assist Bed to/from chair/wheelchair/BSC transfer type:: Step pivot Step pivot transfers: Mod assist, +2 physical assistance General transfer comment: ModA to boost from chair x 2    Ambulation / Gait / Stairs / Wheelchair Mobility  Ambulation/Gait Ambulation/Gait assistance: Min assist, +2 safety/equipment Gait Distance (Feet): 40 Feet (20", 20") Assistive device: Rolling walker (2 wheels) Gait Pattern/deviations: Decreased stride length, Step-to pattern, Trunk flexed General Gait Details: Min cues for stepping initiation, sequencing, L quad activation. MinA for balance and chair follow utilized. 1 rest break in between bouts Gait velocity: decreased Gait velocity interpretation: <1.8 ft/sec, indicate of risk for recurrent falls    Posture / Balance Balance Overall balance assessment: Needs assistance Sitting-balance support: Feet supported, No upper extremity supported Sitting balance-Leahy Scale: Fair Standing balance support: Bilateral upper extremity supported Standing balance-Leahy Scale: Poor Standing balance comment: reliant on RW    Special needs/care consideration Oxygen n/a   Previous Home Environment (from acute therapy documentation) Living Arrangements: Alone Available Help at Discharge: Personal care attendant, Available 24 hours/day Type of Home: Independent living facility Home Layout: One level Home Access: Level entry Bathroom Shower/Tub: Walk-in shower, Architectural technologist:  Handicapped height  Discharge Living  Setting Plans for Discharge Living Setting: Patient's home, Other (Comment) (ILF) Type of Home at Discharge: [place] Name at Discharge: [facility] Discharge Home Layout: One level Discharge Home Access: Level entry Discharge Bathroom Shower/Tub: Walk-in shower Discharge Bathroom Toilet: Handicapped height Discharge Bathroom Accessibility: Yes How Accessible: Accessible via walker Does the patient have any problems obtaining your medications?: No  Social/Family/Support Systems Anticipated Caregiver: hired caregivers will provide 24/7 care as at baseline, spoke to [place] (son) at bedside Anticipated Caregiver's Contact Information: Harrington Challenger [phone] Ability/Limitations of Caregiver: n/a Caregiver Availability: 24/7 Discharge Plan Discussed with Primary Caregiver: Yes Is Caregiver In Agreement with Plan?: Yes Does Caregiver/Family have Issues with Lodging/Transportation while Pt is in Rehab?: No  Goals Patient/Family Goal for Rehab: PT/OT/SLP min assist to supervision Expected length of stay: 12-14 days Additional Information: Plan to return to [facility] with 24/7 hired caregivers as is her baseline.  Verified Medicare, pt has not used any SNF days Pt/Family Agrees to Admission and willing to participate: Yes Program Orientation Provided & Reviewed with Pt/Caregiver Including Roles  & Responsibilities: Yes  Decrease burden of Care through IP rehab admission: n/a  Possible need for SNF placement upon discharge: not anticipated.  Pt has 24/7 hired caregivers at baseline and pt's son, Harrington Challenger, confirms the plan to continue with this.   Patient Condition: I have reviewed medical records from [facility], spoken with  [facility] team , and patient and son. I met with patient at the bedside for inpatient rehabilitation assessment.  Patient will benefit from ongoing PT, OT, and SLP, can actively participate in 3 hours of therapy a day 5 days of the week, and can make  measurable gains during the admission.  Patient will also benefit from the coordinated team approach during an Inpatient Acute Rehabilitation admission.  The patient will receive intensive therapy as well as Rehabilitation physician, nursing, social worker, and care management interventions.  Due to bladder management, bowel management, safety, skin/wound care, disease management, medication administration, pain management, and patient education the patient requires 24 hour a day rehabilitation nursing.  The patient is currently mod assist +2 with mobility and basic ADLs.  Discharge setting and therapy post discharge at home with home health is anticipated.  Patient has agreed to participate in the Acute Inpatient Rehabilitation Program and will admit today.  Preadmission Screen Completed By:  Michel Santee, PT, DPT  04/20/2022 12:58 PM ______________________________________________________________________   Discussed status with Dr. Dagoberto Ligas on 04/20/22  at 12:58 PM  and received approval for admission today.  Admission Coordinator:  Michel Santee, PT, DPT time 12:58 PM Sudie Grumbling 04/20/22    Assessment/Plan: Diagnosis: Does the need for close, 24 hr/day Medical supervision in concert with the patient's rehab needs make it unreasonable for this patient to be served in a less intensive setting? Yes Co-Morbidities requiring supervision/potential complications: Afib, L hemiparesis due to R M1 occlusion s/p thrombectomy with R brain mutiple strokes; urinary retention; PJA2N; depression/anxiety Due to bladder management, bowel management, safety, skin/wound care, disease management, medication administration, and patient education, does the patient require 24 hr/day rehab nursing? Yes Does the patient require coordinated care of a physician, rehab nurse, PT, OT, and SLP to address physical and functional deficits in the context of the above medical diagnosis(es)? Yes Addressing deficits in the following  areas: balance, endurance, locomotion, strength, transferring, bowel/bladder control, bathing, dressing, feeding, grooming, toileting, cognition, and swallowing Can the patient actively participate in an intensive therapy  program of at least 3 hrs of therapy 5 days a week? Yes The potential for patient to make measurable gains while on inpatient rehab is good Anticipated functional outcomes upon discharge from inpatient rehab: supervision and min assist PT, supervision and min assist OT, supervision SLP Estimated rehab length of stay to reach the above functional goals is: 12-14 days Anticipated discharge destination: Home 10. Overall Rehab/Functional Prognosis: good   MD Signature:

## 2022-04-19 NOTE — Progress Notes (Signed)
Mobility Specialist: Progress Note   04/19/22 1722  Mobility  Activity Transferred from chair to bed  Level of Assistance +2 (takes two people)  Assistive Device Other (Comment) (HHA)  Distance Ambulated (ft) 2 ft  Activity Response Tolerated well  Mobility Referral Yes  $Mobility charge 1 Mobility   Pt assisted back to bed per request. +2 modA HHA to stand and pivot to the bed with physical assist to weight shift and advance LLE. No c/o throughout. Pt back to bed with call bell and phone at her side. Bed alarm is on.   [place] Tirsa Gail Mobility Specialist Please contact via SecureChat or Rehab office at [phone]

## 2022-04-19 NOTE — Discharge Summary (Incomplete)
Stroke Discharge Summary  Patient ID: [name]   MRN: [id number]      DOB: [birth year]  Date of Admission: 04/16/2022 Date of Discharge: 04/20/2022  Attending Physician:  Egbert Garibaldi MD Consultant(s):   rehabilitation medicine Patient's PCP:  Seward Meth, NP  Discharge Diagnoses: Acute ischemic stroke due to right MCA M1 occlusion in patient with atrial fibrillation not on anticoagulation  Principal Problem:   Status post stroke Active Problems:   Acute ischemic stroke [facility])   Middle cerebral artery embolism, right   Medications to be continued on Rehab Allergies as of 04/20/2022       Reactions   Latex Hives, Swelling   Other reaction(s): Unknown   Lisinopril Cough   Other reaction(s): Unknown   Povidone Iodine Other (See Comments)   Blisters Other reaction(s): Unknown   Povidone-iodine    Other Reaction(s): Unknown        Medication List     STOP taking these medications    cloNIDine 0.1 MG tablet Commonly known as: CATAPRES   diclofenac 25 MG EC tablet Commonly known as: VOLTAREN       TAKE these medications    acetaminophen 325 MG tablet Commonly known as: TYLENOL Take 2 tablets (650 mg total) by mouth every 4 (four) hours as needed for mild pain (or temp > 37.5 C (99.5 F)).   albuterol 108 (90 Base) MCG/ACT inhaler Commonly known as: VENTOLIN HFA Inhale 2 puffs into the lungs every 4 (four) hours as needed for wheezing or shortness of breath.   ALIGN PO Take 1 capsule by mouth daily.   amLODipine 2.5 MG tablet Commonly known as: NORVASC Take 2.5 mg by mouth 2 (two) times daily.   apixaban 5 MG Tabs tablet Commonly known as: ELIQUIS Take 1 tablet (5 mg total) by mouth 2 (two) times daily.   atorvastatin 80 MG tablet Commonly known as: LIPITOR Take 1 tablet (80 mg total) by mouth daily. Start taking on: April 21, 2022   buPROPion 300 MG 24 hr tablet Commonly known as: WELLBUTRIN XL Take 300 mg by mouth every  morning.   busPIRone 5 MG tablet Commonly known as: BUSPAR Take 1 tablet (5 mg total) by mouth 3 (three) times daily as needed (anxiety).   diphenoxylate-atropine 2.5-0.025 MG tablet Commonly known as: LOMOTIL Take 1 tablet by mouth 4 (four) times daily as needed for diarrhea or loose stools.   docusate sodium 100 MG capsule Commonly known as: COLACE Take 1 capsule (100 mg total) by mouth 2 (two) times daily.   doxycycline 100 MG capsule Commonly known as: MONODOX Take 1 capsule (100 mg total) by mouth 2 (two) times daily.   Eszopiclone 3 MG Tabs Take 3 mg by mouth at bedtime.   FeroSul 325 (65 FE) MG tablet Generic drug: ferrous sulfate Take 325 mg by mouth 2 (two) times daily.   fluticasone 50 MCG/ACT nasal spray Commonly known as: FLONASE Place 1 spray into both nostrils in the morning and at bedtime.   furosemide 20 MG tablet Commonly known as: LASIX Take 1 tablet (20 mg total) by mouth 2 (two) times daily.   levothyroxine 50 MCG tablet Commonly known as: SYNTHROID Take 50 mcg by mouth daily before breakfast.   Magnesium Oxide -Mg Supplement 500 MG Caps Take 500 mg by mouth daily.   melatonin 3 MG Tabs tablet Take 1 tablet (3 mg total) by mouth at bedtime as needed.   metoprolol succinate 25 MG  24 hr tablet Commonly known as: TOPROL-XL Take 25 mg by mouth 2 (two) times daily.   pantoprazole 40 MG tablet Commonly known as: PROTONIX Take 1 tablet (40 mg total) by mouth daily. Start taking on: April 21, 2022   polyethylene glycol 17 g packet Commonly known as: MIRALAX / GLYCOLAX Place 17 g into feeding tube daily. Start taking on: April 21, 2022        LABORATORY STUDIES CBC    Component Value Date/Time   WBC 9.1 04/20/2022 0618   RBC 3.47 (L) 04/20/2022 0618   HGB 9.3 (L) 04/20/2022 0618   HGB 11.8 05/23/2019 1236   HCT 30.4 (L) 04/20/2022 0618   HCT 35.6 05/23/2019 1236   PLT 227 04/20/2022 0618   PLT 253 05/23/2019 1236   MCV 87.6  04/20/2022 0618   MCV 91 12/11/2020 0000   MCH 26.8 04/20/2022 0618   MCHC 30.6 04/20/2022 0618   RDW 21.8 (H) 04/20/2022 0618   RDW 12.9 05/23/2019 1236   LYMPHSABS 1.4 04/20/2022 0618   MONOABS 0.8 04/20/2022 0618   EOSABS 0.1 04/20/2022 0618   BASOSABS 0.1 04/20/2022 0618   CMP    Component Value Date/Time   NA 135 04/20/2022 0618   NA 136 (A) 09/07/2021 0000   K 3.4 (L) 04/20/2022 0618   CL 104 04/20/2022 0618   CO2 25 04/20/2022 0618   GLUCOSE 107 (H) 04/20/2022 0618   BUN 12 04/20/2022 0618   BUN 18 09/07/2021 0000   CREATININE 1.03 (H) 04/20/2022 0618   CALCIUM 8.8 (L) 04/20/2022 0618   PROT 6.2 (L) 04/16/2022 1616   PROT 6.6 05/23/2019 1236   ALBUMIN 3.4 (L) 04/16/2022 1616   ALBUMIN 4.6 05/23/2019 1236   AST 31 04/16/2022 1616   ALT 22 04/16/2022 1616   ALKPHOS 53 04/16/2022 1616   BILITOT 0.7 04/16/2022 1616   BILITOT 0.6 05/23/2019 1236   GFRNONAA 50 (L) 04/20/2022 0618   GFRAA 48 (L) 05/23/2019 1236   COAGS Lab Results  Component Value Date   INR 1.1 04/16/2022   INR 1.07 07/29/2017   INR 1.0 06/30/2017   Lipid Panel    Component Value Date/Time   CHOL 210 (H) 04/17/2022 0554   TRIG 160 (H) 04/18/2022 0441   HDL 55 04/17/2022 0554   CHOLHDL 3.8 04/17/2022 0554   VLDL 20 04/17/2022 0554   LDLCALC 135 (H) 04/17/2022 0554   HgbA1C  Lab Results  Component Value Date   HGBA1C 4.6 (L) 04/17/2022   Urinalysis    Component Value Date/Time   COLORURINE STRAW (A) 08/02/2017 1042   APPEARANCEUR CLEAR 08/02/2017 1042   LABSPEC 1.008 08/02/2017 1042   PHURINE 6.0 08/02/2017 1042   GLUCOSEU NEGATIVE 08/02/2017 1042   HGBUR SMALL (A) 08/02/2017 1042   BILIRUBINUR NEGATIVE 08/02/2017 1042   KETONESUR NEGATIVE 08/02/2017 1042   PROTEINUR NEGATIVE 08/02/2017 1042   NITRITE NEGATIVE 08/02/2017 1042   LEUKOCYTESUR MODERATE (A) 08/02/2017 1042   Urine Drug Screen No results found for: "LABOPIA", "COCAINSCRNUR", "LABBENZ", "AMPHETMU", "THCU", "LABBARB"   Alcohol Level    Component Value Date/Time   ETH <10 04/16/2022 1616     SIGNIFICANT DIAGNOSTIC STUDIES IR PERCUTANEOUS ART THROMBECTOMY/INFUSION INTRACRANIAL INC DIAG ANGIO  Result Date: 04/20/2022 INDICATION: New onset right gaze deviation, left-sided hemiplegia. Occluded right middle cerebral artery on CT angiogram of head and neck. EXAM: 1. EMERGENT LARGE VESSEL OCCLUSION THROMBOLYSIS (anterior CIRCULATION) COMPARISON:  CT angiogram of the head and neck of April 16, 2022. MEDICATIONS:  Ancef 2 g IV antibiotic was administered within 1 hour of the procedure. ANESTHESIA/SEDATION: General anesthesia. CONTRAST:  Omnipaque 300 65 mL. FLUOROSCOPY TIME:  Fluoroscopy Time: 10 minutes 12 seconds (481 mGy). COMPLICATIONS: None immediate. TECHNIQUE: Following a full explanation of the procedure along with the potential associated complications, an informed witnessed consent was obtained. The risks of intracranial hemorrhage of 10%, worsening neurological deficit, ventilator dependency, death and inability to revascularize were all reviewed in detail with the patient's family. The patient was then put under general anesthesia by the Department of Anesthesiology at [facility]. The right groin was prepped and draped in the usual sterile fashion. Thereafter using modified Seldinger technique, transfemoral access into the right common femoral artery was obtained without difficulty. Over a 0.035 inch guidewire an 8 [place] Pinnacle sheath was inserted. Through this, and also over a 0.035 inch guidewire a combination of a 5.5 French 125 cm Simmons 2 support catheter inside of an 087 balloon guide catheter was advanced to the aortic arch region, and selectively positioned in the right common carotid artery and then distally in the mid third of the cervical right ICA. FINDINGS: The right common carotid arteriogram demonstrates the right external carotid artery and its major branches to be widely patent. The  right internal carotid artery at the bulb demonstrates mild stenosis associated with a small ulcerated plaque along the anterior wall. Moderate tortuosity of the proximal right ICA is evident. More distally, patency is maintained of the right internal carotid artery in the petrous, cavernous and supraclinoid right ICA. A right posterior communicating artery with moderate focal areas of smooth irregularity and narrowing is seen opacifying the right posterior cerebral artery distribution. The right middle cerebral artery demonstrates pre occlusive filling defect in its mid third extending to near complete occlusion of the inferior division, and the superior division. Right anterior cerebral artery opacifies with multiple focal areas of intracranial arteriosclerosis. PROCEDURE: Through the balloon guide catheter in the mid cervical right ICA, 070 FreeClimb aspiration catheter with the intra support microcatheter was advanced without difficulty to the supraclinoid right ICA. Access was obtained with the intra support microcatheter into the right middle cerebral artery distal M1 and then inferior division followed by the advancement of the 070 FreeClimb aspiration catheter to just inside the origin of the inferior division. The inner support catheter was removed. Aspiration was then applied at the hub of the balloon guide catheter with proximal flow arrest, and also at the hub of the 070 cm aspiration catheter for approximately a minute and a half. Thereafter, the 070 aspiration catheter was removed. Following reversal of flow arrest, a control arteriogram performed through the balloon guide catheter demonstrated complete revascularization of the right middle cerebral artery achieving a TICI 3 revascularization. Demonstrated are moderate focal areas of intracranial arteriosclerosis of the right middle cerebral artery distribution. A control arteriogram performed through the balloon guide catheter in the right common  carotid artery demonstrates the right external carotid artery and its branches to be unchanged. The right internal carotid artery demonstrated patency intracranially and extra cranially with a TICI 3 revascularization of the right middle cerebral artery being maintained. The balloon guide catheter was removed. Manual compression with quick clot was held at the right groin puncture site due to the failure of the 8 French Angio-Seal closure device. Distal pulses remained Dopplerable in both feet unchanged. CT of the brain demonstrated no evidence of intracranial hemorrhage. The patient was left intubated due to her medical condition. She was then  transferred to the neuro ICU for post revascularization care. IMPRESSION: Status post complete revascularization of occluded right middle cerebral artery mid to distal M1 segment with 1 pass with the 070 aspiration catheter and proximal flow arrest achieving a TICI 3 revascularization. PLAN: Follow-up as per referring MD. Electronically Signed   By: Luanne Bras M.D.   On: 04/20/2022 08:17   IR CT Head Ltd  Result Date: 04/20/2022 INDICATION: New onset right gaze deviation, left-sided hemiplegia. Occluded right middle cerebral artery on CT angiogram of head and neck. EXAM: 1. EMERGENT LARGE VESSEL OCCLUSION THROMBOLYSIS (anterior CIRCULATION) COMPARISON:  CT angiogram of the head and neck of April 16, 2022. MEDICATIONS: Ancef 2 g IV antibiotic was administered within 1 hour of the procedure. ANESTHESIA/SEDATION: General anesthesia. CONTRAST:  Omnipaque 300 65 mL. FLUOROSCOPY TIME:  Fluoroscopy Time: 10 minutes 12 seconds (481 mGy). COMPLICATIONS: None immediate. TECHNIQUE: Following a full explanation of the procedure along with the potential associated complications, an informed witnessed consent was obtained. The risks of intracranial hemorrhage of 10%, worsening neurological deficit, ventilator dependency, death and inability to revascularize were all reviewed  in detail with the patient's family. The patient was then put under general anesthesia by the Department of Anesthesiology at [facility]. The right groin was prepped and draped in the usual sterile fashion. Thereafter using modified Seldinger technique, transfemoral access into the right common femoral artery was obtained without difficulty. Over a 0.035 inch guidewire an 8 [place] Pinnacle sheath was inserted. Through this, and also over a 0.035 inch guidewire a combination of a 5.5 French 125 cm Simmons 2 support catheter inside of an 087 balloon guide catheter was advanced to the aortic arch region, and selectively positioned in the right common carotid artery and then distally in the mid third of the cervical right ICA. FINDINGS: The right common carotid arteriogram demonstrates the right external carotid artery and its major branches to be widely patent. The right internal carotid artery at the bulb demonstrates mild stenosis associated with a small ulcerated plaque along the anterior wall. Moderate tortuosity of the proximal right ICA is evident. More distally, patency is maintained of the right internal carotid artery in the petrous, cavernous and supraclinoid right ICA. A right posterior communicating artery with moderate focal areas of smooth irregularity and narrowing is seen opacifying the right posterior cerebral artery distribution. The right middle cerebral artery demonstrates pre occlusive filling defect in its mid third extending to near complete occlusion of the inferior division, and the superior division. Right anterior cerebral artery opacifies with multiple focal areas of intracranial arteriosclerosis. PROCEDURE: Through the balloon guide catheter in the mid cervical right ICA, 070 FreeClimb aspiration catheter with the intra support microcatheter was advanced without difficulty to the supraclinoid right ICA. Access was obtained with the intra support microcatheter into the right middle  cerebral artery distal M1 and then inferior division followed by the advancement of the 070 FreeClimb aspiration catheter to just inside the origin of the inferior division. The inner support catheter was removed. Aspiration was then applied at the hub of the balloon guide catheter with proximal flow arrest, and also at the hub of the 070 cm aspiration catheter for approximately a minute and a half. Thereafter, the 070 aspiration catheter was removed. Following reversal of flow arrest, a control arteriogram performed through the balloon guide catheter demonstrated complete revascularization of the right middle cerebral artery achieving a TICI 3 revascularization. Demonstrated are moderate focal areas of intracranial arteriosclerosis of the right middle  cerebral artery distribution. A control arteriogram performed through the balloon guide catheter in the right common carotid artery demonstrates the right external carotid artery and its branches to be unchanged. The right internal carotid artery demonstrated patency intracranially and extra cranially with a TICI 3 revascularization of the right middle cerebral artery being maintained. The balloon guide catheter was removed. Manual compression with quick clot was held at the right groin puncture site due to the failure of the 8 French Angio-Seal closure device. Distal pulses remained Dopplerable in both feet unchanged. CT of the brain demonstrated no evidence of intracranial hemorrhage. The patient was left intubated due to her medical condition. She was then transferred to the neuro ICU for post revascularization care. IMPRESSION: Status post complete revascularization of occluded right middle cerebral artery mid to distal M1 segment with 1 pass with the 070 aspiration catheter and proximal flow arrest achieving a TICI 3 revascularization. PLAN: Follow-up as per referring MD. Electronically Signed   By: Luanne Bras M.D.   On: 04/20/2022 08:17   MR BRAIN WO  CONTRAST  Result Date: 04/17/2022 CLINICAL DATA:  Stroke follow-up EXAM: MRI HEAD WITHOUT CONTRAST TECHNIQUE: Multiplanar, multiecho pulse sequences of the brain and surrounding structures were obtained without intravenous contrast. COMPARISON:  CT Head/angio 04/16/22 FINDINGS: Brain: There are acute infarcts in the anterior right temporal lobe (series 2, image 21) and in the body of the corpus callosum (series 2, image 32). There is also an additional small region of acute infarct in the right corona radiata (series 2, image 31). There is a separate region of late acute to early subacute infarct in the left centrum semiovale (series 2, image 36). Sequela of severe chronic microvascular ischemic change with chronic infarcts in the right frontal lobe in the left cerebellum. There are scattered regions of susceptibility artifact on susceptibility weighted imaging, for example abutting the left occipital horn (series 7, image 48) in the centrum semiovale on the right (series 7, image 68) which appear dark on all of the obtained sequences. These may represent sites of microhemorrhages or small volume pneumocephalus. Vascular: Normal flow voids. Skull and upper cervical spine: Normal marrow signal. Sinuses/Orbits: Bilateral lens replacement. No mastoid effusion. Paranasal sinuses are notable for mucosal thickening in the left sphenoid sinus Other: None IMPRESSION: 1. Acute infarcts in the anterior right temporal lobe, body of the corpus callosum, right corona radiata, and left centrum semiovale. 2. Scattered regions of susceptibility artifact appear dark on all of the obtained sequences, which may represent sites of microhemorrhages or small volume pneumocephalus. No parenchymal hematoma. Consider further evalution with a CT of the head for further evaluation. These results will be called to the ordering clinician or representative by the Radiologist Assistant, and communication documented in the PACS or [employer].  Electronically Signed   By: Marin Roberts M.D.   On: 04/17/2022 16:50   ECHOCARDIOGRAM COMPLETE  Result Date: 04/17/2022    ECHOCARDIOGRAM REPORT   Patient Name:   [name] Date of Exam: 04/17/2022 Medical Rec #:  [id number]                 Height:       59.0 in Accession #:    [id number]                Weight:       142.4 lb Date of Birth:  01/16/1926  BSA:          1.597 m Patient Age:    66 years                  BP:           121/95 mmHg Patient Gender: F                         HR:           110 bpm. Exam Location:  Inpatient Procedure: 2D Echo, Cardiac Doppler and Color Doppler Indications:    I63.9 Stroke  History:        Patient has prior history of Echocardiogram examinations, most                 recent 03/02/2022. CKD and Stroke, Arrythmias:Atrial                 Fibrillation; Risk Factors:Hypertension and Dyslipidemia.  Sonographer:    Wilkie Aye RVT RCS Referring Phys: [id number] [place] E [place]  Sonographer Comments: Echo performed with patient supine and on artificial respirator. IMPRESSIONS  1. Consider infiltrative cardiomyopathy such as amyloid. Left ventricular ejection fraction, by estimation, is 45 to 50%. Left ventricular ejection fraction by PLAX is 47 %. The left ventricle has mildly decreased function. The left ventricle demonstrates global hypokinesis. There is severe concentric left ventricular hypertrophy. Left ventricular diastolic function could not be evaluated.  2. Right ventricular systolic function is mildly reduced. The right ventricular size is normal. There is normal pulmonary artery systolic pressure. The estimated right ventricular systolic pressure is 84.1 mmHg.  3. Left atrial size was moderately dilated.  4. Right atrial size was moderately dilated.  5. A small pericardial effusion is present. The pericardial effusion is circumferential. There is no evidence of cardiac tamponade.  6. The mitral valve is abnormal. Trivial mitral valve  regurgitation. Moderate mitral annular calcification.  7. The aortic valve is tricuspid. Aortic valve regurgitation is not visualized. No aortic stenosis is present.  8. The inferior vena cava is normal in size with greater than 50% respiratory variability, suggesting right atrial pressure of 3 mmHg.  9. Rhythm strip during this exam demonstrates atrial fibrillation. Comparison(s): Prior images unable to be directly viewed, comparison made by report only. Changes from prior study are noted. 03/02/2022: [facility] hospital) - LVEF 55-60%. FINDINGS  Left Ventricle: Consider infiltrative cardiomyopathy such as amyloid. Left ventricular ejection fraction, by estimation, is 45 to 50%. Left ventricular ejection fraction by PLAX is 47 %. The left ventricle has mildly decreased function. The left ventricle demonstrates global hypokinesis. The left ventricular internal cavity size was normal in size. There is severe concentric left ventricular hypertrophy. Left ventricular diastolic function could not be evaluated due to atrial fibrillation. Left ventricular diastolic function could not be evaluated. Right Ventricle: The right ventricular size is normal. No increase in right ventricular wall thickness. Right ventricular systolic function is mildly reduced. There is normal pulmonary artery systolic pressure. The tricuspid regurgitant velocity is 2.02 m/s, and with an assumed right atrial pressure of 3 mmHg, the estimated right ventricular systolic pressure is 32.4 mmHg. Left Atrium: Left atrial size was moderately dilated. Right Atrium: Right atrial size was moderately dilated. Pericardium: A small pericardial effusion is present. The pericardial effusion is circumferential. There is no evidence of cardiac tamponade. Mitral Valve: The mitral valve is abnormal. There is moderate calcification of the anterior and posterior mitral valve leaflet(s). Moderate  mitral annular calcification. Trivial mitral valve regurgitation.  Tricuspid Valve: The tricuspid valve is grossly normal. Tricuspid valve regurgitation is mild. Aortic Valve: The aortic valve is tricuspid. Aortic valve regurgitation is not visualized. No aortic stenosis is present. Aortic valve mean gradient measures 3.3 mmHg. Aortic valve peak gradient measures 6.1 mmHg. Aortic valve area, by VTI measures 1.29 cm. Pulmonic Valve: The pulmonic valve was grossly normal. Pulmonic valve regurgitation is trivial. Aorta: The aortic root and ascending aorta are structurally normal, with no evidence of dilitation. Venous: The inferior vena cava is normal in size with greater than 50% respiratory variability, suggesting right atrial pressure of 3 mmHg. IAS/Shunts: No atrial level shunt detected by color flow Doppler. EKG: Rhythm strip during this exam demonstrates atrial fibrillation.  LEFT VENTRICLE PLAX 2D LV EF:         Left ventricular ejection fraction by PLAX is 47 %. LVIDd:         2.70 cm LVIDs:         2.10 cm LV PW:         1.80 cm LV IVS:        1.90 cm LVOT diam:     1.40 cm LV SV:         27 LV SV Index:   17 LVOT Area:     1.54 cm  RIGHT VENTRICLE            IVC RV Basal diam:  3.70 cm    IVC diam: 0.90 cm RV S prime:     7.81 cm/s TAPSE (M-mode): 1.9 cm LEFT ATRIUM           Index        RIGHT ATRIUM           Index LA diam:      4.00 cm 2.51 cm/m   RA Area:     12.40 cm LA Vol (A2C): 68.6 ml 42.97 ml/m  RA Volume:   28.60 ml  17.91 ml/m  AORTIC VALVE AV Area (Vmax):    1.40 cm AV Area (Vmean):   1.34 cm AV Area (VTI):     1.29 cm AV Vmax:           123.00 cm/s AV Vmean:          84.767 cm/s AV VTI:            0.206 m AV Peak Grad:      6.1 mmHg AV Mean Grad:      3.3 mmHg LVOT Vmax:         111.57 cm/s LVOT Vmean:        73.633 cm/s LVOT VTI:          0.172 m LVOT/AV VTI ratio: 0.84  AORTA Ao Root diam: 2.30 cm MITRAL VALVE                TRICUSPID VALVE MV Area (PHT): 3.10 cm     TR Peak grad:   16.3 mmHg MV Decel Time: 244 msec     TR Vmax:        202.00 cm/s  MV E velocity: 152.00 cm/s MV A velocity: 54.55 cm/s   SHUNTS MV E/A ratio:  2.79         Systemic VTI:  0.17 m                             Systemic Diam: 1.40 cm Chrissie Noa  Hilty MD Electronically signed by Lyman Bishop MD Signature Date/Time: 04/17/2022/12:23:41 PM    Final    DG CHEST PORT 1 VIEW  Result Date: 04/16/2022 CLINICAL DATA:  Endotracheal tube EXAM: PORTABLE CHEST 1 VIEW COMPARISON:  03/02/2022 FINDINGS: Endotracheal tube with tip measuring 2.3 cm above the carina. Shallow inspiration. Mild cardiac enlargement. Cardiac valve prosthesis. Lungs are clear. No pleural effusions. No pneumothorax. Mediastinal contours appear intact. Calcified and tortuous aorta. Degenerative changes in the spine and shoulders. IMPRESSION: Endotracheal tube appears in satisfactory position. Cardiac enlargement. Lungs are clear. Electronically Signed   By: Lucienne Capers M.D.   On: 04/16/2022 19:57   CT ANGIO HEAD NECK W WO CM (CODE STROKE)  Result Date: 04/16/2022 CLINICAL DATA:  Stroke suspected EXAM: CT ANGIOGRAPHY HEAD AND NECK TECHNIQUE: Multidetector CT imaging of the head and neck was performed using the standard protocol during bolus administration of intravenous contrast. Multiplanar CT image reconstructions and MIPs were obtained to evaluate the vascular anatomy. Carotid stenosis measurements (when applicable) are obtained utilizing NASCET criteria, using the distal internal carotid diameter as the denominator. RADIATION DOSE REDUCTION: This exam was performed according to the departmental dose-optimization program which includes automated exposure control, adjustment of the mA and/or kV according to patient size and/or use of iterative reconstruction technique. CONTRAST:  75 mL Omnipaque 350 COMPARISON:  CT head 06/24/2021 FINDINGS: CT HEAD FINDINGS Brain: No evidence of acute infarction, hemorrhage, mass, mass effect, or midline shift. No hydrocephalus or extra-axial collection. Redemonstrated hypodensity in  the right frontal lobe, involving the right operculum, consistent with remote infarct. Lacunar infarct in right basal ganglia. Periventricular white matter changes, likely the sequela of chronic small vessel ischemic disease. Age-related cerebral volume loss. No Vascular: Possible hyperdense right MCA. No other hyperdense vessel. Atherosclerotic calcifications in the intracranial carotid and vertebral arteries. Skull: Negative for fracture or focal lesion. Sinuses/Orbits: Chronic opacification of the left sphenoid sinus. Minimal mucosal thickening in the right frontal sinus. Otherwise clear paranasal sinuses. Status post bilateral lens replacements. No acute finding in the orbits. Other: The mastoid air cells are well aerated. ASPECTS [facility] Stroke Program Early CT Score) - Ganglionic level infarction (caudate, lentiform nuclei, internal capsule, insula, M1-M3 cortex): 7 - Supraganglionic infarction (M4-M6 cortex): 3 Total score (0-10 with 10 being normal): 10 CTA NECK FINDINGS Aortic arch: Standard branching. Imaged portion shows no evidence of aneurysm or dissection. Approximately 70% stenosis in the proximal left subclavian artery (series 9, image 262). No other significant stenosis of the major arch vessel origins. 80% stenosis in the right subclavian artery, after the carotid takeoff (series 14, image 99). Aortic atherosclerosis. Right carotid system: No evidence of dissection, occlusion, or hemodynamically significant stenosis (greater than 50%). Atherosclerotic disease at the bifurcation and in the proximal ICA is not hemodynamically significant. Left carotid system: No evidence of dissection, occlusion, or hemodynamically significant stenosis (greater than 50%). Atherosclerotic disease at the bifurcation and in the proximal ICA is not hemodynamically significant. Vertebral arteries: No evidence of dissection, occlusion, or hemodynamically significant stenosis (greater than 50%). Skeleton: No acute osseous  abnormality. Degenerative changes in the cervical spine. Other neck: Multiple hypoenhancing nodules in the right greater than left thyroid lobe, the largest of which measures up to 1.5 cm. The thyroid was most recently evaluated with ultrasound in 2019. Upper chest: Emphysematous changes. No focal pulmonary opacity or pleural effusion. Review of the MIP images confirms the above findings CTA HEAD FINDINGS Evaluation is somewhat limited by bolus timing. Anterior circulation: Both internal carotid  arteries are patent to the termini, without significant stenosis. A1 segments patent, hypoplastic or stenotic on the right. Suspect an azygous A2 versus occlusion the right A2. Mild stenosis in the distal left ACA (series 9, image 64). Anterior cerebral arteries otherwise are patent to their distal aspects. Occlusion of the distal right M1 (series 9, image 93), without evidence of distal reconstitution. No left M1 stenosis or occlusion. Left MCA branches are perfused to their distal aspects without significant stenosis. Posterior circulation: Vertebral arteries patent to the vertebrobasilar junction, with severe stenosis in the mid left V4 (series 9, image 139 and moderate stenosis in the mid to distal right V4 (series 9, image 140). Posterior inferior cerebellar arteries patent proximally. Basilar patent to its distal aspect. Superior cerebellar arteries patent proximally. Patent left P1. Fetal origin of the right PCA from the right posterior communicating artery. The left posterior communicating artery is also patent. PCAs perfused to their distal aspects without stenosis. Venous sinuses: As permitted by contrast timing, patent. Anatomic variants: Fetal origin of the right PCA. Review of the MIP images confirms the above findings IMPRESSION: 1. Occlusion of the distal right M1. 2. 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 3. Severe stenosis in the mid left V4, moderate stenosis in the mid to distal right V4, and mild  stenosis in the distal left ACA. 4. 70% stenosis in the proximal left subclavian artery and 80% stenosis in the proximal right subclavian artery. No other hemodynamically significant stenosis in the neck. 5. No acute intracranial process. 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 ASPECTS is 10. 6. Aortic atherosclerosis and emphysema. 7. Multiple hypoenhancing nodules in the right greater than left thyroid lobe, the largest of which measures up to 1.5 cm. The thyroid was most recently evaluated by ultrasound in 2019. If further evaluation is clinically indicated, taking into account the patient's age and clinical status, a nonemergent thyroid ultrasound could be obtained. Aortic Atherosclerosis (ICD10-I70.0) and Emphysema (ICD10-J43.9). Impression #1 was discussed by telephone on 04/16/2022 at 4:18 pm with provider [facility] . Impression #2 was communicated on 04/16/2022 at 4:43 pm to provider Dr. Lorrin Goodell via secure text paging. Electronically Signed   By: Merilyn Baba M.D.   On: 04/16/2022 16:45       HISTORY OF PRESENT ILLNESS [name] is a 89 y.o. female with history of atrial fibrillation not on anticoagulation, aortic stenosis status post TAVR, CAD, CKD stage III, depression, breast cancer, hyperlipidemia, hypertension, hypothyroidism and arthritis who was last seen well by her caregiver at 1500.  Patient had gone to the bathroom and then began to complain of chest pain.  EMS was called, and when EMS arrived, patient developed dysarthria, left-sided weakness, right gaze deviation and left-sided neglect.  Head CT in emergency department showed no acute abnormalities with aspects of 10.  On CTA, right M1 occlusion was noted.  Discussion was had with patient's son, and consent was obtained for TNK administration and mechanical thrombectomy of her right [place] Acute ischemic stroke due to right MCA M1 occlusion in patient with atrial fibrillation not on  anticoagulation  Code Stroke CT head: No acute intracranial process. 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 ASPECTS is 10. Repeat CT head post-24hours: CTA head & neck: Occlusion of the distal right M1. 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 Severe stenosis in the mid left V4, moderate stenosis in the mid to distal right V4, and mild stenosis in the  distal left ACA. 70% stenosis in the proximal left subclavian artery and 80% stenosis in the proximal right subclavian artery. No other hemodynamically significant stenosis in the neck Post IR CT: Negative hemorrhage MRI scattered right MCA infarcts with chronic microhemorrages noted.   2D Echo: LVEF 45-50%, Global Hypokinesis, Severe Concentric LVH Mildly reduced RVSF Small Pericardial Effusion Present Trivial MVR   Atrial Fibrillation- will start eliquis today LDL 135 HgbA1c 4.6 VTE prophylaxis - SCDs No antithrombotic prior to admission, now on Eliquis daily Therapy recommendations: CIR Disposition:  pending   Hypertension Home meds:  norvasc, clonidine; on hold SBP goal 120-140 for first 24 hours post-thrombectomy Long-term BP goal normotensive   Hyperlipidemia Home meds:  lipitor '10mg'$  LDL 135, goal < 70 Increased to high-intensity, Lipitor '80mg'$  Continue statin at discharge     Other Stroke Risk Factors Advanced Age >/= 35  Family hx stroke (Mother) Coronary artery disease   Other Active Problems Recent COVID Positive December 2023 Severe AS s/p TAVR AKI on CKD stage III Hypothyroidism  DISCHARGE EXAM Blood pressure 121/72, pulse (!) 103, temperature 98 F (36.7 C), temperature source Oral, resp. rate 16, height '4\' 11"'$  (1.499 m), weight 64.6 kg, SpO2 99 %.  Constitutional: NAD. Elderly Caucasian female HEENT: [place], AT Respiratory: normal. Skin: WDI   Neuro: Mental Status: Follows commands.    Cranial Nerves: Alert, awake. Oriented to person, place but not year.   PERRL. EOMI. No visual field cut.  No droop. Tongue midline.  Motor: no drift in right leg and b/l arms. Slight drift in left leg.   Sensory: normal.  Cerebellar: no ataxia noted with FNF   Gait: not tested.     Discharge Diet      Diet   Diet regular Room service appropriate? Yes with Assist; Fluid consistency: Thin   liquids  DISCHARGE PLAN Disposition:  Transfer to [place] for ongoing PT, OT and ST Eliquis (apixaban) daily for secondary stroke prevention  Recommend ongoing stroke risk factor control by Primary Care Physician at time of discharge from inpatient rehabilitation. Follow-up PCP Seward Meth, NP in 2 weeks following discharge from rehab. Follow-up in [facility] in 8 weeks following discharge from rehab, office to schedule an appointment.   30 minutes were spent preparing discharge.  Patient seen and examined by NP/APP with MD. MD to update note as needed.   Janine Ores, DNP, FNP-BC [facility] Pager: [phone]   ATTENDING ATTESTATION:  Dr. Reeves Forth evaluated pt independently, reviewed imaging, chart, labs. Discussed and formulated plan with the Resident/APP. Changes were made to the note where appropriate. Please see APP/resident note above for details.    Gaurang Palikh,MD

## 2022-04-19 NOTE — Progress Notes (Addendum)
[place] for Apixaban Indication: atrial fibrillation  Allergies  Allergen Reactions   Latex Hives and Swelling    Other reaction(s): Unknown   Lisinopril Cough    Other reaction(s): Unknown   Povidone Iodine Other (See Comments)    Blisters  Other reaction(s): Unknown   Povidone-Iodine     Other Reaction(s): Unknown    Patient Measurements: Height: '4\' 11"'$  (149.9 cm) Weight: 64.6 kg (142 lb 6.7 oz) IBW/kg (Calculated) : 43.2  Vital Signs: Temp: 97.2 F (36.2 C) (02/05 0733) Temp Source: Oral (02/05 0733) BP: 138/100 (02/05 0733) Pulse Rate: 90 (02/05 0733)  Labs: Recent Labs    04/16/22 1616 04/16/22 1916 04/17/22 0554 04/18/22 0921 04/19/22 0325  HGB 11.1*   < > 11.1* 9.8* 8.5*  HCT 35.9*   < > 33.9* 29.8* 26.5*  PLT 272  --  307 276 194  APTT 30  --   --   --   --   LABPROT 14.2  --   --   --   --   INR 1.1  --   --   --   --   CREATININE 1.61*  --  1.27* 1.18* 0.97   < > = values in this interval not displayed.    Estimated Creatinine Clearance: 29 mL/min (by C-G formula based on SCr of 0.97 mg/dL).   Assessment:  89 y.o. female with medical history significant for atrial fibrillation not on anticoagulation who presented with left-sided weakness. CTA with right M1 occlusion now s/p TNK and mechanical thrombectomy on 2/02. Pharmacy consulted to initiate apixaban.  Goal of Therapy:  Monitor platelets by anticoagulation protocol: Yes   Plan:  Apixaban '5mg'$  PO twice daily Stop diclofenac tablets to reduce bleeding risk Continue to monitor H&H and platelets  Thank you for allowing pharmacy to be a part of this patient's care.  Ardyth Harps, PharmD Clinical Pharmacist

## 2022-04-19 NOTE — TOC Benefit Eligibility Note (Signed)
Patient Advocate Encounter  Insurance verification completed.    The patient is currently admitted and upon discharge could be taking Eliquis 5 mg.  The current 30 day co-pay is $47.00.   The patient is insured through [employer] Medicare Part D   Seung Nidiffer, CPHT Pharmacy Patient Advocate Specialist [facility] Patient Advocate Team Direct Number: [phone]  Fax: [phone]       

## 2022-04-19 NOTE — Progress Notes (Cosign Needed Addendum)
Referring Physician(s): Code Stroke  Supervising Physician: Luanne Bras  Patient Status:  [facility] - In-pt  Chief Complaint:  Occluded distal right M1 segment. Status post complex revascularization of occluded right middle cerebral artery distal M1 segment with 1 pass with contact aspiration and proximal flow arrest achieving aTICI 3 revascularization Dr. Luanne Bras   Subjective:  Patient alert and oriented.  asking for assistance eating breakfast.  Allergies: Latex, Lisinopril, Povidone iodine, and Povidone-iodine  Medications: Prior to Admission medications   Medication Sig Start Date End Date Taking? Authorizing Provider  albuterol (VENTOLIN HFA) 108 (90 Base) MCG/ACT inhaler Inhale 2 puffs into the lungs every 4 (four) hours as needed for wheezing or shortness of breath. 04/09/22  Yes Nona Dell, Corene Cornea, MD  amLODipine (NORVASC) 2.5 MG tablet Take 2.5 mg by mouth 2 (two) times daily. 07/14/21  Yes [provider]  buPROPion (WELLBUTRIN XL) 300 MG 24 hr tablet Take 300 mg by mouth every morning. 07/29/21  Yes [provider]  busPIRone (BUSPAR) 5 MG tablet Take 1 tablet (5 mg total) by mouth 3 (three) times daily as needed (anxiety). 03/09/22  Yes Townsend Roger, MD  cloNIDine (CATAPRES) 0.1 MG tablet Take 0.1 mg by mouth 2 (two) times daily. 03/18/20  Yes [provider]  diclofenac (VOLTAREN) 25 MG EC tablet Take 1 tablet (25 mg total) by mouth 2 (two) times daily as needed for moderate pain. 03/09/22  Yes Townsend Roger, MD  diphenoxylate-atropine (LOMOTIL) 2.5-0.025 MG tablet Take 1 tablet by mouth 4 (four) times daily as needed for diarrhea or loose stools. 10/21/20  Yes Barb Merino, MD  Eszopiclone 3 MG TABS Take 3 mg by mouth at bedtime. 02/16/22  Yes [provider]  FEROSUL 325 (65 Fe) MG tablet Take 325 mg by mouth 2 (two) times daily. 03/03/22  Yes [provider]  fluticasone (FLONASE) 50 MCG/ACT nasal spray Place 1  spray into both nostrils in the morning and at bedtime. 02/26/22 02/26/23 Yes Townsend Roger, MD  furosemide (LASIX) 20 MG tablet Take 1 tablet (20 mg total) by mouth 2 (two) times daily. 04/09/22  Yes Townsend Roger, MD  Magnesium Oxide 500 MG CAPS Take 500 mg by mouth daily. 10/21/20  Yes [provider]  metoprolol succinate (TOPROL-XL) 25 MG 24 hr tablet Take 25 mg by mouth 2 (two) times daily. 03/03/22  Yes [provider]  Probiotic Product (ALIGN PO) Take 1 capsule by mouth daily.   Yes [provider]  doxycycline (MONODOX) 100 MG capsule Take 1 capsule (100 mg total) by mouth 2 (two) times daily. Patient not taking: Reported on 04/17/2022 02/26/22   Townsend Roger, MD  levothyroxine (SYNTHROID, LEVOTHROID) 50 MCG tablet Take 50 mcg by mouth daily before breakfast.  Patient not taking: Reported on 04/17/2022 12/09/16   [provider]     Vital Signs: BP (!) 138/100 (BP Location: Right Arm)   Pulse 90   Temp (!) 97.2 F (36.2 C) (Oral)   Resp 20   Ht '4\' 11"'$  (1.499 m)   Wt 142 lb 6.7 oz (64.6 kg)   SpO2 97%   BMI 28.76 kg/m   Physical Exam Vitals and nursing note reviewed.  Constitutional:      Appearance: She is well-developed.  HENT:     Head: Normocephalic and atraumatic.  Eyes:     Conjunctiva/sclera: Conjunctivae normal.  Pulmonary:     Effort: Pulmonary effort is normal.  Musculoskeletal:  General: Normal range of motion.     Cervical back: Normal range of motion.  Skin:    General: Skin is warm.  Neurological:     Mental Status: She is alert and oriented to person, place, and time.     Imaging: MR BRAIN WO CONTRAST  Result Date: 04/17/2022 CLINICAL DATA:  Stroke follow-up EXAM: MRI HEAD WITHOUT CONTRAST TECHNIQUE: Multiplanar, multiecho pulse sequences of the brain and surrounding structures were obtained without intravenous contrast. COMPARISON:  CT Head/angio 04/16/22 FINDINGS: Brain: There are acute infarcts in the  anterior right temporal lobe (series 2, image 21) and in the body of the corpus callosum (series 2, image 32). There is also an additional small region of acute infarct in the right corona radiata (series 2, image 31). There is a separate region of late acute to early subacute infarct in the left centrum semiovale (series 2, image 36). Sequela of severe chronic microvascular ischemic change with chronic infarcts in the right frontal lobe in the left cerebellum. There are scattered regions of susceptibility artifact on susceptibility weighted imaging, for example abutting the left occipital horn (series 7, image 48) in the centrum semiovale on the right (series 7, image 68) which appear dark on all of the obtained sequences. These may represent sites of microhemorrhages or small volume pneumocephalus. Vascular: Normal flow voids. Skull and upper cervical spine: Normal marrow signal. Sinuses/Orbits: Bilateral lens replacement. No mastoid effusion. Paranasal sinuses are notable for mucosal thickening in the left sphenoid sinus Other: None IMPRESSION: 1. Acute infarcts in the anterior right temporal lobe, body of the corpus callosum, right corona radiata, and left centrum semiovale. 2. Scattered regions of susceptibility artifact appear dark on all of the obtained sequences, which may represent sites of microhemorrhages or small volume pneumocephalus. No parenchymal hematoma. Consider further evalution with a CT of the head for further evaluation. These results will be called to the ordering clinician or representative by the Radiologist Assistant, and communication documented in the PACS or [employer]. Electronically Signed   By: Marin Roberts M.D.   On: 04/17/2022 16:50   ECHOCARDIOGRAM COMPLETE  Result Date: 04/17/2022    ECHOCARDIOGRAM REPORT   Patient Name:   [facility] ANN BLALOCK Brosious Date of Exam: 04/17/2022 Medical Rec #:  [id number]                 Height:       59.0 in Accession #:    [id number]                 Weight:       142.4 lb Date of Birth:  [birth year]                  BSA:          1.597 m Patient Age:    89 years                  BP:           121/95 mmHg Patient Gender: F                         HR:           110 bpm. Exam Location:  Inpatient Procedure: 2D Echo, Cardiac Doppler and Color Doppler Indications:    I63.9 Stroke  History:        Patient has prior history of Echocardiogram examinations, most  recent 03/02/2022. CKD and Stroke, Arrythmias:Atrial                 Fibrillation; Risk Factors:Hypertension and Dyslipidemia.  Sonographer:    Wilkie Aye RVT RCS Referring Phys: [id number] [place] E [place]  Sonographer Comments: Echo performed with patient supine and on artificial respirator. IMPRESSIONS  1. Consider infiltrative cardiomyopathy such as amyloid. Left ventricular ejection fraction, by estimation, is 45 to 50%. Left ventricular ejection fraction by PLAX is 47 %. The left ventricle has mildly decreased function. The left ventricle demonstrates global hypokinesis. There is severe concentric left ventricular hypertrophy. Left ventricular diastolic function could not be evaluated.  2. Right ventricular systolic function is mildly reduced. The right ventricular size is normal. There is normal pulmonary artery systolic pressure. The estimated right ventricular systolic pressure is 24.5 mmHg.  3. Left atrial size was moderately dilated.  4. Right atrial size was moderately dilated.  5. A small pericardial effusion is present. The pericardial effusion is circumferential. There is no evidence of cardiac tamponade.  6. The mitral valve is abnormal. Trivial mitral valve regurgitation. Moderate mitral annular calcification.  7. The aortic valve is tricuspid. Aortic valve regurgitation is not visualized. No aortic stenosis is present.  8. The inferior vena cava is normal in size with greater than 50% respiratory variability, suggesting right atrial pressure of 3 mmHg.  9. Rhythm strip  during this exam demonstrates atrial fibrillation. Comparison(s): Prior images unable to be directly viewed, comparison made by report only. Changes from prior study are noted. 03/02/2022: [facility] hospital) - LVEF 55-60%. FINDINGS  Left Ventricle: Consider infiltrative cardiomyopathy such as amyloid. Left ventricular ejection fraction, by estimation, is 45 to 50%. Left ventricular ejection fraction by PLAX is 47 %. The left ventricle has mildly decreased function. The left ventricle demonstrates global hypokinesis. The left ventricular internal cavity size was normal in size. There is severe concentric left ventricular hypertrophy. Left ventricular diastolic function could not be evaluated due to atrial fibrillation. Left ventricular diastolic function could not be evaluated. Right Ventricle: The right ventricular size is normal. No increase in right ventricular wall thickness. Right ventricular systolic function is mildly reduced. There is normal pulmonary artery systolic pressure. The tricuspid regurgitant velocity is 2.02 m/s, and with an assumed right atrial pressure of 3 mmHg, the estimated right ventricular systolic pressure is 80.9 mmHg. Left Atrium: Left atrial size was moderately dilated. Right Atrium: Right atrial size was moderately dilated. Pericardium: A small pericardial effusion is present. The pericardial effusion is circumferential. There is no evidence of cardiac tamponade. Mitral Valve: The mitral valve is abnormal. There is moderate calcification of the anterior and posterior mitral valve leaflet(s). Moderate mitral annular calcification. Trivial mitral valve regurgitation. Tricuspid Valve: The tricuspid valve is grossly normal. Tricuspid valve regurgitation is mild. Aortic Valve: The aortic valve is tricuspid. Aortic valve regurgitation is not visualized. No aortic stenosis is present. Aortic valve mean gradient measures 3.3 mmHg. Aortic valve peak gradient measures 6.1 mmHg. Aortic valve  area, by VTI measures 1.29 cm. Pulmonic Valve: The pulmonic valve was grossly normal. Pulmonic valve regurgitation is trivial. Aorta: The aortic root and ascending aorta are structurally normal, with no evidence of dilitation. Venous: The inferior vena cava is normal in size with greater than 50% respiratory variability, suggesting right atrial pressure of 3 mmHg. IAS/Shunts: No atrial level shunt detected by color flow Doppler. EKG: Rhythm strip during this exam demonstrates atrial fibrillation.  LEFT VENTRICLE PLAX 2D LV EF:  Left ventricular ejection fraction by PLAX is 47 %. LVIDd:         2.70 cm LVIDs:         2.10 cm LV PW:         1.80 cm LV IVS:        1.90 cm LVOT diam:     1.40 cm LV SV:         27 LV SV Index:   17 LVOT Area:     1.54 cm  RIGHT VENTRICLE            IVC RV Basal diam:  3.70 cm    IVC diam: 0.90 cm RV S prime:     7.81 cm/s TAPSE (M-mode): 1.9 cm LEFT ATRIUM           Index        RIGHT ATRIUM           Index LA diam:      4.00 cm 2.51 cm/m   RA Area:     12.40 cm LA Vol (A2C): 68.6 ml 42.97 ml/m  RA Volume:   28.60 ml  17.91 ml/m  AORTIC VALVE AV Area (Vmax):    1.40 cm AV Area (Vmean):   1.34 cm AV Area (VTI):     1.29 cm AV Vmax:           123.00 cm/s AV Vmean:          84.767 cm/s AV VTI:            0.206 m AV Peak Grad:      6.1 mmHg AV Mean Grad:      3.3 mmHg LVOT Vmax:         111.57 cm/s LVOT Vmean:        73.633 cm/s LVOT VTI:          0.172 m LVOT/AV VTI ratio: 0.84  AORTA Ao Root diam: 2.30 cm MITRAL VALVE                TRICUSPID VALVE MV Area (PHT): 3.10 cm     TR Peak grad:   16.3 mmHg MV Decel Time: 244 msec     TR Vmax:        202.00 cm/s MV E velocity: 152.00 cm/s MV A velocity: 54.55 cm/s   SHUNTS MV E/A ratio:  2.79         Systemic VTI:  0.17 m                             Systemic Diam: 1.40 cm Lyman Bishop MD Electronically signed by Lyman Bishop MD Signature Date/Time: 04/17/2022/12:23:41 PM    Final    DG CHEST PORT 1 VIEW  Result Date:  04/16/2022 CLINICAL DATA:  Endotracheal tube EXAM: PORTABLE CHEST 1 VIEW COMPARISON:  03/02/2022 FINDINGS: Endotracheal tube with tip measuring 2.3 cm above the carina. Shallow inspiration. Mild cardiac enlargement. Cardiac valve prosthesis. Lungs are clear. No pleural effusions. No pneumothorax. Mediastinal contours appear intact. Calcified and tortuous aorta. Degenerative changes in the spine and shoulders. IMPRESSION: Endotracheal tube appears in satisfactory position. Cardiac enlargement. Lungs are clear. Electronically Signed   By: Lucienne Capers M.D.   On: 04/16/2022 19:57   CT ANGIO HEAD NECK W WO CM (CODE STROKE)  Result Date: 04/16/2022 CLINICAL DATA:  Stroke suspected EXAM: CT ANGIOGRAPHY HEAD AND NECK TECHNIQUE: Multidetector CT imaging of the head and neck was  performed using the standard protocol during bolus administration of intravenous contrast. Multiplanar CT image reconstructions and MIPs were obtained to evaluate the vascular anatomy. Carotid stenosis measurements (when applicable) are obtained utilizing NASCET criteria, using the distal internal carotid diameter as the denominator. RADIATION DOSE REDUCTION: This exam was performed according to the departmental dose-optimization program which includes automated exposure control, adjustment of the mA and/or kV according to patient size and/or use of iterative reconstruction technique. CONTRAST:  75 mL Omnipaque 350 COMPARISON:  CT head 06/24/2021 FINDINGS: CT HEAD FINDINGS Brain: No evidence of acute infarction, hemorrhage, mass, mass effect, or midline shift. No hydrocephalus or extra-axial collection. Redemonstrated hypodensity in the right frontal lobe, involving the right operculum, consistent with remote infarct. Lacunar infarct in right basal ganglia. Periventricular white matter changes, likely the sequela of chronic small vessel ischemic disease. Age-related cerebral volume loss. No Vascular: Possible hyperdense right MCA. No other  hyperdense vessel. Atherosclerotic calcifications in the intracranial carotid and vertebral arteries. Skull: Negative for fracture or focal lesion. Sinuses/Orbits: Chronic opacification of the left sphenoid sinus. Minimal mucosal thickening in the right frontal sinus. Otherwise clear paranasal sinuses. Status post bilateral lens replacements. No acute finding in the orbits. Other: The mastoid air cells are well aerated. ASPECTS [facility] Stroke Program Early CT Score) - Ganglionic level infarction (caudate, lentiform nuclei, internal capsule, insula, M1-M3 cortex): 7 - Supraganglionic infarction (M4-M6 cortex): 3 Total score (0-10 with 10 being normal): 10 CTA NECK FINDINGS Aortic arch: Standard branching. Imaged portion shows no evidence of aneurysm or dissection. Approximately 70% stenosis in the proximal left subclavian artery (series 9, image 262). No other significant stenosis of the major arch vessel origins. 80% stenosis in the right subclavian artery, after the carotid takeoff (series 14, image 99). Aortic atherosclerosis. Right carotid system: No evidence of dissection, occlusion, or hemodynamically significant stenosis (greater than 50%). Atherosclerotic disease at the bifurcation and in the proximal ICA is not hemodynamically significant. Left carotid system: No evidence of dissection, occlusion, or hemodynamically significant stenosis (greater than 50%). Atherosclerotic disease at the bifurcation and in the proximal ICA is not hemodynamically significant. Vertebral arteries: No evidence of dissection, occlusion, or hemodynamically significant stenosis (greater than 50%). Skeleton: No acute osseous abnormality. Degenerative changes in the cervical spine. Other neck: Multiple hypoenhancing nodules in the right greater than left thyroid lobe, the largest of which measures up to 1.5 cm. The thyroid was most recently evaluated with ultrasound in 2019. Upper chest: Emphysematous changes. No focal pulmonary  opacity or pleural effusion. Review of the MIP images confirms the above findings CTA HEAD FINDINGS Evaluation is somewhat limited by bolus timing. Anterior circulation: Both internal carotid arteries are patent to the termini, without significant stenosis. A1 segments patent, hypoplastic or stenotic on the right. Suspect an azygous A2 versus occlusion the right A2. Mild stenosis in the distal left ACA (series 9, image 64). Anterior cerebral arteries otherwise are patent to their distal aspects. Occlusion of the distal right M1 (series 9, image 93), without evidence of distal reconstitution. No left M1 stenosis or occlusion. Left MCA branches are perfused to their distal aspects without significant stenosis. Posterior circulation: Vertebral arteries patent to the vertebrobasilar junction, with severe stenosis in the mid left V4 (series 9, image 139 and moderate stenosis in the mid to distal right V4 (series 9, image 140). Posterior inferior cerebellar arteries patent proximally. Basilar patent to its distal aspect. Superior cerebellar arteries patent proximally. Patent left P1. Fetal origin of the right PCA from the right  posterior communicating artery. The left posterior communicating artery is also patent. PCAs perfused to their distal aspects without stenosis. Venous sinuses: As permitted by contrast timing, patent. Anatomic variants: Fetal origin of the right PCA. Review of the MIP images confirms the above findings IMPRESSION: 1. Occlusion of the distal right M1. 2. 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 3. Severe stenosis in the mid left V4, moderate stenosis in the mid to distal right V4, and mild stenosis in the distal left ACA. 4. 70% stenosis in the proximal left subclavian artery and 80% stenosis in the proximal right subclavian artery. No other hemodynamically significant stenosis in the neck. 5. No acute intracranial process. 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 ASPECTS is 10. 6. Aortic  atherosclerosis and emphysema. 7. Multiple hypoenhancing nodules in the right greater than left thyroid lobe, the largest of which measures up to 1.5 cm. The thyroid was most recently evaluated by ultrasound in 2019. If further evaluation is clinically indicated, taking into account the patient's age and clinical status, a nonemergent thyroid ultrasound could be obtained. Aortic Atherosclerosis (ICD10-I70.0) and Emphysema (ICD10-J43.9). Impression #1 was discussed by telephone on 04/16/2022 at 4:18 pm with provider [facility] . Impression #2 was communicated on 04/16/2022 at 4:43 pm to provider Dr. Lorrin Goodell via secure text paging. Electronically Signed   By: Merilyn Baba M.D.   On: 04/16/2022 16:45    Labs:  CBC: Recent Labs    04/16/22 1616 04/16/22 1916 04/16/22 2046 04/17/22 0554 04/18/22 0921 04/19/22 0325  WBC 4.4  --   --  10.6* 9.9 6.4  HGB 11.1*   < > 11.9* 11.1* 9.8* 8.5*  HCT 35.9*   < > 35.0* 33.9* 29.8* 26.5*  PLT 272  --   --  307 276 194   < > = values in this interval not displayed.    COAGS: Recent Labs    04/16/22 1616  INR 1.1  APTT 30    BMP: Recent Labs    04/16/22 1616 04/16/22 2046 04/17/22 0554 04/18/22 0921 04/19/22 0325  NA 137 137 135 135 137  K 3.3* 3.6 4.2 3.6 3.4*  CL 102  --  103 104 107  CO2 23  --  '22 22 23  '$ GLUCOSE 176*  --  167* 141* 105*  BUN 15  --  '16 16 14  '$ CALCIUM 9.0  --  9.1 9.1 8.9  CREATININE 1.61*  --  1.27* 1.18* 0.97  GFRNONAA 29*  --  39* 43* 54*    LIVER FUNCTION TESTS: Recent Labs    09/07/21 0000 04/16/22 1616  BILITOT  --  0.7  AST 25 31  ALT 16 22  ALKPHOS 52 53  PROT  --  6.2*  ALBUMIN 3.6 3.4*    Assessment and Plan:  89 y.o. female inpatient. History of a fib. Aortic stenosis s/p TAVR, CAD, CKD, breast cancer, HLD, HTN. Presented to the ED with chest pain. Developed dysarthria and left sided weakness with  left sided neglect and right sided weakness  en route to the hospital with EMS.Code stroke  was called. Patient found to have a occluded distal right M1 segment s/p complex revascularization of occluded right middle cerebral artery distal M1 segment with 1 pass with contact aspiration and proximal flow arrest achieving aTICI 3 revascularization with Dr. Luanne Bras  Patient seen at bedside with Dr. Luanne Bras. Alert and oriented. Able to move all 4 dexterities. Right groin access site is soft  with no active bleeding and no appreciable pseudoaneurysm. Dressing is C/D/I.  Patient to stable from [facility] perspective s/p carotid arteriogram with intervention further plans per primary Neurology please call IR with questions or concerns.   Electronically Signed: Jacqualine Mau, NP 04/19/2022, 10:51 AM   I spent a total of 15 Minutes at the patient's bedside AND on the patient's hospital floor or unit, greater than 50% of which was counseling/coordinating care for carotid arteriogram

## 2022-04-19 NOTE — Plan of Care (Signed)
  Problem: Education: Goal: Knowledge of disease or condition will improve Outcome: Progressing Goal: Knowledge of secondary prevention will improve (MUST DOCUMENT ALL) Outcome: Progressing   Problem: Ischemic Stroke/TIA Tissue Perfusion: Goal: Complications of ischemic stroke/TIA will be minimized Outcome: Progressing   Problem: Health Behavior/Discharge Planning: Goal: Ability to manage health-related needs will improve Outcome: Progressing   Problem: Clinical Measurements: Goal: Cardiovascular complication will be avoided Outcome: Progressing

## 2022-04-19 NOTE — Discharge Instructions (Signed)

## 2022-04-19 NOTE — Plan of Care (Signed)
  Problem: Education: Goal: Knowledge of disease or condition will improve Outcome: Progressing Goal: Knowledge of secondary prevention will improve (MUST DOCUMENT ALL) Outcome: Progressing Goal: Knowledge of patient specific risk factors will improve [name] N/A or DELETE if not current risk factor) Outcome: Progressing   Problem: Ischemic Stroke/TIA Tissue Perfusion: Goal: Complications of ischemic stroke/TIA will be minimized Outcome: Progressing   Problem: Coping: Goal: Will verbalize positive feelings about self Outcome: Progressing Goal: Will identify appropriate support needs Outcome: Progressing   Problem: Health Behavior/Discharge Planning: Goal: Ability to manage health-related needs will improve Outcome: Progressing Goal: Goals will be collaboratively established with patient/family Outcome: Progressing   Problem: Self-Care: Goal: Ability to participate in self-care as condition permits will improve Outcome: Progressing Goal: Verbalization of feelings and concerns over difficulty with self-care will improve Outcome: Progressing Goal: Ability to communicate needs accurately will improve Outcome: Progressing   Problem: Nutrition: Goal: Risk of aspiration will decrease Outcome: Progressing Goal: Dietary intake will improve Outcome: Progressing   Problem: Education: Goal: Understanding of CV disease, CV risk reduction, and recovery process will improve Outcome: Progressing Goal: Individualized Educational Video(s) Outcome: Progressing   Problem: Activity: Goal: Ability to return to baseline activity level will improve Outcome: Progressing   Problem: Cardiovascular: Goal: Ability to achieve and maintain adequate cardiovascular perfusion will improve Outcome: Progressing Goal: Vascular access site(s) Level 0-1 will be maintained Outcome: Progressing   Problem: Health Behavior/Discharge Planning: Goal: Ability to safely manage health-related needs after  discharge will improve Outcome: Progressing   Problem: Activity: Goal: Ability to tolerate increased activity will improve Outcome: Progressing   Problem: Respiratory: Goal: Ability to maintain a clear airway and adequate ventilation will improve Outcome: Progressing   Problem: Role Relationship: Goal: Method of communication will improve Outcome: Progressing   Problem: Education: Goal: Knowledge of General Education information will improve Description: Including pain rating scale, medication(s)/side effects and non-pharmacologic comfort measures Outcome: Progressing   Problem: Health Behavior/Discharge Planning: Goal: Ability to manage health-related needs will improve Outcome: Progressing   Problem: Clinical Measurements: Goal: Ability to maintain clinical measurements within normal limits will improve Outcome: Progressing Goal: Will remain free from infection Outcome: Progressing Goal: Diagnostic test results will improve Outcome: Progressing Goal: Respiratory complications will improve Outcome: Progressing Goal: Cardiovascular complication will be avoided Outcome: Progressing   Problem: Activity: Goal: Risk for activity intolerance will decrease Outcome: Progressing   Problem: Nutrition: Goal: Adequate nutrition will be maintained Outcome: Progressing   Problem: Coping: Goal: Level of anxiety will decrease Outcome: Progressing   Problem: Elimination: Goal: Will not experience complications related to bowel motility Outcome: Progressing Goal: Will not experience complications related to urinary retention Outcome: Progressing   Problem: Pain Managment: Goal: General experience of comfort will improve Outcome: Progressing   Problem: Safety: Goal: Ability to remain free from injury will improve Outcome: Progressing   Problem: Skin Integrity: Goal: Risk for impaired skin integrity will decrease Outcome: Progressing

## 2022-04-19 NOTE — Care Management Important Message (Signed)
Important Message  Patient Details  Name: [name] MRN: [id number] Date of Birth: [birth year]   Medicare Important Message Given:  Yes     Ariannah Arenson 04/19/2022, 2:26 PM

## 2022-04-20 ENCOUNTER — Other Ambulatory Visit: Payer: Self-pay

## 2022-04-20 ENCOUNTER — Inpatient Hospital Stay (HOSPITAL_COMMUNITY)
Admission: RE | Admit: 2022-04-20 | Discharge: 2022-04-28 | DRG: 057 | Disposition: A | Discharge status: Home Health | Payer: Medicare Other | Source: Intra-hospital | Attending: Physical Medicine and Rehabilitation | Admitting: Physical Medicine and Rehabilitation

## 2022-04-20 DIAGNOSIS — I69354 Hemiplegia and hemiparesis following cerebral infarction affecting left non-dominant side: Secondary | ICD-10-CM | POA: Diagnosis not present

## 2022-04-20 DIAGNOSIS — E039 Hypothyroidism, unspecified: Secondary | ICD-10-CM | POA: Diagnosis present

## 2022-04-20 DIAGNOSIS — K219 Gastro-esophageal reflux disease without esophagitis: Secondary | ICD-10-CM | POA: Diagnosis present

## 2022-04-20 DIAGNOSIS — R414 Neurologic neglect syndrome: Secondary | ICD-10-CM | POA: Diagnosis present

## 2022-04-20 DIAGNOSIS — E785 Hyperlipidemia, unspecified: Secondary | ICD-10-CM | POA: Diagnosis present

## 2022-04-20 DIAGNOSIS — C50912 Malignant neoplasm of unspecified site of left female breast: Secondary | ICD-10-CM | POA: Diagnosis present

## 2022-04-20 DIAGNOSIS — I5032 Chronic diastolic (congestive) heart failure: Secondary | ICD-10-CM | POA: Diagnosis present

## 2022-04-20 DIAGNOSIS — J309 Allergic rhinitis, unspecified: Secondary | ICD-10-CM | POA: Diagnosis present

## 2022-04-20 DIAGNOSIS — F32A Depression, unspecified: Secondary | ICD-10-CM | POA: Diagnosis present

## 2022-04-20 DIAGNOSIS — D631 Anemia in chronic kidney disease: Secondary | ICD-10-CM | POA: Diagnosis present

## 2022-04-20 DIAGNOSIS — E876 Hypokalemia: Secondary | ICD-10-CM | POA: Diagnosis present

## 2022-04-20 DIAGNOSIS — N179 Acute kidney failure, unspecified: Secondary | ICD-10-CM | POA: Diagnosis present

## 2022-04-20 DIAGNOSIS — Z79899 Other long term (current) drug therapy: Secondary | ICD-10-CM

## 2022-04-20 DIAGNOSIS — M810 Age-related osteoporosis without current pathological fracture: Secondary | ICD-10-CM | POA: Diagnosis present

## 2022-04-20 DIAGNOSIS — F5104 Psychophysiologic insomnia: Secondary | ICD-10-CM | POA: Diagnosis present

## 2022-04-20 DIAGNOSIS — M79609 Pain in unspecified limb: Secondary | ICD-10-CM | POA: Diagnosis not present

## 2022-04-20 DIAGNOSIS — R413 Other amnesia: Secondary | ICD-10-CM | POA: Diagnosis present

## 2022-04-20 DIAGNOSIS — I63511 Cerebral infarction due to unspecified occlusion or stenosis of right middle cerebral artery: Principal | ICD-10-CM | POA: Diagnosis present

## 2022-04-20 DIAGNOSIS — I2510 Atherosclerotic heart disease of native coronary artery without angina pectoris: Secondary | ICD-10-CM | POA: Diagnosis present

## 2022-04-20 DIAGNOSIS — Z952 Presence of prosthetic heart valve: Secondary | ICD-10-CM

## 2022-04-20 DIAGNOSIS — F419 Anxiety disorder, unspecified: Secondary | ICD-10-CM | POA: Diagnosis present

## 2022-04-20 DIAGNOSIS — Z8249 Family history of ischemic heart disease and other diseases of the circulatory system: Secondary | ICD-10-CM

## 2022-04-20 DIAGNOSIS — I4891 Unspecified atrial fibrillation: Secondary | ICD-10-CM | POA: Diagnosis present

## 2022-04-20 DIAGNOSIS — R339 Retention of urine, unspecified: Secondary | ICD-10-CM | POA: Diagnosis not present

## 2022-04-20 DIAGNOSIS — Z85828 Personal history of other malignant neoplasm of skin: Secondary | ICD-10-CM

## 2022-04-20 DIAGNOSIS — Z8616 Personal history of COVID-19: Secondary | ICD-10-CM

## 2022-04-20 DIAGNOSIS — R0902 Hypoxemia: Secondary | ICD-10-CM | POA: Diagnosis present

## 2022-04-20 DIAGNOSIS — G4700 Insomnia, unspecified: Secondary | ICD-10-CM | POA: Diagnosis not present

## 2022-04-20 DIAGNOSIS — I130 Hypertensive heart and chronic kidney disease with heart failure and stage 1 through stage 4 chronic kidney disease, or unspecified chronic kidney disease: Secondary | ICD-10-CM | POA: Diagnosis present

## 2022-04-20 DIAGNOSIS — F17210 Nicotine dependence, cigarettes, uncomplicated: Secondary | ICD-10-CM | POA: Diagnosis present

## 2022-04-20 DIAGNOSIS — Z96653 Presence of artificial knee joint, bilateral: Secondary | ICD-10-CM | POA: Diagnosis present

## 2022-04-20 DIAGNOSIS — Z853 Personal history of malignant neoplasm of breast: Secondary | ICD-10-CM

## 2022-04-20 DIAGNOSIS — I10 Essential (primary) hypertension: Secondary | ICD-10-CM | POA: Diagnosis present

## 2022-04-20 DIAGNOSIS — M159 Polyosteoarthritis, unspecified: Secondary | ICD-10-CM | POA: Diagnosis present

## 2022-04-20 DIAGNOSIS — Z8673 Personal history of transient ischemic attack (TIA), and cerebral infarction without residual deficits: Secondary | ICD-10-CM | POA: Diagnosis not present

## 2022-04-20 DIAGNOSIS — N183 Chronic kidney disease, stage 3 unspecified: Secondary | ICD-10-CM | POA: Diagnosis present

## 2022-04-20 DIAGNOSIS — I6340 Cerebral infarction due to embolism of unspecified cerebral artery: Secondary | ICD-10-CM | POA: Diagnosis present

## 2022-04-20 DIAGNOSIS — Z888 Allergy status to other drugs, medicaments and biological substances status: Secondary | ICD-10-CM

## 2022-04-20 DIAGNOSIS — I69392 Facial weakness following cerebral infarction: Secondary | ICD-10-CM | POA: Diagnosis not present

## 2022-04-20 DIAGNOSIS — K529 Noninfective gastroenteritis and colitis, unspecified: Secondary | ICD-10-CM | POA: Diagnosis present

## 2022-04-20 DIAGNOSIS — Z91040 Latex allergy status: Secondary | ICD-10-CM

## 2022-04-20 DIAGNOSIS — D539 Nutritional anemia, unspecified: Secondary | ICD-10-CM | POA: Diagnosis present

## 2022-04-20 DIAGNOSIS — F05 Delirium due to known physiological condition: Secondary | ICD-10-CM | POA: Diagnosis not present

## 2022-04-20 DIAGNOSIS — H353132 Nonexudative age-related macular degeneration, bilateral, intermediate dry stage: Secondary | ICD-10-CM | POA: Diagnosis present

## 2022-04-20 DIAGNOSIS — H903 Sensorineural hearing loss, bilateral: Secondary | ICD-10-CM | POA: Diagnosis present

## 2022-04-20 LAB — CBC WITH DIFFERENTIAL/PLATELET
Abs Immature Granulocytes: 0.04 10*3/uL (ref 0.00–0.07)
Basophils Absolute: 0.1 10*3/uL (ref 0.0–0.1)
Basophils Relative: 1 %
Eosinophils Absolute: 0.1 10*3/uL (ref 0.0–0.5)
Eosinophils Relative: 2 %
HCT: 30.4 % — ABNORMAL LOW (ref 36.0–46.0)
Hemoglobin: 9.3 g/dL — ABNORMAL LOW (ref 12.0–15.0)
Immature Granulocytes: 0 %
Lymphocytes Relative: 15 %
Lymphs Abs: 1.4 10*3/uL (ref 0.7–4.0)
MCH: 26.8 pg (ref 26.0–34.0)
MCHC: 30.6 g/dL (ref 30.0–36.0)
MCV: 87.6 fL (ref 80.0–100.0)
Monocytes Absolute: 0.8 10*3/uL (ref 0.1–1.0)
Monocytes Relative: 9 %
Neutro Abs: 6.7 10*3/uL (ref 1.7–7.7)
Neutrophils Relative %: 73 %
Platelets: 227 10*3/uL (ref 150–400)
RBC: 3.47 MIL/uL — ABNORMAL LOW (ref 3.87–5.11)
RDW: 21.8 % — ABNORMAL HIGH (ref 11.5–15.5)
Smear Review: ADEQUATE
WBC: 9.1 10*3/uL (ref 4.0–10.5)
nRBC: 0 % (ref 0.0–0.2)

## 2022-04-20 LAB — BASIC METABOLIC PANEL
Anion gap: 6 (ref 5–15)
BUN: 12 mg/dL (ref 8–23)
CO2: 25 mmol/L (ref 22–32)
Calcium: 8.8 mg/dL — ABNORMAL LOW (ref 8.9–10.3)
Chloride: 104 mmol/L (ref 98–111)
Creatinine, Ser: 1.03 mg/dL — ABNORMAL HIGH (ref 0.44–1.00)
GFR, Estimated: 50 mL/min — ABNORMAL LOW (ref >60)
Glucose, Bld: 107 mg/dL — ABNORMAL HIGH (ref 70–99)
Potassium: 3.4 mmol/L — ABNORMAL LOW (ref 3.5–5.1)
Sodium: 135 mmol/L (ref 135–145)

## 2022-04-20 LAB — PHOSPHORUS: Phosphorus: 3.4 mg/dL (ref 2.5–4.6)

## 2022-04-20 LAB — MAGNESIUM: Magnesium: 1.6 mg/dL — ABNORMAL LOW (ref 1.7–2.4)

## 2022-04-20 MED ORDER — MELATONIN 3 MG PO TABS: 3.0000 mg | ORAL_TABLET | Freq: Every evening | ORAL | 0 refills | Status: DC | PRN

## 2022-04-20 MED ORDER — DOCUSATE SODIUM 100 MG PO CAPS
100.0000 mg | ORAL_CAPSULE | Freq: Two times a day (BID) | ORAL | Status: DC
  Administered 2022-04-20: 100 mg via ORAL
  Filled 2022-04-20: qty 1

## 2022-04-20 MED ORDER — ATORVASTATIN CALCIUM 80 MG PO TABS
80.0000 mg | ORAL_TABLET | Freq: Every day | ORAL | Status: DC
  Administered 2022-04-21 – 2022-04-28 (×8): 80 mg via ORAL
  Filled 2022-04-20 (×9): qty 1

## 2022-04-20 MED ORDER — PANTOPRAZOLE SODIUM 40 MG PO TBEC
40.0000 mg | DELAYED_RELEASE_TABLET | Freq: Every day | ORAL | Status: DC
  Administered 2022-04-20: 40 mg via ORAL
  Filled 2022-04-20: qty 1

## 2022-04-20 MED ORDER — BISACODYL 10 MG RE SUPP: 10.0000 mg | Freq: Every day | RECTAL | Status: DC | PRN

## 2022-04-20 MED ORDER — PROCHLORPERAZINE MALEATE 5 MG PO TABS: 5.0000 mg | ORAL_TABLET | Freq: Four times a day (QID) | ORAL | Status: DC | PRN

## 2022-04-20 MED ORDER — PANTOPRAZOLE SODIUM 40 MG PO TBEC: 40.0000 mg | DELAYED_RELEASE_TABLET | Freq: Every day | ORAL | Status: DC

## 2022-04-20 MED ORDER — POLYETHYLENE GLYCOL 3350 17 G PO PACK: 17.0000 g | PACK | Freq: Every day | ORAL | 0 refills | Status: DC

## 2022-04-20 MED ORDER — BUSPIRONE HCL 5 MG PO TABS
5.0000 mg | ORAL_TABLET | Freq: Three times a day (TID) | ORAL | Status: DC | PRN
  Administered 2022-04-20 – 2022-04-24 (×4): 5 mg via ORAL
  Filled 2022-04-20 (×5): qty 1

## 2022-04-20 MED ORDER — AMLODIPINE BESYLATE 2.5 MG PO TABS
2.5000 mg | ORAL_TABLET | Freq: Two times a day (BID) | ORAL | Status: DC
  Administered 2022-04-20 – 2022-04-23 (×6): 2.5 mg via ORAL
  Filled 2022-04-20 (×6): qty 1

## 2022-04-20 MED ORDER — MAGNESIUM OXIDE -MG SUPPLEMENT 400 (240 MG) MG PO TABS
200.0000 mg | ORAL_TABLET | Freq: Every day | ORAL | Status: DC
  Administered 2022-04-20: 200 mg via ORAL
  Filled 2022-04-20: qty 1

## 2022-04-20 MED ORDER — GUAIFENESIN-DM 100-10 MG/5ML PO SYRP: 5.0000 mL | ORAL_SOLUTION | Freq: Four times a day (QID) | ORAL | Status: DC | PRN

## 2022-04-20 MED ORDER — ACETAMINOPHEN 325 MG PO TABS
325.0000 mg | ORAL_TABLET | ORAL | Status: DC | PRN
  Administered 2022-04-22 – 2022-04-24 (×4): 650 mg via ORAL
  Filled 2022-04-20 (×5): qty 2

## 2022-04-20 MED ORDER — DOCUSATE SODIUM 100 MG PO CAPS: 100.0000 mg | ORAL_CAPSULE | Freq: Two times a day (BID) | ORAL | 0 refills | Status: DC

## 2022-04-20 MED ORDER — ALBUTEROL SULFATE HFA 108 (90 BASE) MCG/ACT IN AERS: 2.0000 | INHALATION_SPRAY | RESPIRATORY_TRACT | Status: DC | PRN

## 2022-04-20 MED ORDER — MELATONIN 5 MG PO TABS
5.0000 mg | ORAL_TABLET | Freq: Every evening | ORAL | Status: DC | PRN
  Administered 2022-04-20 – 2022-04-21 (×2): 5 mg via ORAL
  Filled 2022-04-20 (×2): qty 1

## 2022-04-20 MED ORDER — FERROUS SULFATE 325 (65 FE) MG PO TABS
325.0000 mg | ORAL_TABLET | Freq: Two times a day (BID) | ORAL | Status: DC
  Administered 2022-04-20: 325 mg via ORAL
  Filled 2022-04-20: qty 1

## 2022-04-20 MED ORDER — ACETAMINOPHEN 325 MG PO TABS: 650.0000 mg | ORAL_TABLET | ORAL | Status: DC | PRN

## 2022-04-20 MED ORDER — APIXABAN 5 MG PO TABS
5.0000 mg | ORAL_TABLET | Freq: Two times a day (BID) | ORAL | Status: DC
  Administered 2022-04-20 – 2022-04-26 (×12): 5 mg via ORAL
  Filled 2022-04-20 (×15): qty 1

## 2022-04-20 MED ORDER — PANTOPRAZOLE SODIUM 40 MG PO TBEC
40.0000 mg | DELAYED_RELEASE_TABLET | Freq: Every day | ORAL | Status: DC
  Administered 2022-04-21: 40 mg via ORAL
  Filled 2022-04-20: qty 1

## 2022-04-20 MED ORDER — METOPROLOL TARTRATE 12.5 MG HALF TABLET
12.5000 mg | ORAL_TABLET | Freq: Three times a day (TID) | ORAL | Status: DC
  Administered 2022-04-20 – 2022-04-28 (×21): 12.5 mg via ORAL
  Filled 2022-04-20 (×23): qty 1

## 2022-04-20 MED ORDER — PROCHLORPERAZINE 25 MG RE SUPP: 12.5000 mg | Freq: Four times a day (QID) | RECTAL | Status: DC | PRN

## 2022-04-20 MED ORDER — ATORVASTATIN CALCIUM 80 MG PO TABS: 80.0000 mg | ORAL_TABLET | Freq: Every day | ORAL | Status: DC

## 2022-04-20 MED ORDER — MELATONIN 3 MG PO TABS: 3.0000 mg | ORAL_TABLET | Freq: Every day | ORAL | Status: DC

## 2022-04-20 MED ORDER — ALBUTEROL SULFATE (2.5 MG/3ML) 0.083% IN NEBU: 2.5000 mg | INHALATION_SOLUTION | RESPIRATORY_TRACT | Status: DC | PRN

## 2022-04-20 MED ORDER — FUROSEMIDE 20 MG PO TABS
20.0000 mg | ORAL_TABLET | Freq: Two times a day (BID) | ORAL | Status: DC
  Administered 2022-04-20 – 2022-04-26 (×11): 20 mg via ORAL
  Filled 2022-04-20 (×14): qty 1

## 2022-04-20 MED ORDER — MELATONIN 3 MG PO TABS
3.0000 mg | ORAL_TABLET | Freq: Every evening | ORAL | Status: DC | PRN
  Administered 2022-04-20: 3 mg via ORAL
  Filled 2022-04-20: qty 1

## 2022-04-20 MED ORDER — BUPROPION HCL ER (XL) 300 MG PO TB24
300.0000 mg | ORAL_TABLET | Freq: Every morning | ORAL | Status: DC
  Administered 2022-04-21 – 2022-04-28 (×7): 300 mg via ORAL
  Filled 2022-04-20 (×10): qty 1

## 2022-04-20 MED ORDER — ALUM & MAG HYDROXIDE-SIMETH 200-200-20 MG/5ML PO SUSP: 30.0000 mL | ORAL | Status: DC | PRN

## 2022-04-20 MED ORDER — DIPHENHYDRAMINE HCL 12.5 MG/5ML PO ELIX: 12.5000 mg | ORAL_SOLUTION | Freq: Four times a day (QID) | ORAL | Status: DC | PRN

## 2022-04-20 MED ORDER — APIXABAN 5 MG PO TABS: 5.0000 mg | ORAL_TABLET | Freq: Two times a day (BID) | ORAL | Status: DC

## 2022-04-20 MED ORDER — POTASSIUM CHLORIDE CRYS ER 20 MEQ PO TBCR
20.0000 meq | EXTENDED_RELEASE_TABLET | Freq: Two times a day (BID) | ORAL | Status: DC
  Administered 2022-04-20 – 2022-04-21 (×2): 20 meq via ORAL
  Filled 2022-04-20 (×2): qty 1

## 2022-04-20 MED ORDER — PROCHLORPERAZINE EDISYLATE 10 MG/2ML IJ SOLN: 5.0000 mg | Freq: Four times a day (QID) | INTRAMUSCULAR | Status: DC | PRN

## 2022-04-20 MED ORDER — POLYETHYLENE GLYCOL 3350 17 G PO PACK: 17.0000 g | PACK | Freq: Every day | ORAL | Status: DC | PRN

## 2022-04-20 NOTE — H&P (Signed)
Physical Medicine and Rehabilitation Admission H&P        Chief Complaint  Patient presents with   Stroke with functional deficits      HPI: [name] is a 89 year old female with history of CAD s/p DES to RCA '10, AS s/p TVAR '19, HTN, AF (declined AC), CKD III, mild memory loss, recurrent breast cancer, depression/anxiety, hx of candida esophagitis, Covid 11/23;  who was admitted on 04/16/22 with left sided weakness, difficulty speaking and right gaze preference.  CTA head/neck showed R-MCA M1 occlusion with incdental multiple thyroid nodules. TNK administered and she underwent cerebral angio with thrombectomy. Post procedure hematoma treated with manual pressure and QuickClot and was on cleviprex for BP control. MRI brain done revealing acute infarcts in right temporal lobe, body of corpus callosum, right corona radiata and left centrum ovale with scattered areas of susceptibility question microhemorrhage or small volume pneumocephalus. She was noted to be Covid + but asymptomatic. She tolerated extubation and stroke felt to be embolic due to A fib. 2D showed EF 40-45% with global hypokinesis, moderate bilateral RA dilatation, small pericardial effusion and severe concentric LVH. She was weaned off cleviprex and Eliquis added today.    She has had issues with insomnia as well as SOB/DOE. ST evaluation showed deficits in memory, comprehension and attention. She required NRB with therapy yesterday but reported to be better today. She continues to be limited by right foot pain, left sided weakness, cognitive deficits requiring increased time for simple commands and difficulty with sequencing. CIR recommended due to functional deficits.     Pt reports R foot pain since off Voltaren pills- doesn't think Diclofenac gel helps; but likes aspercreme with 4% Lidocaine.  Some difficulty voiding today LBM this AM.    Had 24/7 caregivers since ~ 01/13/22.  Had [place] in 12/23.     Review of  Systems  Constitutional:  Negative for chills and fever.  HENT:  Positive for hearing loss.   Eyes:  Negative for blurred vision.  Respiratory:  Negative for cough.   Cardiovascular:  Negative for chest pain and palpitations.  Gastrointestinal:  Positive for diarrhea. Negative for heartburn.  Genitourinary:        Difficulty peeing some today  Musculoskeletal:  Negative for myalgias.  Neurological:  Positive for focal weakness. Negative for dizziness, speech change and headaches.  Psychiatric/Behavioral:  Positive for memory loss. The patient has insomnia.   All other systems reviewed and are negative.         Past Medical History:  Diagnosis Date   Abnormal gait 06/24/2015    Last Assessment & Plan:   Relevant Hx:  Course:  Daily Update:  Today's Plan:uses her cane for this and no recent falls     Electronically signed by: Mayer Camel, NP  07/23/15 1529   Acute cough 08/25/2011   Acute kidney injury superimposed on chronic kidney disease ([place]) 10/18/2020   Anemia due to stage 3 chronic kidney disease ([place]) 06/24/2015    Last Assessment & Plan: Formatting of this note might be different from the original. Relevant Hx: Course: Daily Update: Today's Plan:update her CMP for her Electronically signed by: Mayer Camel, NP 07/23/15 1528 Formatting of this note might be different from the original. Last Assessment & Plan: Relevant Hx: Course: Daily Update: Today's Plan:update her CMP for her Electronically    Anxiety 06/24/2015    Last Assessment & Plan:   Relevant Hx:  Course:  Daily Update:  Today's Plan:this is stable for her at this time     Electronically signed by: Mayer Camel, NP  07/23/15 1529   Aortic stenosis, severe      a. 07/2017: s/p TAVR w/ an Edwards Sapien 3 THV (size 26 mm, model # [id number], serial # [id number])   Atherosclerosis of coronary artery 06/24/2015    Formatting of this note might be different from the original. Ostial RCA  DES, 2010 Last Assessment & Plan: stable Last Assessment & Plan: Relevant Hx: Course: Daily Update: Today's Plan:she has felt she was stable from this and she has cardiologist she has followed with . Electronically signed by: Mayer Camel, NP 07/23/15 1523 Ostial RCA DES, 2010 Last Assessment & Plan: stable   Atrial fibrillation ([place]) 03/19/2022   Bilateral hearing loss 06/25/2015   Bilateral sensorineural hearing loss 12/26/2020   Breast cancer, right breast ([place]) 03/24/2008   CAD (coronary artery disease)      a. 2010: s/p stent to RCA   Chronic diastolic (congestive) heart failure ([place]) 08/04/2017   Chronic kidney disease, stage III (moderate) (H[facility])     Chronic recurrent major depressive disorder ([place]) 06/24/2015    Last Assessment & Plan:   Relevant Hx:  Course:  Daily Update:  Today's Plan:this is stable for her     Electronically signed by: Mayer Camel, NP  07/23/15 1530   Chronic sinusitis     Coronary artery disease involving native coronary artery of native heart with angina pectoris ([place]) 06/24/2015    Formatting of this note might be different from the original. Overview: Ostial RCA DES, 2010 Last Assessment & Plan: stable Last Assessment & Plan: Formatting of this note might be different from the original. Relevant Hx: Course: Daily Update: Today's Plan:she has felt she was stable from this and she has cardiologist she has followed with . Electronically signed by: Mayer Camel, N   Coronary artery disease involving native coronary artery of native heart without angina pectoris      Ostial RCA DES, 2010   Decreased hearing of both ears 06/25/2015   Deficiency anemia 03/11/2021   Dehydration 10/19/2020   Depressive disorder     Dizziness 08/14/2018   [place] of macula of both eyes 09/12/2018   Ductal carcinoma in situ of breast 06/23/2007   Encounter for long-term current use of high risk medication 06/24/2015   Epiretinal membrane (ERM) of  left eye 09/12/2018   Essential hypertension, benign 02/16/2013   Gastro-esophageal reflux disease with esophagitis 06/24/2015    Last Assessment & Plan:   Relevant Hx:  Course:  Daily Update:  Today's Plan:she is stable from this      Electronically signed by: Mayer Camel, NP  07/23/15 1526   Gastroesophageal reflux disease without esophagitis 12/27/2016   History of breast cancer      a. s/p lumpectomies and XRT   Hyperlipemia     Hyperlipidemia 02/16/2013   Hypertension     Hypothyroid     Hypothyroidism, acquired 06/24/2015    Last Assessment & Plan:   Relevant Hx:  Course:  Daily Update:  Today's Plan:update her TSH for her today     Electronically signed by: Mayer Camel, NP  07/23/15 1526   Incisional hernia, without obstruction or gangrene 02/24/2019   Insomnia     Intermediate stage nonexudative age-related macular degeneration of both eyes 01/19/2021   Irritation of oral cavity 11/23/2019   Malaise and fatigue 06/24/2015  Last Assessment & Plan:   Relevant Hx:  Course:  Daily Update:  Today's Plan:she is up and down with her energy and she feels that for the most part she has been stable     Electronically signed by: Mayer Camel, NP  07/23/15 1533   Malignant neoplasm of central portion of left female breast ([place]) 06/24/2015    Last Assessment & Plan:   Relevant Hx:  Course:  Daily Update:  Today's Plan:she is now receiving injections once a month for this and is hopeful will keep this at bay as this has been recurrent     Electronically signed by: Mayer Camel, NP  07/23/15 1530   Melena 03/19/2022   Memory change 07/23/2015    Last Assessment & Plan:   Relevant Hx:  Course:  Daily Update:  Today's Plan:she is going to take the aricept at supper     Electronically signed by: Mayer Camel, NP  07/23/15 1532   Memory loss     Multiple thyroid nodules 02/09/2012   Osteoarthritis     Osteoporosis 03/24/2020    Other allergic rhinitis 12/27/2016   Plantar fat pad atrophy 01/19/2016   Posterior vitreous detachment of left eye 09/12/2018   Primary insomnia 06/24/2015    Last Assessment & Plan:   Relevant Hx:  Course:  Daily Update:  Today's Plan:she feels that this is stable for her at this time     Electronically signed by: Mayer Camel, NP  07/23/15 1531   Primary localized osteoarthrosis, ankle and foot 01/15/2016   Primary osteoarthritis involving multiple joints 06/24/2015    Last Assessment & Plan:   Relevant Hx:  Course:  Daily Update:  Today's Plan:she is stable from her joint though she has discomfort if she overexerts.     Electronically signed by: Mayer Camel, NP  07/23/15 1528   Reflux esophagitis     S/P TAVR (transcatheter aortic valve replacement) 08/02/2017   Severe aortic stenosis 02/16/2013    Physician Review  Conclusions: 1. Mild concentric left ventricular hypertrophy.  2. Left ventricular ejection fraction estimated by 2D at 60-65 percent.  3. There were no regional wall motion abnormalities.  4. Mild mitral annular calcification.  5. Trace mitral valve regurgitation.  6. Trivial tricuspid regurgitation.  7. Moderate increased thickness and calcification of the trileaflet aortic val   Stage III chronic kidney disease ([place]) 06/24/2015    Last Assessment & Plan:   Relevant Hx:  Course:  Daily Update:  Today's Plan:update her CMP for her     Electronically signed by: Mayer Camel, NP  07/23/15 1528   Thyroid nodule 06/24/2015   Vitreous membranes and strands 01/19/2021    PPV on 02/11/2021 OS           Past Surgical History:  Procedure Laterality Date   ANGIOPLASTY       BIOPSY   10/21/2020    Procedure: BIOPSY;  Surgeon: Carol Ada, MD;  Location: [facility];  Service: Endoscopy;;   BREAST LUMPECTOMY        x2   ESOPHAGOGASTRODUODENOSCOPY Left 10/21/2020    Procedure: ESOPHAGOGASTRODUODENOSCOPY (EGD);  Surgeon: Carol Ada, MD;   Location: [facility];  Service: Endoscopy;  Laterality: Left;  Abnormal barium swallow   FOOT TENDON SURGERY       GALLBLADDER SURGERY       INCISIONAL HERNIA REPAIR N/A 03/01/2019    Procedure: OPEN INCISIONAL HERNIA REPAIR;  Surgeon: Armandina Gemma, MD;  Location: [facility];  Service: General;  Laterality: N/A;   INSERTION OF MESH N/A 03/01/2019    Procedure: INSERTION OF MESH;  Surgeon: Armandina Gemma, MD;  Location: [facility];  Service: General;  Laterality: N/A;   INTRAOPERATIVE TRANSTHORACIC ECHOCARDIOGRAM N/A 08/02/2017    Procedure: INTRAOPERATIVE TRANSTHORACIC ECHOCARDIOGRAM;  Surgeon: Burnell Blanks, MD;  Location: [facility];  Service: Open Heart Surgery;  Laterality: N/A;   IR CT HEAD LTD   04/16/2022   IR PERCUTANEOUS ART THROMBECTOMY/INFUSION INTRACRANIAL INC DIAG ANGIO   04/16/2022   RADIOLOGY WITH ANESTHESIA N/A 04/16/2022    Procedure: IR WITH ANESTHESIA;  Surgeon: Luanne Bras, MD;  Location: [facility];  Service: Radiology;  Laterality: N/A;   REPLACEMENT TOTAL KNEE BILATERAL       RIGHT/LEFT HEART CATH AND CORONARY ANGIOGRAPHY N/A 07/05/2017    Procedure: RIGHT/LEFT HEART CATH AND CORONARY ANGIOGRAPHY;  Surgeon: Belva Crome, MD;  Location: [facility];  Service: Cardiovascular;  Laterality: N/A;   SKIN CANCER EXCISION   2018    right nostril    TOTAL ABDOMINAL HYSTERECTOMY       TRANSCATHETER AORTIC VALVE REPLACEMENT, TRANSFEMORAL N/A 08/02/2017    Procedure: TRANSCATHETER AORTIC VALVE REPLACEMENT, TRANSFEMORAL;  Surgeon: Burnell Blanks, MD;  Location: [facility];  Service: Open Heart Surgery;  Laterality: N/A;           Family History  Problem Relation Age of Onset   Heart disease Father     Stroke Mother     Heart disease Maternal Grandfather     Breast cancer Sister        Social History: Lives in ALF with 24/7 caregivers for assitance with ADL and non ambulatory x for few steps.  Per  reports that she quit smoking about 58 years ago. Her smoking use included  cigarettes- 1/2 PPD She has a 10.00 pack-year smoking history. She has never used smokeless tobacco. She reports current alcohol use--mixed drink or glass of wine . She reports that she does not use drugs.          Allergies  Allergen Reactions   Latex Hives and Swelling      Other reaction(s): Unknown   Lisinopril Cough      Other reaction(s): Unknown   Povidone Iodine Other (See Comments)      Blisters   Other reaction(s): Unknown   Povidone-Iodine        Other Reaction(s): Unknown            Medications Prior to Admission  Medication Sig Dispense Refill   albuterol (VENTOLIN HFA) 108 (90 Base) MCG/ACT inhaler Inhale 2 puffs into the lungs every 4 (four) hours as needed for wheezing or shortness of breath. 1 each 12   amLODipine (NORVASC) 2.5 MG tablet Take 2.5 mg by mouth 2 (two) times daily.       buPROPion (WELLBUTRIN XL) 300 MG 24 hr tablet Take 300 mg by mouth every morning.       busPIRone (BUSPAR) 5 MG tablet Take 1 tablet (5 mg total) by mouth 3 (three) times daily as needed (anxiety). 30 tablet 0   cloNIDine (CATAPRES) 0.1 MG tablet Take 0.1 mg by mouth 2 (two) times daily.       diclofenac (VOLTAREN) 25 MG EC tablet Take 1 tablet (25 mg total) by mouth 2 (two) times daily as needed for moderate pain. 60 tablet 0   diphenoxylate-atropine (LOMOTIL) 2.5-0.025 MG tablet Take 1 tablet by mouth 4 (four) times daily as  needed for diarrhea or loose stools. 30 tablet 0   Eszopiclone 3 MG TABS Take 3 mg by mouth at bedtime.       FEROSUL 325 (65 Fe) MG tablet Take 325 mg by mouth 2 (two) times daily.       fluticasone (FLONASE) 50 MCG/ACT nasal spray Place 1 spray into both nostrils in the morning and at bedtime. 15.8 mL 0   furosemide (LASIX) 20 MG tablet Take 1 tablet (20 mg total) by mouth 2 (two) times daily. 60 tablet 12   Magnesium Oxide 500 MG CAPS Take 500 mg by mouth daily.       metoprolol succinate (TOPROL-XL) 25 MG 24 hr tablet Take 25 mg by mouth 2 (two) times daily.        Probiotic Product (ALIGN PO) Take 1 capsule by mouth daily.       doxycycline (MONODOX) 100 MG capsule Take 1 capsule (100 mg total) by mouth 2 (two) times daily. (Patient not taking: Reported on 04/17/2022) 20 capsule 0   levothyroxine (SYNTHROID, LEVOTHROID) 50 MCG tablet Take 50 mcg by mouth daily before breakfast.  (Patient not taking: Reported on 04/17/2022)          Home: Home Living Family/patient expects to be discharged to:: Private residence Living Arrangements: Alone Available Help at Discharge: Personal care attendant, Available 24 hours/day Type of Home: Independent living facility Home Access: Level entry [place]: One level Bathroom Shower/Tub: Gaffer, Architectural technologist: Handicapped height Home Equipment: Rollator (4 wheels)   Functional History: Prior Function Prior Level of Function : Needs assist Physical Assist : Mobility (physical), ADLs (physical) Mobility Comments: Since Dec could get up from bed by herself and get to seat on rollator with min guard A but was not walking ADLs Comments: supervision from hired caregivers   Functional Status:  Mobility: Bed Mobility Overal bed mobility: Needs Assistance Bed Mobility: Supine to Sit Rolling: Min assist Sidelying to sit: Mod assist, +2 for physical assistance, HOB elevated General bed mobility comments: good initiation by pt, minA with bed pad to complete scoot. hand over hand guidance for use of bed rail Transfers Overall transfer level: Needs assistance Equipment used: Rolling walker (2 wheels) Transfers: Sit to/from Stand, Bed to chair/wheelchair/BSC Sit to Stand: Mod assist, +2 physical assistance Bed to/from chair/wheelchair/BSC transfer type:: Step pivot Step pivot transfers: Mod assist, +2 physical assistance General transfer comment: Pt pulling up on walker with PT stabilizing, bilateral anterior foot block provided. Pt with increased trunk/hip flexion. Step by step cues for  sequencing/direction. Improved L foot clearance Ambulation/Gait General Gait Details: unable this date   ADL: ADL Overall ADL's : Needs assistance/impaired Eating/Feeding: Set up, Supervision/ safety, Sitting Eating/Feeding Details (indicate cue type and reason): She did ask her son to feed her after she had fed herself ~1/2 her omelet Grooming: Set up, Supervision/safety, Sitting Upper Body Bathing: Set up, Supervision/ safety, Sitting Lower Body Bathing: Total assistance Lower Body Bathing Details (indicate cue type and reason): Mod A +2 sit<>stand Upper Body Dressing : Moderate assistance, Sitting Lower Body Dressing: Total assistance Lower Body Dressing Details (indicate cue type and reason): Mod A +2 sit<>stand Toilet Transfer: Moderate assistance, +2 for physical assistance, Stand-pivot, Rolling walker (2 wheels) Toilet Transfer Details (indicate cue type and reason): simulated bed>recliner next to bed on right Toileting- Clothing Manipulation and Hygiene: Total assistance Toileting - Clothing Manipulation Details (indicate cue type and reason): Mod A +2 sit<>stand General ADL Comments: Educated son and dtr in  law in room about the options for AIR, SNF, HH   Cognition: Cognition Overall Cognitive Status: Impaired/Different from baseline Arousal/Alertness: Awake/alert Orientation Level: Oriented to person, Oriented to place, Oriented to situation, Disoriented to time Year:  (9518) Month: February Day of Week: Incorrect Attention: Focused, Sustained Focused Attention: Appears intact Sustained Attention: Impaired Sustained Attention Impairment: Verbal basic Memory: Impaired Memory Impairment: Storage deficit, Decreased short term memory Decreased Short Term Memory: Verbal basic Awareness: Impaired Awareness Impairment: Emergent impairment Problem Solving: Impaired Problem Solving Impairment: Verbal complex Executive Function: Reasoning Reasoning:  Impaired Cognition Arousal/Alertness: Awake/alert Behavior During Therapy: WFL for tasks assessed/performed Overall Cognitive Status: Impaired/Different from baseline Area of Impairment: Following commands, Safety/judgement, Problem solving, Memory Memory: Decreased recall of precautions, Decreased short-term memory Following Commands: Follows one step commands with increased time Safety/Judgement: Decreased awareness of safety, Decreased awareness of deficits Problem Solving: Slow processing, Decreased initiation, Difficulty sequencing, Requires verbal cues, Requires tactile cues General Comments: Impacted by [facility]     Blood pressure 121/72, pulse (!) 103, temperature 98 F (36.7 C), temperature source Oral, resp. rate 16, height '4\' 11"'$  (1.499 m), weight 64.6 kg, SpO2 99 %. Physical Exam Vitals and nursing note reviewed. Exam conducted with a chaperone present.  Constitutional:      Appearance: Normal appearance.     Comments: Increase WOB with minimal conversation. HOH Frail appearing; very focused on eating her meal; daughter, son and son in law at bedside; alert, awake, NAD  HENT:     Head: Normocephalic and atraumatic.     Comments: L facial droop Tongue midline    Right Ear: External ear normal.     Left Ear: External ear normal.     Nose: Nose normal. No congestion.     Mouth/Throat:     Mouth: Mucous membranes are moist.     Pharynx: Oropharynx is clear. No oropharyngeal exudate.  Eyes:     General:        Right eye: No discharge.        Left eye: No discharge.     Extraocular Movements: Extraocular movements intact.  Cardiovascular:     Rate and Rhythm: Normal rate. Rhythm irregular.     Heart sounds: Murmur heard.     No gallop.  Pulmonary:     Effort: Pulmonary effort is normal. No respiratory distress.     Breath sounds: Normal breath sounds. No wheezing, rhonchi or rales.     Comments: Increased WOB at times with conversation.  Abdominal:     General: Bowel  sounds are normal. There is no distension.     Palpations: Abdomen is soft.     Tenderness: There is no abdominal tenderness.  Genitourinary:    Comments: Purewick in place, but no urine in cannister Musculoskeletal:     Cervical back: Neck supple. No tenderness.     Comments: RUE 5-/5 LUE Biceps, triceps, WE,  grip and FA 4/5- but difficult to get her to comply with exam RLE HF 2/5; DF/PF 4+/5 LLE- HF 2/5; couldn't do knee testing B/L; DF/PF 4-/5   Skin:    General: Skin is warm and dry.     Comments: Well healed long scar right foot medially. Diffuse ecchymosis right hand.  R groin has dressing from thrombectomy- no swelling other than mild; but has sanguinous drainage on dressing- ~25% saturated dressing 2+ LE edema to mid calf B/L   Neurological:     Mental Status: She is alert.     Comments: Was oriented  to self, place, age, DOB, month, year. Situation "fall". Unable to recall medical history and started getting worse as conversation progressed. She was able to follow simple motor commands. Mild left facial weakness with soft voice and mild dysarthria. Left sided weakness noted. Decreased ROM right foot. Decreased to sensation on left side Also has moderate neglect on L side- impaired exam  Psychiatric:     Comments: Flat; not oriented        Lab Results Last 48 Hours        Results for orders placed or performed during the hospital encounter of 04/16/22 (from the past 48 hour(s))  CBC with Differential/Platelet     Status: Abnormal    Collection Time: 04/19/22  3:25 AM  Result Value Ref Range    WBC 6.4 4.0 - 10.5 K/uL    RBC 3.07 (L) 3.87 - 5.11 MIL/uL    Hemoglobin 8.5 (L) 12.0 - 15.0 g/dL    HCT 26.5 (L) 36.0 - 46.0 %    MCV 86.3 80.0 - 100.0 fL    MCH 27.7 26.0 - 34.0 pg    MCHC 32.1 30.0 - 36.0 g/dL    RDW 22.2 (H) 11.5 - 15.5 %    Platelets 194 150 - 400 K/uL    nRBC 0.0 0.0 - 0.2 %    Neutrophils Relative % 67 %    Neutro Abs 4.4 1.7 - 7.7 K/uL     Lymphocytes Relative 20 %    Lymphs Abs 1.3 0.7 - 4.0 K/uL    Monocytes Relative 10 %    Monocytes Absolute 0.6 0.1 - 1.0 K/uL    Eosinophils Relative 1 %    Eosinophils Absolute 0.1 0.0 - 0.5 K/uL    Basophils Relative 1 %    Basophils Absolute 0.0 0.0 - 0.1 K/uL    Immature Granulocytes 1 %    Abs Immature Granulocytes 0.04 0.00 - 0.07 K/uL    Tear Drop Cells PRESENT      Ovalocytes PRESENT        Comment: Performed at [facility], [address] [place], [place] [address]  Basic metabolic panel     Status: Abnormal    Collection Time: 04/19/22  3:25 AM  Result Value Ref Range    Sodium 137 135 - 145 mmol/L    Potassium 3.4 (L) 3.5 - 5.1 mmol/L    Chloride 107 98 - 111 mmol/L    CO2 23 22 - 32 mmol/L    Glucose, Bld 105 (H) 70 - 99 mg/dL      Comment: Glucose reference range applies only to samples taken after fasting for at least 8 hours.    BUN 14 8 - 23 mg/dL    Creatinine, Ser 0.97 0.44 - 1.00 mg/dL    Calcium 8.9 8.9 - 10.3 mg/dL    GFR, Estimated 54 (L) >60 mL/min      Comment: (NOTE) Calculated using the CKD-EPI Creatinine Equation (2021)      Anion gap 7 5 - 15      Comment: Performed at [facility] [address], [place], [place] [address]  Magnesium     Status: Abnormal    Collection Time: 04/19/22  3:25 AM  Result Value Ref Range    Magnesium 1.6 (L) 1.7 - 2.4 mg/dL      Comment: Performed at [facility] [address] [place], [place] [address]  Phosphorus     Status: None  Collection Time: 04/19/22  3:25 AM  Result Value Ref Range    Phosphorus 3.1 2.5 - 4.6 mg/dL      Comment: Performed at [facility], [place] [address], [place], [place] [address]  CBC with Differential/Platelet     Status: Abnormal    Collection Time: 04/20/22  6:18 AM  Result Value Ref Range    WBC 9.1 4.0 - 10.5 K/uL    RBC 3.47 (L) 3.87 - 5.11 MIL/uL    Hemoglobin 9.3 (L) 12.0 - 15.0 g/dL    HCT 30.4 (L) 36.0 - 46.0 %    MCV 87.6 80.0 - 100.0 fL     MCH 26.8 26.0 - 34.0 pg    MCHC 30.6 30.0 - 36.0 g/dL    RDW 21.8 (H) 11.5 - 15.5 %    Platelets 227 150 - 400 K/uL    nRBC 0.0 0.0 - 0.2 %    Neutrophils Relative % 73 %    Neutro Abs 6.7 1.7 - 7.7 K/uL    Lymphocytes Relative 15 %    Lymphs Abs 1.4 0.7 - 4.0 K/uL    Monocytes Relative 9 %    Monocytes Absolute 0.8 0.1 - 1.0 K/uL    Eosinophils Relative 2 %    Eosinophils Absolute 0.1 0.0 - 0.5 K/uL    Basophils Relative 1 %    Basophils Absolute 0.1 0.0 - 0.1 K/uL    WBC Morphology VACUOLATED NEUTROPHILS      RBC Morphology See Note      Smear Review PLATELETS APPEAR ADEQUATE      Immature Granulocytes 0 %    Abs Immature Granulocytes 0.04 0.00 - 0.07 K/uL    Ovalocytes PRESENT        Comment: Performed at [facility], [place] [address], [place], [place] [address]  Basic metabolic panel     Status: Abnormal    Collection Time: 04/20/22  6:18 AM  Result Value Ref Range    Sodium 135 135 - 145 mmol/L    Potassium 3.4 (L) 3.5 - 5.1 mmol/L    Chloride 104 98 - 111 mmol/L    CO2 25 22 - 32 mmol/L    Glucose, Bld 107 (H) 70 - 99 mg/dL      Comment: Glucose reference range applies only to samples taken after fasting for at least 8 hours.    BUN 12 8 - 23 mg/dL    Creatinine, Ser 1.03 (H) 0.44 - 1.00 mg/dL    Calcium 8.8 (L) 8.9 - 10.3 mg/dL    GFR, Estimated 50 (L) >60 mL/min      Comment: (NOTE) Calculated using the CKD-EPI Creatinine Equation (2021)      Anion gap 6 5 - 15      Comment: Performed at [facility] [address], [place], [place] [address]  Magnesium     Status: Abnormal    Collection Time: 04/20/22  6:18 AM  Result Value Ref Range    Magnesium 1.6 (L) 1.7 - 2.4 mg/dL      Comment: Performed at [facility] [address], [place], [place] [address]  Phosphorus     Status: None    Collection Time: 04/20/22  6:18 AM  Result Value Ref Range    Phosphorus 3.4 2.5 - 4.6 mg/dL      Comment: Performed at [facility] [address], [place], [place] [address]      Imaging  Results (Last 48 hours)  No results found.         Blood pressure 121/72, pulse (!) 103, temperature 98 F (36.7 C), temperature source Oral, resp. rate 16, height '4\' 11"'$  (1.499 m), weight 64.6 kg, SpO2 99 %.   Medical Problem List and Plan: 1. 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  Antithrombotics: -DVT/anticoagulation:  Pharmaceutical: Eliquis--resumed 12/05             -antiplatelet therapy: N/A 3. Pain Management:  N/A 4. Mood/Behavior/Sleep:  LCSW to follow for evaluation and support.              -antipsychotic agents: N/A             --sleep wake chart. Will add melatonin 10 mg/hs 5. Neuropsych/cognition: This patient is not capable of making decisions on his  own behalf.             --had mild cognitive impairments at baseline. Will add delirum precautions - had 24/7 caregivers at home  6. Skin/Wound Care: Routine pressure relief measures 7. Fluids/Electrolytes/Nutrition: Monitor I/O. Check CMET in am. 8. Chronic diastolic HF/global hypokinesis: EF-45-50% with severe concentric LVH --Monitor weights daily and for signs of overload --Lasix BID, metoprolol, Lipitor  9. Covid 11/23/Hypoxia: At baseline had SOB/DOE w/activity--few steps -- Add flutter valve and encourage pulmonary hygiene.  --has been requiring NRB for activity 10. A fib: Monitor HR TID--on metoprolol and eliquis 11.  HTN: Monitor BP TID--on Metoprolol, Clonidine,  12. Acute on chronic renal failure: will check in AM 13.  Persistent hypokalemia: Supplement today-- 14. Hypomagnesemia: Mg- 1.6 --will supplement today 15. Acute on chronic anemia/Groin hematoma: Hgb 11.1--->9.3             --has had melanotic stools past month and referred to Dr. Benson Norway 16. Left breast cancer: Has declined surgery and stop Fulvestrant due to  SE.  17. H/o chronic Diarrhea: Liquid stools documented-->d/c colace and miralax.  --PTA felt to be anxiety related.  18. Chronic insomnia: Used Lunesta followed by Melatonin 10 mg every night per son.  19. Anxiety/depression: Back on Wellbutrin with buspar tid prn.  20. Urinary retention?: Will order PVR/bladder scans.- hadn't voided this AM    I have personally performed a face to face diagnostic evaluation of this patient and formulated the key components of the plan.  Additionally, I have personally reviewed laboratory data, imaging studies, as well as relevant notes and concur with the physician assistant's documentation above.   The patient's status has not changed from the original H&P.  Any changes in documentation from the acute care chart have been noted above.       Bary Leriche, PA-C 04/20/2022

## 2022-04-20 NOTE — Progress Notes (Signed)
PMR Admission Coordinator Pre-Admission Assessment   Patient: [name] is an 89 y.o., female MRN: [id number] DOB: [birth year] Height: '4\' 11"'$  (149.9 cm) Weight: 64.6 kg   Insurance Information HMO:     PPO:      PCP:      IPA:      80/20:      OTHER:  PRIMARY: medicare a/b      Policy#: [id number]      Subscriber: pt CM Name:       Phone#:      Fax#:  Pre-Cert#:       Employer:  Benefits:  Phone #:      Name:  Eff. Date: A 06/13/92, B 07/13/92     Deduct: $5726      Out of Pocket Max: n/a      Life Max: n/a CIR: 100%      SNF: 20 full days  Outpatient: 80%     Co-Pay: 20% Home Health: 100%      Co-Pay:  DME: 80%     Co-Pay: 20% Providers:  [employer] [place]      Policy#: [id number]     Phone#: [phone]   Financial Counselor:       Phone#:    The "Data Collection Information Summary" for patients in Inpatient Rehabilitation Facilities with attached "Privacy Act [place] Records" was provided and verbally reviewed with: Family   Emergency Contact Information Contact Information       Name Relation Home Work Mobile    [name] [phone]   [phone]    [name], [name]     [phone]           Current Medical History  Patient Admitting Diagnosis: CVA    History of Present Illness: Pt is a 89 y/o female wit hPMH of afib (not on AC), aortic stenosis s/p TAVR, CAD, CKD stage III, depression, breast cancer, HTN, hypothyroidism, anxiety, and arthritis admitted to [facility] on 04/16/22 with c/o L sided weakness, L neglect, R gaze, dysarthria, and chest pain.  On ED arrival, BP 177/102, O2 99% on room air.  Labs unremarkable except for glucose 176, creatinine 1.61 (baseline 1.1 in June), and potassium 3.3.  CT head negative, CTA head/neck showed LVO in right MCA and mild stenosis in L ACA.  TNK was administered and pt underwent thrombectomy per IR.  Tight BP control with cleviprex.  F/U CT showed no hemorrhage.  MRI showed R MCA  infarcts with chronic micro hemorrhages.  2D echo showed mild pericardial effusion and LVEF 45-50%.  Recommendations for eliquis which started on 2/5.  Therapy ongoing and pt recommended for CIR due to functional decline.  Complete NIHSS TOTAL: 3   Patient's medical record from [name] has been reviewed by the rehabilitation admission coordinator and physician.   Past Medical History      Past Medical History:  Diagnosis Date   Abnormal gait 06/24/2015    Last Assessment & Plan:   Relevant Hx:  Course:  Daily Update:  Today's Plan:uses her cane for this and no recent falls     Electronically signed by: Mayer Camel, NP  07/23/15 1529   Acute cough 08/25/2011   Acute kidney injury superimposed on chronic kidney disease ([place]) 10/18/2020   Anemia due to stage 3 chronic kidney disease ([place]) 06/24/2015    Last Assessment & Plan: Formatting of this note might be different from the original. Relevant Hx: Course: Daily Update: Today's Plan:update her CMP  for her Electronically signed by: Mayer Camel, NP 07/23/15 1528 Formatting of this note might be different from the original. Last Assessment & Plan: Relevant Hx: Course: Daily Update: Today's Plan:update her CMP for her Electronically    Anxiety 06/24/2015    Last Assessment & Plan:   Relevant Hx:  Course:  Daily Update:  Today's Plan:this is stable for her at this time     Electronically signed by: Mayer Camel, NP  07/23/15 1529   Aortic stenosis, severe      a. 07/2017: s/p TAVR w/ an Edwards Sapien 3 THV (size 26 mm, model # [id number], serial # [id number])   Atherosclerosis of coronary artery 06/24/2015    Formatting of this note might be different from the original. Ostial RCA DES, 2010 Last Assessment & Plan: stable Last Assessment & Plan: Relevant Hx: Course: Daily Update: Today's Plan:she has felt she was stable from this and she has cardiologist she has followed with . Electronically signed by: Mayer Camel, NP 07/23/15 1523 Ostial RCA DES, 2010 Last Assessment & Plan: stable   Atrial fibrillation ([place]) 03/19/2022   Bilateral hearing loss 06/25/2015   Bilateral sensorineural hearing loss 12/26/2020   Breast cancer, right breast ([place]) 03/24/2008   CAD (coronary artery disease)      a. 2010: s/p stent to RCA   Chronic diastolic (congestive) heart failure ([place]) 08/04/2017   Chronic kidney disease, stage III (moderate) (HCC)     Chronic recurrent major depressive disorder ([place]) 06/24/2015    Last Assessment & Plan:   Relevant Hx:  Course:  Daily Update:  Today's Plan:this is stable for her     Electronically signed by: Mayer Camel, NP  07/23/15 1530   Chronic sinusitis     Coronary artery disease involving native coronary artery of native heart with angina pectoris ([place]) 06/24/2015    Formatting of this note might be different from the original. Overview: Ostial RCA DES, 2010 Last Assessment & Plan: stable Last Assessment & Plan: Formatting of this note might be different from the original. Relevant Hx: Course: Daily Update: Today's Plan:she has felt she was stable from this and she has cardiologist she has followed with . Electronically signed by: Mayer Camel, N   Coronary artery disease involving native coronary artery of native heart without angina pectoris      Ostial RCA DES, 2010   Decreased hearing of both ears 06/25/2015   Deficiency anemia 03/11/2021   Dehydration 10/19/2020   Depressive disorder     Dizziness 08/14/2018   [place] of macula of both eyes 09/12/2018   Ductal carcinoma in situ of breast 06/23/2007   Encounter for long-term current use of high risk medication 06/24/2015   Epiretinal membrane (ERM) of left eye 09/12/2018   Essential hypertension, benign 02/16/2013   Gastro-esophageal reflux disease with esophagitis 06/24/2015    Last Assessment & Plan:   Relevant Hx:  Course:  Daily Update:  Today's Plan:she is stable from this       Electronically signed by: Mayer Camel, NP  07/23/15 1526   Gastroesophageal reflux disease without esophagitis 12/27/2016   History of breast cancer      a. s/p lumpectomies and XRT   Hyperlipemia     Hyperlipidemia 02/16/2013   Hypertension     Hypothyroid     Hypothyroidism, acquired 06/24/2015    Last Assessment & Plan:   Relevant Hx:  Course:  Daily Update:  Today's Plan:update her TSH for her  today     Electronically signed by: Mayer Camel, NP  07/23/15 1526   Incisional hernia, without obstruction or gangrene 02/24/2019   Insomnia     Intermediate stage nonexudative age-related macular degeneration of both eyes 01/19/2021   Irritation of oral cavity 11/23/2019   Malaise and fatigue 06/24/2015    Last Assessment & Plan:   Relevant Hx:  Course:  Daily Update:  Today's Plan:she is up and down with her energy and she feels that for the most part she has been stable     Electronically signed by: Mayer Camel, NP  07/23/15 1533   Malignant neoplasm of central portion of left female breast ([place]) 06/24/2015    Last Assessment & Plan:   Relevant Hx:  Course:  Daily Update:  Today's Plan:she is now receiving injections once a month for this and is hopeful will keep this at bay as this has been recurrent     Electronically signed by: Mayer Camel, NP  07/23/15 1530   Melena 03/19/2022   Memory change 07/23/2015    Last Assessment & Plan:   Relevant Hx:  Course:  Daily Update:  Today's Plan:she is going to take the aricept at supper     Electronically signed by: Mayer Camel, NP  07/23/15 1532   Memory loss     Multiple thyroid nodules 02/09/2012   Osteoarthritis     Osteoporosis 03/24/2020   Other allergic rhinitis 12/27/2016   Plantar fat pad atrophy 01/19/2016   Posterior vitreous detachment of left eye 09/12/2018   Primary insomnia 06/24/2015    Last Assessment & Plan:   Relevant Hx:  Course:  Daily Update:   Today's Plan:she feels that this is stable for her at this time     Electronically signed by: Mayer Camel, NP  07/23/15 1531   Primary localized osteoarthrosis, ankle and foot 01/15/2016   Primary osteoarthritis involving multiple joints 06/24/2015    Last Assessment & Plan:   Relevant Hx:  Course:  Daily Update:  Today's Plan:she is stable from her joint though she has discomfort if she overexerts.     Electronically signed by: Mayer Camel, NP  07/23/15 1528   Reflux esophagitis     S/P TAVR (transcatheter aortic valve replacement) 08/02/2017   Severe aortic stenosis 02/16/2013    Physician Review  Conclusions: 1. Mild concentric left ventricular hypertrophy.  2. Left ventricular ejection fraction estimated by 2D at 60-65 percent.  3. There were no regional wall motion abnormalities.  4. Mild mitral annular calcification.  5. Trace mitral valve regurgitation.  6. Trivial tricuspid regurgitation.  7. Moderate increased thickness and calcification of the trileaflet aortic val   Stage III chronic kidney disease ([place]) 06/24/2015    Last Assessment & Plan:   Relevant Hx:  Course:  Daily Update:  Today's Plan:update her CMP for her     Electronically signed by: Mayer Camel, NP  07/23/15 1528   Thyroid nodule 06/24/2015   Vitreous membranes and strands 01/19/2021    PPV on 02/11/2021 OS      Has the patient had major surgery during 100 days prior to admission? No   Family History   family history includes Breast cancer in her sister; Heart disease in her father and maternal grandfather; Stroke in her mother.   Current Medications   Current Facility-Administered Medications:    acetaminophen (TYLENOL) tablet 650 mg, 650 mg, Oral, Q4H PRN, 650 mg at 04/19/22 0425 **OR** acetaminophen (  TYLENOL) 160 MG/5ML solution 650 mg, 650 mg, Per Tube, Q4H PRN, 650 mg at 04/19/22 1045 **OR** acetaminophen (TYLENOL) suppository 650 mg, 650 mg, Rectal, Q4H PRN, Deveshwar,  Sanjeev, MD   amLODipine (NORVASC) tablet 2.5 mg, 2.5 mg, Oral, BID, Bhagat, Srishti L, MD, 2.5 mg at 04/20/22 0859   apixaban (ELIQUIS) tablet 5 mg, 5 mg, Oral, BID, Shafer, Devon, NP, 5 mg at 04/20/22 5537   aspirin tablet 325 mg, 325 mg, Oral, Once, Thressa Sheller, Erin C, NP   atorvastatin (LIPITOR) tablet 80 mg, 80 mg, Oral, Daily, Thressa Sheller, Erin C, NP, 80 mg at 04/20/22 0859   buPROPion (WELLBUTRIN XL) 24 hr tablet 300 mg, 300 mg, Oral, q morning, Thressa Sheller, Erin C, NP, 300 mg at 04/20/22 0858   busPIRone (BUSPAR) tablet 5 mg, 5 mg, Oral, TID PRN, Lovey Newcomer C, NP, 5 mg at 04/19/22 2129   Chlorhexidine Gluconate Cloth 2 % PADS 6 each, 6 each, Topical, Daily, Candee Furbish, MD, 6 each at 04/19/22 1046   docusate sodium (COLACE) capsule 100 mg, 100 mg, Oral, BID, Ayesha Rumpf, Stephanie M, PA-C, 100 mg at 04/20/22 1017   ferrous sulfate tablet 325 mg, 325 mg, Oral, BID WC, Palikh, Gaurang M, MD, 325 mg at 04/20/22 0900   furosemide (LASIX) tablet 20 mg, 20 mg, Oral, BID, Shafer, Devon, NP, 20 mg at 04/20/22 0859   melatonin tablet 3 mg, 3 mg, Oral, QHS PRN, Bhagat, Srishti L, MD, 3 mg at 04/20/22 0232   metoprolol tartrate (LOPRESSOR) tablet 12.5 mg, 12.5 mg, Oral, Q8H, Bhagat, Srishti L, MD, 12.5 mg at 04/20/22 1247   pantoprazole (PROTONIX) EC tablet 40 mg, 40 mg, Oral, Daily, Palikh, Gaurang M, MD, 40 mg at 04/20/22 1248   polyethylene glycol (MIRALAX / GLYCOLAX) packet 17 g, 17 g, Per Tube, Daily, [place] Crane M, PA-C, 17 g at 04/20/22 4827   Patients Current Diet:  Diet Order                  Diet regular Room service appropriate? Yes with Assist; Fluid consistency: Thin  Diet effective now                         Precautions / Restrictions Precautions Precautions: Fall Precaution Comments: NRB Restrictions Weight Bearing Restrictions: No    Has the patient had 2 or more falls or a fall with injury in the past year? No   Prior Activity Level Household: limited household mobility  at baseline, using rollator, supervision for all mobility and ADLs at baseline   Prior Functional Level Self Care: Did the patient need help bathing, dressing, using the toilet or eating? Needed some help   Indoor Mobility: Did the patient need assistance with walking from room to room (with or without device)? Needed some help   Stairs: Did the patient need assistance with internal or external stairs (with or without device)? Needed some help   Functional Cognition: Did the patient need help planning regular tasks such as shopping or remembering to take medications? Needed some help   Patient Information Are you of Hispanic, Latino/a,or Spanish origin?: X. Patient unable to respond, A. No, not of Hispanic, Latino/a, or Spanish origin (son) What is your race?: X. Patient unable to respond, A. White (per son) Do you need or want an interpreter to communicate with a doctor or health care staff?: 0. No   Patient's Response To:  Health Literacy and Transportation Is the patient able to  respond to health literacy and transportation needs?: No Health Literacy - How often do you need to have someone help you when you read instructions, pamphlets, or other written material from your doctor or pharmacy?: Patient unable to respond In the past 12 months, has lack of transportation kept you from medical appointments or from getting medications?: No (per son) In the past 12 months, has lack of transportation kept you from meetings, work, or from getting things needed for daily living?: No (per son)   Development worker, international aid / [place] Devices/Equipment: Environmental consultant (specify type) (rolling) Home Equipment: Rollator (4 wheels)   Prior Device Use: Indicate devices/aids used by the patient prior to current illness, exacerbation or injury? Walker   Current Functional Level Cognition   Arousal/Alertness: Awake/alert Overall Cognitive Status: Impaired/Different from baseline Orientation Level:  Oriented to person, Oriented to place, Oriented to situation, Disoriented to time Following Commands: Follows one step commands with increased time Safety/Judgement: Decreased awareness of safety, Decreased awareness of deficits General Comments: Impacted by [facility] Attention: Focused, Sustained Focused Attention: Appears intact Sustained Attention: Impaired Sustained Attention Impairment: Verbal basic Memory: Impaired Memory Impairment: Storage deficit, Decreased short term memory Decreased Short Term Memory: Verbal basic Awareness: Impaired Awareness Impairment: Emergent impairment Problem Solving: Impaired Problem Solving Impairment: Verbal complex Executive Function: Reasoning Reasoning: Impaired    Extremity Assessment (includes Sensation/Coordination)   Upper Extremity Assessment: Generalized weakness  Lower Extremity Assessment: Generalized weakness (no bilat knee buckling in standing, grossly minimally 3/5 however needed assist with advance L LE)     ADLs   Overall ADL's : Needs assistance/impaired Eating/Feeding: Set up, Supervision/ safety, Sitting Eating/Feeding Details (indicate cue type and reason): She did ask her son to feed her after she had fed herself ~1/2 her omelet Grooming: Set up, Supervision/safety, Sitting Upper Body Bathing: Set up, Supervision/ safety, Sitting Lower Body Bathing: Total assistance Lower Body Bathing Details (indicate cue type and reason): Mod A +2 sit<>stand Upper Body Dressing : Moderate assistance, Sitting Lower Body Dressing: Total assistance Lower Body Dressing Details (indicate cue type and reason): Mod A +2 sit<>stand Toilet Transfer: Moderate assistance, +2 for physical assistance, Stand-pivot, Rolling walker (2 wheels) Toilet Transfer Details (indicate cue type and reason): simulated bed>recliner next to bed on right Toileting- Clothing Manipulation and Hygiene: Total assistance Toileting - Clothing Manipulation Details (indicate cue  type and reason): Mod A +2 sit<>stand General ADL Comments: Educated son and dtr in law in room about the options for AIR, SNF, HH     Mobility   Overal bed mobility: Needs Assistance Bed Mobility: Supine to Sit Rolling: Min assist Sidelying to sit: Mod assist, +2 for physical assistance, HOB elevated General bed mobility comments: OOB in chair     Transfers   Overall transfer level: Needs assistance Equipment used: Rolling walker (2 wheels) Transfers: Sit to/from Stand, Bed to chair/wheelchair/BSC Sit to Stand: Mod assist Bed to/from chair/wheelchair/BSC transfer type:: Step pivot Step pivot transfers: Mod assist, +2 physical assistance General transfer comment: ModA to boost from chair x 2     Ambulation / Gait / Stairs / Wheelchair Mobility   Ambulation/Gait Ambulation/Gait assistance: Min assist, +2 safety/equipment Gait Distance (Feet): 40 Feet (20", 20") Assistive device: Rolling walker (2 wheels) Gait Pattern/deviations: Decreased stride length, Step-to pattern, Trunk flexed General Gait Details: Min cues for stepping initiation, sequencing, L quad activation. MinA for balance and chair follow utilized. 1 rest break in between bouts Gait velocity: decreased Gait velocity interpretation: <1.8 ft/sec, indicate of risk  for recurrent falls     Posture / Balance Balance Overall balance assessment: Needs assistance Sitting-balance support: Feet supported, No upper extremity supported Sitting balance-Leahy Scale: Fair Standing balance support: Bilateral upper extremity supported Standing balance-Leahy Scale: Poor Standing balance comment: reliant on RW     Special needs/care consideration Oxygen n/a    Previous Home Environment (from acute therapy documentation) Living Arrangements: Alone Available Help at Discharge: Personal care attendant, Available 24 hours/day Type of Home: Independent living facility Home Layout: One level Home Access: Level entry Bathroom  Shower/Tub: Walk-in shower, Architectural technologist: Handicapped height   Discharge Living Setting Plans for Discharge Living Setting: Patient's home, Other (Comment) (ILF) Type of Home at Discharge: [place] Name at Discharge: [facility] Discharge Home Layout: One level Discharge Home Access: Level entry Discharge Bathroom Shower/Tub: Walk-in shower Discharge Bathroom Toilet: Handicapped height Discharge Bathroom Accessibility: Yes How Accessible: Accessible via walker Does the patient have any problems obtaining your medications?: No   Social/Family/Support Systems Anticipated Caregiver: hired caregivers will provide 24/7 care as at baseline, spoke to [place] (son) at bedside Anticipated Caregiver's Contact Information: Harrington Challenger [phone] Ability/Limitations of Caregiver: n/a Caregiver Availability: 24/7 Discharge Plan Discussed with Primary Caregiver: Yes Is Caregiver In Agreement with Plan?: Yes Does Caregiver/Family have Issues with Lodging/Transportation while Pt is in Rehab?: No   Goals Patient/Family Goal for Rehab: PT/OT/SLP min assist to supervision Expected length of stay: 12-14 days Additional Information: Plan to return to [facility] with 24/7 hired caregivers as is her baseline.  Verified Medicare, pt has not used any SNF days Pt/Family Agrees to Admission and willing to participate: Yes Program Orientation Provided & Reviewed with Pt/Caregiver Including Roles  & Responsibilities: Yes   Decrease burden of Care through IP rehab admission: n/a   Possible need for SNF placement upon discharge: not anticipated.  Pt has 24/7 hired caregivers at baseline and pt's son, Harrington Challenger, confirms the plan to continue with this.    Patient Condition: I have reviewed [facility] records from [facility], spoken with  [facility] team , and patient and son. I met with patient at the bedside for inpatient rehabilitation assessment.  Patient will benefit from ongoing PT, OT,  and SLP, can actively participate in 3 hours of therapy a day 5 days of the week, and can make measurable gains during the admission.  Patient will also benefit from the coordinated team approach during an Inpatient Acute Rehabilitation admission.  The patient will receive intensive therapy as well as Rehabilitation physician, nursing, social worker, and care management interventions.  Due to bladder management, bowel management, safety, skin/wound care, disease management, medication administration, pain management, and patient education the patient requires 24 hour a day rehabilitation nursing.  The patient is currently mod assist +2 with mobility and basic ADLs.  Discharge setting and therapy post discharge at home with home health is anticipated.  Patient has agreed to participate in the Acute Inpatient Rehabilitation Program and will admit today.   Preadmission Screen Completed By:  Michel Santee, PT, DPT  04/20/2022 12:58 PM ______________________________________________________________________   Discussed status with Dr. Dagoberto Ligas on 04/20/22  at 12:58 PM  and received approval for admission today.   Admission Coordinator:  Michel Santee, PT, DPT time 12:58 PM Sudie Grumbling 04/20/22     Assessment/Plan: Diagnosis: Does the need for close, 24 hr/day Medical supervision in concert with the patient's rehab needs make it unreasonable for this patient to be served in a less intensive setting? Yes Co-Morbidities requiring  supervision/potential complications: Afib, L hemiparesis due to R M1 occlusion s/p thrombectomy with R brain mutiple strokes; urinary retention; GBE0F; depression/anxiety Due to bladder management, bowel management, safety, skin/wound care, disease management, medication administration, and patient education, does the patient require 24 hr/day rehab nursing? Yes Does the patient require coordinated care of a physician, rehab nurse, PT, OT, and SLP to address physical and functional deficits  in the context of the above medical diagnosis(es)? Yes Addressing deficits in the following areas: balance, endurance, locomotion, strength, transferring, bowel/bladder control, bathing, dressing, feeding, grooming, toileting, cognition, and swallowing Can the patient actively participate in an intensive therapy program of at least 3 hrs of therapy 5 days a week? Yes The potential for patient to make measurable gains while on inpatient rehab is good Anticipated functional outcomes upon discharge from inpatient rehab: supervision and min assist PT, supervision and min assist OT, supervision SLP Estimated rehab length of stay to reach the above functional goals is: 12-14 days Anticipated discharge destination: Home 10. Overall Rehab/Functional Prognosis: good

## 2022-04-20 NOTE — Progress Notes (Signed)
Physical Therapy Treatment Patient Details Name: [name] MRN: [id number] DOB: 09-26-1925 Today's Date: 04/20/2022   History of Present Illness Pt is a 89yo female who presented to ED with L sided weakness, L neglect, R gaze deviations, dysarthria, and chest pain. Imaging revealed  R M1 occlusionTNK given and s/p thrombectomy of R M1  PMH: afib not on anticoagulation, aortic stenosis status post TAVR, CAD, CKD stage III, depression, breast cancer, hyperlipidemia, hypertension, hypothyroidism, anxiety, and arthritis    PT Comments    Pt making steady progress towards her physical therapy goals and remains motivated to participate. Session focused on transitions and progressive ambulation. Pt requiring moderate assist for transfers; ambulating 20 ft x 2 with a walker and close chair follow. Demonstrates improving L sided strength. Continue to recommend acute inpatient rehab (AIR) for post-acute therapy needs.    Recommendations for follow up therapy are one component of a multi-disciplinary discharge planning process, led by the attending physician.  Recommendations may be updated based on patient status, additional functional criteria and insurance authorization.  Follow Up Recommendations  Acute inpatient rehab (3hours/day)     Assistance Recommended at Discharge Frequent or constant Supervision/Assistance  Patient can return home with the following A lot of help with walking and/or transfers;A lot of help with bathing/dressing/bathroom;Direct supervision/assist for medications management;Direct supervision/assist for financial management;Assist for transportation;Help with stairs or ramp for entrance   Equipment Recommendations  None recommended by PT    Recommendations for Other Services       Precautions / Restrictions Precautions Precautions: Fall Restrictions Weight Bearing Restrictions: No     Mobility  Bed Mobility               General bed mobility  comments: OOB in chair    Transfers Overall transfer level: Needs assistance Equipment used: Rolling walker (2 wheels) Transfers: Sit to/from Stand, Bed to chair/wheelchair/BSC Sit to Stand: Mod assist           General transfer comment: ModA to boost from chair x 2    Ambulation/Gait Ambulation/Gait assistance: Min assist, +2 safety/equipment Gait Distance (Feet): 40 Feet (20", 20") Assistive device: Rolling walker (2 wheels) Gait Pattern/deviations: Decreased stride length, Step-to pattern, Trunk flexed Gait velocity: decreased Gait velocity interpretation: <1.8 ft/sec, indicate of risk for recurrent falls   General Gait Details: Min cues for stepping initiation, sequencing, L quad activation. MinA for balance and chair follow utilized. 1 rest break in between bouts   Stairs             Wheelchair Mobility    Modified Rankin (Stroke Patients Only) Modified Rankin (Stroke Patients Only) Pre-Morbid Rankin Score: Moderate disability Modified Rankin: Moderately severe disability     Balance Overall balance assessment: Needs assistance Sitting-balance support: Feet supported, No upper extremity supported Sitting balance-Leahy Scale: Fair     Standing balance support: Bilateral upper extremity supported Standing balance-Leahy Scale: Poor Standing balance comment: reliant on RW                            Cognition Arousal/Alertness: Awake/alert Behavior During Therapy: WFL for tasks assessed/performed Overall Cognitive Status: Impaired/Different from baseline Area of Impairment: Following commands, Safety/judgement, Problem solving, Memory                     Memory: Decreased recall of precautions, Decreased short-term memory Following Commands: Follows one step commands with increased time Safety/Judgement: Decreased awareness of safety,  Decreased awareness of deficits   Problem Solving: Slow processing, Decreased initiation,  Difficulty sequencing, Requires verbal cues, Requires tactile cues General Comments: Impacted by [facility]        Exercises General Exercises - Lower Extremity Ankle Circles/Pumps: Both, 10 reps, Seated Long Arc Quad: Both, 10 reps, Seated    General Comments        Pertinent Vitals/Pain Pain Assessment Pain Assessment: No/denies pain    Home Living                          Prior Function            PT Goals (current goals can now be found in the care plan section) Acute Rehab PT Goals Patient Stated Goal: home Potential to Achieve Goals: Good Progress towards PT goals: Progressing toward goals    Frequency    Min 3X/week      PT Plan Current plan remains appropriate    Co-evaluation              AM-PAC PT "6 Clicks" Mobility   Outcome Measure  Help needed turning from your back to your side while in a flat bed without using bedrails?: A Little Help needed moving from lying on your back to sitting on the side of a flat bed without using bedrails?: A Little Help needed moving to and from a bed to a chair (including a wheelchair)?: A Little Help needed standing up from a chair using your arms (e.g., wheelchair or bedside chair)?: A Lot Help needed to walk in hospital room?: A Little Help needed climbing 3-5 steps with a railing? : Total 6 Click Score: 15    End of Session Equipment Utilized During Treatment: Gait belt Activity Tolerance: Patient tolerated treatment well Patient left: in chair;with call bell/phone within reach;with chair alarm set;with family/visitor present Nurse Communication: Mobility status PT Visit Diagnosis: Unsteadiness on feet (R26.81);Muscle weakness (generalized) (M62.81);Difficulty in walking, not elsewhere classified (R26.2)     Time: 1761-6073 PT Time Calculation (min) (ACUTE ONLY): 21 min  Charges:  $Gait Training: 8-22 mins                     Wyona Almas, PT, DPT Acute Rehabilitation Services Office  [place] 04/20/2022, 12:42 PM

## 2022-04-20 NOTE — Progress Notes (Signed)
Speech Language Pathology Treatment: Dysphagia;Cognitive-Linquistic  Patient Details Name: [name] MRN: [id number] DOB: [birth year] Today's Date: 04/20/2022 Time: 6389-3734 SLP Time Calculation (min) (ACUTE ONLY): 13 min  Assessment / Plan / Recommendation Clinical Impression  Pt seen for dysphagia and cognition with son present. She was able to orally masticate and transit solid texture without impairments and pharyngeal phase was coordinated without signs of aspiration. Needed min reminders not to vocalize while masticating. Son and pt did not report difficulty with po's since evaluation. Recommend continue regular/thin. No further ST needed for swallow.  Pt stated her memory may be poor "because sometimes I can't hear." Discussed if she needs to have written information to compensate this may be beneficial. She needed moderate cues to recall her mornings activities. She was not oriented to place and thought she was in a hotel and cued to look at environmental signs in the room to assist with orientation. Pt is being admitted to inpatient rehab until and recommend she continue cognitive therapy there.    HPI HPI: Pt is a 89 y.o. female who presented secondary to chest pain, dysarthria, left-sided weakness, right gaze deviation and left-sided neglect. TNK given. 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 Pt s/p mechanical thrombectomy with successful recanalization (TICI 3) 2/3; pt left intubated post procedure, but extubated later that day. Pt failed the [facility] twice since she stopped drinking. PMH: atrial fibrillation not on anticoagulation, aortic stenosis status post TAVR, CAD, CKD stage III, depression, breast cancer, GERD, hyperlipidemia, hypertension, hypothyroidism and arthritis.      SLP Plan   (met swallow goals, continue for cognition)      Recommendations for follow up  therapy are one component of a multi-disciplinary discharge planning process, led by the attending physician.  Recommendations may be updated based on patient status, additional functional criteria and insurance authorization.    Recommendations  Diet recommendations: Regular;Thin liquid Liquids provided via: Cup;Straw Medication Administration: Whole meds with liquid Supervision: Patient able to self feed Compensations: Slow rate;Small sips/bites Postural Changes and/or Swallow Maneuvers: Seated upright 90 degrees                General recommendations: Rehab consult Oral Care Recommendations: Oral care BID Follow Up Recommendations: Acute inpatient rehab (3hours/day) Assistance recommended at discharge: Frequent or constant Supervision/Assistance SLP Visit Diagnosis: Dysphagia, unspecified (R13.10);Cognitive communication deficit (R41.841) Plan:  (met swallow goals, continue for cognition)           Houston Siren  04/20/2022, 2:39 PM

## 2022-04-20 NOTE — Plan of Care (Signed)
  Problem: Education: Goal: Knowledge of disease or condition will improve Outcome: Progressing Goal: Knowledge of secondary prevention will improve (MUST DOCUMENT ALL) Outcome: Progressing Goal: Knowledge of patient specific risk factors will improve [name] N/A or DELETE if not current risk factor) Outcome: Progressing   Problem: Ischemic Stroke/TIA Tissue Perfusion: Goal: Complications of ischemic stroke/TIA will be minimized Outcome: Progressing   Problem: Coping: Goal: Will verbalize positive feelings about self Outcome: Progressing Goal: Will identify appropriate support needs Outcome: Progressing   Problem: Health Behavior/Discharge Planning: Goal: Ability to manage health-related needs will improve Outcome: Progressing Goal: Goals will be collaboratively established with patient/family Outcome: Progressing   Problem: Self-Care: Goal: Ability to participate in self-care as condition permits will improve Outcome: Progressing Goal: Verbalization of feelings and concerns over difficulty with self-care will improve Outcome: Progressing Goal: Ability to communicate needs accurately will improve Outcome: Progressing   Problem: Nutrition: Goal: Risk of aspiration will decrease Outcome: Progressing Goal: Dietary intake will improve Outcome: Progressing   Problem: Education: Goal: Understanding of CV disease, CV risk reduction, and recovery process will improve Outcome: Progressing Goal: Individualized Educational Video(s) Outcome: Progressing   Problem: Activity: Goal: Ability to return to baseline activity level will improve Outcome: Progressing   Problem: Cardiovascular: Goal: Ability to achieve and maintain adequate cardiovascular perfusion will improve Outcome: Progressing Goal: Vascular access site(s) Level 0-1 will be maintained Outcome: Progressing   Problem: Health Behavior/Discharge Planning: Goal: Ability to safely manage health-related needs after  discharge will improve Outcome: Progressing   Problem: Activity: Goal: Ability to tolerate increased activity will improve Outcome: Progressing   Problem: Respiratory: Goal: Ability to maintain a clear airway and adequate ventilation will improve Outcome: Progressing   Problem: Role Relationship: Goal: Method of communication will improve Outcome: Progressing   Problem: Education: Goal: Knowledge of General Education information will improve Description: Including pain rating scale, medication(s)/side effects and non-pharmacologic comfort measures Outcome: Progressing   Problem: Health Behavior/Discharge Planning: Goal: Ability to manage health-related needs will improve Outcome: Progressing   Problem: Clinical Measurements: Goal: Ability to maintain clinical measurements within normal limits will improve Outcome: Progressing Goal: Will remain free from infection Outcome: Progressing Goal: Diagnostic test results will improve Outcome: Progressing Goal: Respiratory complications will improve Outcome: Progressing Goal: Cardiovascular complication will be avoided Outcome: Progressing   Problem: Activity: Goal: Risk for activity intolerance will decrease Outcome: Progressing   Problem: Nutrition: Goal: Adequate nutrition will be maintained Outcome: Progressing   Problem: Coping: Goal: Level of anxiety will decrease Outcome: Progressing   Problem: Elimination: Goal: Will not experience complications related to bowel motility Outcome: Progressing Goal: Will not experience complications related to urinary retention Outcome: Progressing   Problem: Pain Managment: Goal: General experience of comfort will improve Outcome: Progressing   Problem: Safety: Goal: Ability to remain free from injury will improve Outcome: Progressing   Problem: Skin Integrity: Goal: Risk for impaired skin integrity will decrease Outcome: Progressing

## 2022-04-20 NOTE — Progress Notes (Signed)
INPATIENT REHABILITATION ADMISSION NOTE   Arrival Method:recliner     Mental Orientation:alert   Assessment: done  Skin:done   IV'S:done   Pain:done   Tubes and Drains:none   Safety Measures:done   Vital Signs:done   Height and Weight:done   Rehab Orientation: done  Family:daughter and son came with patient [name] RNC,BSN, WTA

## 2022-04-20 NOTE — TOC Transition Note (Signed)
Transition of Care [facility]) - CM/SW Discharge Note   Patient Details  Name: [name] MRN: [id number] Date of Birth: [birth year]  [facility] CM/SW Contact:  Pollie Friar, RN Phone Number: 04/20/2022, 12:58 PM   Clinical Narrative:    Pt is discharging to CIR today. CM signing off.   Final next level of care: IP Rehab Facility Barriers to Discharge: No Barriers Identified   Patient Goals and CMS Choice CMS Medicare.gov Compare Post Acute Care list provided to:: Patient Choice offered to / list presented to : Patient  Discharge Placement                         Discharge Plan and Services Additional resources added to the After Visit Summary for                                       Social Determinants of Health (SDOH) Interventions SDOH Screenings   Tobacco Use: Medium Risk (04/20/2022)     Readmission Risk Interventions     No data to display

## 2022-04-20 NOTE — H&P (Signed)
Physical Medicine and Rehabilitation Admission H&P    Chief Complaint  Patient presents with   Stroke with functional deficits    HPI: [name] is a 89 year old female with history of CAD s/p DES to RCA '10, AS s/p TVAR '19, HTN, AF (declined AC), CKD III, mild memory loss, recurrent breast cancer, depression/anxiety, hx of candida esophagitis, Covid 11/23;  who was admitted on 04/16/22 with left sided weakness, difficulty speaking and right gaze preference.  CTA head/neck showed R-MCA M1 occlusion with incdental multiple thyroid nodules. TNK administered and she underwent cerebral angio with thrombectomy. Post procedure hematoma treated with manual pressure and QuickClot and was on cleviprex for BP control. MRI brain done revealing acute infarcts in right temporal lobe, body of corpus callosum, right corona radiata and left centrum ovale with scattered areas of susceptibility question microhemorrhage or small volume pneumocephalus. She was noted to be Covid + but asymptomatic. She tolerated extubation and stroke felt to be embolic due to A fib. 2D showed EF 40-45% with global hypokinesis, moderate bilateral RA dilatation, small pericardial effusion and severe concentric LVH. She was weaned off cleviprex and Eliquis added today.   She has had issues with insomnia as well as SOB/DOE. ST evaluation showed deficits in memory, comprehension and attention. She required NRB with therapy yesterday but reported to be better today. She continues to be limited by right foot pain, left sided weakness, cognitive deficits requiring increased time for simple commands and difficulty with sequencing. CIR recommended due to functional deficits.    Pt reports R foot pain since off Voltaren pills- doesn't think Diclofenac gel helps; but likes aspercreme with 4% Lidocaine.  Some difficulty voiding today LBM this AM.   Had 24/7 caregivers since ~ 01/13/22.  Had [place] in 12/23.    Review of Systems   Constitutional:  Negative for chills and fever.  HENT:  Positive for hearing loss.   Eyes:  Negative for blurred vision.  Respiratory:  Negative for cough.   Cardiovascular:  Negative for chest pain and palpitations.  Gastrointestinal:  Positive for diarrhea. Negative for heartburn.  Genitourinary:        Difficulty peeing some today  Musculoskeletal:  Negative for myalgias.  Neurological:  Positive for focal weakness. Negative for dizziness, speech change and headaches.  Psychiatric/Behavioral:  Positive for memory loss. The patient has insomnia.   All other systems reviewed and are negative.   Past Medical History:  Diagnosis Date   Abnormal gait 06/24/2015   Last Assessment & Plan:   Relevant Hx:  Course:  Daily Update:  Today's Plan:uses her cane for this and no recent falls     Electronically signed by: Mayer Camel, NP  07/23/15 1529   Acute cough 08/25/2011   Acute kidney injury superimposed on chronic kidney disease ([place]) 10/18/2020   Anemia due to stage 3 chronic kidney disease ([place]) 06/24/2015   Last Assessment & Plan: Formatting of this note might be different from the original. Relevant Hx: Course: Daily Update: Today's Plan:update her CMP for her Electronically signed by: Mayer Camel, NP 07/23/15 1528 Formatting of this note might be different from the original. Last Assessment & Plan: Relevant Hx: Course: Daily Update: Today's Plan:update her CMP for her Electronically    Anxiety 06/24/2015   Last Assessment & Plan:   Relevant Hx:  Course:  Daily Update:  Today's Plan:this is stable for her at this time     Electronically signed by: Ruthell Rummage  Brown-Patram, NP  07/23/15 1529   Aortic stenosis, severe    a. 07/2017: s/p TAVR w/ an Edwards Sapien 3 THV (size 26 mm, model # [id number], serial # [id number])   Atherosclerosis of coronary artery 06/24/2015   Formatting of this note might be different from the original. Ostial RCA DES, 2010 Last  Assessment & Plan: stable Last Assessment & Plan: Relevant Hx: Course: Daily Update: Today's Plan:she has felt she was stable from this and she has cardiologist she has followed with . Electronically signed by: Mayer Camel, NP 07/23/15 1523 Ostial RCA DES, 2010 Last Assessment & Plan: stable   Atrial fibrillation ([place]) 03/19/2022   Bilateral hearing loss 06/25/2015   Bilateral sensorineural hearing loss 12/26/2020   Breast cancer, right breast ([place]) 03/24/2008   CAD (coronary artery disease)    a. 2010: s/p stent to RCA   Chronic diastolic (congestive) heart failure ([place]) 08/04/2017   Chronic kidney disease, stage III (moderate) (HCC)    Chronic recurrent major depressive disorder ([place]) 06/24/2015   Last Assessment & Plan:   Relevant Hx:  Course:  Daily Update:  Today's Plan:this is stable for her     Electronically signed by: Mayer Camel, NP  07/23/15 1530   Chronic sinusitis    Coronary artery disease involving native coronary artery of native heart with angina pectoris ([place]) 06/24/2015   Formatting of this note might be different from the original. Overview: Ostial RCA DES, 2010 Last Assessment & Plan: stable Last Assessment & Plan: Formatting of this note might be different from the original. Relevant Hx: Course: Daily Update: Today's Plan:she has felt she was stable from this and she has cardiologist she has followed with . Electronically signed by: Mayer Camel, N   Coronary artery disease involving native coronary artery of native heart without angina pectoris    Ostial RCA DES, 2010   Decreased hearing of both ears 06/25/2015   Deficiency anemia 03/11/2021   Dehydration 10/19/2020   Depressive disorder    Dizziness 08/14/2018   [place] of macula of both eyes 09/12/2018   Ductal carcinoma in situ of breast 06/23/2007   Encounter for long-term current use of high risk medication 06/24/2015   Epiretinal membrane (ERM) of left eye 09/12/2018    Essential hypertension, benign 02/16/2013   Gastro-esophageal reflux disease with esophagitis 06/24/2015   Last Assessment & Plan:   Relevant Hx:  Course:  Daily Update:  Today's Plan:she is stable from this      Electronically signed by: Mayer Camel, NP  07/23/15 1526   Gastroesophageal reflux disease without esophagitis 12/27/2016   History of breast cancer    a. s/p lumpectomies and XRT   Hyperlipemia    Hyperlipidemia 02/16/2013   Hypertension    Hypothyroid    Hypothyroidism, acquired 06/24/2015   Last Assessment & Plan:   Relevant Hx:  Course:  Daily Update:  Today's Plan:update her TSH for her today     Electronically signed by: Mayer Camel, NP  07/23/15 1526   Incisional hernia, without obstruction or gangrene 02/24/2019   Insomnia    Intermediate stage nonexudative age-related macular degeneration of both eyes 01/19/2021   Irritation of oral cavity 11/23/2019   Malaise and fatigue 06/24/2015   Last Assessment & Plan:   Relevant Hx:  Course:  Daily Update:  Today's Plan:she is up and down with her energy and she feels that for the most part she has been stable     Electronically signed  by: Mayer Camel, NP  07/23/15 1533   Malignant neoplasm of central portion of left female breast ([place]) 06/24/2015   Last Assessment & Plan:   Relevant Hx:  Course:  Daily Update:  Today's Plan:she is now receiving injections once a month for this and is hopeful will keep this at bay as this has been recurrent     Electronically signed by: Mayer Camel, NP  07/23/15 1530   Melena 03/19/2022   Memory change 07/23/2015   Last Assessment & Plan:   Relevant Hx:  Course:  Daily Update:  Today's Plan:she is going to take the aricept at supper     Electronically signed by: Mayer Camel, NP  07/23/15 1532   Memory loss    Multiple thyroid nodules 02/09/2012   Osteoarthritis    Osteoporosis 03/24/2020   Other allergic rhinitis 12/27/2016    Plantar fat pad atrophy 01/19/2016   Posterior vitreous detachment of left eye 09/12/2018   Primary insomnia 06/24/2015   Last Assessment & Plan:   Relevant Hx:  Course:  Daily Update:  Today's Plan:she feels that this is stable for her at this time     Electronically signed by: Mayer Camel, NP  07/23/15 1531   Primary localized osteoarthrosis, ankle and foot 01/15/2016   Primary osteoarthritis involving multiple joints 06/24/2015   Last Assessment & Plan:   Relevant Hx:  Course:  Daily Update:  Today's Plan:she is stable from her joint though she has discomfort if she overexerts.     Electronically signed by: Mayer Camel, NP  07/23/15 1528   Reflux esophagitis    S/P TAVR (transcatheter aortic valve replacement) 08/02/2017   Severe aortic stenosis 02/16/2013   Physician Review  Conclusions: 1. Mild concentric left ventricular hypertrophy.  2. Left ventricular ejection fraction estimated by 2D at 60-65 percent.  3. There were no regional wall motion abnormalities.  4. Mild mitral annular calcification.  5. Trace mitral valve regurgitation.  6. Trivial tricuspid regurgitation.  7. Moderate increased thickness and calcification of the trileaflet aortic val   Stage III chronic kidney disease ([place]) 06/24/2015   Last Assessment & Plan:   Relevant Hx:  Course:  Daily Update:  Today's Plan:update her CMP for her     Electronically signed by: Mayer Camel, NP  07/23/15 1528   Thyroid nodule 06/24/2015   Vitreous membranes and strands 01/19/2021   PPV on 02/11/2021 OS    Past Surgical History:  Procedure Laterality Date   ANGIOPLASTY     BIOPSY  10/21/2020   Procedure: BIOPSY;  Surgeon: Carol Ada, MD;  Location: [facility];  Service: Endoscopy;;   BREAST LUMPECTOMY     x2   ESOPHAGOGASTRODUODENOSCOPY Left 10/21/2020   Procedure: ESOPHAGOGASTRODUODENOSCOPY (EGD);  Surgeon: Carol Ada, MD;  Location: [facility];  Service: Endoscopy;  Laterality:  Left;  Abnormal barium swallow   FOOT TENDON SURGERY     GALLBLADDER SURGERY     INCISIONAL HERNIA REPAIR N/A 03/01/2019   Procedure: OPEN INCISIONAL HERNIA REPAIR;  Surgeon: Armandina Gemma, MD;  Location: [facility];  Service: General;  Laterality: N/A;   INSERTION OF MESH N/A 03/01/2019   Procedure: INSERTION OF MESH;  Surgeon: Armandina Gemma, MD;  Location: [facility];  Service: General;  Laterality: N/A;   INTRAOPERATIVE TRANSTHORACIC ECHOCARDIOGRAM N/A 08/02/2017   Procedure: INTRAOPERATIVE TRANSTHORACIC ECHOCARDIOGRAM;  Surgeon: Burnell Blanks, MD;  Location: [facility];  Service: Open Heart Surgery;  Laterality: N/A;   IR CT HEAD  LTD  04/16/2022   IR PERCUTANEOUS ART THROMBECTOMY/INFUSION INTRACRANIAL INC DIAG ANGIO  04/16/2022   RADIOLOGY WITH ANESTHESIA N/A 04/16/2022   Procedure: IR WITH ANESTHESIA;  Surgeon: Luanne Bras, MD;  Location: [facility];  Service: Radiology;  Laterality: N/A;   REPLACEMENT TOTAL KNEE BILATERAL     RIGHT/LEFT HEART CATH AND CORONARY ANGIOGRAPHY N/A 07/05/2017   Procedure: RIGHT/LEFT HEART CATH AND CORONARY ANGIOGRAPHY;  Surgeon: Belva Crome, MD;  Location: [facility];  Service: Cardiovascular;  Laterality: N/A;   SKIN CANCER EXCISION  2018   right nostril    TOTAL ABDOMINAL HYSTERECTOMY     TRANSCATHETER AORTIC VALVE REPLACEMENT, TRANSFEMORAL N/A 08/02/2017   Procedure: TRANSCATHETER AORTIC VALVE REPLACEMENT, TRANSFEMORAL;  Surgeon: Burnell Blanks, MD;  Location: [facility];  Service: Open Heart Surgery;  Laterality: N/A;    Family History  Problem Relation Age of Onset   Heart disease Father    Stroke Mother    Heart disease Maternal Grandfather    Breast cancer Sister     Social History: Lives in ALF with 24/7 caregivers for assitance with ADL and non ambulatory x for few steps.  Per  reports that she quit smoking about 58 years ago. Her smoking use included cigarettes- 1/2 PPD She has a 10.00 pack-year smoking history. She has never used  smokeless tobacco. She reports current alcohol use--mixed drink or glass of wine . She reports that she does not use drugs.   Allergies  Allergen Reactions   Latex Hives and Swelling    Other reaction(s): Unknown   Lisinopril Cough    Other reaction(s): Unknown   Povidone Iodine Other (See Comments)    Blisters  Other reaction(s): Unknown   Povidone-Iodine     Other Reaction(s): Unknown    Medications Prior to Admission  Medication Sig Dispense Refill   albuterol (VENTOLIN HFA) 108 (90 Base) MCG/ACT inhaler Inhale 2 puffs into the lungs every 4 (four) hours as needed for wheezing or shortness of breath. 1 each 12   amLODipine (NORVASC) 2.5 MG tablet Take 2.5 mg by mouth 2 (two) times daily.     buPROPion (WELLBUTRIN XL) 300 MG 24 hr tablet Take 300 mg by mouth every morning.     busPIRone (BUSPAR) 5 MG tablet Take 1 tablet (5 mg total) by mouth 3 (three) times daily as needed (anxiety). 30 tablet 0   cloNIDine (CATAPRES) 0.1 MG tablet Take 0.1 mg by mouth 2 (two) times daily.     diclofenac (VOLTAREN) 25 MG EC tablet Take 1 tablet (25 mg total) by mouth 2 (two) times daily as needed for moderate pain. 60 tablet 0   diphenoxylate-atropine (LOMOTIL) 2.5-0.025 MG tablet Take 1 tablet by mouth 4 (four) times daily as needed for diarrhea or loose stools. 30 tablet 0   Eszopiclone 3 MG TABS Take 3 mg by mouth at bedtime.     FEROSUL 325 (65 Fe) MG tablet Take 325 mg by mouth 2 (two) times daily.     fluticasone (FLONASE) 50 MCG/ACT nasal spray Place 1 spray into both nostrils in the morning and at bedtime. 15.8 mL 0   furosemide (LASIX) 20 MG tablet Take 1 tablet (20 mg total) by mouth 2 (two) times daily. 60 tablet 12   Magnesium Oxide 500 MG CAPS Take 500 mg by mouth daily.     metoprolol succinate (TOPROL-XL) 25 MG 24 hr tablet Take 25 mg by mouth 2 (two) times daily.     Probiotic Product (  ALIGN PO) Take 1 capsule by mouth daily.     doxycycline (MONODOX) 100 MG capsule Take 1 capsule  (100 mg total) by mouth 2 (two) times daily. (Patient not taking: Reported on 04/17/2022) 20 capsule 0   levothyroxine (SYNTHROID, LEVOTHROID) 50 MCG tablet Take 50 mcg by mouth daily before breakfast.  (Patient not taking: Reported on 04/17/2022)      Home: Home Living Family/patient expects to be discharged to:: Private residence Living Arrangements: Alone Available Help at Discharge: Personal care attendant, Available 24 hours/day Type of Home: Independent living facility Home Access: Level entry [place]: One level Bathroom Shower/Tub: Gaffer, Architectural technologist: Handicapped height Home Equipment: Rollator (4 wheels)   Functional History: Prior Function Prior Level of Function : Needs assist Physical Assist : Mobility (physical), ADLs (physical) Mobility Comments: Since Dec could get up from bed by herself and get to seat on rollator with min guard A but was not walking ADLs Comments: supervision from hired caregivers  Functional Status:  Mobility: Bed Mobility Overal bed mobility: Needs Assistance Bed Mobility: Supine to Sit Rolling: Min assist Sidelying to sit: Mod assist, +2 for physical assistance, HOB elevated General bed mobility comments: good initiation by pt, minA with bed pad to complete scoot. hand over hand guidance for use of bed rail Transfers Overall transfer level: Needs assistance Equipment used: Rolling walker (2 wheels) Transfers: Sit to/from Stand, Bed to chair/wheelchair/BSC Sit to Stand: Mod assist, +2 physical assistance Bed to/from chair/wheelchair/BSC transfer type:: Step pivot Step pivot transfers: Mod assist, +2 physical assistance General transfer comment: Pt pulling up on walker with PT stabilizing, bilateral anterior foot block provided. Pt with increased trunk/hip flexion. Step by step cues for sequencing/direction. Improved L foot clearance Ambulation/Gait General Gait Details: unable this date    ADL: ADL Overall ADL's :  Needs assistance/impaired Eating/Feeding: Set up, Supervision/ safety, Sitting Eating/Feeding Details (indicate cue type and reason): She did ask her son to feed her after she had fed herself ~1/2 her omelet Grooming: Set up, Supervision/safety, Sitting Upper Body Bathing: Set up, Supervision/ safety, Sitting Lower Body Bathing: Total assistance Lower Body Bathing Details (indicate cue type and reason): Mod A +2 sit<>stand Upper Body Dressing : Moderate assistance, Sitting Lower Body Dressing: Total assistance Lower Body Dressing Details (indicate cue type and reason): Mod A +2 sit<>stand Toilet Transfer: Moderate assistance, +2 for physical assistance, Stand-pivot, Rolling walker (2 wheels) Toilet Transfer Details (indicate cue type and reason): simulated bed>recliner next to bed on right Toileting- Clothing Manipulation and Hygiene: Total assistance Toileting - Clothing Manipulation Details (indicate cue type and reason): Mod A +2 sit<>stand General ADL Comments: Educated son and dtr in law in room about the options for AIR, SNF, HH  Cognition: Cognition Overall Cognitive Status: Impaired/Different from baseline Arousal/Alertness: Awake/alert Orientation Level: Oriented to person, Oriented to place, Oriented to situation, Disoriented to time Year:  [id number]) Month: February Day of Week: Incorrect Attention: Focused, Sustained Focused Attention: Appears intact Sustained Attention: Impaired Sustained Attention Impairment: Verbal basic Memory: Impaired Memory Impairment: Storage deficit, Decreased short term memory Decreased Short Term Memory: Verbal basic Awareness: Impaired Awareness Impairment: Emergent impairment Problem Solving: Impaired Problem Solving Impairment: Verbal complex Executive Function: Reasoning Reasoning: Impaired Cognition Arousal/Alertness: Awake/alert Behavior During Therapy: WFL for tasks assessed/performed Overall Cognitive Status: Impaired/Different from  baseline Area of Impairment: Following commands, Safety/judgement, Problem solving, Memory Memory: Decreased recall of precautions, Decreased short-term memory Following Commands: Follows one step commands with increased time Safety/Judgement: Decreased awareness of safety,  Decreased awareness of deficits Problem Solving: Slow processing, Decreased initiation, Difficulty sequencing, Requires verbal cues, Requires tactile cues General Comments: Impacted by [facility]   Blood pressure 121/72, pulse (!) 103, temperature 98 F (36.7 C), temperature source Oral, resp. rate 16, height '4\' 11"'$  (1.499 m), weight 64.6 kg, SpO2 99 %. Physical Exam Vitals and nursing note reviewed. Exam conducted with a chaperone present.  Constitutional:      Appearance: Normal appearance.     Comments: Increase WOB with minimal conversation. HOH Frail appearing; very focused on eating her meal; daughter, son and son in law at bedside; alert, awake, NAD  HENT:     Head: Normocephalic and atraumatic.     Comments: L facial droop Tongue midline    Right Ear: External ear normal.     Left Ear: External ear normal.     Nose: Nose normal. No congestion.     Mouth/Throat:     Mouth: Mucous membranes are moist.     Pharynx: Oropharynx is clear. No oropharyngeal exudate.  Eyes:     General:        Right eye: No discharge.        Left eye: No discharge.     Extraocular Movements: Extraocular movements intact.  Cardiovascular:     Rate and Rhythm: Normal rate. Rhythm irregular.     Heart sounds: Murmur heard.     No gallop.  Pulmonary:     Effort: Pulmonary effort is normal. No respiratory distress.     Breath sounds: Normal breath sounds. No wheezing, rhonchi or rales.     Comments: Increased WOB at times with conversation.  Abdominal:     General: Bowel sounds are normal. There is no distension.     Palpations: Abdomen is soft.     Tenderness: There is no abdominal tenderness.  Genitourinary:    Comments: Purewick  in place, but no urine in cannister Musculoskeletal:     Cervical back: Neck supple. No tenderness.     Comments: RUE 5-/5 LUE Biceps, triceps, WE,  grip and FA 4/5- but difficult to get her to comply with exam RLE HF 2/5; DF/PF 4+/5 LLE- HF 2/5; couldn't do knee testing B/L; DF/PF 4-/5   Skin:    General: Skin is warm and dry.     Comments: Well healed long scar right foot medially. Diffuse ecchymosis right hand.  R groin has dressing from thrombectomy- no swelling other than mild; but has sanguinous drainage on dressing- ~25% saturated dressing 2+ LE edema to mid calf B/L   Neurological:     Mental Status: She is alert.     Comments: Was oriented to self, place, age, DOB, month, year. Situation "fall". Unable to recall medical history and started getting worse as conversation progressed. She was able to follow simple motor commands. Mild left facial weakness with soft voice and mild dysarthria. Left sided weakness noted. Decreased ROM right foot. Decreased to sensation on left side Also has moderate neglect on L side- impaired exam  Psychiatric:     Comments: Flat; not oriented     Results for orders placed or performed during the hospital encounter of 04/16/22 (from the past 48 hour(s))  CBC with Differential/Platelet     Status: Abnormal   Collection Time: 04/19/22  3:25 AM  Result Value Ref Range   WBC 6.4 4.0 - 10.5 K/uL   RBC 3.07 (L) 3.87 - 5.11 MIL/uL   Hemoglobin 8.5 (L) 12.0 - 15.0 g/dL   HCT  26.5 (L) 36.0 - 46.0 %   MCV 86.3 80.0 - 100.0 fL   MCH 27.7 26.0 - 34.0 pg   MCHC 32.1 30.0 - 36.0 g/dL   RDW 22.2 (H) 11.5 - 15.5 %   Platelets 194 150 - 400 K/uL   nRBC 0.0 0.0 - 0.2 %   Neutrophils Relative % 67 %   Neutro Abs 4.4 1.7 - 7.7 K/uL   Lymphocytes Relative 20 %   Lymphs Abs 1.3 0.7 - 4.0 K/uL   Monocytes Relative 10 %   Monocytes Absolute 0.6 0.1 - 1.0 K/uL   Eosinophils Relative 1 %   Eosinophils Absolute 0.1 0.0 - 0.5 K/uL   Basophils Relative 1 %    Basophils Absolute 0.0 0.0 - 0.1 K/uL   Immature Granulocytes 1 %   Abs Immature Granulocytes 0.04 0.00 - 0.07 K/uL   Tear Drop Cells PRESENT    Ovalocytes PRESENT     Comment: Performed at [facility] [address], [place], [place] [address]  Basic metabolic panel     Status: Abnormal   Collection Time: 04/19/22  3:25 AM  Result Value Ref Range   Sodium 137 135 - 145 mmol/L   Potassium 3.4 (L) 3.5 - 5.1 mmol/L   Chloride 107 98 - 111 mmol/L   CO2 23 22 - 32 mmol/L   Glucose, Bld 105 (H) 70 - 99 mg/dL    Comment: Glucose reference range applies only to samples taken after fasting for at least 8 hours.   BUN 14 8 - 23 mg/dL   Creatinine, Ser 0.97 0.44 - 1.00 mg/dL   Calcium 8.9 8.9 - 10.3 mg/dL   GFR, Estimated 54 (L) >60 mL/min    Comment: (NOTE) Calculated using the CKD-EPI Creatinine Equation (2021)    Anion gap 7 5 - 15    Comment: Performed at [facility] [address], [place], [place] [address]  Magnesium     Status: Abnormal   Collection Time: 04/19/22  3:25 AM  Result Value Ref Range   Magnesium 1.6 (L) 1.7 - 2.4 mg/dL    Comment: Performed at [facility] [address], [place], [place] [address]  Phosphorus     Status: None   Collection Time: 04/19/22  3:25 AM  Result Value Ref Range   Phosphorus 3.1 2.5 - 4.6 mg/dL    Comment: Performed at [facility] [address], [place], [place] [address]  CBC with Differential/Platelet     Status: Abnormal   Collection Time: 04/20/22  6:18 AM  Result Value Ref Range   WBC 9.1 4.0 - 10.5 K/uL   RBC 3.47 (L) 3.87 - 5.11 MIL/uL   Hemoglobin 9.3 (L) 12.0 - 15.0 g/dL   HCT 30.4 (L) 36.0 - 46.0 %   MCV 87.6 80.0 - 100.0 fL   MCH 26.8 26.0 - 34.0 pg   MCHC 30.6 30.0 - 36.0 g/dL   RDW 21.8 (H) 11.5 - 15.5 %   Platelets 227 150 - 400 K/uL   nRBC 0.0 0.0 - 0.2 %   Neutrophils Relative % 73 %   Neutro Abs 6.7 1.7 - 7.7 K/uL   Lymphocytes Relative 15 %   Lymphs Abs 1.4 0.7 - 4.0 K/uL    Monocytes Relative 9 %   Monocytes Absolute 0.8 0.1 - 1.0 K/uL   Eosinophils Relative 2 %   Eosinophils Absolute 0.1 0.0 - 0.5 K/uL   Basophils Relative 1 %  Basophils Absolute 0.1 0.0 - 0.1 K/uL   WBC Morphology VACUOLATED NEUTROPHILS    RBC Morphology See Note    Smear Review PLATELETS APPEAR ADEQUATE    Immature Granulocytes 0 %   Abs Immature Granulocytes 0.04 0.00 - 0.07 K/uL   Ovalocytes PRESENT     Comment: Performed at [facility], [place] [address] [place], [place] [address]  Basic metabolic panel     Status: Abnormal   Collection Time: 04/20/22  6:18 AM  Result Value Ref Range   Sodium 135 135 - 145 mmol/L   Potassium 3.4 (L) 3.5 - 5.1 mmol/L   Chloride 104 98 - 111 mmol/L   CO2 25 22 - 32 mmol/L   Glucose, Bld 107 (H) 70 - 99 mg/dL    Comment: Glucose reference range applies only to samples taken after fasting for at least 8 hours.   BUN 12 8 - 23 mg/dL   Creatinine, Ser 1.03 (H) 0.44 - 1.00 mg/dL   Calcium 8.8 (L) 8.9 - 10.3 mg/dL   GFR, Estimated 50 (L) >60 mL/min    Comment: (NOTE) Calculated using the CKD-EPI Creatinine Equation (2021)    Anion gap 6 5 - 15    Comment: Performed at [facility] [address] [place], [place] [address]  Magnesium     Status: Abnormal   Collection Time: 04/20/22  6:18 AM  Result Value Ref Range   Magnesium 1.6 (L) 1.7 - 2.4 mg/dL    Comment: Performed at [facility] [address], [place], [place] [address]  Phosphorus     Status: None   Collection Time: 04/20/22  6:18 AM  Result Value Ref Range   Phosphorus 3.4 2.5 - 4.6 mg/dL    Comment: Performed at [facility] [address], [place], [place] [address]   No results found.    Blood pressure 121/72, pulse (!) 103, temperature 98 F (36.7 C), temperature source Oral, resp. rate 16, height '4\' 11"'$  (1.499 m), weight 64.6 kg, SpO2 99 %.  Medical Problem List and Plan: 1. Functional deficits secondary to R MCA strokes with L hemiparesis and L  neglect  -patient may  shower if cover R groin  -ELOS/Goals: 12-16 days min A to supervision 2.  Antithrombotics: -DVT/anticoagulation:  Pharmaceutical: Eliquis--resumed 12/05  -antiplatelet therapy: N/A 3. Pain Management:  N/A 4. Mood/Behavior/Sleep:  LCSW to follow for evaluation and support.   -antipsychotic agents: N/A  --sleep wake chart. Will add melatonin 10 mg/hs 5. Neuropsych/cognition: This patient is not capable of making decisions on his  own behalf.  --had mild cognitive impairments at baseline. Will add delirum precautions - had 24/7 caregivers at home  6. Skin/Wound Care: Routine pressure relief measures 7. Fluids/Electrolytes/Nutrition: Monitor I/O. Check CMET in am. 8. Chronic diastolic HF/global hypokinesis: EF-45-50% with severe concentric LVH --Monitor weights daily and for signs of overload --Lasix BID, metoprolol, Lipitor  9. Covid 11/23/Hypoxia: At baseline had SOB/DOE w/activity--few steps -- Add flutter valve and encourage pulmonary hygiene.  --has been requiring NRB for activity 10. A fib: Monitor HR TID--on metoprolol and eliquis 11.  HTN: Monitor BP TID--on Metoprolol, Clonidine,  12. Acute on chronic renal failure: will check in AM 13.  Persistent hypokalemia: Supplement today-- 14. Hypomagnesemia: Mg- 1.6 --will supplement today 15. Acute on chronic anemia/Groin hematoma: Hgb 11.1--->9.3  --has had melanotic stools past month and referred to Dr. Benson Norway 16. Left breast cancer: Has declined surgery and stop Fulvestrant due  to SE.  17. H/o chronic Diarrhea: Liquid stools documented-->d/c colace and miralax.  --PTA felt to be anxiety related.  18. Chronic insomnia: Used Lunesta followed by Melatonin 10 mg every night per son.  19. Anxiety/depression: Back on Wellbutrin with buspar tid prn.  20. Urinary retention?: Will order PVR/bladder scans.- hadn't voided this AM   I have personally performed a face to face diagnostic evaluation of this patient and  formulated the key components of the plan.  Additionally, I have personally reviewed laboratory data, imaging studies, as well as relevant notes and concur with the physician assistant's documentation above.   The patient's status has not changed from the original H&P.  Any changes in documentation from the acute care chart have been noted above.     Bary Leriche, PA-C 04/20/2022

## 2022-04-20 NOTE — Progress Notes (Addendum)
Inpatient Rehab Admissions Coordinator:   Met with patient and her son, [name], at bedside.  Pt looks significantly better today, on room air, alert, and conversing appropriately.  They state she was just able to ambulate in the hallway with therapy.  We reviewed CIR goals/expectations with plan to return to ILF with 24/7 caregivers at discharge.  I may have a bed available today.  Will update once I have confirmation.   1251: I have a bed available for [name] to admit today.  Stroke service in agreement. I will let pt/family and TOC team know.   Shann Medal, PT, DPT Admissions Coordinator [phone] 04/20/22  12:22 PM

## 2022-04-21 DIAGNOSIS — I63511 Cerebral infarction due to unspecified occlusion or stenosis of right middle cerebral artery: Secondary | ICD-10-CM | POA: Diagnosis not present

## 2022-04-21 LAB — CBC WITH DIFFERENTIAL/PLATELET
Abs Immature Granulocytes: 0.03 10*3/uL (ref 0.00–0.07)
Basophils Absolute: 0 10*3/uL (ref 0.0–0.1)
Basophils Relative: 0 %
Eosinophils Absolute: 0.1 10*3/uL (ref 0.0–0.5)
Eosinophils Relative: 1 %
HCT: 30.3 % — ABNORMAL LOW (ref 36.0–46.0)
Hemoglobin: 9.9 g/dL — ABNORMAL LOW (ref 12.0–15.0)
Immature Granulocytes: 0 %
Lymphocytes Relative: 14 %
Lymphs Abs: 1.2 10*3/uL (ref 0.7–4.0)
MCH: 27.8 pg (ref 26.0–34.0)
MCHC: 32.7 g/dL (ref 30.0–36.0)
MCV: 85.1 fL (ref 80.0–100.0)
Monocytes Absolute: 0.9 10*3/uL (ref 0.1–1.0)
Monocytes Relative: 10 %
Neutro Abs: 6.3 10*3/uL (ref 1.7–7.7)
Neutrophils Relative %: 75 %
Platelets: 236 10*3/uL (ref 150–400)
RBC: 3.56 MIL/uL — ABNORMAL LOW (ref 3.87–5.11)
RDW: 21.2 % — ABNORMAL HIGH (ref 11.5–15.5)
WBC: 8.5 10*3/uL (ref 4.0–10.5)
nRBC: 0 % (ref 0.0–0.2)

## 2022-04-21 LAB — COMPREHENSIVE METABOLIC PANEL
ALT: 19 U/L (ref 0–44)
AST: 22 U/L (ref 15–41)
Albumin: 3.2 g/dL — ABNORMAL LOW (ref 3.5–5.0)
Alkaline Phosphatase: 44 U/L (ref 38–126)
Anion gap: 12 (ref 5–15)
BUN: 10 mg/dL (ref 8–23)
CO2: 23 mmol/L (ref 22–32)
Calcium: 9 mg/dL (ref 8.9–10.3)
Chloride: 102 mmol/L (ref 98–111)
Creatinine, Ser: 1 mg/dL (ref 0.44–1.00)
GFR, Estimated: 52 mL/min — ABNORMAL LOW (ref >60)
Glucose, Bld: 121 mg/dL — ABNORMAL HIGH (ref 70–99)
Potassium: 3.2 mmol/L — ABNORMAL LOW (ref 3.5–5.1)
Sodium: 137 mmol/L (ref 135–145)
Total Bilirubin: 1 mg/dL (ref 0.3–1.2)
Total Protein: 6.3 g/dL — ABNORMAL LOW (ref 6.5–8.1)

## 2022-04-21 LAB — MAGNESIUM: Magnesium: 1.5 mg/dL — ABNORMAL LOW (ref 1.7–2.4)

## 2022-04-21 LAB — PATHOLOGIST SMEAR REVIEW

## 2022-04-21 MED ORDER — FERROUS SULFATE 325 (65 FE) MG PO TABS
325.0000 mg | ORAL_TABLET | ORAL | Status: DC
  Administered 2022-04-23 – 2022-04-27 (×3): 325 mg via ORAL
  Filled 2022-04-21 (×4): qty 1

## 2022-04-21 MED ORDER — MAGNESIUM SULFATE 2 GM/50ML IV SOLN
2.0000 g | Freq: Once | INTRAVENOUS | Status: AC
  Administered 2022-04-21: 2 g via INTRAVENOUS
  Filled 2022-04-21: qty 50

## 2022-04-21 MED ORDER — POTASSIUM CHLORIDE CRYS ER 20 MEQ PO TBCR
20.0000 meq | EXTENDED_RELEASE_TABLET | Freq: Once | ORAL | Status: AC
  Administered 2022-04-21: 20 meq via ORAL
  Filled 2022-04-21: qty 1

## 2022-04-21 MED ORDER — SODIUM CHLORIDE 0.9 % IV SOLN: INTRAVENOUS | Status: DC | PRN

## 2022-04-21 MED ORDER — POTASSIUM CHLORIDE CRYS ER 20 MEQ PO TBCR
40.0000 meq | EXTENDED_RELEASE_TABLET | Freq: Two times a day (BID) | ORAL | Status: DC
  Administered 2022-04-21 – 2022-04-26 (×9): 40 meq via ORAL
  Filled 2022-04-21 (×10): qty 2

## 2022-04-21 MED ORDER — DICLOFENAC SODIUM 1 % EX GEL
4.0000 g | Freq: Four times a day (QID) | CUTANEOUS | Status: DC
  Administered 2022-04-21 – 2022-04-28 (×25): 4 g via TOPICAL
  Filled 2022-04-21: qty 100

## 2022-04-21 MED ORDER — MAGNESIUM OXIDE -MG SUPPLEMENT 400 (240 MG) MG PO TABS: 400.0000 mg | ORAL_TABLET | Freq: Every day | ORAL | Status: DC

## 2022-04-21 NOTE — Plan of Care (Signed)
  Problem: Consults Goal: RH STROKE PATIENT EDUCATION Description: See Patient Education module for education specifics  Outcome: Progressing   Problem: RH BOWEL ELIMINATION Goal: RH STG MANAGE BOWEL WITH ASSISTANCE Description: STG Manage Bowel with mod I Assistance. Outcome: Progressing   Problem: RH BLADDER ELIMINATION Goal: RH STG MANAGE BLADDER WITH ASSISTANCE Description: STG Manage Bladder With mod I  Assistance Outcome: Progressing   Problem: RH SAFETY Goal: RH STG ADHERE TO SAFETY PRECAUTIONS W/ASSISTANCE/DEVICE Description: STG Adhere to Safety Precautions With cues Assistance/Device. Outcome: Progressing   Problem: RH COGNITION-NURSING Goal: RH STG USES MEMORY AIDS/STRATEGIES W/ASSIST TO PROBLEM SOLVE Description: STG Uses Memory Aids/Strategies With cues Assistance to Problem Solve. Outcome: Progressing   Problem: RH PAIN MANAGEMENT Goal: RH STG PAIN MANAGED AT OR BELOW PT'S PAIN GOAL Description: < 4 with prns Outcome: Progressing   Problem: RH KNOWLEDGE DEFICIT Goal: RH STG INCREASE KNOWLEDGE OF HYPERTENSION Description: Patient and caregiver will be able to manage HTN with medications and dietary modifications using educational resources independently  Outcome: Progressing Goal: RH STG INCREASE KNOWLEGDE OF HYPERLIPIDEMIA Description: Patient and caregiver will be able to manage HLD with medications and dietary modifications using educational resources independently Outcome: Progressing Goal: RH STG INCREASE KNOWLEDGE OF STROKE PROPHYLAXIS Description: Patient and caregiver will be able to manage secondary risks with medications and dietary modifications using educational resources independently Outcome: Progressing

## 2022-04-21 NOTE — Plan of Care (Signed)
  Problem: RH Cognition - SLP Goal: RH LTG Patient will demonstrate orientation with cues Description:  LTG:  Patient will demonstrate orientation to person/place/time/situation with cues (SLP)   Flowsheets (Taken 04/21/2022 1221) LTG Patient will demonstrate orientation to:  Person  Place  Time  Situation LTG: Patient will demonstrate orientation using cueing (SLP): Minimal Assistance - Patient > 75%   Problem: RH Comprehension Communication Goal: LTG Patient will comprehend basic/complex auditory (SLP) Description: LTG: Patient will comprehend basic/complex auditory information with cues (SLP). Flowsheets (Taken 04/21/2022 1222) LTG: Patient will comprehend: Basic auditory information LTG: Patient will comprehend auditory information with cueing (SLP): Minimal Assistance - Patient > 75%   Problem: RH Expression Communication Goal: LTG Patient will express needs/wants via multi-modal(SLP) Description: LTG:  Patient will express needs/wants via multi-modal communication (gestures/written, etc) with cues (SLP) Flowsheets (Taken 04/21/2022 1222) LTG: Patient will express needs/wants via multimodal communication (gestures/written, etc) with cueing (SLP): Minimal Assistance - Patient > 75% Goal: LTG Patient will verbally express basic/complex needs(SLP) Description: LTG:  Patient will verbally express basic/complex needs, wants or ideas with cues  (SLP) Flowsheets (Taken 04/21/2022 1222) LTG: Patient will verbally express basic/complex needs, wants or ideas (SLP): Minimal Assistance - Patient > 75%   Problem: RH Problem Solving Goal: LTG Patient will demonstrate problem solving for (SLP) Description: LTG:  Patient will demonstrate problem solving for basic/complex daily situations with cues  (SLP) Flowsheets (Taken 04/21/2022 1222) LTG Patient will demonstrate problem solving for: Minimal Assistance - Patient > 75%   Problem: RH Memory Goal: LTG Patient will use memory compensatory aids to  (SLP) Description: LTG:  Patient will use memory compensatory aids to recall biographical/new, daily complex information with cues (SLP) Flowsheets (Taken 04/21/2022 1222) LTG: Patient will use memory compensatory aids to (SLP): Minimal Assistance - Patient > 75%   Problem: RH Attention Goal: LTG Patient will demonstrate this level of attention during functional activites (SLP) Description: LTG:  Patient will will demonstrate this level of attention during functional activites (SLP) Flowsheets (Taken 04/21/2022 1222) LTG: Patient will demonstrate this level of attention during cognitive/linguistic activities with assistance of (SLP): Minimal Assistance - Patient > 75%   Problem: RH Awareness Goal: LTG: Patient will demonstrate awareness during functional activites type of (SLP) Description: LTG: Patient will demonstrate awareness during functional activites type of (SLP) Flowsheets (Taken 04/21/2022 1222) LTG: Patient will demonstrate awareness during cognitive/linguistic activities with assistance of (SLP): Minimal Assistance - Patient > 75%

## 2022-04-21 NOTE — Plan of Care (Signed)
Problem: Sit to Stand Goal: LTG:  Patient will perform sit to stand with assistance level (PT) Description: LTG:  Patient will perform sit to stand with assistance level (PT) Flowsheets (Taken 04/21/2022 1608) LTG: PT will perform sit to stand in preparation for functional mobility with assistance level: Supervision/Verbal cueing   Problem: RH Bed Mobility Goal: LTG Patient will perform bed mobility with assist (PT) Description: LTG: Patient will perform bed mobility with assistance, with/without cues (PT). Flowsheets (Taken 04/21/2022 1608) LTG: Pt will perform bed mobility with assistance level of: Supervision/Verbal cueing   Problem: RH Bed to Chair Transfers Goal: LTG Patient will perform bed/chair transfers w/assist (PT) Description: LTG: Patient will perform bed to chair transfers with assistance (PT). Flowsheets (Taken 04/21/2022 1608) LTG: Pt will perform Bed to Chair Transfers with assistance level: Supervision/Verbal cueing   Problem: RH Car Transfers Goal: LTG Patient will perform car transfers with assist (PT) Description: LTG: Patient will perform car transfers with assistance (PT). Flowsheets (Taken 04/21/2022 1608) LTG: Pt will perform car transfers with assist:: Supervision/Verbal cueing   Problem: RH Ambulation Goal: LTG Patient will ambulate in home environment (PT) Description: LTG: Patient will ambulate in home environment, # of feet with assistance (PT). Flowsheets (Taken 04/21/2022 1608) LTG: Pt will ambulate in home environ  assist needed:: Supervision/Verbal cueing LTG: Ambulation distance in home environment: 50' Goal: LTG Patient will ambulate in community environment (PT) Description: LTG: Patient will ambulate in community environment, # of feet with assistance (PT). Flowsheets (Taken 04/21/2022 1608) LTG: Pt will ambulate in community environ  assist needed:: Contact Guard/Touching assist LTG: Ambulation distance in community environment: 150'   Problem: RH  Wheelchair Mobility Goal: LTG Patient will propel w/c in community environment (PT) Description: LTG: Patient will propel wheelchair in community environment, # of feet with assist (PT) Flowsheets (Taken 04/21/2022 1608) LTG: Pt will propel w/c in community environ  assist needed:: Supervision/Verbal cueing Distance: wheelchair distance in controlled environment: 150   Problem: RH Stairs Goal: LTG Patient will ambulate up and down stairs w/assist (PT) Description: LTG: Patient will ambulate up and down # of stairs with assistance (PT) Flowsheets (Taken 04/21/2022 1608) LTG: Pt will ambulate up/down stairs assist needed:: Contact Guard/Touching assist LTG: Pt will  ambulate up and down number of stairs: 4

## 2022-04-21 NOTE — Discharge Instructions (Addendum)
Inpatient Rehab Discharge Instructions  [name] Discharge date and time: 04/28/22   Activities/Precautions/ Functional Status: Activity: no lifting, driving, or strenuous exercise till cleared by MD Diet: regular diet Wound Care: none needed   Functional status:  ___ No restrictions      ___ Walk up steps independently _X__ 24/7 supervision/assistance   ___ Walk up steps with assistance ___ Intermittent supervision/assistance  ___ Bathe/dress independently ___ Walk with walker     _X__ Bathe/dress with assistance ___ Walk Independently     ___ Shower independently ___ Walk with assistance     ___ Shower with assistance _X__ No alcohol      ___ Return to work/school ________  Special Instructions:   COMMUNITY REFERRALS UPON DISCHARGE:    Home Health:   PT       OT      ST                     Agency:[facility]   Phone:contact [name] (liaison) #[phone] *Please expect follow- up within 2-3 days to schedule your home visit. If you have not received follow-up, be sure to contact liaison directly.*    STROKE/TIA DISCHARGE INSTRUCTIONS SMOKING Cigarette smoking nearly doubles your risk of having a stroke & is the single most alterable risk factor  If you smoke or have smoked in the last 12 months, you are advised to quit smoking for your health. Most of the excess cardiovascular risk related to smoking disappears within a year of stopping. Ask you doctor about anti-smoking medications [facility] Quit Line: 1-800-QUIT NOW Free Smoking Cessation Classes [phone]  CHOLESTEROL Know your levels; limit fat & cholesterol in your diet  Lipid Panel     Component Value Date/Time   CHOL 210 (H) 04/17/2022 0554   TRIG 160 (H) 04/18/2022 0441   HDL 55 04/17/2022 0554   CHOLHDL 3.8 04/17/2022 0554   VLDL 20 04/17/2022 0554   LDLCALC 135 (H) 04/17/2022 0554     Many patients benefit from treatment even if their cholesterol is at goal. Goal: Total Cholesterol  (CHOL) less than 160 Goal:  Triglycerides (TRIG) less than 150 Goal:  HDL greater than 40 Goal:  LDL (LDLCALC) less than 100   BLOOD PRESSURE American Stroke Association blood pressure target is less that 120/80 mm/Hg  Your discharge blood pressure is:  BP: 120/79 Monitor your blood pressure Limit your salt and alcohol intake Many individuals will require more than one medication for high blood pressure  DIABETES (A1c is a blood sugar average for last 3 months) Goal HGBA1c is under 7% (HBGA1c is blood sugar average for last 3 months)  Diabetes: No known diagnosis of diabetes    Lab Results  Component Value Date   HGBA1C 4.6 (L) 04/17/2022    Your HGBA1c can be lowered with medications, healthy diet, and exercise. Check your blood sugar as directed by your physician Call your physician if you experience unexplained or low blood sugars.  PHYSICAL ACTIVITY/REHABILITATION Goal is 30 minutes at least 4 days per week  Activity: Increase activity slowly, and No driving, Therapies: see above Return to work: N/A Activity decreases your risk of heart attack and stroke and makes your heart stronger.  It helps control your weight and blood pressure; helps you relax and can improve your mood. Participate in a regular exercise program. Talk with your doctor about the best form of exercise for you (dancing, walking, swimming, cycling).  DIET/WEIGHT Goal is to  maintain a healthy weight  Your discharge diet is:  Diet Order             Diet regular Room service appropriate? Yes with Assist; Fluid consistency: Thin  Diet effective now                   liquids Your height is:  Height: 4\' 11"  (149.9 cm) Your current weight is: 132.6 lbs Your Body Mass Index (BMI) is:  BMI (Calculated): 26.84 Following the type of diet specifically designed for you will help prevent another stroke. You\r goal weight is 125 lbs Your goal Body Mass Index (BMI) is 19-24. Healthy food habits can help reduce 3 risk  factors for stroke:  High cholesterol, hypertension, and excess weight.  RESOURCES Stroke/Support Group:  Call [phone]   STROKE EDUCATION PROVIDED/REVIEWED AND GIVEN TO PATIENT Stroke warning signs and symptoms How to activate emergency medical system (call 911). Medications prescribed at discharge. Need for follow-up after discharge. Personal risk factors for stroke. Pneumonia vaccine given:  Flu vaccine given:  My questions have been answered, the writing is legible, and I understand these instructions.  I will adhere to these goals & educational materials that have been provided to me after my discharge from the hospital.     My questions have been answered and I understand these instructions. I will adhere to these goals and the provided educational materials after my discharge from the hospital.  Patient/Caregiver Signature _______________________________ Date __________  Clinician Signature _______________________________________ Date __________  Please bring this form and your medication list with you to all your follow-up doctor's appointments.    Information on my medicine - ELIQUIS (apixaban)  This medication education was reviewed with me or my healthcare representative as part of my discharge preparation.    Why was Eliquis prescribed for you? Eliquis was prescribed for you to reduce the risk of a blood clot forming that can cause a stroke if you have a medical condition called atrial fibrillation (a type of irregular heartbeat).  What do You need to know about Eliquis ? Take your Eliquis TWICE DAILY - one tablet in the morning and one tablet in the evening with or without food. If you have difficulty swallowing the tablet whole please discuss with your pharmacist how to take the medication safely.  Take Eliquis exactly as prescribed by your doctor and DO NOT stop taking Eliquis without talking to the doctor who prescribed the medication.  Stopping may  increase your risk of developing a stroke.  Refill your prescription before you run out.  After discharge, you should have regular check-up appointments with your healthcare provider that is prescribing your Eliquis.  In the future your dose may need to be changed if your kidney function or weight changes by a significant amount or as you get older.  What do you do if you miss a dose? If you miss a dose, take it as soon as you remember on the same day and resume taking twice daily.  Do not take more than one dose of ELIQUIS at the same time to make up a missed dose.  Important Safety Information A possible side effect of Eliquis is bleeding. You should call your healthcare provider right away if you experience any of the following: Bleeding from an injury or your nose that does not stop. Unusual colored urine (red or dark brown) or unusual colored stools (red or black). Unusual bruising for unknown reasons. A serious fall or if you hit  your head (even if there is no bleeding).  Some medicines may interact with Eliquis and might increase your risk of bleeding or clotting while on Eliquis. To help avoid this, consult your healthcare provider or pharmacist prior to using any new prescription or non-prescription medications, including herbals, vitamins, non-steroidal anti-inflammatory drugs (NSAIDs) and supplements.  This website has more information on Eliquis (apixaban): http://www.eliquis.com/eliquis/home  ==========================================================  Atrial Fibrillation    Atrial fibrillation is a type of heartbeat that is irregular or fast. If you have this condition, your heart beats without any order. This makes it hard for your heart to pump blood in a normal way. Atrial fibrillation may come and go, or it may become a long-lasting problem. If this condition is not treated, it can put you at higher risk for stroke, heart failure, and other heart problems.  What are  the causes? This condition may be caused by diseases that damage the heart. They include: High blood pressure. Heart failure. Heart valve disease. Heart surgery. Other causes include: Diabetes. Thyroid disease. Being overweight. Kidney disease. Sometimes the cause is not known.  What increases the risk? You are more likely to develop this condition if: You are older. You smoke. You exercise often and very hard. You have a family history of this condition. You are a man. You use drugs. You drink a lot of alcohol. You have lung conditions, such as emphysema, pneumonia, or COPD. You have sleep apnea.  What are the signs or symptoms? Common symptoms of this condition include: A feeling that your heart is beating very fast. Chest pain or discomfort. Feeling short of breath. Suddenly feeling light-headed or weak. Getting tired easily during activity. Fainting. Sweating. In some cases, there are no symptoms.  How is this treated? Treatment for this condition depends on underlying conditions and how you feel when you have atrial fibrillation. They include: Medicines to: Prevent blood clots. Treat heart rate or heart rhythm problems. Using devices, such as a pacemaker, to correct heart rhythm problems. Doing surgery to remove the part of the heart that sends bad signals. Closing an area where clots can form in the heart (left atrial appendage). In some cases, your doctor will treat other underlying conditions.  Follow these instructions at home:  Medicines Take over-the-counter and prescription medicines only as told by your doctor. Do not take any new medicines without first talking to your doctor. If you are taking blood thinners: Talk with your doctor before you take any medicines that have aspirin or NSAIDs, such as ibuprofen, in them. Take your medicine exactly as told by your doctor. Take it at the same time each day. Avoid activities that could hurt or bruise you.  Follow instructions about how to prevent falls. Wear a bracelet that says you are taking blood thinners. Or, carry a card that lists what medicines you take. Lifestyle         Do not use any products that have nicotine or tobacco in them. These include cigarettes, e-cigarettes, and chewing tobacco. If you need help quitting, ask your doctor. Eat heart-healthy foods. Talk with your doctor about the right eating plan for you. Exercise regularly as told by your doctor. Do not drink alcohol. Lose weight if you are overweight. Do not use drugs, including cannabis.  General instructions If you have a condition that causes breathing to stop for a short period of time (apnea), treat it as told by your doctor. Keep a healthy weight. Do not use diet pills unless  your doctor says they are safe for you. Diet pills may make heart problems worse. Keep all follow-up visits as told by your doctor. This is important.  Contact a doctor if: You notice a change in the speed, rhythm, or strength of your heartbeat. You are taking a blood-thinning medicine and you get more bruising. You get tired more easily when you move or exercise. You have a sudden change in weight.  Get help right away if:    You have pain in your chest or your belly (abdomen). You have trouble breathing. You have side effects of blood thinners, such as blood in your vomit, poop (stool), or pee (urine), or bleeding that cannot stop. You have any signs of a stroke. "BE FAST" is an easy way to remember the main warning signs: B - Balance. Signs are dizziness, sudden trouble walking, or loss of balance. E - Eyes. Signs are trouble seeing or a change in how you see. F - Face. Signs are sudden weakness or loss of feeling in the face, or the face or eyelid drooping on one side. A - Arms. Signs are weakness or loss of feeling in an arm. This happens suddenly and usually on one side of the body. S - Speech. Signs are sudden trouble  speaking, slurred speech, or trouble understanding what people say. T - Time. Time to call emergency services. Write down what time symptoms started. You have other signs of a stroke, such as: A sudden, very bad headache with no known cause. Feeling like you may vomit (nausea). Vomiting. A seizure.  These symptoms may be an emergency. Do not wait to see if the symptoms will go away. Get medical help right away. Call your local emergency services (911 in the [place] Do not drive yourself to the hospital. Summary Atrial fibrillation is a type of heartbeat that is irregular or fast. You are at higher risk of this condition if you smoke, are older, have diabetes, or are overweight. Follow your doctor's instructions about medicines, diet, exercise, and follow-up visits. Get help right away if you have signs or symptoms of a stroke. Get help right away if you cannot catch your breath, or you have chest pain or discomfort. This information is not intended to replace advice given to you by your health care provider. Make sure you discuss any questions you have with your health care provider. Document Revised: 08/23/2018 Document Reviewed: 08/23/2018 Elsevier Patient Education  2020 [employer].

## 2022-04-21 NOTE — Progress Notes (Signed)
[facility] Individual Statement of Services  Patient Name:  [name]  Date:  04/21/2022  Welcome to the [facility].  Our goal is to provide you with an individualized program based on your diagnosis and situation, designed to meet your specific needs.  With this comprehensive rehabilitation program, you will be expected to participate in at least 3 hours of rehabilitation therapies Monday-Friday, with modified therapy programming on the weekends.  Your rehabilitation program will include the following services:  Physical Therapy (PT), Occupational Therapy (OT), Speech Therapy (ST), 24 hour per day rehabilitation nursing, Therapeutic Recreaction (TR), Neuropsychology, Care Coordinator, Rehabilitation Medicine, Nutrition Services, Pharmacy Services, and Other  Weekly team conferences will be held on Wednesdays to discuss your progress.  Your Inpatient Rehabilitation Care Coordinator will talk with you frequently to get your input and to update you on team discussions.  Team conferences with you and your family in attendance may also be held.  Expected length of stay: 12-14 Days  Overall anticipated outcome: min assist to supervision   Depending on your progress and recovery, your program may change. Your Inpatient Rehabilitation Care Coordinator will coordinate services and will keep you informed of any changes. Your Inpatient Rehabilitation Care Coordinator's name and contact numbers are listed  below.  The following services may also be recommended but are not provided by the [facility]:   [place] will be made to provide these services after discharge if needed.  Arrangements include referral to agencies that provide these services.  Your insurance has been verified to be:   Medicare A&B Your primary doctor is:  Seward Meth,  NP  Pertinent information will be shared with your doctor and your insurance company.  Inpatient Rehabilitation Care Coordinator:  Cathleen Corti [phone] or [phone]  Information discussed with and copy given to patient by: Dyanne Iha, 04/21/2022, 9:28 AM

## 2022-04-21 NOTE — Progress Notes (Signed)
Patient's K+ continues to drift down despite current supplement--will increase to 40 meq bid. Likely compounded by low Mg--will add IV supplement today and follow for next few days.

## 2022-04-21 NOTE — Evaluation (Signed)
Physical Therapy Assessment and Plan  Patient Details  Name: [name] MRN: [id number] Date of Birth: March 21, 1925  PT Diagnosis: Abnormality of gait, Cognitive deficits, Difficulty walking, Edema, Hemiplegia non-dominant, Impaired cognition, Muscle weakness, and Osteoarthritis Rehab Potential: Good ELOS: 10-14   Today's Date: 04/21/2022 PT Individual Time: 5852-7782 PT Individual Time Calculation (min): 68 min    Hospital Problem: Principal Problem:   Acute ischemic right MCA stroke [facility])   Past Medical History:  Past Medical History:  Diagnosis Date   Abnormal gait 06/24/2015   Last Assessment & Plan:   Relevant Hx:  Course:  Daily Update:  Today's Plan:uses her cane for this and no recent falls     Electronically signed by: Mayer Camel, NP  07/23/15 1529   Acute cough 08/25/2011   Acute kidney injury superimposed on chronic kidney disease ([place]) 10/18/2020   Anemia due to stage 3 chronic kidney disease ([place]) 06/24/2015   Last Assessment & Plan: Formatting of this note might be different from the original. Relevant Hx: Course: Daily Update: Today's Plan:update her CMP for her Electronically signed by: Mayer Camel, NP 07/23/15 1528 Formatting of this note might be different from the original. Last Assessment & Plan: Relevant Hx: Course: Daily Update: Today's Plan:update her CMP for her Electronically    Anxiety 06/24/2015   Last Assessment & Plan:   Relevant Hx:  Course:  Daily Update:  Today's Plan:this is stable for her at this time     Electronically signed by: Mayer Camel, NP  07/23/15 1529   Aortic stenosis, severe    a. 07/2017: s/p TAVR w/ an Edwards Sapien 3 THV (size 26 mm, model # [id number], serial # [id number])   Atherosclerosis of coronary artery 06/24/2015   Formatting of this note might be different from the original. Ostial RCA DES, 2010 Last Assessment & Plan: stable Last Assessment & Plan: Relevant Hx: Course: Daily  Update: Today's Plan:she has felt she was stable from this and she has cardiologist she has followed with . Electronically signed by: Mayer Camel, NP 07/23/15 1523 Ostial RCA DES, 2010 Last Assessment & Plan: stable   Atrial fibrillation ([place]) 03/19/2022   Bilateral hearing loss 06/25/2015   Bilateral sensorineural hearing loss 12/26/2020   Breast cancer, right breast ([place]) 03/24/2008   CAD (coronary artery disease)    a. 2010: s/p stent to RCA   Chronic diastolic (congestive) heart failure ([place]) 08/04/2017   Chronic kidney disease, stage III (moderate) (H[facility])    Chronic recurrent major depressive disorder ([place]) 06/24/2015   Last Assessment & Plan:   Relevant Hx:  Course:  Daily Update:  Today's Plan:this is stable for her     Electronically signed by: Mayer Camel, NP  07/23/15 1530   Chronic sinusitis    Coronary artery disease involving native coronary artery of native heart with angina pectoris ([place]) 06/24/2015   Formatting of this note might be different from the original. Overview: Ostial RCA DES, 2010 Last Assessment & Plan: stable Last Assessment & Plan: Formatting of this note might be different from the original. Relevant Hx: Course: Daily Update: Today's Plan:she has felt she was stable from this and she has cardiologist she has followed with . Electronically signed by: Mayer Camel, N   Coronary artery disease involving native coronary artery of native heart without angina pectoris    Ostial RCA DES, 2010   Decreased hearing of both ears 06/25/2015   Deficiency anemia 03/11/2021   Dehydration  10/19/2020   Depressive disorder    Dizziness 08/14/2018   [place] of macula of both eyes 09/12/2018   Ductal carcinoma in situ of breast 06/23/2007   Encounter for long-term current use of high risk medication 06/24/2015   Epiretinal membrane (ERM) of left eye 09/12/2018   Essential hypertension, benign 02/16/2013   Gastro-esophageal reflux disease  with esophagitis 06/24/2015   Last Assessment & Plan:   Relevant Hx:  Course:  Daily Update:  Today's Plan:she is stable from this      Electronically signed by: Mayer Camel, NP  07/23/15 1526   Gastroesophageal reflux disease without esophagitis 12/27/2016   History of breast cancer    a. s/p lumpectomies and XRT   Hyperlipemia    Hyperlipidemia 02/16/2013   Hypertension    Hypothyroid    Hypothyroidism, acquired 06/24/2015   Last Assessment & Plan:   Relevant Hx:  Course:  Daily Update:  Today's Plan:update her TSH for her today     Electronically signed by: Mayer Camel, NP  07/23/15 1526   Incisional hernia, without obstruction or gangrene 02/24/2019   Insomnia    Intermediate stage nonexudative age-related macular degeneration of both eyes 01/19/2021   Irritation of oral cavity 11/23/2019   Malaise and fatigue 06/24/2015   Last Assessment & Plan:   Relevant Hx:  Course:  Daily Update:  Today's Plan:she is up and down with her energy and she feels that for the most part she has been stable     Electronically signed by: Mayer Camel, NP  07/23/15 1533   Malignant neoplasm of central portion of left female breast ([place]) 06/24/2015   Last Assessment & Plan:   Relevant Hx:  Course:  Daily Update:  Today's Plan:she is now receiving injections once a month for this and is hopeful will keep this at bay as this has been recurrent     Electronically signed by: Mayer Camel, NP  07/23/15 1530   Melena 03/19/2022   Memory change 07/23/2015   Last Assessment & Plan:   Relevant Hx:  Course:  Daily Update:  Today's Plan:she is going to take the aricept at supper     Electronically signed by: Mayer Camel, NP  07/23/15 1532   Memory loss    Multiple thyroid nodules 02/09/2012   Osteoarthritis    Osteoporosis 03/24/2020   Other allergic rhinitis 12/27/2016   Plantar fat pad atrophy 01/19/2016   Posterior vitreous detachment of left  eye 09/12/2018   Primary insomnia 06/24/2015   Last Assessment & Plan:   Relevant Hx:  Course:  Daily Update:  Today's Plan:she feels that this is stable for her at this time     Electronically signed by: Mayer Camel, NP  07/23/15 1531   Primary localized osteoarthrosis, ankle and foot 01/15/2016   Primary osteoarthritis involving multiple joints 06/24/2015   Last Assessment & Plan:   Relevant Hx:  Course:  Daily Update:  Today's Plan:she is stable from her joint though she has discomfort if she overexerts.     Electronically signed by: Mayer Camel, NP  07/23/15 1528   Reflux esophagitis    S/P TAVR (transcatheter aortic valve replacement) 08/02/2017   Severe aortic stenosis 02/16/2013   Physician Review  Conclusions: 1. Mild concentric left ventricular hypertrophy.  2. Left ventricular ejection fraction estimated by 2D at 60-65 percent.  3. There were no regional wall motion abnormalities.  4. Mild mitral annular calcification.  5. Trace mitral valve  regurgitation.  6. Trivial tricuspid regurgitation.  7. Moderate increased thickness and calcification of the trileaflet aortic val   Stage III chronic kidney disease ([place]) 06/24/2015   Last Assessment & Plan:   Relevant Hx:  Course:  Daily Update:  Today's Plan:update her CMP for her     Electronically signed by: Mayer Camel, NP  07/23/15 1528   Thyroid nodule 06/24/2015   Vitreous membranes and strands 01/19/2021   PPV on 02/11/2021 OS   Past Surgical History:  Past Surgical History:  Procedure Laterality Date   ANGIOPLASTY     BIOPSY  10/21/2020   Procedure: BIOPSY;  Surgeon: Carol Ada, MD;  Location: [facility];  Service: Endoscopy;;   BREAST LUMPECTOMY     x2   ESOPHAGOGASTRODUODENOSCOPY Left 10/21/2020   Procedure: ESOPHAGOGASTRODUODENOSCOPY (EGD);  Surgeon: Carol Ada, MD;  Location: [facility];  Service: Endoscopy;  Laterality: Left;  Abnormal barium swallow   FOOT TENDON SURGERY      GALLBLADDER SURGERY     INCISIONAL HERNIA REPAIR N/A 03/01/2019   Procedure: OPEN INCISIONAL HERNIA REPAIR;  Surgeon: Armandina Gemma, MD;  Location: [facility];  Service: General;  Laterality: N/A;   INSERTION OF MESH N/A 03/01/2019   Procedure: INSERTION OF MESH;  Surgeon: Armandina Gemma, MD;  Location: [facility];  Service: General;  Laterality: N/A;   INTRAOPERATIVE TRANSTHORACIC ECHOCARDIOGRAM N/A 08/02/2017   Procedure: INTRAOPERATIVE TRANSTHORACIC ECHOCARDIOGRAM;  Surgeon: Burnell Blanks, MD;  Location: [facility];  Service: Open Heart Surgery;  Laterality: N/A;   IR CT HEAD LTD  04/16/2022   IR PERCUTANEOUS ART THROMBECTOMY/INFUSION INTRACRANIAL INC DIAG ANGIO  04/16/2022   RADIOLOGY WITH ANESTHESIA N/A 04/16/2022   Procedure: IR WITH ANESTHESIA;  Surgeon: Luanne Bras, MD;  Location: [facility];  Service: Radiology;  Laterality: N/A;   REPLACEMENT TOTAL KNEE BILATERAL     RIGHT/LEFT HEART CATH AND CORONARY ANGIOGRAPHY N/A 07/05/2017   Procedure: RIGHT/LEFT HEART CATH AND CORONARY ANGIOGRAPHY;  Surgeon: Belva Crome, MD;  Location: [facility];  Service: Cardiovascular;  Laterality: N/A;   SKIN CANCER EXCISION  2018   right nostril    TOTAL ABDOMINAL HYSTERECTOMY     TRANSCATHETER AORTIC VALVE REPLACEMENT, TRANSFEMORAL N/A 08/02/2017   Procedure: TRANSCATHETER AORTIC VALVE REPLACEMENT, TRANSFEMORAL;  Surgeon: Burnell Blanks, MD;  Location: [facility];  Service: Open Heart Surgery;  Laterality: N/A;    Assessment & Plan Clinical Impression: Patient is a 89 year old female with history of CAD s/p DES to RCA '10, AS s/p TVAR '19, HTN, AF (declined AC), CKD III, mild memory loss, recurrent breast cancer, depression/anxiety, hx of candida esophagitis, Covid 11/23;  who was admitted on 04/16/22 with left sided weakness, difficulty speaking and right gaze preference.  CTA head/neck showed R-MCA M1 occlusion with incdental multiple thyroid nodules. TNK administered and she underwent cerebral angio  with thrombectomy. Post procedure hematoma treated with manual pressure and QuickClot and was on cleviprex for BP control. MRI brain done revealing acute infarcts in right temporal lobe, body of corpus callosum, right corona radiata and left centrum ovale with scattered areas of susceptibility question microhemorrhage or small volume pneumocephalus. She was noted to be Covid + but asymptomatic. She tolerated extubation and stroke felt to be embolic due to A fib. 2D showed EF 40-45% with global hypokinesis, moderate bilateral RA dilatation, small pericardial effusion and severe concentric LVH. She was weaned off cleviprex and Eliquis added today.    She has had issues with insomnia as well  as SOB/DOE. ST evaluation showed deficits in memory, comprehension and attention. She required NRB with therapy yesterday but reported to be better today. She continues to be limited by right foot pain, left sided weakness, cognitive deficits requiring increased time for simple commands and difficulty with sequencing. CIR recommended due to functional deficits.   Patient currently requires mod with mobility secondary to muscle weakness, decreased cardiorespiratoy endurance, decreased motor planning, decreased attention, decreased awareness, decreased problem solving, decreased safety awareness, decreased memory, and delayed processing, and decreased standing balance, hemiplegia, and decreased balance strategies.  Prior to hospitalization, patient was supervision with mobility and lived with Alone (ILF) in a Independent living facility home.  Home access is  Level entry.  Patient will benefit from skilled PT intervention to maximize safe functional mobility, minimize fall risk, and decrease caregiver burden for planned discharge home with 24 hour assist.  Anticipate patient will benefit from follow up [facility] at discharge.  PT - End of Session Activity Tolerance: Tolerates 30+ min activity with multiple rests Endurance Deficit:  Yes Endurance Deficit Description: Global deconditioning PT Assessment Rehab Potential (ACUTE/IP ONLY): Good PT Barriers to Discharge: Insurance for SNF coverage PT Patient demonstrates impairments in the following area(s): Balance;Behavior;Safety;Edema;Endurance;Motor;Pain PT Transfers Functional Problem(s): Bed Mobility;Bed to Chair;Car PT Locomotion Functional Problem(s): Ambulation;Wheelchair Mobility;Stairs PT Plan PT Intensity: Minimum of 1-2 x/day ,45 to 90 minutes PT Frequency: 5 out of 7 days PT Duration Estimated Length of Stay: 10-14 PT Treatment/Interventions: Ambulation/gait training;Community reintegration;DME/adaptive equipment instruction;Neuromuscular re-education;Psychosocial support;Stair training;UE/LE Strength taining/ROM;Wheelchair propulsion/positioning;Balance/vestibular training;Discharge planning;Functional electrical stimulation;Pain management;Therapeutic Activities;UE/LE Coordination activities;Cognitive remediation/compensation;Disease management/prevention;Functional mobility training;Patient/family education;Splinting/orthotics;Therapeutic Exercise;Visual/perceptual remediation/compensation PT Transfers Anticipated Outcome(s): Supv with LRAD PT Locomotion Anticipated Outcome(s): Supv with LRAD PT Recommendation Follow Up Recommendations: Home health PT Patient destination: Home (ILF with hired assistance) Equipment Recommended: To be determined Equipment Details: Owns rollator   PT Evaluation Precautions/Restrictions Precautions Precautions: Fall Restrictions Weight Bearing Restrictions: No Pain Interference Pain Interference Pain Effect on Sleep: 1. Rarely or not at all Pain Interference with Therapy Activities: 1. Rarely or not at all Pain Interference with Day-to-Day Activities: 1. 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 and "goes to school- 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 movement psychotherapist one is convenient" Leisure: Hobbies-yes (Comment) ("Meet with my friends and do something") Vision/Perception  Vision - History Ability to See in Adequate Light: 0 Adequate Perception Perception: Within Functional Limits Praxis Praxis: Intact  Cognition Overall Cognitive Status: Impaired/Different from baseline Arousal/Alertness: Awake/alert Orientation Level: Oriented to person;Oriented to situation Year: Other (Comment) ("2064") Month: February Day of Week: Incorrect Attention: Focused;Sustained Focused Attention: Appears intact Sustained Attention: Impaired Sustained Attention Impairment: Verbal basic Memory: Impaired Memory Impairment: Storage deficit;Decreased short term memory Decreased Short Term Memory: Verbal basic Awareness: Impaired Awareness Impairment: Emergent impairment Problem Solving: Impaired Reasoning: Impaired Safety/Judgment: Impaired Sensation Sensation Light Touch: Appears Intact Coordination Gross Motor Movements are Fluid and Coordinated: No Fine Motor Movements are Fluid and Coordinated: No Coordination and Movement Description: Global deconditioning Motor  Motor Motor: Hemiplegia Motor - Skilled Clinical Observations: mild L hemi   Trunk/Postural  Assessment  Cervical Assessment Cervical Assessment: Exceptions to [facility] (Forward head) Thoracic Assessment Thoracic Assessment: Exceptions to [facility] (Rounded  shoulders) Lumbar Assessment Lumbar Assessment: Exceptions to [facility] (Posterior pelvic tilt) Postural Control Postural Control: Deficits on evaluation (Delayed and inadequate) Righting Reactions: Delayed and inadequate  Balance Balance Balance Assessed: Yes Static Sitting Balance Static Sitting - Balance Support: Feet supported;Bilateral upper extremity supported Static Sitting - Level of Assistance: 5: Stand by assistance Dynamic Sitting Balance Dynamic Sitting - Balance Support: Feet supported;During functional activity Dynamic Sitting - Level of Assistance: 5: Stand by assistance Static Standing Balance Static Standing - Balance Support: Bilateral upper extremity supported Static Standing - Level of Assistance: 4: Min assist Dynamic Standing Balance Dynamic Standing - Balance Support: Bilateral upper extremity supported;During functional activity Dynamic Standing - Level of Assistance: 3: Mod assist Extremity Assessment      RLE Assessment RLE Assessment: Within Functional Limits General Strength Comments: Global decondiitoning, but grossly 4/5 LLE Assessment LLE Assessment: Exceptions to [facility] General Strength Comments: Global deconditioning with L hemi, grossly 3/5  Care Tool Care Tool Bed Mobility Roll left and right activity   Roll left and right assist level: Moderate Assistance - Patient 50 - 74%    Sit to lying activity   Sit to lying assist level: Moderate Assistance - Patient 50 - 74%    Lying to sitting on side of bed activity   Lying to sitting on side of bed assist level: the ability to move from lying on the back to sitting on the side of the bed with no back support.: Moderate Assistance - Patient 50 - 74%     Care Tool Transfers Sit to stand transfer   Sit to stand assist level: Moderate Assistance - Patient 50 - 74%    Chair/bed transfer   Chair/bed transfer assist level: Moderate Assistance - Patient 50 - 74%     Toilet transfer   Assist Level:  Moderate Assistance - Patient 50 - 74%    Car transfer   Car transfer assist level: Moderate Assistance - Patient 50 - 74%      Care Tool Locomotion Ambulation   Assist level: Moderate Assistance - Patient 50 - 74% Assistive device: Walker-rolling Max distance: 50'  Walk 10 feet activity   Assist level: Moderate Assistance - Patient - 50 - 74% Assistive device: Walker-rolling   Walk 50 feet with 2 turns activity   Assist level: Moderate Assistance - Patient - 50 - 74% Assistive device: Walker-rolling  Walk 150 feet activity Walk 150 feet activity did not occur: Safety/medical concerns      Walk 10 feet on uneven surfaces activity   Assist level: Moderate Assistance - Patient - 50 - 74% Assistive device: Walker-rolling  Stairs Stair activity did not occur: Safety/medical concerns (Patient was unable to perform stair mobility secondary to increased fatigue with poor endurance/activity tolerance)        Walk up/down 1 step activity Walk up/down 1 step or curb (drop down) activity did not occur: Safety/medical concerns      Walk up/down 4 steps activity Walk up/down 4 steps activity did not occur: Safety/medical concerns      Walk up/down 12 steps activity Walk up/down 12 steps activity did not occur: Safety/medical concerns      Pick up small objects from floor   Pick up small object from the floor assist level: Moderate Assistance - Patient 50 - 74% Pick up small object from the floor assistive device: RW and reacher  Wheelchair Is the patient using a wheelchair?: No Type of Wheelchair: Manual   Wheelchair assist level: Dependent - Patient 0% Max wheelchair distance: Therapist propelled manual wheelchair 150' for energy conservation as patient presents with poor endurance/activity tolerance  Wheel 50 feet with 2 turns activity   Assist Level: Dependent - Patient 0%  Wheel 150 feet activity   Assist Level: Dependent - Patient 0%    Refer to Care Plan for Long Term  Goals  SHORT TERM GOAL WEEK 1 PT Short Term Goal 1 (Week 1): Patient will perform bed mobility with MinA and LRAD PT Short Term Goal 2 (Week 1): Patient will ambulate 16' with LRAD and MinA PT Short Term Goal 3 (Week 1): Patient will perform transfers with LRAD and MinA  Recommendations for other services: None   Skilled Therapeutic Intervention Mobility Bed Mobility Bed Mobility: Rolling Right;Rolling Left;Supine to Sit;Sit to Supine Rolling Right: Moderate Assistance - Patient 50-74% Rolling Left: Moderate Assistance - Patient 50-74% Supine to Sit: Moderate Assistance - Patient 50-74% Sit to Supine: Moderate Assistance - Patient 50-74% Transfers Transfers: Sit to Stand;Stand to Sit;Stand Pivot Transfers Sit to Stand: Moderate Assistance - Patient 50-74% Stand to Sit: Moderate Assistance - Patient 50-74% Stand Pivot Transfers: Moderate Assistance - Patient 50 - 74% Stand Pivot Transfer Details: Verbal cues for safe use of DME/AE;Verbal cues for precautions/safety;Verbal cues for gait pattern;Verbal cues for technique;Verbal cues for sequencing;Tactile cues for sequencing;Tactile cues for weight shifting;Manual facilitation for weight shifting Transfer (Assistive device): Rolling walker Locomotion  Gait Ambulation: Yes Gait Assistance: Minimal Assistance - Patient > 75% Gait Distance (Feet): 50 Feet Assistive device: Rolling walker Gait Assistance Details: Tactile cues for weight shifting;Tactile cues for sequencing;Verbal cues for technique;Verbal cues for gait pattern;Verbal cues for safe use of DME/AE;Manual facilitation for weight shifting Gait Gait: Yes Gait Pattern: Impaired Gait Pattern: Decreased step length - left;Decreased stride length;Trunk flexed;Poor foot clearance - left Gait velocity: decreased Stairs / Additional Locomotion Stairs: No Ramp: Moderate Assistance - Patient 50 - 74% Wheelchair Mobility Wheelchair Mobility: No  Skilled Intervention- Patient  greeted supine in bed with daughter present and agreeable to PT treatment session. Patient required [place] for all functional mobility with the use of a RW while therapist managed IV pole. Patient requires increased time to complete all functional mobility tasks with re-direction as patient can become tangential. Patient and daughter educated on CLOF and [place] and both agreeable- Daughter stated caregivers could be hired for the day and provide physical assistance if needed.    Discharge Criteria: Patient will be discharged from PT if patient refuses treatment 3 consecutive times without medical reason, if treatment goals not met, if there is a change in medical status, if patient makes no progress towards goals or if patient is discharged from hospital.  The above assessment, treatment plan, treatment alternatives and goals were discussed and mutually agreed upon: by patient and by family  [place] 04/21/2022, 4:01 PM

## 2022-04-21 NOTE — Progress Notes (Signed)
Inpatient Rehabilitation Admission Medication Review by a Pharmacist  A complete drug regimen review was completed for this patient to identify any potential clinically significant medication issues.  High Risk Drug Classes Is patient taking? Indication by Medication  Antipsychotic Yes Prochlorperazine for nausea  Anticoagulant Yes Apixaban for atrial fibrillation  Antibiotic No   Opioid No   Antiplatelet No   Hypoglycemics/insulin No   Vasoactive Medication Yes Amlodipine, metoprolol for hypertension  Chemotherapy No   Other Yes Acetaminophen for mild pain Atorvastatin for hyperlipidemia Bupropion for depression Buspirone for anxiety Iron, magnesium, potassium for supplement  Pantoprazole for GI prophylaxis Albuterol prn for shortness of breath Diphenhydramine for itching Melatonin for sleep     Type of Medication Issue Identified Description of Issue Recommendation(s)  Drug Interaction(s) (clinically significant)     Duplicate Therapy     Allergy     No Medication Administration End Date     Incorrect Dose     Additional Drug Therapy Needed     Significant med changes from prior encounter (inform family/care partners about these prior to discharge). HELD home lomotil, eszopiclone, probiotic, diclofenac PO, clonidine, fluticasone  CHANGED metoprolol succinate to  metoprolol tartrate  NEW apixaban, atorvastatin, pantoprazole  Restart if clinically necessary     Switch back to succinate as able   Review at discharge  Pantoprazole was added for GI prophylaxis after thrombolysis, is not a home medications. Review if and how long patient needs to continue it   Other       Clinically significant medication issues were identified that warrant physician communication and completion of prescribed/recommended actions by midnight of the next day:  No  Name of provider notified for urgent issues identified:   Provider Method of Notification:     Pharmacist comments:    Time spent performing this drug regimen review (minutes):  [place], PharmD, BCPS, [facility] Clinical Pharmacist  Please check AMION for all [place] phone numbers After 10:00 PM, call [place]

## 2022-04-21 NOTE — Progress Notes (Signed)
Inpatient Rehabilitation  Patient information reviewed and entered into eRehab system by Sharnika Binney Shamonique Battiste, OTR/L, Rehab Quality Coordinator.   Information including medical coding, functional ability and quality indicators will be reviewed and updated through discharge.   

## 2022-04-21 NOTE — Evaluation (Signed)
Speech Language Pathology Assessment and Plan  Patient Details  Name: [name] MRN: [id number] Date of Birth: May 01, 1925  SLP Diagnosis: Cognitive Impairments;Speech and Language deficits  Rehab Potential: Good ELOS: 12-16 days    Today's Date: 04/21/2022 SLP Individual Time: 0820-0915 SLP Individual Time Calculation (min): 55 min and Today's Date: 04/21/2022 SLP Missed Time: 5 Minutes Missed Time Reason: Nursing care   Hospital Problem: Principal Problem:   Acute ischemic right MCA stroke [facility])  Past Medical History:  Past Medical History:  Diagnosis Date   Abnormal gait 06/24/2015   Last Assessment & Plan:   Relevant Hx:  Course:  Daily Update:  Today's Plan:uses her cane for this and no recent falls     Electronically signed by: Mayer Camel, NP  07/23/15 1529   Acute cough 08/25/2011   Acute kidney injury superimposed on chronic kidney disease ([place]) 10/18/2020   Anemia due to stage 3 chronic kidney disease ([place]) 06/24/2015   Last Assessment & Plan: Formatting of this note might be different from the original. Relevant Hx: Course: Daily Update: Today's Plan:update her CMP for her Electronically signed by: Mayer Camel, NP 07/23/15 1528 Formatting of this note might be different from the original. Last Assessment & Plan: Relevant Hx: Course: Daily Update: Today's Plan:update her CMP for her Electronically    Anxiety 06/24/2015   Last Assessment & Plan:   Relevant Hx:  Course:  Daily Update:  Today's Plan:this is stable for her at this time     Electronically signed by: Mayer Camel, NP  07/23/15 1529   Aortic stenosis, severe    a. 07/2017: s/p TAVR w/ an Edwards Sapien 3 THV (size 26 mm, model # [id number], serial # [id number])   Atherosclerosis of coronary artery 06/24/2015   Formatting of this note might be different from the original. Ostial RCA DES, 2010 Last Assessment & Plan: stable Last Assessment & Plan: Relevant Hx:  Course: Daily Update: Today's Plan:she has felt she was stable from this and she has cardiologist she has followed with . Electronically signed by: Mayer Camel, NP 07/23/15 1523 Ostial RCA DES, 2010 Last Assessment & Plan: stable   Atrial fibrillation ([place]) 03/19/2022   Bilateral hearing loss 06/25/2015   Bilateral sensorineural hearing loss 12/26/2020   Breast cancer, right breast ([place]) 03/24/2008   CAD (coronary artery disease)    a. 2010: s/p stent to RCA   Chronic diastolic (congestive) heart failure ([place]) 08/04/2017   Chronic kidney disease, stage III (moderate) (HCC)    Chronic recurrent major depressive disorder ([place]) 06/24/2015   Last Assessment & Plan:   Relevant Hx:  Course:  Daily Update:  Today's Plan:this is stable for her     Electronically signed by: Mayer Camel, NP  07/23/15 1530   Chronic sinusitis    Coronary artery disease involving native coronary artery of native heart with angina pectoris ([place]) 06/24/2015   Formatting of this note might be different from the original. Overview: Ostial RCA DES, 2010 Last Assessment & Plan: stable Last Assessment & Plan: Formatting of this note might be different from the original. Relevant Hx: Course: Daily Update: Today's Plan:she has felt she was stable from this and she has cardiologist she has followed with . Electronically signed by: Mayer Camel, N   Coronary artery disease involving native coronary artery of native heart without angina pectoris    Ostial RCA DES, 2010   Decreased hearing of both ears 06/25/2015   Deficiency  anemia 03/11/2021   Dehydration 10/19/2020   Depressive disorder    Dizziness 08/14/2018   [place]en of macula of both eyes 09/12/2018   Ductal carcinoma in situ of breast 06/23/2007   Encounter for long-term current use of high risk medication 06/24/2015   Epiretinal membrane (ERM) of left eye 09/12/2018   Essential hypertension, benign 02/16/2013   Gastro-esophageal  reflux disease with esophagitis 06/24/2015   Last Assessment & Plan:   Relevant Hx:  Course:  Daily Update:  Today's Plan:she is stable from this      Electronically signed by: Mayer Camel, NP  07/23/15 1526   Gastroesophageal reflux disease without esophagitis 12/27/2016   History of breast cancer    a. s/p lumpectomies and XRT   Hyperlipemia    Hyperlipidemia 02/16/2013   Hypertension    Hypothyroid    Hypothyroidism, acquired 06/24/2015   Last Assessment & Plan:   Relevant Hx:  Course:  Daily Update:  Today's Plan:update her TSH for her today     Electronically signed by: Mayer Camel, NP  07/23/15 1526   Incisional hernia, without obstruction or gangrene 02/24/2019   Insomnia    Intermediate stage nonexudative age-related macular degeneration of both eyes 01/19/2021   Irritation of oral cavity 11/23/2019   Malaise and fatigue 06/24/2015   Last Assessment & Plan:   Relevant Hx:  Course:  Daily Update:  Today's Plan:she is up and down with her energy and she feels that for the most part she has been stable     Electronically signed by: Mayer Camel, NP  07/23/15 1533   Malignant neoplasm of central portion of left female breast ([place]) 06/24/2015   Last Assessment & Plan:   Relevant Hx:  Course:  Daily Update:  Today's Plan:she is now receiving injections once a month for this and is hopeful will keep this at bay as this has been recurrent     Electronically signed by: Mayer Camel, NP  07/23/15 1530   Melena 03/19/2022   Memory change 07/23/2015   Last Assessment & Plan:   Relevant Hx:  Course:  Daily Update:  Today's Plan:she is going to take the aricept at supper     Electronically signed by: Mayer Camel, NP  07/23/15 1532   Memory loss    Multiple thyroid nodules 02/09/2012   Osteoarthritis    Osteoporosis 03/24/2020   Other allergic rhinitis 12/27/2016   Plantar fat pad atrophy 01/19/2016   Posterior vitreous  detachment of left eye 09/12/2018   Primary insomnia 06/24/2015   Last Assessment & Plan:   Relevant Hx:  Course:  Daily Update:  Today's Plan:she feels that this is stable for her at this time     Electronically signed by: Mayer Camel, NP  07/23/15 1531   Primary localized osteoarthrosis, ankle and foot 01/15/2016   Primary osteoarthritis involving multiple joints 06/24/2015   Last Assessment & Plan:   Relevant Hx:  Course:  Daily Update:  Today's Plan:she is stable from her joint though she has discomfort if she overexerts.     Electronically signed by: Mayer Camel, NP  07/23/15 1528   Reflux esophagitis    S/P TAVR (transcatheter aortic valve replacement) 08/02/2017   Severe aortic stenosis 02/16/2013   Physician Review  Conclusions: 1. Mild concentric left ventricular hypertrophy.  2. Left ventricular ejection fraction estimated by 2D at 60-65 percent.  3. There were no regional wall motion abnormalities.  4. Mild mitral annular calcification.  5. Trace mitral valve regurgitation.  6. Trivial tricuspid regurgitation.  7. Moderate increased thickness and calcification of the trileaflet aortic val   Stage III chronic kidney disease ([place]) 06/24/2015   Last Assessment & Plan:   Relevant Hx:  Course:  Daily Update:  Today's Plan:update her CMP for her     Electronically signed by: Mayer Camel, NP  07/23/15 1528   Thyroid nodule 06/24/2015   Vitreous membranes and strands 01/19/2021   PPV on 02/11/2021 OS   Past Surgical History:  Past Surgical History:  Procedure Laterality Date   ANGIOPLASTY     BIOPSY  10/21/2020   Procedure: BIOPSY;  Surgeon: Carol Ada, MD;  Location: [facility];  Service: Endoscopy;;   BREAST LUMPECTOMY     x2   ESOPHAGOGASTRODUODENOSCOPY Left 10/21/2020   Procedure: ESOPHAGOGASTRODUODENOSCOPY (EGD);  Surgeon: Carol Ada, MD;  Location: [facility];  Service: Endoscopy;  Laterality: Left;  Abnormal barium swallow   FOOT  TENDON SURGERY     GALLBLADDER SURGERY     INCISIONAL HERNIA REPAIR N/A 03/01/2019   Procedure: OPEN INCISIONAL HERNIA REPAIR;  Surgeon: Armandina Gemma, MD;  Location: [facility];  Service: General;  Laterality: N/A;   INSERTION OF MESH N/A 03/01/2019   Procedure: INSERTION OF MESH;  Surgeon: Armandina Gemma, MD;  Location: [facility];  Service: General;  Laterality: N/A;   INTRAOPERATIVE TRANSTHORACIC ECHOCARDIOGRAM N/A 08/02/2017   Procedure: INTRAOPERATIVE TRANSTHORACIC ECHOCARDIOGRAM;  Surgeon: Burnell Blanks, MD;  Location: [facility];  Service: Open Heart Surgery;  Laterality: N/A;   IR CT HEAD LTD  04/16/2022   IR PERCUTANEOUS ART THROMBECTOMY/INFUSION INTRACRANIAL INC DIAG ANGIO  04/16/2022   RADIOLOGY WITH ANESTHESIA N/A 04/16/2022   Procedure: IR WITH ANESTHESIA;  Surgeon: Luanne Bras, MD;  Location: [facility];  Service: Radiology;  Laterality: N/A;   REPLACEMENT TOTAL KNEE BILATERAL     RIGHT/LEFT HEART CATH AND CORONARY ANGIOGRAPHY N/A 07/05/2017   Procedure: RIGHT/LEFT HEART CATH AND CORONARY ANGIOGRAPHY;  Surgeon: Belva Crome, MD;  Location: [facility];  Service: Cardiovascular;  Laterality: N/A;   SKIN CANCER EXCISION  2018   right nostril    TOTAL ABDOMINAL HYSTERECTOMY     TRANSCATHETER AORTIC VALVE REPLACEMENT, TRANSFEMORAL N/A 08/02/2017   Procedure: TRANSCATHETER AORTIC VALVE REPLACEMENT, TRANSFEMORAL;  Surgeon: Burnell Blanks, MD;  Location: [facility];  Service: Open Heart Surgery;  Laterality: N/A;    Assessment / Plan / Recommendation Clinical Impression  HPI: KEMAYA DORNER is a 89 year old female with history of CAD s/p DES to RCA '10, AS s/p TVAR '19, HTN, AF (declined AC), CKD III, mild memory loss, recurrent breast cancer, depression/anxiety, hx of candida esophagitis, Covid 11/23;  who was admitted on 04/16/22 with left sided weakness, difficulty speaking and right gaze preference.  CTA head/neck showed R-MCA M1 occlusion with incdental multiple thyroid nodules.  TNK administered and she underwent cerebral angio with thrombectomy. Post procedure hematoma treated with manual pressure and QuickClot and was on cleviprex for BP control. MRI brain done revealing acute infarcts in right temporal lobe, body of corpus callosum, right corona radiata and left centrum ovale with scattered areas of susceptibility question microhemorrhage or small volume pneumocephalus. She was noted to be Covid + but asymptomatic. She tolerated extubation and stroke felt to be embolic due to A fib. 2D showed EF 40-45% with global hypokinesis, moderate bilateral RA dilatation, small pericardial effusion and severe concentric LVH. She was weaned off cleviprex and Eliquis added today.  She has had issues with insomnia as well as SOB/DOE. ST evaluation showed deficits in memory, comprehension and attention. She required NRB with therapy yesterday but reported to be better today. She continues to be limited by right foot pain, left sided weakness, cognitive deficits requiring increased time for simple commands and difficulty with sequencing. CIR recommended due to functional deficits.     Skilled Therapeutic Interventions          Pt seen for clinical swallow evaluation and informal speech and cognitive-linguistic assessment.   Swallowing Pt presents with  a functional oropharyngeal swallow per clinical swallow assessment. OME observed to be WNL. Oral prep and transit prompt with complete oral clearance. Pharyngeal swallow initiation appeared timely with laryngeal elevation noted. No overt or subtle s/s of aspiration were observed across PO trials. Pt did exhibit x1 instance of delayed gagging. She reported hx of esophagitis and that gagging behavior is normal for her. She denied hx of reflux or globus sensation with PO intake, though hx of GERD is noted in her H/P.   Recommend continue regular diet and thin liquids as tolerated. Recommend PO meds whole with liquids as tolerated.    Speech/Language/Cognition assessed with informal measures. Per acute SLP notes, SLUMS was attempted, though discontinued due to pt's test anxiety.   Speech: Conversational speech observed to be 100% fluent and intelligible with no dysarthria noted.   Language: Pt presents with mild expressive language impairments and suspected receptive language impairments, though further assessment is needed. Pt is significantly HOH with hearing aids in place. Expressive language impairments included reduced word-finding in conversation with pt unable to repair communication breakdown. Object naming and function WNL. Pt verbalized automatic sequences accurately and without effort. Reduced generative naming noted. Limited assessment of comprehension. Pt was able to comprehend questions regarding her wants/needs, though appeared to have difficulty comprehending directions for repetition task. Unable to complete Y/N questions and auditory directions due to time constraint.   Cognition: Pt exhibited cognitive deficits c/b reduced orientation, reduced awareness of deficits, and reduced recall (though reported memory deficit at baseline). Further cognitive-linguistic assessment is indicated. Pt oriented to self, DOB, and situation. Originally disoriented to place and time, though stated place correctly during physician's visit. Reported year is "2064". Aware that the month is February.   Pt would benefit from ongoing SLP services to address language and cognitive goals to increase pt independence and reduce caregiver burden. Recommend continue SLP PoC. Pt left sitting up in chair with chair alarm activated and call bell within reach.    No swallowing needs identified at this time.   SLP Assessment  Patient will need skilled Speech Lanaguage Pathology Services during CIR admission    Recommendations  SLP Diet Recommendations: Thin;Age appropriate regular solids Liquid Administration via: Cup;Straw Medication  Administration: Whole meds with liquid Supervision: Patient able to self feed Postural Changes and/or Swallow Maneuvers: Seated upright 90 degrees Oral Care Recommendations: Oral care BID Patient destination: Assisted Living Follow up Recommendations: Home Health SLP Equipment Recommended: None recommended by SLP    SLP Frequency 3 to 5 out of 7 days   SLP Duration  SLP Intensity  SLP Treatment/Interventions 12-16 days  Minumum of 1-2 x/day, 30 to 90 minutes  Cognitive remediation/compensation;Multimodal communication approach;Therapeutic Activities;Patient/family education    Pain Pain Assessment Pain Scale: 0-10 Pain Score: 0-No pain Faces Pain Scale: No hurt Pain Type: Chronic pain Pain Location: Ankle  Prior Functioning Cognitive/Linguistic Baseline: Baseline deficits Baseline deficit details: baseline memory imapirments per pt and  chart Type of Home: Independent living facility Available Help at Discharge: Personal care attendant;Available 24 hours/day  SLP Evaluation Cognition Overall Cognitive Status: No family/caregiver present to determine baseline cognitive functioning Arousal/Alertness: Awake/alert Orientation Level: Oriented to person;Oriented to situation Year:  (stated 2064 for the year) Month: February Day of Week: Incorrect Attention: Focused;Sustained Focused Attention: Appears intact Sustained Attention: Impaired Sustained Attention Impairment: Verbal basic Memory: Impaired Memory Impairment: Storage deficit;Decreased short term memory Decreased Short Term Memory: Verbal basic Awareness: Impaired Awareness Impairment: Emergent impairment Reasoning: Impaired  Comprehension Auditory Comprehension Overall Auditory Comprehension: Impaired Yes/No Questions: Impaired (further assessment needed) Basic Immediate Environment Questions: 0-24% accurate Commands:  (assessment needed) Conversation: Simple Interfering Components:  Hearing EffectiveTechniques: Extra processing time;Increased volume;Repetition Expression Expression Primary Mode of Expression: Verbal Verbal Expression Overall Verbal Expression: Impaired Initiation: No impairment Level of Generative/Spontaneous Verbalization: Conversation Repetition: Impaired (did not appear to understand task) Level of Impairment: Phrase level Naming: No impairment Pragmatics: No impairment Written Expression Dominant Hand: Right Oral Motor Oral Motor/Sensory Function Overall Oral Motor/Sensory Function: Within functional limits Motor Speech Overall Motor Speech: Appears within functional limits for tasks assessed Respiration: Within functional limits Phonation: Normal Resonance: Within functional limits Articulation: Within functional limitis Intelligibility: Intelligible Motor Planning: Witnin functional limits  Care Tool Care Tool Cognition Ability to hear (with hearing aid or hearing appliances if normally used Ability to hear (with hearing aid or hearing appliances if normally used): 2. Moderate difficulty - speaker has to increase volume and speak distinctly   Expression of Ideas and Wants Expression of Ideas and Wants: 3. Some difficulty - exhibits some difficulty with expressing needs and ideas (e.g, some words or finishing thoughts) or speech is not clear   Understanding Verbal and Non-Verbal Content Understanding Verbal and Non-Verbal Content: 3. Usually understands - understands most conversations, but misses some part/intent of message. Requires cues at times to understand  Memory/Recall Ability Memory/Recall Ability : Current season   PMSV Assessment  PMSV Trial Intelligibility: Intelligible  Bedside Swallowing Assessment General Date of Onset: 04/17/22 Previous Swallow Assessment: 04/18/22: Bedside swallow assessment in acute care Diet Prior to this Study: Regular;Thin liquids (Level 0) Temperature Spikes Noted: No Respiratory Status:  Room air History of Recent Intubation: No Behavior/Cognition: Alert;Cooperative;Pleasant mood Oral Cavity - Dentition: Adequate natural dentition Vision: Functional for self-feeding Patient Positioning: Upright in chair/Tumbleform Baseline Vocal Quality: Normal Volitional Swallow: Able to elicit  Oral Care Assessment Oral Assessment  (WDL): Within Defined Limits Lips: Symmetrical Teeth: Intact Tongue: Pink Mucous Membrane(s): Moist;Pink Saliva: Moist, saliva free flowing Level of Consciousness: Alert Is patient on any of following O2 devices?: None of the above Nutritional status: No high risk factors Oral Assessment Risk : Low Risk Ice Chips   Thin Liquid Thin Liquid: Within functional limits Presentation: Straw;Self Fed Nectar Thick   Honey Thick   Puree   Solid Solid: Within functional limits Presentation: Self Fed BSE Assessment Suspected Esophageal Findings Suspected Esophageal Findings: Gagging (pt reported hx of esophagitis) Risk for Aspiration Impact on safety and function: No limitations Other Related Risk Factors: Cognitive impairment  Short Term Goals: Week 1: SLP Short Term Goal 1 (Week 1): Pt will utilize external orientation and memory aids to answer orientation questions with 100% accuracy given min assistance. SLP Short Term Goal 2 (Week 1): Pt will complete functional problem solving tasks with >80% accuracy given min assistance. SLP Short Term Goal 3 (Week 1): Pt will utilize compensatory word-finding strategies to reduce communication breakdowns in >80% of opportunities given min  assistance. SLP Short Term Goal 4 (Week 1): Pt will complete language comprehension tasks to further determine comprehension goals as needed.  Refer to Care Plan for Long Term Goals  Recommendations for other services: None   Discharge Criteria: Patient will be discharged from SLP if patient refuses treatment 3 consecutive times without medical reason, if treatment goals  not met, if there is a change in medical status, if patient makes no progress towards goals or if patient is discharged from hospital.  The above assessment, treatment plan, treatment alternatives and goals were discussed and mutually agreed upon: by patient  Wyn Forster 04/21/2022, 12:15 PM

## 2022-04-21 NOTE — Progress Notes (Signed)
Infection Prevention  Received call from: AD 34M/W Regarding: Covid status of pt IP Recommendation: Reviewed patient notes. Pt with prior history of Covid in Nov. Currently with CVA issues and minor respiratory concerns per note. Reviewed CT value of most recent Covid test w/Micro. CT value is such that Air/Con is currently not needed for Pt [name]. Advised Conservation officer, historic buildings.

## 2022-04-21 NOTE — Progress Notes (Signed)
Occupational Therapy Assessment and Plan  Patient Details  Name: [name] MRN: [id number] Date of Birth: 02-22-1926  OT Diagnosis: abnormal posture, acute pain, cognitive deficits, hemiplegia affecting non-dominant side, and muscle weakness (generalized) Rehab Potential: Rehab Potential (ACUTE ONLY): Good ELOS: 8-12   Today's Date: 04/21/2022 OT Individual Time: 0938-1829 OT Individual Time Calculation (min): 75 min     Hospital Problem: Principal Problem:   Acute ischemic right MCA stroke [facility])   Past Medical History:  Past Medical History:  Diagnosis Date   Abnormal gait 06/24/2015   Last Assessment & Plan:   Relevant Hx:  Course:  Daily Update:  Today's Plan:uses her cane for this and no recent falls     Electronically signed by: Mayer Camel, NP  07/23/15 1529   Acute cough 08/25/2011   Acute kidney injury superimposed on chronic kidney disease ([place]) 10/18/2020   Anemia due to stage 3 chronic kidney disease ([place]) 06/24/2015   Last Assessment & Plan: Formatting of this note might be different from the original. Relevant Hx: Course: Daily Update: Today's Plan:update her CMP for her Electronically signed by: Mayer Camel, NP 07/23/15 1528 Formatting of this note might be different from the original. Last Assessment & Plan: Relevant Hx: Course: Daily Update: Today's Plan:update her CMP for her Electronically    Anxiety 06/24/2015   Last Assessment & Plan:   Relevant Hx:  Course:  Daily Update:  Today's Plan:this is stable for her at this time     Electronically signed by: Mayer Camel, NP  07/23/15 1529   Aortic stenosis, severe    a. 07/2017: s/p TAVR w/ an Edwards Sapien 3 THV (size 26 mm, model # [id number], serial # [id number])   Atherosclerosis of coronary artery 06/24/2015   Formatting of this note might be different from the original. Ostial RCA DES, 2010 Last Assessment & Plan: stable Last Assessment & Plan: Relevant Hx:  Course: Daily Update: Today's Plan:she has felt she was stable from this and she has cardiologist she has followed with . Electronically signed by: Mayer Camel, NP 07/23/15 1523 Ostial RCA DES, 2010 Last Assessment & Plan: stable   Atrial fibrillation ([place]) 03/19/2022   Bilateral hearing loss 06/25/2015   Bilateral sensorineural hearing loss 12/26/2020   Breast cancer, right breast ([place]) 03/24/2008   CAD (coronary artery disease)    a. 2010: s/p stent to RCA   Chronic diastolic (congestive) heart failure ([place]) 08/04/2017   Chronic kidney disease, stage III (moderate) (H[facility])    Chronic recurrent major depressive disorder ([place]) 06/24/2015   Last Assessment & Plan:   Relevant Hx:  Course:  Daily Update:  Today's Plan:this is stable for her     Electronically signed by: Mayer Camel, NP  07/23/15 1530   Chronic sinusitis    Coronary artery disease involving native coronary artery of native heart with angina pectoris ([place]) 06/24/2015   Formatting of this note might be different from the original. Overview: Ostial RCA DES, 2010 Last Assessment & Plan: stable Last Assessment & Plan: Formatting of this note might be different from the original. Relevant Hx: Course: Daily Update: Today's Plan:she has felt she was stable from this and she has cardiologist she has followed with . Electronically signed by: Mayer Camel, N   Coronary artery disease involving native coronary artery of native heart without angina pectoris    Ostial RCA DES, 2010   Decreased hearing of both ears 06/25/2015   Deficiency anemia 03/11/2021  Dehydration 10/19/2020   Depressive disorder    Dizziness 08/14/2018   [place] of macula of both eyes 09/12/2018   Ductal carcinoma in situ of breast 06/23/2007   Encounter for long-term current use of high risk medication 06/24/2015   Epiretinal membrane (ERM) of left eye 09/12/2018   Essential hypertension, benign 02/16/2013   Gastro-esophageal  reflux disease with esophagitis 06/24/2015   Last Assessment & Plan:   Relevant Hx:  Course:  Daily Update:  Today's Plan:she is stable from this      Electronically signed by: Mayer Camel, NP  07/23/15 1526   Gastroesophageal reflux disease without esophagitis 12/27/2016   History of breast cancer    a. s/p lumpectomies and XRT   Hyperlipemia    Hyperlipidemia 02/16/2013   Hypertension    Hypothyroid    Hypothyroidism, acquired 06/24/2015   Last Assessment & Plan:   Relevant Hx:  Course:  Daily Update:  Today's Plan:update her TSH for her today     Electronically signed by: Mayer Camel, NP  07/23/15 1526   Incisional hernia, without obstruction or gangrene 02/24/2019   Insomnia    Intermediate stage nonexudative age-related macular degeneration of both eyes 01/19/2021   Irritation of oral cavity 11/23/2019   Malaise and fatigue 06/24/2015   Last Assessment & Plan:   Relevant Hx:  Course:  Daily Update:  Today's Plan:she is up and down with her energy and she feels that for the most part she has been stable     Electronically signed by: Mayer Camel, NP  07/23/15 1533   Malignant neoplasm of central portion of left female breast ([place]) 06/24/2015   Last Assessment & Plan:   Relevant Hx:  Course:  Daily Update:  Today's Plan:she is now receiving injections once a month for this and is hopeful will keep this at bay as this has been recurrent     Electronically signed by: Mayer Camel, NP  07/23/15 1530   Melena 03/19/2022   Memory change 07/23/2015   Last Assessment & Plan:   Relevant Hx:  Course:  Daily Update:  Today's Plan:she is going to take the aricept at supper     Electronically signed by: Mayer Camel, NP  07/23/15 1532   Memory loss    Multiple thyroid nodules 02/09/2012   Osteoarthritis    Osteoporosis 03/24/2020   Other allergic rhinitis 12/27/2016   Plantar fat pad atrophy 01/19/2016   Posterior vitreous  detachment of left eye 09/12/2018   Primary insomnia 06/24/2015   Last Assessment & Plan:   Relevant Hx:  Course:  Daily Update:  Today's Plan:she feels that this is stable for her at this time     Electronically signed by: Mayer Camel, NP  07/23/15 1531   Primary localized osteoarthrosis, ankle and foot 01/15/2016   Primary osteoarthritis involving multiple joints 06/24/2015   Last Assessment & Plan:   Relevant Hx:  Course:  Daily Update:  Today's Plan:she is stable from her joint though she has discomfort if she overexerts.     Electronically signed by: Mayer Camel, NP  07/23/15 1528   Reflux esophagitis    S/P TAVR (transcatheter aortic valve replacement) 08/02/2017   Severe aortic stenosis 02/16/2013   Physician Review  Conclusions: 1. Mild concentric left ventricular hypertrophy.  2. Left ventricular ejection fraction estimated by 2D at 60-65 percent.  3. There were no regional wall motion abnormalities.  4. Mild mitral annular calcification.  5. Trace mitral  valve regurgitation.  6. Trivial tricuspid regurgitation.  7. Moderate increased thickness and calcification of the trileaflet aortic val   Stage III chronic kidney disease ([place]) 06/24/2015   Last Assessment & Plan:   Relevant Hx:  Course:  Daily Update:  Today's Plan:update her CMP for her     Electronically signed by: Mayer Camel, NP  07/23/15 1528   Thyroid nodule 06/24/2015   Vitreous membranes and strands 01/19/2021   PPV on 02/11/2021 OS   Past Surgical History:  Past Surgical History:  Procedure Laterality Date   ANGIOPLASTY     BIOPSY  10/21/2020   Procedure: BIOPSY;  Surgeon: Carol Ada, MD;  Location: [facility];  Service: Endoscopy;;   BREAST LUMPECTOMY     x2   ESOPHAGOGASTRODUODENOSCOPY Left 10/21/2020   Procedure: ESOPHAGOGASTRODUODENOSCOPY (EGD);  Surgeon: Carol Ada, MD;  Location: [facility];  Service: Endoscopy;  Laterality: Left;  Abnormal barium swallow   FOOT  TENDON SURGERY     GALLBLADDER SURGERY     INCISIONAL HERNIA REPAIR N/A 03/01/2019   Procedure: OPEN INCISIONAL HERNIA REPAIR;  Surgeon: Armandina Gemma, MD;  Location: [facility];  Service: General;  Laterality: N/A;   INSERTION OF MESH N/A 03/01/2019   Procedure: INSERTION OF MESH;  Surgeon: Armandina Gemma, MD;  Location: [facility];  Service: General;  Laterality: N/A;   INTRAOPERATIVE TRANSTHORACIC ECHOCARDIOGRAM N/A 08/02/2017   Procedure: INTRAOPERATIVE TRANSTHORACIC ECHOCARDIOGRAM;  Surgeon: Burnell Blanks, MD;  Location: [facility];  Service: Open Heart Surgery;  Laterality: N/A;   IR CT HEAD LTD  04/16/2022   IR PERCUTANEOUS ART THROMBECTOMY/INFUSION INTRACRANIAL INC DIAG ANGIO  04/16/2022   RADIOLOGY WITH ANESTHESIA N/A 04/16/2022   Procedure: IR WITH ANESTHESIA;  Surgeon: Luanne Bras, MD;  Location: [facility];  Service: Radiology;  Laterality: N/A;   REPLACEMENT TOTAL KNEE BILATERAL     RIGHT/LEFT HEART CATH AND CORONARY ANGIOGRAPHY N/A 07/05/2017   Procedure: RIGHT/LEFT HEART CATH AND CORONARY ANGIOGRAPHY;  Surgeon: Belva Crome, MD;  Location: [facility];  Service: Cardiovascular;  Laterality: N/A;   SKIN CANCER EXCISION  2018   right nostril    TOTAL ABDOMINAL HYSTERECTOMY     TRANSCATHETER AORTIC VALVE REPLACEMENT, TRANSFEMORAL N/A 08/02/2017   Procedure: TRANSCATHETER AORTIC VALVE REPLACEMENT, TRANSFEMORAL;  Surgeon: Burnell Blanks, MD;  Location: [facility];  Service: Open Heart Surgery;  Laterality: N/A;    Assessment & Plan Clinical Impression: Pt is a 89yo female who presented to ED with L sided weakness, L neglect, R gaze deviations, dysarthria, and chest pain. Imaging revealed  R M1 occlusionTNK given and s/p thrombectomy of R M1  PMH: afib not on anticoagulation, aortic stenosis status post TAVR, CAD, CKD stage III, depression, breast cancer, hyperlipidemia, hypertension, hypothyroidism, anxiety, and arthritis   Patient currently requires mod with basic self-care skills  secondary to muscle weakness, decreased cardiorespiratoy endurance, impaired timing and sequencing, decreased coordination, and decreased motor planning, decreased awareness, decreased problem solving, decreased safety awareness, and decreased memory, and decreased sitting balance, decreased standing balance, decreased postural control, and decreased balance strategies.  Prior to hospitalization, patient could complete BADL/IADL with min.  Patient will benefit from skilled intervention to decrease level of assist with basic self-care skills and increase independence with basic self-care skills prior to discharge home with care partner.  Anticipate patient will require 24 hour supervision and minimal physical assistance and follow up home health.  OT - End of Session Activity Tolerance: Tolerates 30+ min activity with multiple rests  Endurance Deficit: Yes OT Assessment Rehab Potential (ACUTE ONLY): Good OT Patient demonstrates impairments in the following area(s): Balance;Cognition;Edema;Endurance;Motor;Pain;Perception;Safety;Sensory OT Basic ADL's Functional Problem(s): Grooming;Bathing;Dressing;Toileting OT Transfers Functional Problem(s): Toilet;Tub/Shower OT Additional Impairment(s): Fuctional Use of Upper Extremity OT Plan OT Intensity: Minimum of 1-2 x/day, 45 to 90 minutes OT Frequency: 5 out of 7 days OT Duration/Estimated Length of Stay: 8-12 OT Treatment/Interventions: Balance/vestibular training;Discharge planning;Pain management;Self Care/advanced ADL retraining;Therapeutic Activities;UE/LE Coordination activities;Visual/perceptual remediation/compensation;Therapeutic Exercise;Skin care/wound managment;Patient/family education;Functional mobility training;Disease mangement/prevention;Cognitive remediation/compensation;Community reintegration;DME/adaptive equipment instruction;Neuromuscular re-education;Psychosocial support;Splinting/orthotics;UE/LE Strength taining/ROM;Wheelchair  propulsion/positioning OT Self Feeding Anticipated Outcome(s): S OT Basic Self-Care Anticipated Outcome(s): S-MIN OT Toileting Anticipated Outcome(s): MIN OT Bathroom Transfers Anticipated Outcome(s): CGA OT Recommendation Patient destination: Home Follow Up Recommendations: Home health OT Equipment Recommended: To be determined   OT Evaluation Precautions/Restrictions  Precautions Precautions: Fall Restrictions Weight Bearing Restrictions: No General Chart Reviewed: Yes Family/Caregiver Present: No Vital Signs Therapy Vitals Pulse Rate: 76 BP: 115/64 Pain Pain Assessment Pain Scale: 0-10 Pain Score: 0-No pain Faces Pain Scale: No hurt Pain Type: Chronic pain Pain Location: Ankle Home Living/Prior Functioning Home Living Family/patient expects to be discharged to:: Assisted living Living Arrangements: Other (Comment) Available Help at Discharge: Personal care attendant, Available 24 hours/day Type of Home: Independent living facility Home Access: Level entry Home Layout: One level Bathroom Shower/Tub: Gaffer, Architectural technologist: Handicapped height Prior Function Level of Independence: Requires assistive device for independence, Needs assistance with homemaking, Needs assistance with ADLs Driving: No Vision Baseline Vision/History: 1 Wears glasses Ability to See in Adequate Light: 0 Adequate Vision Assessment?: Yes Eye Alignment: Within Functional Limits Ocular Range of Motion: Within Functional Limits Alignment/Gaze Preference: Within Defined Limits Tracking/Visual Pursuits: Able to track stimulus in all quads without difficulty Saccades: Additional eye shifts occurred during testing (pt distracted when given directions) Convergence: Impaired (comment) (kept looking past tip of stimulus) Perception  Perception: Within Functional Limits Praxis Praxis: Intact Cognition Cognition Overall Cognitive Status: No family/caregiver present to determine  baseline cognitive functioning Arousal/Alertness: Awake/alert Memory: Impaired Memory Impairment: Storage deficit;Decreased short term memory Decreased Short Term Memory: Verbal basic Attention: Focused;Sustained Focused Attention: Appears intact Sustained Attention: Impaired Sustained Attention Impairment: Verbal basic Awareness: Impaired Awareness Impairment: Emergent impairment Reasoning: Impaired Brief Interview for Mental Status (BIMS) Repetition of Three Words (First Attempt): 3 Temporal Orientation: Year: Missed by more than 5 years Temporal Orientation: Month: Accurate within 5 days Temporal Orientation: Day: Incorrect Recall: "Sock": No, could not recall Recall: "Blue": Yes, no cue required Recall: "Bed": Yes, no cue required BIMS Summary Score: 9 Sensation Sensation Light Touch: Appears Intact Coordination Gross Motor Movements are Fluid and Coordinated: Yes Fine Motor Movements are Fluid and Coordinated: No Motor  Motor Motor: Hemiplegia Motor - Skilled Clinical Observations: mild L hemi  Trunk/Postural Assessment  Cervical Assessment Cervical Assessment:  (head forward) Thoracic Assessment Thoracic Assessment:  (rounded shoulders) Lumbar Assessment Lumbar Assessment:  (post pelvic tilt) Postural Control Postural Control: Deficits on evaluation (decreased/delayed)  Balance Balance Balance Assessed: Yes Dynamic Sitting Balance Dynamic Sitting - Level of Assistance: 5: Stand by assistance Static Standing Balance Static Standing - Level of Assistance: 4: Min assist Dynamic Standing Balance Dynamic Standing - Level of Assistance: 3: Mod assist Extremity/Trunk Assessment RUE Assessment RUE Assessment: Exceptions to [facility] General Strength Comments: generalized weakness pain in arthritic hands LUE Assessment LUE Assessment: Exceptions to [facility] General Strength Comments: generalized weakness, pain in arthritic hands  Care Tool Care Tool Self Care Eating    Eating Assist Level: Supervision/Verbal cueing  Oral Care    Oral Care Assist Level: Minimal Assistance - Patient > 75%    Bathing   Body parts bathed by patient: Right arm;Left arm;Chest;Abdomen;Front perineal area;Right upper leg;Left upper leg;Face Body parts bathed by helper: Buttocks;Right lower leg;Left lower leg   Assist Level: Moderate Assistance - Patient 50 - 74%    Upper Body Dressing(including orthotics)   What is the patient wearing?: Pull over shirt   Assist Level: Minimal Assistance - Patient > 75%    Lower Body Dressing (excluding footwear)   What is the patient wearing?: Pants Assist for lower body dressing: Maximal Assistance - Patient 25 - 49%    Putting on/Taking off footwear   What is the patient wearing?: Socks;Shoes Assist for footwear: Maximal Assistance - Patient 25 - 49%       Care Tool Toileting Toileting activity   Assist for toileting: Maximal Assistance - Patient 25 - 49%     Care Tool Bed Mobility Roll left and right activity        Sit to lying activity        Lying to sitting on side of bed activity         Care Tool Transfers Sit to stand transfer   Sit to stand assist level: Minimal Assistance - Patient > 75%    Chair/bed transfer   Chair/bed transfer assist level: Moderate Assistance - Patient 50 - 74%     Toilet transfer   Assist Level: Moderate Assistance - Patient 50 - 74%     Care Tool Cognition  Expression of Ideas and Wants Expression of Ideas and Wants: 3. Some difficulty - exhibits some difficulty with expressing needs and ideas (e.g, some words or finishing thoughts) or speech is not clear  Understanding Verbal and Non-Verbal Content Understanding Verbal and Non-Verbal Content: 3. Usually understands - understands most conversations, but misses some part/intent of message. Requires cues at times to understand   Memory/Recall Ability Memory/Recall Ability : Current season   Refer to Care Plan for Long Term  Goals  SHORT TERM GOAL WEEK 1 OT Short Term Goal 1 (Week 1): Pt will complete 2/3 steps of toileting OT Short Term Goal 2 (Week 1): Pt will thread BLE into pants with AE PRN OT Short Term Goal 3 (Week 1): Pt will don shirt with S OT Short Term Goal 4 (Week 1): Pt will groom in standing wiht CGA to demo improved activity tolerance  Recommendations for other services: Therapeutic Recreation  Pet therapy   Skilled Therapeutic Intervention ADL ADL Eating: Supervision/safety Grooming: Minimal assistance Where Assessed-Grooming: Standing at sink Upper Body Bathing: Minimal assistance Where Assessed-Upper Body Bathing: Standing at sink Lower Body Bathing: Minimal assistance Where Assessed-Lower Body Bathing: Sitting at sink;Standing at sink Upper Body Dressing: Minimal assistance Where Assessed-Upper Body Dressing: Sitting at sink Lower Body Dressing: Maximal assistance Where Assessed-Lower Body Dressing: Sitting at sink Toileting: Moderate assistance Where Assessed-Toileting: Bedside Commode;Glass blower/designer: Moderate assistance Toilet Transfer Method: Ambulating Mobility  Transfers Sit to Stand: Minimal Assistance - Patient > 75% Stand to Sit: Minimal Assistance - Patient > 75% SPT with MOD A  1:1. Pt educated on OT role/purpose, CIR, ELOS, and CVA recovery. Pt completes BADL at sink at sit to stand level as stated above. For all mobility in session pt demo decreased LLE stride length and poor R weight shift impacting safety with RW. Pt requires encouragement to try BADL tasks instead of OT just helping. Pt completes standing at sink for  oral care with MIN A and mild posterior bias. Pt arthritis in hands impacting grip/dexterity. Pt would benefit from adaptive strategies for joint protection. Exited session with pt seated in bed, exit alarm on and call light in reach    Discharge Criteria: Patient will be discharged from OT if patient refuses treatment 3 consecutive times  without medical reason, if treatment goals not met, if there is a change in medical status, if patient makes no progress towards goals or if patient is discharged from hospital.  The above assessment, treatment plan, treatment alternatives and goals were discussed and mutually agreed upon: by patient  Tonny Branch 04/21/2022, 12:17 PM

## 2022-04-21 NOTE — Progress Notes (Addendum)
PROGRESS NOTE   Subjective/Complaints:  No events overnight. C/o pain in her right foot and left hand. No endorsed injury. Longstanding intermittent arthritic pains amenable to Aspercreme.   ROS: +Joint pain - R foot, L hand Denies fevers, chills, N/V, abdominal pain, constipation, diarrhea, SOB, cough, chest pain, new weakness or paraesthesias.    Objective:   No results found. Recent Labs    04/20/22 0618 04/21/22 0707  WBC 9.1 8.5  HGB 9.3* 9.9*  HCT 30.4* 30.3*  PLT 227 236   Recent Labs    04/20/22 0618 04/21/22 0707  NA 135 137  K 3.4* 3.2*  CL 104 102  CO2 25 23  GLUCOSE 107* 121*  BUN 12 10  CREATININE 1.03* 1.00  CALCIUM 8.8* 9.0    Intake/Output Summary (Last 24 hours) at 04/21/2022 1532 Last data filed at 04/21/2022 1345 Gross per 24 hour  Intake 597 ml  Output 600 ml  Net -3 ml        Physical Exam: Vital Signs Blood pressure (!) 146/101, pulse (!) 101, temperature 97.9 F (36.6 C), temperature source Oral, resp. rate 16, height '4\' 11"'$  (1.499 m), weight 64.6 kg, SpO2 93 %. Constitutional: No apparent distress. Appropriate appearance for age.  HENT: Trachea midline. Atraumatic, normocephalic. +HOH, with b/l hearing aides Eyes: PERRLA. EOMI. Visual fields grossly intact.  Cardiovascular: RRR, no murmurs/rub/gallops. 1+ bilateral Edema. Peripheral pulses 2+  Respiratory: CTAB. No rales, rhonchi, or wheezing. On RA.  Abdomen: + bowel sounds, normoactive. No distention or tenderness.  GU: Not examined. Cannister with clear urine.  Skin: Mild ecchymosis on R hand. Otherwise c/d/I. Groin thrombectomy site not examined.   MSK:      No apparent deformity.      Strength: RUE 5-/5 LUE 4+/5 RLE HF 2/5; DF/PF 4+/5 LLE- HF 2/5; couldn't do knee testing B/L; DF/PF 4-/5   Neuro: AAOx3.  follows simple motor commands. + Mild left facial droop + mild dysarthria. + Left sided weakness noted. Decreased ROM  right foot. Decreased to light touch sensation on left side + L mild hemineglect    Assessment/Plan: 1. Functional deficits which require 3+ hours per day of interdisciplinary therapy in a comprehensive inpatient rehab setting. Physiatrist is providing close team supervision and 24 hour management of active medical problems listed below. Physiatrist and rehab team continue to assess barriers to discharge/monitor patient progress toward functional and medical goals  Care Tool:  Bathing    Body parts bathed by patient: Right arm, Left arm, Chest, Abdomen, Front perineal area, Right upper leg, Left upper leg, Face   Body parts bathed by helper: Buttocks, Right lower leg, Left lower leg     Bathing assist Assist Level: Moderate Assistance - Patient 50 - 74%     Upper Body Dressing/Undressing Upper body dressing   What is the patient wearing?: Pull over shirt    Upper body assist Assist Level: Minimal Assistance - Patient > 75%    Lower Body Dressing/Undressing Lower body dressing      What is the patient wearing?: Pants     Lower body assist Assist for lower body dressing: Maximal Assistance - Patient 25 - 49%  Toileting Toileting    Toileting assist Assist for toileting: Maximal Assistance - Patient 25 - 49%     Transfers Chair/bed transfer  Transfers assist     Chair/bed transfer assist level: Moderate Assistance - Patient 50 - 74%     Locomotion Ambulation   Ambulation assist              Walk 10 feet activity   Assist           Walk 50 feet activity   Assist           Walk 150 feet activity   Assist           Walk 10 feet on uneven surface  activity   Assist           Wheelchair     Assist               Wheelchair 50 feet with 2 turns activity    Assist            Wheelchair 150 feet activity     Assist          Blood pressure (!) 146/101, pulse (!) 101, temperature 97.9 F  (36.6 C), temperature source Oral, resp. rate 16, height '4\' 11"'$  (1.499 m), weight 64.6 kg, SpO2 93 %.  Medical Problem List and Plan: 1. 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  Antithrombotics: -DVT/anticoagulation:  Pharmaceutical: Eliquis--resumed 12/05             -antiplatelet therapy: N/A 3. Pain Management:  N/A   - 2/7: vOLTAREN GEL qid TO r FOOT, l HAND  4. Mood/Behavior/Sleep:  LCSW to follow for evaluation and support.              -antipsychotic agents: N/A             --sleep wake chart. Will add melatonin 10 mg/hs 5. Neuropsych/cognition: This patient is not capable of making decisions on his  own behalf.             --had mild cognitive impairments at baseline. Will add delirum precautions - had 24/7 caregivers at home  6. Skin/Wound Care: Routine pressure relief measures 7. Fluids/Electrolytes/Nutrition: Monitor I/O. Check CMET in am.    - Mg and K low on intake labs; replete 40 meq BID K; was already on Mag 400 mg dial (200 mg BID); d/t diarrhea will try IV repletion tiday and repeat labs 2/9  8. Chronic diastolic HF/global hypokinesis: EF-45-50% with severe concentric LVH --Monitor weights daily and for signs of overload --Lasix BID, metoprolol, Lipitor  Filed Weights   04/20/22 1826  Weight: 64.6 kg  2/7: LE edema stable; monitor weights  9. Covid 11/23/Hypoxia: At baseline had SOB/DOE w/activity--few steps -- Add flutter valve and encourage pulmonary hygiene.  --has been requiring NRB for activity  10. A fib: Monitor HR TID--on metoprolol and eliquis 11.  HTN: Monitor BP TID--on Metoprolol, Clonidine,    04/21/2022    2:58 PM 04/21/2022   11:48 AM 04/21/2022    4:26 AM  Vitals with BMI  Systolic 169 678 938  Diastolic 101 64 751  Pulse 101 76 93     12. Acute on chronic  renal failure: will check in AM  - Cr  stable 1.0  13.  Persistent hypokalemia: Supplement today-- increased to 40 meq BID 2/7  14. Hypomagnesemia: Mg- 1.6 --will supplement today - increased to 400 mg daily + IV  15. Acute on chronic anemia/Groin hematoma: Hgb 11.1--->9.3             --has had melanotic stools past month and referred to Dr. Benson Norway   - On iron supplement  - HgB stable  16. Left breast cancer: Has declined surgery and stop Fulvestrant due to SE.  17. H/o chronic Diarrhea: Liquid stools documented-->d/c colace and miralax.  --PTA felt to be anxiety related.  - Switch iron to every other day for improved absorption, decreased s/e - Limit increases in PO magnesium  - Consider immodium if ongoing  18. Chronic insomnia: Used Lunesta followed by Melatonin 10 mg every night per son.  19. Anxiety/depression: Back on Wellbutrin with buspar tid prn.  20. Urinary retention?: Will order PVR/bladder scans.- hadn't voided this AM   - PVRs not performed; incontinent intermittent voids, will wait for PVRs before treating    LOS: 1 days A FACE TO [place] 04/21/2022, 3:32 PM

## 2022-04-22 DIAGNOSIS — I63511 Cerebral infarction due to unspecified occlusion or stenosis of right middle cerebral artery: Secondary | ICD-10-CM | POA: Diagnosis not present

## 2022-04-22 LAB — MAGNESIUM: Magnesium: 1.7 mg/dL (ref 1.7–2.4)

## 2022-04-22 MED ORDER — ACETAMINOPHEN 325 MG PO TABS: 325.0000 mg | ORAL_TABLET | ORAL | Status: DC | PRN

## 2022-04-22 MED ORDER — ZOLPIDEM TARTRATE 5 MG PO TABS
5.0000 mg | ORAL_TABLET | Freq: Every evening | ORAL | Status: DC | PRN
  Administered 2022-04-22: 5 mg via ORAL
  Filled 2022-04-22: qty 1

## 2022-04-22 MED ORDER — MELATONIN 5 MG PO TABS
5.0000 mg | ORAL_TABLET | Freq: Every day | ORAL | Status: DC
  Administered 2022-04-22 – 2022-04-23 (×2): 5 mg via ORAL
  Filled 2022-04-22 (×2): qty 1

## 2022-04-22 MED ORDER — MAGNESIUM OXIDE -MG SUPPLEMENT 400 (240 MG) MG PO TABS
400.0000 mg | ORAL_TABLET | Freq: Two times a day (BID) | ORAL | Status: DC
  Administered 2022-04-22 – 2022-04-26 (×8): 400 mg via ORAL
  Filled 2022-04-22 (×9): qty 1

## 2022-04-22 MED ORDER — ZOLPIDEM TARTRATE 5 MG PO TABS: 5.0000 mg | ORAL_TABLET | Freq: Every day | ORAL | Status: DC

## 2022-04-22 NOTE — Progress Notes (Signed)
Physical Therapy Session Note  Patient Details  Name: [name] MRN: [id number] Date of Birth: 04-Sep-1925  Today's Date: 04/22/2022 PT Individual Time: 0815-0930 PT Individual Time Calculation (min): 75 min   Short Term Goals: Week 1:  PT Short Term Goal 1 (Week 1): Patient will perform bed mobility with MinA and LRAD PT Short Term Goal 2 (Week 1): Patient will ambulate 90' with LRAD and MinA PT Short Term Goal 3 (Week 1): Patient will perform transfers with LRAD and MinA  Skilled Therapeutic Interventions/Progress Updates:  Patient greeted sitting on the toilet in her bathroom with ST present and transitioned to PT care. Patient stood from toilet with R grab bar and MinA- While standing, therapist performed back pericare. Patient requested to sit back down on the toilet secondary to fatigue. While seated, patient donned shirt with MinA and threaded pants with total assist. Patient stood again from toilet with R grab bar and MinA- While standing, therapist pulled pants and brief over hips. Patient then performed a stand pivot transfer to the wheelchair with R grab bar and MinA. Patient sat upright in wheelchair and ate her cereal with banana with supv- Therapist noted x3-5 instances of coughing then sneezing throughout meal, ST notified and aware. While eating, therapist donned socks and tennis shoes for time management. Patient wheeled to rehab gym for time management and energy conservation.   Patient performed various sit/stands with RW and MinA for initiating standing- VC for scooting forward, increased anterior weight shift, tucking feet and proper hand placement.   Patient gait trained x107' with RW and MinA- Therapist providing manual facilitation for weight shifting L and R in order to improve bilateral step length, especially with L LE. Patient required increased time to complete with extended seated rest break secondary to fatigue.   Patient tasked with standing from the  wheelchair with RW, stepping forward (~3'), reaching overhead to take a clothespin off the basketball hoop and then stepping back toward the wheelchair and returning to a seated position- Performed x10 times with multiple rest breaks throughout. Patient required CGA/MinA throughout with VC for proper hand placement and sequencing.   Patient returned to her room sitting upright in wheelchair with tray table in front, posey belt on, call bell within reach and all needs met.    Therapy Documentation Precautions:  Precautions Precautions: Fall Restrictions Weight Bearing Restrictions: No   Therapy/Group: Individual Therapy  Ladonya Jerkins 04/22/2022, 7:56 AM

## 2022-04-22 NOTE — Progress Notes (Signed)
Inpatient Rehabilitation Care Coordinator Assessment and Plan Patient Details  Name: [name] MRN: [id number] Date of Birth: 04-04-1925  Today's Date: 04/22/2022  Hospital Problems: Principal Problem:   Acute ischemic right MCA stroke [facility])  Past Medical History:  Past Medical History:  Diagnosis Date   Abnormal gait 06/24/2015   Last Assessment & Plan:   Relevant Hx:  Course:  Daily Update:  Today's Plan:uses her cane for this and no recent falls     Electronically signed by: Mayer Camel, NP  07/23/15 1529   Acute cough 08/25/2011   Acute kidney injury superimposed on chronic kidney disease ([place]) 10/18/2020   Anemia due to stage 3 chronic kidney disease ([place]) 06/24/2015   Last Assessment & Plan: Formatting of this note might be different from the original. Relevant Hx: Course: Daily Update: Today's Plan:update her CMP for her Electronically signed by: Mayer Camel, NP 07/23/15 1528 Formatting of this note might be different from the original. Last Assessment & Plan: Relevant Hx: Course: Daily Update: Today's Plan:update her CMP for her Electronically    Anxiety 06/24/2015   Last Assessment & Plan:   Relevant Hx:  Course:  Daily Update:  Today's Plan:this is stable for her at this time     Electronically signed by: Mayer Camel, NP  07/23/15 1529   Aortic stenosis, severe    a. 07/2017: s/p TAVR w/ an Edwards Sapien 3 THV (size 26 mm, model # [id number], serial # [id number])   Atherosclerosis of coronary artery 06/24/2015   Formatting of this note might be different from the original. Ostial RCA DES, 2010 Last Assessment & Plan: stable Last Assessment & Plan: Relevant Hx: Course: Daily Update: Today's Plan:she has felt she was stable from this and she has cardiologist she has followed with . Electronically signed by: Mayer Camel, NP 07/23/15 1523 Ostial RCA DES, 2010 Last Assessment & Plan: stable   Atrial fibrillation ([place])  03/19/2022   Bilateral hearing loss 06/25/2015   Bilateral sensorineural hearing loss 12/26/2020   Breast cancer, right breast ([place]) 03/24/2008   CAD (coronary artery disease)    a. 2010: s/p stent to RCA   Chronic diastolic (congestive) heart failure ([place]) 08/04/2017   Chronic kidney disease, stage III (moderate) ([facility])    Chronic recurrent major depressive disorder ([place]) 06/24/2015   Last Assessment & Plan:   Relevant Hx:  Course:  Daily Update:  Today's Plan:this is stable for her     Electronically signed by: Mayer Camel, NP  07/23/15 1530   Chronic sinusitis    Coronary artery disease involving native coronary artery of native heart with angina pectoris ([place]) 06/24/2015   Formatting of this note might be different from the original. Overview: Ostial RCA DES, 2010 Last Assessment & Plan: stable Last Assessment & Plan: Formatting of this note might be different from the original. Relevant Hx: Course: Daily Update: Today's Plan:she has felt she was stable from this and she has cardiologist she has followed with . Electronically signed by: Mayer Camel, N   Coronary artery disease involving native coronary artery of native heart without angina pectoris    Ostial RCA DES, 2010   Decreased hearing of both ears 06/25/2015   Deficiency anemia 03/11/2021   Dehydration 10/19/2020   Depressive disorder    Dizziness 08/14/2018   [place]en of macula of both eyes 09/12/2018   Ductal carcinoma in situ of breast 06/23/2007   Encounter for long-term current use of high risk  medication 06/24/2015   Epiretinal membrane (ERM) of left eye 09/12/2018   Essential hypertension, benign 02/16/2013   Gastro-esophageal reflux disease with esophagitis 06/24/2015   Last Assessment & Plan:   Relevant Hx:  Course:  Daily Update:  Today's Plan:she is stable from this      Electronically signed by: Mayer Camel, NP  07/23/15 1526   Gastroesophageal reflux disease without  esophagitis 12/27/2016   History of breast cancer    a. s/p lumpectomies and XRT   Hyperlipemia    Hyperlipidemia 02/16/2013   Hypertension    Hypothyroid    Hypothyroidism, acquired 06/24/2015   Last Assessment & Plan:   Relevant Hx:  Course:  Daily Update:  Today's Plan:update her TSH for her today     Electronically signed by: Mayer Camel, NP  07/23/15 1526   Incisional hernia, without obstruction or gangrene 02/24/2019   Insomnia    Intermediate stage nonexudative age-related macular degeneration of both eyes 01/19/2021   Irritation of oral cavity 11/23/2019   Malaise and fatigue 06/24/2015   Last Assessment & Plan:   Relevant Hx:  Course:  Daily Update:  Today's Plan:she is up and down with her energy and she feels that for the most part she has been stable     Electronically signed by: Mayer Camel, NP  07/23/15 1533   Malignant neoplasm of central portion of left female breast ([place]) 06/24/2015   Last Assessment & Plan:   Relevant Hx:  Course:  Daily Update:  Today's Plan:she is now receiving injections once a month for this and is hopeful will keep this at bay as this has been recurrent     Electronically signed by: Mayer Camel, NP  07/23/15 1530   Melena 03/19/2022   Memory change 07/23/2015   Last Assessment & Plan:   Relevant Hx:  Course:  Daily Update:  Today's Plan:she is going to take the aricept at supper     Electronically signed by: Mayer Camel, NP  07/23/15 1532   Memory loss    Multiple thyroid nodules 02/09/2012   Osteoarthritis    Osteoporosis 03/24/2020   Other allergic rhinitis 12/27/2016   Plantar fat pad atrophy 01/19/2016   Posterior vitreous detachment of left eye 09/12/2018   Primary insomnia 06/24/2015   Last Assessment & Plan:   Relevant Hx:  Course:  Daily Update:  Today's Plan:she feels that this is stable for her at this time     Electronically signed by: Mayer Camel, NP  07/23/15  1531   Primary localized osteoarthrosis, ankle and foot 01/15/2016   Primary osteoarthritis involving multiple joints 06/24/2015   Last Assessment & Plan:   Relevant Hx:  Course:  Daily Update:  Today's Plan:she is stable from her joint though she has discomfort if she overexerts.     Electronically signed by: Mayer Camel, NP  07/23/15 1528   Reflux esophagitis    S/P TAVR (transcatheter aortic valve replacement) 08/02/2017   Severe aortic stenosis 02/16/2013   Physician Review  Conclusions: 1. Mild concentric left ventricular hypertrophy.  2. Left ventricular ejection fraction estimated by 2D at 60-65 percent.  3. There were no regional wall motion abnormalities.  4. Mild mitral annular calcification.  5. Trace mitral valve regurgitation.  6. Trivial tricuspid regurgitation.  7. Moderate increased thickness and calcification of the trileaflet aortic val   Stage III chronic kidney disease ([place]) 06/24/2015   Last Assessment & Plan:   Relevant Hx:  Course:  Daily Update:  Today's Plan:update her CMP for her     Electronically signed by: Mayer Camel, NP  07/23/15 1528   Thyroid nodule 06/24/2015   Vitreous membranes and strands 01/19/2021   PPV on 02/11/2021 OS   Past Surgical History:  Past Surgical History:  Procedure Laterality Date   ANGIOPLASTY     BIOPSY  10/21/2020   Procedure: BIOPSY;  Surgeon: Carol Ada, MD;  Location: [facility];  Service: Endoscopy;;   BREAST LUMPECTOMY     x2   ESOPHAGOGASTRODUODENOSCOPY Left 10/21/2020   Procedure: ESOPHAGOGASTRODUODENOSCOPY (EGD);  Surgeon: Carol Ada, MD;  Location: [facility];  Service: Endoscopy;  Laterality: Left;  Abnormal barium swallow   FOOT TENDON SURGERY     GALLBLADDER SURGERY     INCISIONAL HERNIA REPAIR N/A 03/01/2019   Procedure: OPEN INCISIONAL HERNIA REPAIR;  Surgeon: Armandina Gemma, MD;  Location: [facility];  Service: General;  Laterality: N/A;   INSERTION OF MESH N/A 03/01/2019   Procedure:  INSERTION OF MESH;  Surgeon: Armandina Gemma, MD;  Location: [facility];  Service: General;  Laterality: N/A;   INTRAOPERATIVE TRANSTHORACIC ECHOCARDIOGRAM N/A 08/02/2017   Procedure: INTRAOPERATIVE TRANSTHORACIC ECHOCARDIOGRAM;  Surgeon: Burnell Blanks, MD;  Location: [facility];  Service: Open Heart Surgery;  Laterality: N/A;   IR CT HEAD LTD  04/16/2022   IR PERCUTANEOUS ART THROMBECTOMY/INFUSION INTRACRANIAL INC DIAG ANGIO  04/16/2022   RADIOLOGY WITH ANESTHESIA N/A 04/16/2022   Procedure: IR WITH ANESTHESIA;  Surgeon: Luanne Bras, MD;  Location: [facility];  Service: Radiology;  Laterality: N/A;   REPLACEMENT TOTAL KNEE BILATERAL     RIGHT/LEFT HEART CATH AND CORONARY ANGIOGRAPHY N/A 07/05/2017   Procedure: RIGHT/LEFT HEART CATH AND CORONARY ANGIOGRAPHY;  Surgeon: Belva Crome, MD;  Location: [facility];  Service: Cardiovascular;  Laterality: N/A;   SKIN CANCER EXCISION  2018   right nostril    TOTAL ABDOMINAL HYSTERECTOMY     TRANSCATHETER AORTIC VALVE REPLACEMENT, TRANSFEMORAL N/A 08/02/2017   Procedure: TRANSCATHETER AORTIC VALVE REPLACEMENT, TRANSFEMORAL;  Surgeon: Burnell Blanks, MD;  Location: [facility];  Service: Open Heart Surgery;  Laterality: N/A;   Social History:  reports that she quit smoking about 58 years ago. Her smoking use included cigarettes. She has a 10.00 pack-year smoking history. She has never used smokeless tobacco. She reports current alcohol use. She reports that she does not use drugs.  Family / Support Systems Marital Status: Widow/Widower How Long?: N/A Spouse/Significant Other: N/A Children: Daughter, [name] and Son, Harrington Challenger Other Supports: Hired Caregivers Anticipated Caregiver: Son, daughter and caregivers Ability/Limitations of Caregiver: N/A Caregiver Availability: 24/7 Family Dynamics: Support from daughter, son and caregiver  Social History Preferred language: English Religion: Protestant Cultural Background: Patient living in [place] with 24/7  caregiver services. 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   Abuse/Neglect Abuse/Neglect Assessment Can Be Completed: Yes Physical Abuse: Denies Verbal Abuse: Denies Sexual Abuse: Denies Exploitation of patient/patient's resources: Denies Self-Neglect: Denies  Patient response to: Social Isolation - How often do you feel lonely or isolated from those around you?: Never  Emotional Status Pt's affect, behavior and adjustment status: N/A Recent Psychosocial Issues: N/A Psychiatric History: N/A Substance Abuse History: N/A  Patient / Family Perceptions, Expectations & Goals Pt/Family understanding of illness &  functional limitations: yes, daughter at bedside Premorbid pt/family roles/activities: ILF assistance fromcaregiver 24/7. ADL assistance Anticipated changes in roles/activities/participation: Caregivers 24/7 able to assist with caregiver needs Pt/family expectations/goals: Min A/Sup  [employer]: None Premorbid Home Care/DME Agencies: Other (Comment) Agricultural consultant) Transportation available at discharge: daughter Is the patient able to respond to transportation needs?: Yes In the past 12 months, has lack of transportation kept you from medical appointments or from getting medications?: No In the past 12 months, has lack of transportation kept you from meetings, work, or from getting things needed for daily living?: No Resource referrals recommended: Neuropsychology  Discharge Planning Living Arrangements: Other (Comment) (Caregivers) Support Systems: Children Type of Residence: [place] (Inclined parking lot) [place] Name: Other (enter name of facility below) [place] Name: [place] Resources: [employer] Financial Resources: Radio broadcast assistant Screen Referred: Yes Living Expenses: Other (Comment) Money Management: Family Does the patient have any problems obtaining your medications?: No Home Management: Assistance from caregivers or family Patient/Family Preliminary Plans: Able to continue Care Coordinator Anticipated Follow Up Needs: HH/OP Expected length of stay: 12-14 Days  Clinical Impression Covering for primary SW, [place].   Sw met with patient and daughter in room, introduced self and explained role. Patient and family anticipate patient discharging back to ILF [employer]). Patient has 24/7 hired caregivers. No additional questions or concerns.   Dyanne Iha 04/22/2022, 12:45 PM

## 2022-04-22 NOTE — Progress Notes (Signed)
Speech Language Pathology Daily Session Note  Patient Details  Name: [name] MRN: [id number] Date of Birth: 04/05/1925  Today's Date: 04/22/2022 SLP Individual Time: 1791-5056 SLP Individual Time Calculation (min): 45 min  Short Term Goals: Week 1: SLP Short Term Goal 1 (Week 1): Pt will utilize external orientation and memory aids to answer orientation questions with 100% accuracy given min assistance. SLP Short Term Goal 2 (Week 1): Pt will complete functional problem solving tasks with >80% accuracy given min assistance. SLP Short Term Goal 3 (Week 1): Pt will utilize compensatory word-finding strategies to reduce communication breakdowns in >80% of opportunities given min assistance. SLP Short Term Goal 4 (Week 1): Pt will complete language comprehension tasks to further determine comprehension goals as needed.  Skilled Therapeutic Interventions: Skilled treatment session focused on cognitive goals. Upon arrival, patient was asleep in bed and required extra time for arousal. Patient was initially disoriented to situation and declining to eat her breakfast and perseverative on requesting a box to take it home despite Max encouragement/cues from SLP. After passive orientation, patient was able to verbalize that she was in the hospital due to a stroke. Patient perseverative on getting dressed and required Max verbal cues for redirection. Patient requested to use the bathroom and required Mod A and Max verbal cues for sequencing and safety with task. Patient was continent of bowel and bladder. Patient handed off to PT. Continue with current plan of care.      Pain No/Denies Pain   Therapy/Group: Individual Therapy  Carmina Walle 04/22/2022, 12:46 PM

## 2022-04-22 NOTE — Progress Notes (Addendum)
PROGRESS NOTE   Subjective/Complaints:  No events overnight. C/o poor sleep overnight, states she work up at 4 am today. Generally states she does not have issues sleeping. Per record, she uses Lunesta and melatonin at home.   She endorses her diarrhea is about at baseline, has not noticed increased discomfort with magnesium supplement.   ROS: +Joint pain - R foot, L hand, +insomnia, +chronic diarrhea Denies fevers, chills, N/V, abdominal pain, constipation, SOB, cough, chest pain, new weakness or paraesthesias.    Objective:   No results found. Recent Labs    04/20/22 0618 04/21/22 0707  WBC 9.1 8.5  HGB 9.3* 9.9*  HCT 30.4* 30.3*  PLT 227 236    Recent Labs    04/20/22 0618 04/21/22 0707  NA 135 137  K 3.4* 3.2*  CL 104 102  CO2 25 23  GLUCOSE 107* 121*  BUN 12 10  CREATININE 1.03* 1.00  CALCIUM 8.8* 9.0     Intake/Output Summary (Last 24 hours) at 04/22/2022 1129 Last data filed at 04/22/2022 0845 Gross per 24 hour  Intake 956 ml  Output --  Net 956 ml         Physical Exam: Vital Signs Blood pressure (!) 153/82, pulse 87, temperature 97.6 F (36.4 C), temperature source Oral, resp. rate 18, height '4\' 11"'$  (1.499 m), weight 64.6 kg, SpO2 99 %. Constitutional: No apparent distress. Appropriate appearance for age.  HENT: Trachea midline. Atraumatic, normocephalic. +HOH, with b/l hearing aides Eyes: PERRLA. EOMI. Visual fields grossly intact.  Cardiovascular: RRR, no murmurs/rub/gallops. 1+ bilateral Edema. Peripheral pulses 2+  Respiratory: CTAB. No rales, rhonchi, or wheezing. On RA.  Abdomen: + bowel sounds, normoactive. No distention or tenderness.  GU: Not examined. Cannister with clear urine.  Skin: Mild ecchymosis on R hand. Otherwise c/d/I.   MSK:      No apparent deformity. Antigravity and against resistance in all 4 extremities; BL LE weakness 4/5 L>R   Neuro: AAOx3.  follows simple motor  commands. + Mild left facial droop + mild dysarthria, Decreased ROM right foot. Decreased to light touch sensation on left side + L mild hemineglect  - improved 2/8   Assessment/Plan: 1. Functional deficits which require 3+ hours per day of interdisciplinary therapy in a comprehensive inpatient rehab setting. Physiatrist is providing close team supervision and 24 hour management of active medical problems listed below. Physiatrist and rehab team continue to assess barriers to discharge/monitor patient progress toward functional and medical goals  Care Tool:  Bathing    Body parts bathed by patient: Right arm, Left arm, Chest, Abdomen, Front perineal area, Right upper leg, Left upper leg, Face   Body parts bathed by helper: Buttocks, Right lower leg, Left lower leg     Bathing assist Assist Level: Moderate Assistance - Patient 50 - 74%     Upper Body Dressing/Undressing Upper body dressing   What is the patient wearing?: Pull over shirt    Upper body assist Assist Level: Minimal Assistance - Patient > 75%    Lower Body Dressing/Undressing Lower body dressing      What is the patient wearing?: Pants     Lower body assist Assist  for lower body dressing: Maximal Assistance - Patient 25 - 49%     Toileting Toileting    Toileting assist Assist for toileting: Maximal Assistance - Patient 25 - 49%     Transfers Chair/bed transfer  Transfers assist     Chair/bed transfer assist level: Moderate Assistance - Patient 50 - 74%     Locomotion Ambulation   Ambulation assist      Assist level: Moderate Assistance - Patient 50 - 74% Assistive device: Walker-rolling Max distance: 50'   Walk 10 feet activity   Assist     Assist level: Moderate Assistance - Patient - 50 - 74% Assistive device: Walker-rolling   Walk 50 feet activity   Assist    Assist level: Moderate Assistance - Patient - 50 - 74% Assistive device: Walker-rolling    Walk 150 feet  activity   Assist Walk 150 feet activity did not occur: Safety/medical concerns         Walk 10 feet on uneven surface  activity   Assist     Assist level: Moderate Assistance - Patient - 50 - 74% Assistive device: Aeronautical engineer Is the patient using a wheelchair?: No Type of Wheelchair: Manual    Wheelchair assist level: Dependent - Patient 0% Max wheelchair distance: Therapist propelled manual wheelchair 150' for energy conservation as patient presents with poor endurance/activity tolerance    Wheelchair 50 feet with 2 turns activity    Assist        Assist Level: Dependent - Patient 0%   Wheelchair 150 feet activity     Assist      Assist Level: Dependent - Patient 0%   Blood pressure (!) 153/82, pulse 87, temperature 97.6 F (36.4 C), temperature source Oral, resp. rate 18, height '4\' 11"'$  (1.499 m), weight 64.6 kg, SpO2 99 %.  Medical Problem List and Plan: 1. 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  Antithrombotics: -DVT/anticoagulation:  Pharmaceutical: Eliquis--resumed 12/05             -antiplatelet therapy: N/A 3. Pain Management:  N/A   - 2/7: vOLTAREN GEL qid TO r FOOT, l HAND  4. Mood/Behavior/Sleep:  LCSW to follow for evaluation and support.              -antipsychotic agents: N/A             --sleep wake chart. Will add melatonin 10 mg/hs 5. Neuropsych/cognition: This patient is not capable of making decisions on his  own behalf.             --had mild cognitive impairments at baseline. Will add delirum precautions - had 24/7 caregivers at home  6. Skin/Wound Care: Routine pressure relief measures 7. Fluids/Electrolytes/Nutrition: Monitor I/O. Check CMET in am.    - Mg and K low on intake labs; replete 40 meq BID K; was already on Mag 400 mg dial (200 mg  BID); d/t diarrhea will try IV repletion tiday and repeat labs 2/9  - Mg 1.7 today. Increase PO mag to 400 mg BID d/t repeated lows/borderline low, repeat in AM labs 2/9  8. Chronic diastolic HF/global hypokinesis: EF-45-50% with severe concentric LVH --Monitor weights daily and for  signs of overload --Lasix BID, metoprolol, Lipitor  Filed Weights   04/20/22 1826  Weight: 64.6 kg  2/7: LE edema stable; monitor weights  9. Covid 11/23/Hypoxia: At baseline had SOB/DOE w/activity--few steps -- Add flutter valve and encourage pulmonary hygiene.  --has been requiring NRB for activity  10. A fib: Monitor HR TID--on metoprolol and eliquis 11.  HTN: Monitor BP TID--on Metoprolol, Clonidine,    04/22/2022    4:55 AM 04/21/2022    7:58 PM 04/21/2022    2:58 PM  Vitals with BMI  Systolic 737 106 269  Diastolic 82 99 485  Pulse 87 88 101     12. Acute on chronic renal failure: will check in AM  - Cr stable 1.0  13.  Persistent hypokalemia: Supplement today-- increased to 40 meq BID 2/7  14. Hypomagnesemia: Mg- 1.6 --will supplement today - increased to 400 mg daily + IV --> 400 mg BID 2/8  15. Acute on chronic anemia/Groin hematoma: Hgb 11.1--->9.3             --has had melanotic stools past month and referred to Dr. Benson [place]   - On iron supplement  - HgB stable  16. Left breast cancer: Has declined surgery and stop Fulvestrant due to SE.  17. H/o chronic Diarrhea: Liquid stools documented-->d/c colace and miralax.  --PTA felt to be anxiety related.  - Switch iron to every other day for improved absorption, decreased s/e - Tolerating PO magnesium - Consider immodium if worsening  - LBM 2/8, mushy  18. Chronic insomnia: Used Lunesta 3 mg followed by Melatonin 10 mg every night per son.    - 2/9: Start Melatonin 5 mg + Ambien 5 mg QHS PRN per formulary  19. Anxiety/depression: Back on Wellbutrin with buspar tid prn.   20. Urinary retention?: Will order PVR/bladder scans.- hadn't voided  this AM   - PVRs not performed; incontinent intermittent voids, will wait for PVRs before treating - 2/7, 2/8    LOS: 2 days A FACE TO [place] 04/22/2022, 11:29 AM

## 2022-04-22 NOTE — Progress Notes (Signed)
Occupational Therapy Session Note  Patient Details  Name: [name] MRN: [id number] Date of Birth: Mar 15, 1926  Today's Date: 04/22/2022 OT Individual Time: 1050-1200 OT Individual Time Calculation (min): 70 min    Short Term Goals: Week 1:  OT Short Term Goal 1 (Week 1): Pt will complete 2/3 steps of toileting OT Short Term Goal 2 (Week 1): Pt will thread BLE into pants with AE PRN OT Short Term Goal 3 (Week 1): Pt will don shirt with S OT Short Term Goal 4 (Week 1): Pt will groom in standing wiht CGA to demo improved activity tolerance  Skilled Therapeutic Interventions/Progress Updates:    Pt greeted seated in wc with head down asleep, OT able to wake patient easily. Pt declined need to go to the bathroom or perform any BADL tasks. Pt VERY hard of hearing and had difficulty understanding OT directions. OT brought to therapy gym in wc. Pt reluctantly participated in 5 minutes of UB there-ex using [employer] arm bike. Pt needed cues to maintain her attention within task. Utilized BITS to address memory, visual scanning, anterior weight shift in sitting, and reaching. Pt able to verbalize up to 3 words consecutively, but had difficulty then picking out the correlating image. Max cues to reach and touch correlating word on screen. Addressed sit<>stands and standing balance within BITS activity. Pt needed to use the bathroom. Worked on functional ambulation to bathroom in room w/ RW and min A. Pt needed encouragement to try to manage clothing with mod A, then min A to safely lower onto toilet. Pt needed set-up assist to collect toilet paper, but was able to perform her own peri-care. Daughter entered room and thought hearing aids had not been charged. OT made print out and placed behind bed as a reminder to charge hearing aids nightly. Pt left in wc with alarm belt on, call belll in reach, and needs met.   Therapy Documentation Precautions:  Precautions Precautions:  Fall Restrictions Weight Bearing Restrictions: No Pain:  Denies pain   Therapy/Group: Individual Therapy  Valma Cava 04/22/2022, 12:45 PM

## 2022-04-23 ENCOUNTER — Inpatient Hospital Stay (HOSPITAL_COMMUNITY): Payer: Medicare Other

## 2022-04-23 DIAGNOSIS — M79609 Pain in unspecified limb: Secondary | ICD-10-CM

## 2022-04-23 DIAGNOSIS — I63511 Cerebral infarction due to unspecified occlusion or stenosis of right middle cerebral artery: Secondary | ICD-10-CM | POA: Diagnosis not present

## 2022-04-23 LAB — BASIC METABOLIC PANEL
Anion gap: 12 (ref 5–15)
BUN: 13 mg/dL (ref 8–23)
CO2: 23 mmol/L (ref 22–32)
Calcium: 9.3 mg/dL (ref 8.9–10.3)
Chloride: 99 mmol/L (ref 98–111)
Creatinine, Ser: 0.91 mg/dL (ref 0.44–1.00)
GFR, Estimated: 58 mL/min — ABNORMAL LOW (ref >60)
Glucose, Bld: 102 mg/dL — ABNORMAL HIGH (ref 70–99)
Potassium: 3.7 mmol/L (ref 3.5–5.1)
Sodium: 134 mmol/L — ABNORMAL LOW (ref 135–145)

## 2022-04-23 LAB — MAGNESIUM: Magnesium: 1.7 mg/dL (ref 1.7–2.4)

## 2022-04-23 MED ORDER — AMLODIPINE BESYLATE 5 MG PO TABS
5.0000 mg | ORAL_TABLET | Freq: Two times a day (BID) | ORAL | Status: DC
  Administered 2022-04-23 – 2022-04-28 (×9): 5 mg via ORAL
  Filled 2022-04-23 (×11): qty 1

## 2022-04-23 NOTE — Progress Notes (Signed)
Notified Dr. Tressa Busman of patient's venous doppler results.    Yehuda Mao, LPN

## 2022-04-23 NOTE — Progress Notes (Signed)
PROGRESS NOTE   Subjective/Complaints:  Per reports, patient got PRN ambien overnight, became confused around midnight attempting OOB but redirectable. Slept poorly overall overnight. Today, she is oriented to self and place, but does not engage as she usually does. Responds to questions with "don't you know that!"; mildly irritable. Does endorse pain in her R calf today.   LBM mushy consistency, diarrhea seems to be improving.   ROS: +Joint pain - R foot, L hand, now R calf, +insomnia - worse, +chronic diarrhea - stable Denies fevers, chills, N/V, abdominal pain, constipation, SOB, cough, chest pain, new weakness or paraesthesias.    Objective:   No results found. Recent Labs    04/21/22 0707  WBC 8.5  HGB 9.9*  HCT 30.3*  PLT 236    Recent Labs    04/21/22 0707 04/23/22 0540  NA 137 134*  K 3.2* 3.7  CL 102 99  CO2 23 23  GLUCOSE 121* 102*  BUN 10 13  CREATININE 1.00 0.91  CALCIUM 9.0 9.3     Intake/Output Summary (Last 24 hours) at 04/23/2022 1120 Last data filed at 04/23/2022 0748 Gross per 24 hour  Intake 599.98 ml  Output --  Net 599.98 ml         Physical Exam: Vital Signs Blood pressure (!) 155/86, pulse (!) 101, temperature 98.2 F (36.8 C), resp. rate 17, height 4' 11"$  (1.499 m), weight 64.6 kg, SpO2 98 %. Constitutional: No apparent distress. Appropriate appearance for age.  HENT: Trachea midline. Atraumatic, normocephalic. +HOH, without b/l hearing aides in Eyes: PERRLA. EOMI. Visual fields grossly intact.  Cardiovascular: RRR, no murmurs/rub/gallops. 1+ LLE edema, 2+ RLE edema. Peripheral pulses 2+  Respiratory: CTAB. No rales, rhonchi, or wheezing. On RA.  Abdomen: + bowel sounds, normoactive. No distention or tenderness.  Skin: Mild ecchymosis on R hand. Otherwise c/d/I.   MSK: + TTP R calf squeeze      No apparent deformity. Antigravity and against resistance in all 4 extremities; BL LE  weakness 4/5 L>R   Neuro: AAOx3.  follows simple motor commands. + Mild left facial droop + mild dysarthria, Decreased ROM right foot. Decreased to light touch sensation on left side + L mild hemineglect  - not apprecaited on exam 2/9   Assessment/Plan: 1. Functional deficits which require 3+ hours per day of interdisciplinary therapy in a comprehensive inpatient rehab setting. Physiatrist is providing close team supervision and 24 hour management of active medical problems listed below. Physiatrist and rehab team continue to assess barriers to discharge/monitor patient progress toward functional and medical goals  Care Tool:  Bathing    Body parts bathed by patient: Right arm, Left arm, Chest, Abdomen, Front perineal area, Right upper leg, Left upper leg, Face   Body parts bathed by helper: Buttocks, Right lower leg, Left lower leg     Bathing assist Assist Level: Moderate Assistance - Patient 50 - 74%     Upper Body Dressing/Undressing Upper body dressing   What is the patient wearing?: Pull over shirt    Upper body assist Assist Level: Minimal Assistance - Patient > 75%    Lower Body Dressing/Undressing Lower body dressing  What is the patient wearing?: Pants     Lower body assist Assist for lower body dressing: Maximal Assistance - Patient 25 - 49%     Toileting Toileting    Toileting assist Assist for toileting: Maximal Assistance - Patient 25 - 49%     Transfers Chair/bed transfer  Transfers assist     Chair/bed transfer assist level: Moderate Assistance - Patient 50 - 74%     Locomotion Ambulation   Ambulation assist      Assist level: Moderate Assistance - Patient 50 - 74% Assistive device: Walker-rolling Max distance: 50'   Walk 10 feet activity   Assist     Assist level: Moderate Assistance - Patient - 50 - 74% Assistive device: Walker-rolling   Walk 50 feet activity   Assist    Assist level: Moderate Assistance - Patient  - 50 - 74% Assistive device: Walker-rolling    Walk 150 feet activity   Assist Walk 150 feet activity did not occur: Safety/medical concerns         Walk 10 feet on uneven surface  activity   Assist     Assist level: Moderate Assistance - Patient - 50 - 74% Assistive device: Aeronautical engineer Is the patient using a wheelchair?: Yes (per therapist documentation, dependent w/c use for energy conservation) Type of Wheelchair: Manual    Wheelchair assist level: Dependent - Patient 0% Max wheelchair distance: Therapist propelled manual wheelchair 150' for energy conservation as patient presents with poor endurance/activity tolerance    Wheelchair 50 feet with 2 turns activity    Assist        Assist Level: Dependent - Patient 0%   Wheelchair 150 feet activity     Assist      Assist Level: Dependent - Patient 0%   Blood pressure (!) 155/86, pulse (!) 101, temperature 98.2 F (36.8 C), resp. rate 17, height 4' 11"$  (1.499 m), weight 64.6 kg, SpO2 98 %.  Medical Problem List and Plan: 1. 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  Antithrombotics: -DVT/anticoagulation:  Pharmaceutical: Eliquis--resumed 12/05             -antiplatelet therapy: N/A 3. Pain Management:  N/A   - 2/7: vOLTAREN GEL qid TO r FOOT, l HAND  4. Mood/Behavior/Sleep:  LCSW to follow for evaluation and support.              -antipsychotic agents: N/A             --sleep wake chart. Will add melatonin 10 mg/hs 5. Neuropsych/cognition: This patient is not capable of making decisions on his  own behalf.             --had mild cognitive impairments at baseline. Will add delirum precautions - had 24/7 caregivers at home  6. Skin/Wound Care: Routine pressure relief measures 7. Fluids/Electrolytes/Nutrition: Monitor I/O.  Check CMET in am.    - Mg and K low on intake labs; replete 40 meq BID K; was already on Mag 400 mg dial (200 mg BID); d/t diarrhea will try IV repletion tiday and repeat labs 2/9  - Mg 1.7 today. Increase PO mag to 400 mg BID d/t repeated lows/borderline low,  repeat in AM labs 2/9  - 2/9: repeat stable 1.7; continue and repeat Monday  8. Chronic diastolic HF/global hypokinesis: EF-45-50% with severe concentric LVH --Monitor weights daily and for signs of overload --Lasix BID, metoprolol, Lipitor  Filed Weights   04/20/22 1826  Weight: 64.6 kg  2/7: LE edema stable; monitor weights 2/9: RLE > LLE edema; Duplex ordered RLE; discussed w/ nursing obtaining daily weights  9. Covid 11/23/Hypoxia: At baseline had SOB/DOE w/activity--few steps -- Add flutter valve and encourage pulmonary hygiene.  --has been requiring NRB for activity  10. A fib: Monitor HR TID--on metoprolol and eliquis 11.  HTN: Monitor BP TID--on Metoprolol, Clonidine, norvasc  - 2/9: Increase norvasc from 2.5 mg BID to 5 mg bid    04/23/2022    5:31 AM 04/22/2022    7:56 PM 04/22/2022   12:29 PM  Vitals with BMI  Systolic [id number]   [id number]  Diastolic 86   86 76 65  Pulse 101   101 92 96     12. Acute on chronic renal failure: will check in AM  - Cr stable 1.0; monitor  13.  Persistent hypokalemia: Supplement today-- increased to 40 meq BID 2/7--> improved to 3.7; repeat monday  14. Hypomagnesemia: Mg- 1.6 --will supplement today - increased to 400 mg daily + IV --> 400 mg BID 2/8; stable 1.7, continue and repeat Monday  15. Acute on chronic anemia/Groin hematoma: Hgb 11.1--->9.3             --has had melanotic stools past month and referred to Dr. Benson [place]   - On iron supplement  - HgB stable  16. Left breast cancer: Has declined surgery and stop Fulvestrant due to SE.  17. H/o chronic Diarrhea: Liquid stools documented-->d/c colace and miralax.  --PTA felt to be anxiety related.  - Switch iron to every other day  for improved absorption, decreased s/e - Tolerating PO magnesium - Consider immodium if worsening  - LBM 2/8, mushy  18. Chronic insomnia: Used Lunesta 3 mg followed by Melatonin 10 mg every night per son.    - 2/8: Start Melatonin 5 mg + Ambien 5 mg QHS PRN per formulary   - 2/9: Confusion overnight and this AM, poor sleep, ?d/t ambien. Will DC medication, discuss w/ son today bringing in [place] since not on formulary  19. Anxiety/depression: Back on Wellbutrin with buspar tid prn.   20. Urinary retention?: Will order PVR/bladder scans.- hadn't voided this AM   - PVRs not performed; incontinent intermittent voids, will wait for PVRs before treating - 2/7, 2/8  - 1x PVR 244 on 2/9; mildly elevated, will hold off on starting medications for urgency    LOS: 3 days A FACE TO FACE EVALUATION WAS PERFORMED  Gertie Gowda 04/23/2022, 11:20 AM

## 2022-04-23 NOTE — Progress Notes (Signed)
Physical Therapy Session Note  Patient Details  Name: [name] MRN: [id number] Date of Birth: [birth year]  Today's Date: 04/23/2022 PT Individual Time: 0945-1100 PT Individual Time Calculation (min): 75 min   Short Term Goals: Week 1:  PT Short Term Goal 1 (Week 1): Patient will perform bed mobility with MinA and LRAD PT Short Term Goal 2 (Week 1): Patient will ambulate 29' with LRAD and MinA PT Short Term Goal 3 (Week 1): Patient will perform transfers with LRAD and MinA  Skilled Therapeutic Interventions/Progress Updates:  Patient greeted sitting upright in wheelchair in room with family present and agreeable to PT treatment session- Family reporting patient is tired from previous session. Patient wheeled to rehab gym where physician performed morning rounds and then patient requested to use the restroom. Patient wheeled back to her room for time management. Patient stood from wheelchair with RW and MinA for improved anterior weight shift- VC for proper hand placement and leaning forward with poor carryover noted. Patient then ambulated to/from wheelchair and bathroom with RW and CGA/MinA- VC for stepping within the frame of the walker and increased B step length with therapist providing facilitation for improved lateral weight shifting. Patient with continent void while sitting on the toilet, however had some leakage in the brief and into her pants. While sitting on the toilet, therapist doffed soiled brief and pants and donned clean brief and pants for time management. Patient stood from toilet with R grab bar and RW with MinA. While standing, therapist performed pericare and pulled brief/pants over hips. Patient then ambulated back to her wheelchair with RW and CGA. Patient wheeled back to the gym for time management and energy conservation.   Patient stood from wheelchair with RW and MinA for increased anterior weight shift as patient demonstrated posterior bias. While standing,  patient performed 2 x 10 foot taps to 3.5" step with each LE in order to improve L LE stance and swing phases of gait. Patient required an extended seated rest break in between each set secondary to notable fatigue.   Patient gait trained x50' with a turn and CGA/MinA for improved lateral weight shifting. VC for increased B step length, improved foot clearance and stepping within the RW. Patient required increased time to complete gait trial.   Patient returned to her room where she ambulated ~10' from her wheelchair to sitting EOB with RW and MinA. Patient then transitioned from sitting EOB to supine with MinA for L LE management and repositioning of trunk. Patient left supine in bed with bed alarm on, call bell within reach, all needs met and RN notified of patient positioning.    Therapy Documentation Precautions:  Precautions Precautions: Fall Restrictions Weight Bearing Restrictions: No  Therapy/Group: Individual Therapy  Ali Mohl 04/23/2022, 7:57 AM

## 2022-04-23 NOTE — Progress Notes (Signed)
Lower extremity venous right study completed.  Preliminary results relayed to [place], [place].   See CV Proc for preliminary results report.   Darlin Coco, RDMS, RVT

## 2022-04-23 NOTE — Progress Notes (Signed)
Met with patient and patient's caregiver. Oriented to rehab and team conference. Discussed educational binder. Now taking eliquis. Discussed once home to take daily weight at same time every day. Monitor B/P at home as well. Discussed diet and energy conservation. Reports that would be out of breathe just transferring to toilet. Discussed foods to increase Ca, and mag. Discussed increased risk for another stroke and importance of monitoring b/p, cholesterol, A1C 4.6 (good) and watching daily weights and if SOB.  Discussed infection prevention in home as well.  All needs met. Call bell in reach.

## 2022-04-23 NOTE — Progress Notes (Signed)
Speech Language Pathology Daily Session Note  Patient Details  Name: [name] MRN: [id number] Date of Birth: [birth year]  Today's Date: 04/23/2022 SLP Individual Time: 1445-1530 SLP Individual Time Calculation (min): 45 min  Short Term Goals: Week 1: SLP Short Term Goal 1 (Week 1): Pt will utilize external orientation and memory aids to answer orientation questions with 100% accuracy given min assistance. SLP Short Term Goal 2 (Week 1): Pt will complete functional problem solving tasks with >80% accuracy given min assistance. SLP Short Term Goal 3 (Week 1): Pt will utilize compensatory word-finding strategies to reduce communication breakdowns in >80% of opportunities given min assistance. SLP Short Term Goal 4 (Week 1): Pt will complete language comprehension tasks to further determine comprehension goals as needed.  Skilled Therapeutic Interventions: Skilled ST treatment focused on cognitive goals and bedside swallow screen d/t report of coughing with PO intake yesterday as per pt's PT. Pt was greeted semi-reclined in bed on arrival and accompanied by her daughter. Pt was oriented to person, place, and situation independent of cues and engaged in simple conversational exchange with minimal word finding difficulty observed. Pt benefited from verbal repetition for comprehension of questions, however this appeared attributed to hearing loss. Apparently one of pt's hearing aids is broken and daughter is taking it to get fixed.   SLP inquired about pt's diet tolerance and current swallow function. Both pt and daughter denied any noticeable changes to pt's chewing/swallowing s/p CVA. Pt reports occasional coughing and belching with PO intake, however reported this has been occurring for years and likely associated with hx of esophagitis. Daughter denied any observations of coughing with PO intake as of recent. Pt in agreement with participating in swallow screen. OME appeared unremarkable.  Oropharyngeal swallow function appeared [facility] and appropriate for consumption of current diet of regular textures and thin liquids. Pt consumed single and sequential sips of thin liquids via straw with timely and effective oral preparation, excellent oral clearance, no clinical s/sx of aspiration, and no complaints of pharyngeal or esophageal retention. SLP provided education on general swallowing and reflux precautions. Pt and dtr verbalized understanding through teach back. Pt did report large pills (vitamins) can be difficult to swallow, therefore suggested for large pills to be cut in half as appropriate and/or take in pudding or applesauce. No further swallow needs are identified at this time. Patient was left in bed with alarm activated and immediate needs within reach at end of session. Continue per current plan of care.    Pain None/denied   Therapy/Group: Individual Therapy  Patty Sermons 04/23/2022, 7:14 PM

## 2022-04-23 NOTE — Progress Notes (Addendum)
Patient ID: [name], female   DOB: [birth year], 89 y.o.   MRN: [id number]  [id number] left message for [name] Page/Director of Independent Living with [employer] [phone]) to introduce self, inform on ELOS, and will follow-up with updates after team conference. Encouraged follow-up if needed.   1136-SW left message for pt dtr [name] to introduce self, and will follow-up with updates after team conference. SW encouraged follow-up if needed.  *SW received return phone call from pt dtr. She is currently here in the hospital.  [name], MSW, [place] Office: [phone] Cell: [phone] Fax: [phone]

## 2022-04-23 NOTE — IPOC Note (Signed)
Overall Plan of Care [facility]) Patient Details Name: [name] MRN: [id number] DOB: May 26, 1925  Admitting Diagnosis: Acute ischemic right MCA stroke [facility])  Hospital Problems: Principal Problem:   Acute ischemic right MCA stroke ([place])     Functional Problem List: Nursing Bladder, Behavior, Endurance, Safety, Bowel, Medication Management, Pain  PT Balance, Behavior, Safety, Edema, Endurance, Motor, Pain  OT Balance, Cognition, Edema, Endurance, Motor, Pain, Perception, Safety, Sensory  SLP Cognition, Linguistic  TR         Basic ADL's: OT Grooming, Bathing, Dressing, Toileting     Advanced  ADL's: OT       Transfers: PT Bed Mobility, Bed to Chair, Car  OT Toilet, Tub/Shower     Locomotion: PT Ambulation, Emergency planning/management officer, Stairs     Additional Impairments: OT Fuctional Use of Upper Extremity  SLP Communication, Social Cognition expression, comprehension Attention, Problem Solving, Memory (memory aids as needed)  TR      Anticipated Outcomes Item Anticipated Outcome  Self Feeding S  Swallowing      Basic self-care  S-MIN  Toileting  MIN   Bathroom Transfers CGA  Bowel/Bladder  manage bowel w mod I assist and bladder w toileting  Transfers  Supv with LRAD  Locomotion  Supv with LRAD  Communication  Min A  Cognition  Min A  Pain  < 4 with prns  Safety/Judgment  manage w cues   Therapy Plan: PT Intensity: Minimum of 1-2 x/day ,45 to 90 minutes PT Frequency: 5 out of 7 days PT Duration Estimated Length of Stay: 10-14 OT Intensity: Minimum of 1-2 x/day, 45 to 90 minutes OT Frequency: 5 out of 7 days OT Duration/Estimated Length of Stay: 8-12 SLP Intensity: Minumum of 1-2 x/day, 30 to 90 minutes SLP Frequency: 3 to 5 out of 7 days SLP Duration/Estimated Length of Stay: 12-16 days   Team Interventions: Nursing Interventions Bladder Management, Medication Management, Discharge Planning, Bowel Management, Disease Management/Prevention,  Pain Management, Patient/Family Education  PT interventions Ambulation/gait training, Community reintegration, DME/adaptive equipment instruction, Neuromuscular re-education, Psychosocial support, Stair training, UE/LE Strength taining/ROM, Wheelchair propulsion/positioning, Training and development officer, Discharge planning, Functional electrical stimulation, Pain management, Therapeutic Activities, UE/LE Coordination activities, Cognitive remediation/compensation, Disease management/prevention, Functional mobility training, Patient/family education, Splinting/orthotics, Therapeutic Exercise, Visual/perceptual remediation/compensation  OT Interventions Balance/vestibular training, Discharge planning, Pain management, Self Care/advanced ADL retraining, Therapeutic Activities, UE/LE Coordination activities, Visual/perceptual remediation/compensation, Therapeutic Exercise, Skin care/wound managment, Patient/family education, Functional mobility training, Disease mangement/prevention, Cognitive remediation/compensation, Community reintegration, Engineer, drilling, Neuromuscular re-education, Psychosocial support, Splinting/orthotics, UE/LE Strength taining/ROM, Wheelchair propulsion/positioning  SLP Interventions Cognitive remediation/compensation, Multimodal communication approach, Therapeutic Activities, Patient/family education  TR Interventions    SW/CM Interventions Discharge Planning, Psychosocial Support, Patient/Family Education, Disease Management/Prevention   Barriers to Discharge MD  Medical stability, Home enviroment access/loayout, Incontinence, Lack of/limited family support, and Weight  Nursing Lack of/limited family support ILF solo w 24/7 care attendant, Since Dec could get up from bed by herself and get to seat on rollator with min guard A but was not walking  ADLs Comments: supervision from hired caregivers  PT Insurance underwriter for SNF coverage    OT      SLP      SW        Team Discharge Planning: Destination: PT-Home (ILF with hired assistance) ,OT- Home , SLP-Assisted Living Projected Follow-up: PT-Home health PT, OT-  Home health OT, SLP-Home Health SLP Projected Equipment Needs: PT-To be determined, OT- To be determined, SLP-None recommended by SLP Equipment Details: PT-Owns  rollator, OT-  Patient/family involved in discharge planning: PT- Patient, Family member/caregiver,  OT-Patient, SLP-Patient  MD ELOS: 12-14 days Medical Rehab Prognosis:  Good Assessment: The patient has been admitted for CIR therapies with the diagnosis of R MCA stroke. The team will be addressing functional mobility, strength, stamina, balance, safety, adaptive techniques and equipment, self-care, bowel and bladder mgt, patient and caregiver education. Goals have been set at [facility] assist to supervision for PT/OT/SLP. Anticipated discharge destination is home.       See Team Conference Notes for weekly updates to the plan of care

## 2022-04-23 NOTE — Progress Notes (Signed)
Occupational Therapy Session Note  Patient Details  Name: [name] MRN: [id number] Date of Birth: Aug 27, 1925  Today's Date: 04/23/2022 OT Individual Time: [id number] Individual Time Calculation (min): 70 min    Short Term Goals: Week 1:  OT Short Term Goal 1 (Week 1): Pt will complete 2/3 steps of toileting OT Short Term Goal 2 (Week 1): Pt will thread BLE into pants with AE PRN OT Short Term Goal 3 (Week 1): Pt will don shirt with S OT Short Term Goal 4 (Week 1): Pt will groom in standing wiht CGA to demo improved activity tolerance  Skilled Therapeutic Interventions/Progress Updates:    Pt greeted seated in straight back chair with alarm belt on. Pt trying to get up to  reach her hearing aid that had fallen on the floor. Per nursing, patients other hearing aid fell last night and broke. OT shared this with family when they entered the room later in the session. Functional ambulation in room with RW and min A to collect patients purse and self-care items. Pt was already dressed for the day and declined washing up. Pt ambulated to the with with min cues and min A w. RW. Grooming tasks in standing with CGA for balance. Pt perseverting on finding her aspercream in her purse. Aspercream later found on counter top. PT requested to be weighed. OT brought in scale and pt stood with min A. Pt then brought to therapy gym in wc > OT issued tan theraputty and placed 10 neads inside. Wokred on attention and fine motor skills using putty. Pt had difficulty attending to task and often nodding off, requirring cues to stay awake and on task. Pt returned to room where family was waiting. Pt requested water and was given water in a cup. Alarm belt on, call bell in reach, and needs met.   Therapy Documentation Precautions:  Precautions Precautions: Fall Restrictions Weight Bearing Restrictions: No  Pain:  Denies pain   Therapy/Group: Individual Therapy  Valma Cava 04/23/2022, 12:51  PM

## 2022-04-23 NOTE — Progress Notes (Signed)
During rounds patient found still awake holding on to side rails, but not in distress or trying to get out of bed.

## 2022-04-23 NOTE — Progress Notes (Signed)
Patient attempted to get out of bed. Nurse and NT repositioned patient and reassured her. Patient resting in bed with call light in reach, bed alarm on, and bed in lowest setting.

## 2022-04-23 NOTE — Progress Notes (Signed)
Patient's hearing aid box found on floor beside bed with hearing aids on floor. One hearing aid found broken and one intact.

## 2022-04-24 DIAGNOSIS — I63511 Cerebral infarction due to unspecified occlusion or stenosis of right middle cerebral artery: Secondary | ICD-10-CM | POA: Diagnosis not present

## 2022-04-24 DIAGNOSIS — G4700 Insomnia, unspecified: Secondary | ICD-10-CM | POA: Diagnosis not present

## 2022-04-24 LAB — MAGNESIUM: Magnesium: 1.6 mg/dL — ABNORMAL LOW (ref 1.7–2.4)

## 2022-04-24 MED ORDER — ESZOPICLONE 1 MG PO TABS
3.0000 mg | ORAL_TABLET | Freq: Every day | ORAL | Status: DC
  Administered 2022-04-24: 3 mg via ORAL

## 2022-04-24 MED ORDER — TRAZODONE HCL 50 MG PO TABS: 25.0000 mg | ORAL_TABLET | Freq: Every evening | ORAL | Status: DC | PRN

## 2022-04-24 MED ORDER — ESZOPICLONE 2 MG PO TABS: 3.0000 mg | ORAL_TABLET | Freq: Every day | ORAL | Status: DC

## 2022-04-24 MED ORDER — ACETAMINOPHEN 325 MG PO TABS
650.0000 mg | ORAL_TABLET | Freq: Four times a day (QID) | ORAL | Status: DC
  Administered 2022-04-24 – 2022-04-28 (×12): 650 mg via ORAL
  Filled 2022-04-24 (×14): qty 2

## 2022-04-24 MED ORDER — MELATONIN 5 MG PO TABS
10.0000 mg | ORAL_TABLET | Freq: Every day | ORAL | Status: DC
  Administered 2022-04-24 – 2022-04-27 (×4): 10 mg via ORAL
  Filled 2022-04-24 (×4): qty 2

## 2022-04-24 MED ORDER — NON FORMULARY: 3.0000 mg | Freq: Every day | Status: DC

## 2022-04-24 NOTE — Progress Notes (Signed)
PROGRESS NOTE   Subjective/Complaints:  Doing ok overnight, didn't sleep great. Family brought Lunesta today, order placed for this to be able to be given tonight.  Denies being in any pain this morning. Had a BM yesterday. Urinating well. Denies any other complaints at this time.   ROS: +Joint pain - R foot, L hand, now R calf--all improving, +insomnia - worse, +chronic diarrhea - improving Denies fevers, chills, CP, SOB, abd pain, N/V/C, new weakness or paraesthesias.    Objective:   VAS [place] LOWER EXTREMITY VENOUS (DVT)  Result Date: 04/23/2022  Lower Venous DVT Study Patient Name:  [name]  Date of Exam:   04/23/2022 Medical Rec #: [id number]                  Accession #:    [id number] Date of Birth: 1925-10-25                   Patient Gender: F Patient Age:   89 years Exam Location:  [facility] Procedure:      VAS [place] LOWER EXTREMITY VENOUS (DVT) Referring Phys: Durel Salts --------------------------------------------------------------------------------  Indications: Right calf pain. Other             04-16-2022 IR percutaneous thrombectomy with right groin Indications:      catheter. Limitations: Patient pain. Comparison Study: No prior studies. Performing Technologist: Darlin Coco RDMS, RVT  Examination Guidelines: A complete evaluation includes B-mode imaging, spectral Doppler, color Doppler, and power Doppler as needed of all accessible portions of each vessel. Bilateral testing is considered an integral part of a complete examination. Limited examinations for reoccurring indications may be performed as noted. The reflux portion of the exam is performed with the patient in reverse Trendelenburg.  +---------+---------------+---------+-----------+----------+--------------+ RIGHT    CompressibilityPhasicitySpontaneityPropertiesThrombus Aging  +---------+---------------+---------+-----------+----------+--------------+ CFV      Full           Yes      Yes                                 +---------+---------------+---------+-----------+----------+--------------+ SFJ      Full                                                        +---------+---------------+---------+-----------+----------+--------------+ FV Prox  Full                                                        +---------+---------------+---------+-----------+----------+--------------+ FV Mid   Full                                                        +---------+---------------+---------+-----------+----------+--------------+  FV DistalFull                                                        +---------+---------------+---------+-----------+----------+--------------+ PFV      Full                                                        +---------+---------------+---------+-----------+----------+--------------+ POP      Full           Yes      Yes                                 +---------+---------------+---------+-----------+----------+--------------+ PTV      Full                                                        +---------+---------------+---------+-----------+----------+--------------+ PERO     Full                                                        +---------+---------------+---------+-----------+----------+--------------+ Gastroc  Full                                                        +---------+---------------+---------+-----------+----------+--------------+   +----+---------------+---------+-----------+----------+--------------+ LEFTCompressibilityPhasicitySpontaneityPropertiesThrombus Aging +----+---------------+---------+-----------+----------+--------------+ CFV Full           Yes      Yes                                  +----+---------------+---------+-----------+----------+--------------+     Summary: RIGHT: - There is no evidence of deep vein thrombosis in the lower extremity.  - No cystic structure found in the popliteal fossa.  - Incidental: Collection at the right groin superficial to the right common femoral artery. No flow visualized internally. Given recent catheterization, may represent hematoma/thrombosed pseudoaneurysm.   *See table(s) above for measurements and observations. Electronically signed by Harold Barban MD on 04/23/2022 at 9:24:03 PM.    Final    No results for input(s): "WBC", "HGB", "HCT", "PLT" in the last 72 hours.  Recent Labs    04/23/22 0540  NA 134*  K 3.7  CL 99  CO2 23  GLUCOSE 102*  BUN 13  CREATININE 0.91  CALCIUM 9.3     Intake/Output Summary (Last 24 hours) at 04/24/2022 0824 Last data filed at 04/24/2022 0815 Gross per 24 hour  Intake 597 ml  Output 800 ml  Net -203 ml         Physical Exam: Vital Signs Blood pressure (!) 139/90, pulse Marland Kitchen)  107, temperature 97.8 F (36.6 C), resp. rate 16, height 4' 11"$  (1.499 m), weight 64.5 kg, SpO2 98 %. Constitutional: No apparent distress. Appropriate appearance for age.  HENT: Trachea midline. Atraumatic, normocephalic. +HOH, without b/l hearing aides in Eyes: PERRLA. EOMI. Visual fields grossly intact.  Cardiovascular: irregularly irregular, no murmurs/rub/gallops. 1+ BLE edema. Peripheral pulses 2+  Respiratory: CTAB. No rales, rhonchi, or wheezing. On RA.  Abdomen: + bowel sounds, normoactive. No distention or tenderness.  Skin: Mild ecchymosis on R hand. Otherwise c/d/I.   MSK: + TTP R calf squeeze-not reassessed today      No apparent deformity. Antigravity and against resistance in all 4 extremities; BL LE weakness 4/5 L>R   Neuro: AAOx3.  follows simple motor commands. + Mild left facial droop + mild dysarthria, Decreased ROM right foot. Decreased to light touch sensation on left side + L mild hemineglect  -  not appreciated on exam 2/10   Assessment/Plan: 1. Functional deficits which require 3+ hours per day of interdisciplinary therapy in a comprehensive inpatient rehab setting. Physiatrist is providing close team supervision and 24 hour management of active medical problems listed below. Physiatrist and rehab team continue to assess barriers to discharge/monitor patient progress toward functional and medical goals  Care Tool:  Bathing    Body parts bathed by patient: Right arm, Left arm, Chest, Abdomen, Front perineal area, Right upper leg, Left upper leg, Face   Body parts bathed by helper: Buttocks, Right lower leg, Left lower leg     Bathing assist Assist Level: Moderate Assistance - Patient 50 - 74%     Upper Body Dressing/Undressing Upper body dressing   What is the patient wearing?: Pull over shirt    Upper body assist Assist Level: Minimal Assistance - Patient > 75%    Lower Body Dressing/Undressing Lower body dressing      What is the patient wearing?: Pants     Lower body assist Assist for lower body dressing: Maximal Assistance - Patient 25 - 49%     Toileting Toileting    Toileting assist Assist for toileting: Maximal Assistance - Patient 25 - 49%     Transfers Chair/bed transfer  Transfers assist     Chair/bed transfer assist level: Moderate Assistance - Patient 50 - 74%     Locomotion Ambulation   Ambulation assist      Assist level: Moderate Assistance - Patient 50 - 74% Assistive device: Walker-rolling Max distance: 50'   Walk 10 feet activity   Assist     Assist level: Moderate Assistance - Patient - 50 - 74% Assistive device: Walker-rolling   Walk 50 feet activity   Assist    Assist level: Moderate Assistance - Patient - 50 - 74% Assistive device: Walker-rolling    Walk 150 feet activity   Assist Walk 150 feet activity did not occur: Safety/medical concerns         Walk 10 feet on uneven surface   activity   Assist     Assist level: Moderate Assistance - Patient - 50 - 74% Assistive device: Chemical engineer     Assist Is the patient using a wheelchair?: Yes (per therapist documentation, dependent w/c use for energy conservation) Type of Wheelchair: Manual    Wheelchair assist level: Dependent - Patient 0% Max wheelchair distance: Therapist propelled manual wheelchair 150' for energy conservation as patient presents with poor endurance/activity tolerance    Wheelchair 50 feet with 2 turns activity    Assist  Assist Level: Dependent - Patient 0%   Wheelchair 150 feet activity     Assist      Assist Level: Dependent - Patient 0%   Blood pressure (!) 139/90, pulse (!) 107, temperature 97.8 F (36.6 C), resp. rate 16, height 4' 11"$  (1.499 m), weight 64.5 kg, SpO2 98 %.  Medical Problem List and Plan: 1. 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  Antithrombotics: -DVT/anticoagulation:  Pharmaceutical: Eliquis 38m BID--resumed 12/05             -antiplatelet therapy: N/A 3. Pain Management:  Tylenol [id number]   - 2/7: voltaren gel QID to R foot and L hand -04/24/22 family brought aspercreme, requesting tylenol scheduled, ordered 65030m6h  4. Mood/Behavior/Sleep:  LCSW to follow for evaluation and support.              -antipsychotic agents: N/A             --sleep wake chart. Will add melatonin 10 mg/hs -04/24/22 ordered melatonin 56m79mnce it was still ordered as 5mg;8mded Trazodone 25mg 50mPRN; family brought Lunesta from home, order placed for this to be allowed to be given (3mg)-m38mtor 5. Neuropsych/cognition: This patient is not capable of making decisions on his  own behalf. --had mild cognitive impairments at baseline. Will add delirum precautions - had 24/7 caregivers  at home  6. Skin/Wound Care: Routine pressure relief measures 7. Fluids/Electrolytes/Nutrition: Monitor I/O. Monitor weekly labs, next 04/26/22 -Mg and K low on intake labs; replete 40 meq BID K; was already on Mag 400 mg dial (200 mg BID); d/t diarrhea will try IV repletion today and repeat labs 2/9 - Mg 1.7 today. Increase PO mag to 400 mg BID d/t repeated lows/borderline low, repeat in AM labs 2/9  - 2/9: repeat stable 1.7; continue and repeat Monday -04/24/22 Mg 1.6, but fairly stable, cont MagOx 400mg BI18monitor next on 04/26/22 (see #14 below)  8. Chronic diastolic HF/global hypokinesis: EF-45-50% with severe concentric LVH --Monitor weights daily and for signs of overload --Lasix 20mg BID26mtoprolol 12.5mg q8h, 57mitor 80mg QD -242mLE edema stable; monitor weights -2/9: RLE > LLE edema; Duplex ordered RLE; discussed w/ nursing obtaining daily weights -04/24/22 no weight done today, ordered for daily weights, monitor. LE swelling seems better; Duplex U/S negative for DVT, showing likely hematoma superficial to R common femoral artery Filed Weights   04/20/22 1826 04/23/22 1211  Weight: 64.6 kg 64.5 kg    9. Covid 11/23/Hypoxia: At baseline had SOB/DOE w/activity--few steps -- Add flutter valve and encourage pulmonary hygiene.  --has been requiring NRB for activity  10. A fib: Monitor HR TID--on metoprolol and eliquis 11.  HTN: Monitor BP TID--on Metoprolol, Clonidine, norvasc  - 2/9: Increase norvasc from 2.5 mg BID to 5 mg bid  -04/24/22 BPs stabilizing, cont regimen, monitor Vitals:   04/21/22 1458 04/21/22 1958 04/22/22 0455 04/22/22 1227  BP: (!) 146/101 (!) 154/99 (!) 153/82 135/65   04/22/22 1229 04/22/22 1956 04/23/22 0531 04/23/22 0531  BP: 135/65 129/76 (!) 155/86 (!) 155/86   04/23/22 1321 04/23/22 1949 04/24/22 0437 04/24/22 1424  BP: 131/66 134/89 (!) 139/90 128/80      12. Acute on  chronic renal failure:  - Cr stable 1.0; monitor on weekly labs starting  04/26/22  13.  Persistent hypokalemia: Supplement today-- increased to 40 meq BID 2/7--> improved to 3.7; repeat Monday 04/26/22  14. Hypomagnesemia: Mg- 1.6 --will supplement today - increased to 400 mg daily + IV --> 400 mg BID 2/8; stable 1.7, continue and repeat Monday -04/24/22 Mg 1.6, relatively stable, cont MagOx and f/up with labs [facility] 04/26/22  15. Acute on chronic anemia/Groin hematoma: Hgb 11.1--->9.3             --has had melanotic stools past month and referred to Dr. Benson [place]   - On iron supplement  - HgB stable, 9.9 on 04/21/22, f/up on weekly labs 04/26/22  16. Left breast cancer: Has declined surgery and stop Fulvestrant due to SE.  17. H/o chronic Diarrhea: Liquid stools documented-->d/c colace and miralax.  --PTA felt to be anxiety related.  - Switch iron to every other day for improved absorption, decreased s/e - Tolerating PO magnesium - Consider immodium if worsening  - LBM 2/8, mushy  18. Chronic insomnia: Used Lunesta 3 mg followed by Melatonin 10 mg every night per son.    - 2/8: Start Melatonin 5 mg + Ambien 5 mg QHS PRN per formulary - 2/9: Confusion overnight and this AM, poor sleep, ?d/t ambien. Will DC medication, discuss w/ son today bringing in [place] since not on formulary -04/24/22 increased melatonin to 50m QHS as above, Lunesta brought in by family, ordered so this can be given. Monitor  19. Anxiety/depression: Back on Wellbutrin 304mQD with buspar 68m110mid prn.   20. Urinary retention?: Will order PVR/bladder scans.- hadn't voided this AM  - PVRs not performed; incontinent intermittent voids, will wait for PVRs before treating - 2/7, 2/8 - 1x PVR 244 on 2/9; mildly elevated, will hold off on starting medications for urgency    LOS: 4 days A FACE TO FACSchubert10/2024, 8:24 AM

## 2022-04-24 NOTE — Progress Notes (Signed)
Occupational Therapy Session Note  Patient Details  Name: [name] MRN: [id number] Date of Birth: 01/16/1926  Today's Date: 04/24/2022 OT Individual Time: [id number] OT Individual Time Calculation (min): 55 min    Short Term Goals: Week 1:  OT Short Term Goal 1 (Week 1): Pt will complete 2/3 steps of toileting OT Short Term Goal 2 (Week 1): Pt will thread BLE into pants with AE PRN OT Short Term Goal 3 (Week 1): Pt will don shirt with S OT Short Term Goal 4 (Week 1): Pt will groom in standing wiht CGA to demo improved activity tolerance  Skilled Therapeutic Interventions/Progress Updates:  Skilled OT intervention completed with focus on visual scanning and attention tasks, dynamic balance. Pt received seated in w/c already ready for the day with assist from NT, pt agreeable to session. No pain reported.  Donned shoes with mod A.Transported in w/c <> gym. Seated at [facility] pt completed the following assessments: -Bells cancellation- 13:48 to complete, 14 misses largely on R side with some in the superior field also on L side -Speed dot reaction test: 1st trial- >5 sec reaction time, 2nd trial- 4.28 sec -Visual pursuit- 84%, 2.74 sec reaction time, 27 hits Generally pt demos R inattention and delayed reaction speeds in all tasks, requiring redirection to activity however when verbally and physically cued, pt is able to improve her attention to R side  Transitioned to standing and dynamic balance task at window with min A needed to power up without AD, cues for anterior weight shifting and bringing BUE forward. Able to maintain stance with intermittent UE support on window seal however no AD with initial min A fading to supervision with cues for anterior weight shift. Some difficulty removing squigz from window due to poor grasping strength/arthritis at baseline per report, however able to remove and then place all in stance with squigz in all areas of vision.  Back in room, pt  remained seated in w/c, with belt alarm on/activated, and with all needs in reach at end of session.   Therapy Documentation Precautions:  Precautions Precautions: Fall Restrictions Weight Bearing Restrictions: No    Therapy/Group: Individual Therapy  Blase Mess, MS, OTR/L  04/24/2022, 12:20 PM

## 2022-04-24 NOTE — Progress Notes (Signed)
Patient restless during the night trying to get out the bed. Repositioned several times. Pt had few naps but was awake most of the time.

## 2022-04-24 NOTE — Plan of Care (Signed)
  Problem: RH BLADDER ELIMINATION Goal: RH STG MANAGE BLADDER WITH ASSISTANCE Description: STG Manage Bladder With mod I  Assistance Outcome: Not Progressing; incontinence

## 2022-04-25 DIAGNOSIS — I63511 Cerebral infarction due to unspecified occlusion or stenosis of right middle cerebral artery: Secondary | ICD-10-CM | POA: Diagnosis not present

## 2022-04-25 DIAGNOSIS — G4700 Insomnia, unspecified: Secondary | ICD-10-CM | POA: Diagnosis not present

## 2022-04-25 MED ORDER — ESZOPICLONE 1 MG PO TABS
1.5000 mg | ORAL_TABLET | Freq: Every day | ORAL | Status: DC
  Administered 2022-04-25 – 2022-04-27 (×3): 1.5 mg via ORAL
  Filled 2022-04-25 (×3): qty 2

## 2022-04-25 NOTE — Plan of Care (Signed)
  Problem: RH BLADDER ELIMINATION Goal: RH STG MANAGE BLADDER WITH ASSISTANCE Description: STG Manage Bladder With mod I  Assistance Outcome: Not Progressing; incontinence ; purewick

## 2022-04-25 NOTE — Progress Notes (Addendum)
Patient very sleepy ; arousable but  irritable today. Patient says to leave her alone .V/s WNL; Pt was started on sleep medication last night. PA notified

## 2022-04-25 NOTE — Progress Notes (Signed)
SLP Cancellation Note  Patient Details Name: [name] MRN: [id number] DOB: 05-03-1925   Cancelled treatment:        SLP attempted to see pt for speech therapy session today. Pt unable to participate due to significant lethargy and pain. Daughter at bedside felt pt was unable to participate in therapy at this time and SLP in agreement.  Pt missed 60 minutes of scheduled therapy session.                                                                                                 Wyn Forster 04/25/2022, 3:03 PM

## 2022-04-25 NOTE — Progress Notes (Signed)
PROGRESS NOTE   Subjective/Complaints:  Pt very sleepy this morning and somewhat irritable. When asked if anything hurts, she shakes her head no, but she yells "you're bothering me" and then refuses to answer any other questions. Speech clear when she does speak.  Per nursing, she was very somnolent this morning and refused therapy.   ROS: Limited due to uncooperativeness Prior ROS: +Joint pain - R foot, L hand, now R calf, +insomnia, +chronic diarrhea - improving Denies pain but remainder of ROS limited    Objective:   VAS [place] LOWER EXTREMITY VENOUS (DVT)  Result Date: 04/23/2022  Lower Venous DVT Study Patient Name:  [name]  Date of Exam:   04/23/2022 Medical Rec #: [id number]                  Accession #:    [id number] Date of Birth: 1925-11-04                   Patient Gender: F Patient Age:   89 years Exam Location:  [facility] Procedure:      VAS [place] LOWER EXTREMITY VENOUS (DVT) Referring Phys: Durel Salts --------------------------------------------------------------------------------  Indications: Right calf pain. Other             04-16-2022 IR percutaneous thrombectomy with right groin Indications:      catheter. Limitations: Patient pain. Comparison Study: No prior studies. Performing Technologist: Darlin Coco RDMS, RVT  Examination Guidelines: A complete evaluation includes B-mode imaging, spectral Doppler, color Doppler, and power Doppler as needed of all accessible portions of each vessel. Bilateral testing is considered an integral part of a complete examination. Limited examinations for reoccurring indications may be performed as noted. The reflux portion of the exam is performed with the patient in reverse Trendelenburg.  +---------+---------------+---------+-----------+----------+--------------+ RIGHT    CompressibilityPhasicitySpontaneityPropertiesThrombus Aging  +---------+---------------+---------+-----------+----------+--------------+ CFV      Full           Yes      Yes                                 +---------+---------------+---------+-----------+----------+--------------+ SFJ      Full                                                        +---------+---------------+---------+-----------+----------+--------------+ FV Prox  Full                                                        +---------+---------------+---------+-----------+----------+--------------+ FV Mid   Full                                                        +---------+---------------+---------+-----------+----------+--------------+  FV DistalFull                                                        +---------+---------------+---------+-----------+----------+--------------+ PFV      Full                                                        +---------+---------------+---------+-----------+----------+--------------+ POP      Full           Yes      Yes                                 +---------+---------------+---------+-----------+----------+--------------+ PTV      Full                                                        +---------+---------------+---------+-----------+----------+--------------+ PERO     Full                                                        +---------+---------------+---------+-----------+----------+--------------+ Gastroc  Full                                                        +---------+---------------+---------+-----------+----------+--------------+   +----+---------------+---------+-----------+----------+--------------+ LEFTCompressibilityPhasicitySpontaneityPropertiesThrombus Aging +----+---------------+---------+-----------+----------+--------------+ CFV Full           Yes      Yes                                  +----+---------------+---------+-----------+----------+--------------+     Summary: RIGHT: - There is no evidence of deep vein thrombosis in the lower extremity.  - No cystic structure found in the popliteal fossa.  - Incidental: Collection at the right groin superficial to the right common femoral artery. No flow visualized internally. Given recent catheterization, may represent hematoma/thrombosed pseudoaneurysm.   *See table(s) above for measurements and observations. Electronically signed by Harold Barban MD on 04/23/2022 at 9:24:03 PM.    Final    No results for input(s): "WBC", "HGB", "HCT", "PLT" in the last 72 hours.  Recent Labs    04/23/22 0540  NA 134*  K 3.7  CL 99  CO2 23  GLUCOSE 102*  BUN 13  CREATININE 0.91  CALCIUM 9.3     Intake/Output Summary (Last 24 hours) at 04/25/2022 0721 Last data filed at 04/25/2022 0600 Gross per 24 hour  Intake 774 ml  Output 800 ml  Net -26 ml         Physical Exam: Vital Signs Blood pressure 133/70, pulse 77, temperature  98.1 F (36.7 C), temperature source Oral, resp. rate 16, height 4' 11"$  (1.499 m), weight 60.5 kg, SpO2 98 %.  Constitutional: No apparent distress, laying in bed, irritable. Appropriate appearance for age.  HENT: Trachea midline. Atraumatic, normocephalic. +HOH, b/l hearing aides in, no worsening facial droop appreciated Eyes: PERRLA. EOMI. No obvious nystagmus but pt uncooperative.  Cardiovascular: irregularly irregular, no murmurs/rub/gallops. 1+ BLE edema. Peripheral pulses 2+  Respiratory: CTAB. No rales, rhonchi, or wheezing. On RA.  Abdomen: + bowel sounds, normoactive. No distention or apparent tenderness.  Skin: Mild ecchymosis on R hand. Otherwise c/d/I.  Neuro: irritable, laying in bed mostly with eyes closed but opens eyes and yells at provider, speech clear; uncooperative. Doesn't answer questions.   Prior exam: MSK: + TTP R calf squeeze-not reassessed today      No apparent deformity. Antigravity and  against resistance in all 4 extremities; BL LE weakness 4/5 L>R   Neuro: AAOx3.  follows simple motor commands. + Mild left facial droop + mild dysarthria, Decreased ROM right foot. Decreased to light touch sensation on left side + L mild hemineglect  - not appreciated on exam 2/10   Assessment/Plan: 1. Functional deficits which require 3+ hours per day of interdisciplinary therapy in a comprehensive inpatient rehab setting. Physiatrist is providing close team supervision and 24 hour management of active medical problems listed below. Physiatrist and rehab team continue to assess barriers to discharge/monitor patient progress toward functional and medical goals  Care Tool:  Bathing    Body parts bathed by patient: Right arm, Left arm, Chest, Abdomen, Front perineal area, Right upper leg, Left upper leg, Face   Body parts bathed by helper: Buttocks, Right lower leg, Left lower leg     Bathing assist Assist Level: Moderate Assistance - Patient 50 - 74%     Upper Body Dressing/Undressing Upper body dressing   What is the patient wearing?: Pull over shirt    Upper body assist Assist Level: Minimal Assistance - Patient > 75%    Lower Body Dressing/Undressing Lower body dressing      What is the patient wearing?: Pants     Lower body assist Assist for lower body dressing: Maximal Assistance - Patient 25 - 49%     Toileting Toileting    Toileting assist Assist for toileting: Maximal Assistance - Patient 25 - 49%     Transfers Chair/bed transfer  Transfers assist     Chair/bed transfer assist level: Moderate Assistance - Patient 50 - 74%     Locomotion Ambulation   Ambulation assist      Assist level: Moderate Assistance - Patient 50 - 74% Assistive device: Walker-rolling Max distance: 50'   Walk 10 feet activity   Assist     Assist level: Moderate Assistance - Patient - 50 - 74% Assistive device: Walker-rolling   Walk 50 feet activity   Assist     Assist level: Moderate Assistance - Patient - 50 - 74% Assistive device: Walker-rolling    Walk 150 feet activity   Assist Walk 150 feet activity did not occur: Safety/medical concerns         Walk 10 feet on uneven surface  activity   Assist     Assist level: Moderate Assistance - Patient - 50 - 74% Assistive device: Walker-rolling   Wheelchair     Assist Is the patient using a wheelchair?: Yes (per therapist documentation, dependent w/c use for energy conservation) Type of Wheelchair: Manual    Wheelchair assist level:  Dependent - Patient 0% Max wheelchair distance: Therapist propelled manual wheelchair 150' for energy conservation as patient presents with poor endurance/activity tolerance    Wheelchair 50 feet with 2 turns activity    Assist        Assist Level: Dependent - Patient 0%   Wheelchair 150 feet activity     Assist      Assist Level: Dependent - Patient 0%   Blood pressure 133/70, pulse 77, temperature 98.1 F (36.7 C), temperature source Oral, resp. rate 16, height 4' 11"$  (1.499 m), weight 60.5 kg, SpO2 98 %.  Medical Problem List and Plan: 1. 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  Antithrombotics: -DVT/anticoagulation:  Pharmaceutical: Eliquis 38m BID--resumed 12/05             -antiplatelet therapy: N/A 3. Pain Management:  Tylenol 6513mq6h   - 2/7: voltaren gel QID to R foot and L hand -04/24/22 family brought aspercreme, requesting tylenol scheduled, ordered 65021m6h  4. Mood/Behavior/Sleep:  LCSW to follow for evaluation and support.              -antipsychotic agents: N/A -see below #18  5. Neuropsych/cognition: This patient is not capable of making decisions on his  own behalf. --had mild cognitive impairments at baseline. Will add delirum precautions -  had 24/7 caregivers at home  6. Skin/Wound Care: Routine pressure relief measures 7. Fluids/Electrolytes/Nutrition: Monitor I/O. Monitor weekly labs, next 04/26/22 -Mg and K low on intake labs; replete 40 meq BID K; was already on Mag 400 mg dial (200 mg BID); d/t diarrhea will try IV repletion today and repeat labs 2/9 - Mg 1.7 today. Increase PO mag to 400 mg BID d/t repeated lows/borderline low, repeat in AM labs 2/9  - 2/9: repeat stable 1.7; continue and repeat Monday -04/24/22 Mg 1.6, but fairly stable, cont MagOx 400m69mD, monitor next on 04/26/22 (see #14 below)  8. Chronic diastolic HF/global hypokinesis: EF-45-50% with severe concentric LVH --Monitor weights daily and for signs of overload --Lasix 20mg15m, metoprolol 12.5mg q20m Lipitor 80mg Q68m/7: LE edema stable; monitor weights -2/9: RLE > LLE edema; Duplex ordered RLE; discussed w/ nursing obtaining daily weights -04/24/22 no weight done today, ordered for daily weights, monitor. LE swelling seems better; Duplex U/S negative for DVT, showing likely hematoma superficial to R common femoral artery -04/25/22 wt down, cont to trend Filed Weights   04/20/22 1826 04/23/22 1211 04/25/22 0600  Weight: 64.6 kg 64.5 kg 60.5 kg    9. Covid 11/23/Hypoxia: At baseline had SOB/DOE w/activity--few steps -- Add flutter valve and encourage pulmonary hygiene.  --has been requiring NRB for activity  10. A fib: Monitor HR TID--on metoprolol and eliquis 11.  HTN: Monitor BP TID--on Metoprolol, Clonidine, norvasc  - 2/9: Increase norvasc from 2.5 mg BID to 5 mg bid  -04/25/22 BPs stabilizing, cont regimen, monitor Vitals:   04/22/22 0455 04/22/22 1227 04/22/22 1229 04/22/22 1956  BP: (!) 153/82 135/65 135/65 129/76   04/23/22 0531 04/23/22 0531 04/23/22 1321 04/23/22 1949  BP: (!) 155/86 (!) 155/86 131/66 134/89   04/24/22 0437 04/24/22 1424 04/24/22 1919 04/25/22 0600  BP: (!) 139/90 128/80 (!) 116/102 133/70  12. Acute on chronic  renal failure:  - Cr stable 1.0; monitor on weekly labs starting 04/26/22  13.  Persistent hypokalemia: Supplement today-- increased to 40 meq BID 2/7--> improved to 3.7; repeat Monday 04/26/22  14. Hypomagnesemia: Mg- 1.6 --will supplement today - increased to 400 mg daily + IV --> 400 mg BID 2/8; stable 1.7, continue and repeat Monday -04/24/22 Mg 1.6, relatively stable, cont MagOx and f/up with labs [facility] 04/26/22  15. Acute on chronic anemia/Groin hematoma: Hgb 11.1--->9.3             --has had melanotic stools past month and referred to Dr. Benson [place]   - On iron supplement  - HgB stable, 9.9 on 04/21/22, f/up on weekly labs 04/26/22  16. Left breast cancer: Has declined surgery and stop Fulvestrant due to SE.  17. H/o chronic Diarrhea: Liquid stools documented-->d/c colace and miralax.  --PTA felt to be anxiety related.  - Switch iron to every other day for improved absorption, decreased s/e - Tolerating PO magnesium - Consider immodium if worsening  - LBM 2/10, soft, cont regimen  18. Chronic insomnia: Used Lunesta 3 mg followed by Melatonin 10 mg every night per son.   -sleep wake chart. Will add melatonin 10 mg/hs  - 2/8: Start Melatonin 5 mg + Ambien 5 mg QHS PRN per formulary - 2/9: Confusion overnight and this AM, poor sleep, ?d/t ambien. Will DC medication, discuss w/ son today bringing in [place] since not on formulary -04/24/22 ordered melatonin 29m since it was still ordered as 530m added Trazodone 2573mHS PRN; family brought Lunesta from home, order placed for this to be allowed to be given (3mg74monitor -04/25/22 pt sleepy this morning, very irritable, appears similar to what happened with Ambien the other day-- suspect Lunesta at play-- decreased to 1.5mg 72m. Monitor closely, may need further adjustments to mood/sleep meds--no acute neuro deficits appreciated, doubt acute stroke or need for emergent work up today  19. Anxiety/depression: Back on Wellbutrin 300mg 22mith buspar 5mg  t68mprn.   20. Urinary retention?: Will order PVR/bladder scans.- hadn't voided this AM  - PVRs not performed; incontinent intermittent voids, will wait for PVRs before treating - 2/7, 2/8 - 1x PVR 244 on 2/9; mildly elevated, will hold off on starting medications for urgency -04/25/22 per nursing, pt now on purewick, but sleepy this morning-- monitor for now, defer to weekday team regarding next steps    LOS: 5 days A FACE TO FACE [id number], 7:21 AM

## 2022-04-25 NOTE — Progress Notes (Signed)
Physical Therapy Session Note  Patient Details  Name: [name] MRN: [id number] Date of Birth: January 02, 1926  Today's Date: 04/25/2022      Short Term Goals: Week 1:  PT Short Term Goal 1 (Week 1): Patient will perform bed mobility with MinA and LRAD PT Short Term Goal 2 (Week 1): Patient will ambulate 17' with LRAD and MinA PT Short Term Goal 3 (Week 1): Patient will perform transfers with LRAD and MinA  Skilled Therapeutic Interventions/Progress Updates: Pt presented in bed with fmaily present sleeping. Per family as been unable to arouse all morning. PTA attempted to use wet washcloth, sternal rub and turning on overhead light. Pt would rouse then quickly fall back asleep. When blanket removed pt was able to reach forward and attempt to swat this therapist. Explained to family to attempt to keep lights on and work on [place] pt. Pt missed 75 min skilled PT due to lethargy. Will continue efforts as schedule permits.      Therapy Documentation Precautions:  Precautions Precautions: Fall Restrictions Weight Bearing Restrictions: No General: PT Amount of Missed Time (min): 75 Minutes PT Missed Treatment Reason: Increased agitation;Patient unwilling to participate;Patient fatigue Vital Signs: Therapy Vitals Temp: 98.5 F (36.9 C) Pulse Rate: 68 Resp: 17 BP: 101/61 Patient Position (if appropriate): Lying Oxygen Therapy SpO2: 96 % O2 Device: Room Air Pain:   Mobility:   Locomotion :    Trunk/Postural Assessment :    Balance:   Exercises:   Other Treatments:      Therapy/Group: Individual Therapy  Dartanyon Frankowski 04/25/2022, 3:41 PM

## 2022-04-25 NOTE — Progress Notes (Signed)
Occupational Therapy Session Note  Patient Details  Name: Camron Doland MRN: [id number] Date of Birth: 03-31-1925  Today's Date: 04/25/2022 OT Missed Time: 75 Minutes Missed Time Reason: Patient fatigue;Other (comment) (significant lethargy)   Patient asleep in bed, therapist unable to arouse to wake despite increasing lighting, repositioning upright in bed, and gentle sternal rub and verbal cueing.  Notified nurse, who reports drowsiness may be secondary to medication side effects.   Therapist returned about an hour later for second attempt, however patient continued with increase lethargy despite attempts to awaken and nurse reports PA has been notified.    Therefore 75 minutes of missed treatment time due to lethargy and safety concerns.     Therapy Documentation Precautions:  Precautions Precautions: Fall Restrictions Weight Bearing Restrictions: No    Therapy/Group: Individual Therapy  Ezekiel Slocumb 04/25/2022, 7:56 AM

## 2022-04-25 NOTE — Progress Notes (Addendum)
Cooperative,disoriented and pleasant,  Resting without noted distress or discomfort, respiration even and unlabored on room air. Continue sleep chart per ordered, Purewick applied during shift, Safety measures in place   0630 Patient aroused for VS and weight, uncooperative in taking her meds after numerous attempts po, Patient remains on sleep chart results 10/12 hrs w/o noted distress or discomfort

## 2022-04-26 ENCOUNTER — Ambulatory Visit: Payer: Medicare Other | Admitting: Cardiology

## 2022-04-26 DIAGNOSIS — I63511 Cerebral infarction due to unspecified occlusion or stenosis of right middle cerebral artery: Secondary | ICD-10-CM | POA: Diagnosis not present

## 2022-04-26 LAB — CBC
HCT: 33.8 % — ABNORMAL LOW (ref 36.0–46.0)
Hemoglobin: 10.8 g/dL — ABNORMAL LOW (ref 12.0–15.0)
MCH: 27.2 pg (ref 26.0–34.0)
MCHC: 32 g/dL (ref 30.0–36.0)
MCV: 85.1 fL (ref 80.0–100.0)
Platelets: 365 10*3/uL (ref 150–400)
RBC: 3.97 MIL/uL (ref 3.87–5.11)
RDW: 20.1 % — ABNORMAL HIGH (ref 11.5–15.5)
WBC: 8 10*3/uL (ref 4.0–10.5)
nRBC: 0 % (ref 0.0–0.2)

## 2022-04-26 LAB — BASIC METABOLIC PANEL
Anion gap: 12 (ref 5–15)
BUN: 16 mg/dL (ref 8–23)
CO2: 21 mmol/L — ABNORMAL LOW (ref 22–32)
Calcium: 9.2 mg/dL (ref 8.9–10.3)
Chloride: 102 mmol/L (ref 98–111)
Creatinine, Ser: 1.35 mg/dL — ABNORMAL HIGH (ref 0.44–1.00)
GFR, Estimated: 36 mL/min — ABNORMAL LOW (ref >60)
Glucose, Bld: 222 mg/dL — ABNORMAL HIGH (ref 70–99)
Potassium: 3.7 mmol/L (ref 3.5–5.1)
Sodium: 135 mmol/L (ref 135–145)

## 2022-04-26 LAB — MAGNESIUM: Magnesium: 1.6 mg/dL — ABNORMAL LOW (ref 1.7–2.4)

## 2022-04-26 MED ORDER — GABAPENTIN 100 MG PO CAPS
100.0000 mg | ORAL_CAPSULE | Freq: Two times a day (BID) | ORAL | Status: DC
  Administered 2022-04-26 – 2022-04-28 (×4): 100 mg via ORAL
  Filled 2022-04-26 (×4): qty 1

## 2022-04-26 MED ORDER — FUROSEMIDE 20 MG PO TABS
20.0000 mg | ORAL_TABLET | Freq: Every day | ORAL | Status: DC
  Administered 2022-04-27 – 2022-04-28 (×2): 20 mg via ORAL
  Filled 2022-04-26 (×2): qty 1

## 2022-04-26 MED ORDER — POTASSIUM CHLORIDE CRYS ER 20 MEQ PO TBCR
40.0000 meq | EXTENDED_RELEASE_TABLET | Freq: Every day | ORAL | Status: DC
  Administered 2022-04-27 – 2022-04-28 (×2): 40 meq via ORAL
  Filled 2022-04-26 (×2): qty 2

## 2022-04-26 MED ORDER — MAGNESIUM SULFATE 2 GM/50ML IV SOLN
2.0000 g | Freq: Once | INTRAVENOUS | Status: AC
  Administered 2022-04-26: 2 g via INTRAVENOUS
  Filled 2022-04-26: qty 50

## 2022-04-26 MED ORDER — MAGNESIUM OXIDE -MG SUPPLEMENT 400 (240 MG) MG PO TABS
400.0000 mg | ORAL_TABLET | Freq: Two times a day (BID) | ORAL | Status: DC
  Administered 2022-04-27 – 2022-04-28 (×3): 400 mg via ORAL
  Filled 2022-04-26 (×3): qty 1

## 2022-04-26 MED ORDER — SODIUM CHLORIDE 0.9 % IV SOLN: INTRAVENOUS | Status: AC

## 2022-04-26 NOTE — Progress Notes (Signed)
PROGRESS NOTE   Subjective/Complaints:  No events overnight. Improved arrousal this AM. No complaints. States she is sleeping poorly despite home medications. Initially endorses no pain but on exam yells when palpating bilateral calves, endorsing they always hurt.   ROS: +Joint pain - b/l calf, +insomnia, +chronic diarrhea - improving Denies fevers, chills, N/V, abdominal pain, SOB, cough, chest pain, new weakness or paraesthesias.    Objective:   No results found. Recent Labs    04/26/22 0909  WBC 8.0  HGB 10.8*  HCT 33.8*  PLT 365    Recent Labs    04/26/22 0909  NA 135  K 3.7  CL 102  CO2 21*  GLUCOSE 222*  BUN 16  CREATININE 1.35*  CALCIUM 9.2     Intake/Output Summary (Last 24 hours) at 04/26/2022 1335 Last data filed at 04/26/2022 0848 Gross per 24 hour  Intake 480 ml  Output 1450 ml  Net -970 ml         Physical Exam: Vital Signs Blood pressure (!) 158/88, pulse (!) 54, temperature 98.8 F (37.1 C), temperature source Oral, resp. rate 18, height 4' 11"$  (1.499 m), weight 59.8 kg, SpO2 94 %.  Constitutional: No apparent distress, laying in bed, irritable. Appropriate appearance for age.  HENT: Trachea midline. Atraumatic, normocephalic. +HOH, b/l hearing aides in, no worsening facial droop appreciated Eyes: PERRLA. EOMI. No obvious nystagmus but pt uncooperative.  Cardiovascular: irregularly irregular, no murmurs/rub/gallops. 1+ BLE edema. Peripheral pulses 2+  Respiratory: CTAB. No rales, rhonchi, or wheezing. On RA.  Abdomen: + bowel sounds, normoactive. No distention or apparent tenderness.  Skin: Mild ecchymosis on R hand. Otherwise c/d/I.  MSK: + TTP b/l calf squeeze with minimal palpation Antigravity and against resistance in all 4 extremities   Neuro: AAO to self only. + Mild left facial droop + mild dysarthria - resolved Decreased to light touch sensation on left side - improving + L  mild hemineglect  - resolved   Assessment/Plan: 1. Functional deficits which require 3+ hours per day of interdisciplinary therapy in a comprehensive inpatient rehab setting. Physiatrist is providing close team supervision and 24 hour management of active medical problems listed below. Physiatrist and rehab team continue to assess barriers to discharge/monitor patient progress toward functional and medical goals  Care Tool:  Bathing    Body parts bathed by patient: Right arm, Left arm, Chest, Abdomen, Front perineal area, Right upper leg, Left upper leg, Face   Body parts bathed by helper: Buttocks, Right lower leg, Left lower leg     Bathing assist Assist Level: Moderate Assistance - Patient 50 - 74%     Upper Body Dressing/Undressing Upper body dressing   What is the patient wearing?: Pull over shirt    Upper body assist Assist Level: Contact Guard/Touching assist    Lower Body Dressing/Undressing Lower body dressing      What is the patient wearing?: Pants     Lower body assist Assist for lower body dressing: Maximal Assistance - Patient 25 - 49%     Toileting Toileting    Toileting assist Assist for toileting: Moderate Assistance - Patient 50 - 74%     Transfers Chair/bed  transfer  Transfers assist     Chair/bed transfer assist level: Moderate Assistance - Patient 50 - 74%     Locomotion Ambulation   Ambulation assist      Assist level: Moderate Assistance - Patient 50 - 74% Assistive device: Walker-rolling Max distance: 50'   Walk 10 feet activity   Assist     Assist level: Moderate Assistance - Patient - 50 - 74% Assistive device: Walker-rolling   Walk 50 feet activity   Assist    Assist level: Moderate Assistance - Patient - 50 - 74% Assistive device: Walker-rolling    Walk 150 feet activity   Assist Walk 150 feet activity did not occur: Safety/medical concerns         Walk 10 feet on uneven surface   activity   Assist     Assist level: Moderate Assistance - Patient - 50 - 74% Assistive device: Aeronautical engineer Is the patient using a wheelchair?: Yes (per therapist documentation, dependent w/c use for energy conservation) Type of Wheelchair: Manual    Wheelchair assist level: Dependent - Patient 0% Max wheelchair distance: Therapist propelled manual wheelchair 150' for energy conservation as patient presents with poor endurance/activity tolerance    Wheelchair 50 feet with 2 turns activity    Assist        Assist Level: Dependent - Patient 0%   Wheelchair 150 feet activity     Assist      Assist Level: Dependent - Patient 0%   Blood pressure (!) 158/88, pulse (!) 54, temperature 98.8 F (37.1 C), temperature source Oral, resp. rate 18, height 4' 11"$  (1.499 m), weight 59.8 kg, SpO2 94 %.  Medical Problem List and Plan: 1. 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  Antithrombotics: -DVT/anticoagulation:  Pharmaceutical: Eliquis 60m BID--resumed 12/05             -antiplatelet therapy: N/A 3. Pain Management:  Tylenol 6536mq6h   - 2/7: voltaren gel QID to R foot and L hand -04/24/22 family brought aspercreme, requesting tylenol scheduled, ordered 65086m6h  - 2/12: started gabapentin 100 mg BID for calf pain, ? D/t peripheral neuropathy  4. Mood/Behavior/Sleep:  LCSW to follow for evaluation and support.              -antipsychotic agents: N/A -see below #18  5. Neuropsych/cognition: This patient is not capable of making decisions on his  own behalf. --had mild cognitive impairments at baseline. Will add delirum precautions - had 24/7 caregivers at home  6. Skin/Wound Care: Routine pressure relief measures 7. Fluids/Electrolytes/Nutrition: Monitor I/O. Monitor weekly  labs, next 04/26/22 -Mg and K low on intake labs; replete 40 meq BID K; was already on Mag 400 mg dial (200 mg BID); d/t diarrhea will try IV repletion today and repeat labs 2/9 - Mg 1.7 today. Increase PO mag to 400 mg BID d/t repeated lows/borderline low, repeat in AM labs 2/9  - 2/9: repeat stable 1.7; continue and repeat Monday -04/24/22 Mg 1.6, but fairly stable, cont MagOx 400m17mD, monitor next on 04/26/22 (see #14 below) - 2/12: decreased K/Lasix to once a day. Mg IV for repletion. Repeat labs 2/13.  8. Chronic diastolic HF/global hypokinesis: EF-45-50% with severe concentric LVH --Monitor weights daily and for signs of overload --Lasix 57m BID, metoprolol 12.559mq8h, Lipitor 8026mD -2/7: LE edema stable; monitor weights -2/9: RLE > LLE edema; Duplex ordered RLE; discussed w/ nursing obtaining daily weights -04/24/22 no weight done today, ordered for daily weights, monitor. LE swelling seems better; Duplex U/S negative for DVT, showing likely hematoma superficial to R common femoral artery -04/25/22 wt down, cont to trend - 2/12: Decreasing lasix to once daily d/t aki; continue to trend Filed Weights   04/23/22 1211 04/25/22 0600 04/26/22 0550  Weight: 64.5 kg 60.5 kg 59.8 kg    9. Covid 11/23/Hypoxia: At baseline had SOB/DOE w/activity--few steps -- Add flutter valve and encourage pulmonary hygiene.  --has been requiring NRB for activity  10. A fib: Monitor HR TID--on metoprolol and eliquis 11.  HTN: Monitor BP TID--on Metoprolol, Clonidine, norvasc  - 2/9: Increase norvasc from 2.5 mg BID to 5 mg bid  -04/25/22 BPs stabilizing, cont regimen, monitor  2/12: ;labile SBP 100s to 160s; with DC lasix will add PRN hydralazine for SBL >170, DBP >110 Vitals:   04/23/22 0531 04/23/22 1321 04/23/22 1949 04/24/22 0437  BP: (!) 155/86 131/66 134/89 (!) 139/90   04/24/22 1424 04/24/22 1919 04/25/22 0600 04/25/22 0918  BP: 128/80 (!) 116/102 133/70 (!) 118/59   04/25/22 1259 04/25/22 1941  04/26/22 0541 04/26/22 0911  BP: 101/61 114/83 (!) 161/90 (!) 158/88      12. Acute on chronic renal failure:  - Cr stable 1.0; monitor on weekly labs starting 04/26/22 - Cr 1.3 today; decrease lasix, add gentle IVF 75/hr tonight and recheck in AM  13.  Persistent hypokalemia: Supplement today-- increased to 40 meq BID 2/7--> improved to 3.7; repeat Monday 04/26/22  - Decrease to once daily supplement with once daily lasix 2/12  14. Hypomagnesemia: Mg- 1.6 --will supplement today - increased to 400 mg daily + IV --> 400 mg BID 2/8; stable 1.7, continue and repeat Monday -04/24/22 Mg 1.6, relatively stable, cont MagOx and f/up with labs Mon 04/26/22 - 2/11: Mag still 1/6, IV repletion today, recheck in AM  15. Acute on chronic anemia/Groin hematoma: Hgb 11.1--->9.3 - stable             --has had melanotic stools past month and referred to Dr. HunBenson [place]- On iron supplement  16. Left breast cancer: Has declined surgery and stop Fulvestrant due to SE.  17. H/o chronic Diarrhea: Liquid stools documented-->d/c colace and miralax.  --PTA felt to be anxiety related.  - Switch iron to every other day for improved absorption, decreased s/e - Tolerating PO magnesium - Consider immodium if worsening  - LBM 2/10, soft, cont regimen  18. Chronic insomnia: Used Lunesta 3 mg followed by Melatonin 10 mg every night per son.   -sleep wake chart. Will add melatonin 10 mg/hs  - 2/8: Start Melatonin 5 mg + Ambien 5 mg QHS PRN per formulary - 2/9: Confusion overnight and this AM, poor sleep, ?d/t ambien. Will DC medication, discuss w/ son today [place] formulary -04/24/22 ordered melatonin 25m56mnce it was still ordered as 5mg;69mded Trazodone 25mg 19mPRN; family brought Lunesta from home, order placed for this to be allowed to be given (3mg)-m43mtor -04/25/22 pt sleepy this morning, very irritable, appears similar to what happened with Ambien the other day-- suspect Lunesta at play--  decreased to 1.5mg QHS60monitor closely, may  need further adjustments to mood/sleep meds--no acute neuro deficits appreciated, doubt acute stroke or need for emergent work up today 2/12: Continue Lunesta, improved AM arousal with lower dose. Start sleep log.   19. Anxiety/depression: Back on Wellbutrin 381m QD with buspar 573mtid prn.   20. Urinary retention?: Will order PVR/bladder scans.- hadn't voided this AM  - PVRs not performed; incontinent intermittent voids, will wait for PVRs before treating - 2/7, 2/8 - 1x PVR 244 on 2/9; mildly elevated, will hold off on starting medications for urgency -2/12: Purwick PRN, PVR remain low. Discuss starting ditropan with patient in AM; will hold off with IVF today    [place]23, 1:35 PM

## 2022-04-26 NOTE — Progress Notes (Incomplete)
Patient continue monitoring during shift after receiving medication but has frequent intervals of awaken requesting to get up out of bed,, and to call her son and saughter Reoriented

## 2022-04-26 NOTE — Progress Notes (Signed)
Speech Language Pathology Daily Session Note  Patient Details  Name: [name] MRN: [id number] Date of Birth: 10-22-1925  Today's Date: 04/26/2022 SLP Individual Time: 0915-0950 SLP Individual Time Calculation (min): 35 min and Today's Date: 04/26/2022 SLP Missed Time: 10 Minutes Missed Time Reason: Patient fatigue  Short Term Goals: Week 1: SLP Short Term Goal 1 (Week 1): Pt will utilize external orientation and memory aids to answer orientation questions with 100% accuracy given min assistance. SLP Short Term Goal 2 (Week 1): Pt will complete functional problem solving tasks with >80% accuracy given min assistance. SLP Short Term Goal 3 (Week 1): Pt will utilize compensatory word-finding strategies to reduce communication breakdowns in >80% of opportunities given min assistance. SLP Short Term Goal 4 (Week 1): Pt will complete language comprehension tasks to further determine comprehension goals as needed.  Skilled Therapeutic Interventions: Skilled treatment session focused on cognitive goals. Upon arrival, patient was awake while upright in the wheelchair. Patient was independently oriented to place (hospital) and situation but required Mod verbal cues for orientation time. Therefore, SLP provided patient with an external aid in which she utilized with Min verbal cues. Patient reported that she loves to read and has several books she would like to read once at home. SLP facilitated session by providing Mod I for patient to read aloud at the paragraph level. Max verbal cues were needed for sustained attention to task as patient was often falling asleep. Patient unable to maintain arousal for more than 30 second intervals, therefore, SLP ended session early. Patient left upright in wheelchair with alarm on and all needs within reach. Continue with current plan of care.      Pain Pain Assessment Pain Scale: 0-10 Pain Score: 0-No pain Faces Pain Scale: No hurt  Therapy/Group:  Individual Therapy  Menno Vanbergen 04/26/2022, 1:10 PM

## 2022-04-26 NOTE — Progress Notes (Signed)
Physical Therapy Session Note  Patient Details  Name: [name] MRN: [id number] Date of Birth: [birth year]  Today's Date: 04/26/2022 PT Individual Time: 1st Treatment Session: 1100-1205; 2nd Treatment Session: 1445-1530 PT Individual Time Calculation (min): 65 min; 45 min  Short Term Goals: Week 1:  PT Short Term Goal 1 (Week 1): Patient will perform bed mobility with MinA and LRAD PT Short Term Goal 2 (Week 1): Patient will ambulate 23' with LRAD and MinA PT Short Term Goal 3 (Week 1): Patient will perform transfers with LRAD and MinA  Skilled Therapeutic Interventions/Progress Updates:  1st Treatment Session- Patient greeted sitting upright in wheelchair in room and agreeable to PT treatment session. Patient wheeled to rehab gym for time management and energy conservation.   Patient stood from wheelchair with RW and MinA for improved anterior weight shift. VC for proper hand placement, tucking B feet and increased anterior weight shift.   Patient gait trained x78' with RW and MinA- Therapist providing facilitation for lateral weight shifting in order to improve swing phase of gait. Patient required increased time to complete due to decreased cadence and impaired endurance/activity tolerance.   Patient performed sit/stand x5 with RW and CGA- VC/TC for increased anterior weight shift, tucking B feet and proper hand placement with good effort note, however unable to independently recall without cues.   Patient completed the Kinetron x8 minutes at 10 cm/sec- Therapist providing TC/VC for improved L LE activation with overpressure at times to ensure pushing pedal all the way down.   Patient performed seated therex in order to increase L LE ROM and strength for improved functional mobility- L LE LAQ with 4#, x10 L hip flexion with 4#, x10  Patient returned to her room requesting to use the restroom- Patient stood from wheelchair with RW and CGA and then ambulated to/from the  bathroom and her wheelchair with MinA for improved lateral weight shifting. Patient required assistance for maneuvering RW when turning toward the toilet. Patient performed back pericare, however nothing was noted to be in the toilet. Patient stood from toilet with MinA and pulled up brief and pants with MinA. Patient returned to her wheelchair where she was left sitting upright in posey belt on, call bell within reach, tray table in front and all needs met.    1st Treatment Session- Patient greeted sitting upright in wheelchair asleep with family present and agreeable to PT treatment session. Therapist spent an extensive portion of treatment session educating family on current level of function, projected level of function, discharge planning, safety measurements, etc. Patient wheeled to rehab gym for time management and energy conservation. Patient performed seated therex in order to increased B LE strength and ROM and improve functional mobility: five repetitions of marches, ankle pumps, and long arc quads. Patient required max encouragement and education to complete exercises as patient reports she is very tired and "does not want to do anything to harm her body." Patient returned to her room and stood from wheelchair with RW and CGA/MinA- Patient then ambulated ~10' to the EOB where she was able to transition to supine with SBA. Patient required MinA for repositioning in the bed. Patient left supine in bed with [name] present, bed alarm on, call bell within reach and all needs met. [name] asking about pain medication and purewick- Notified RN, Production assistant, radio.     Therapy Documentation Precautions:  Precautions Precautions: Fall Restrictions Weight Bearing Restrictions: No   Therapy/Group: Individual Therapy  [name] 04/26/2022, 8:02 AM

## 2022-04-26 NOTE — Progress Notes (Signed)
Occupational Therapy Session Note  Patient Details  Name: [name] MRN: [id number] Date of Birth: [birth year]  Today's Date: 04/26/2022 OT Individual Time: [id number] OT Individual Time Calculation (min): 60 min    Short Term Goals: Week 1:  OT Short Term Goal 1 (Week 1): Pt will complete 2/3 steps of toileting OT Short Term Goal 2 (Week 1): Pt will thread BLE into pants with AE PRN OT Short Term Goal 3 (Week 1): Pt will don shirt with S OT Short Term Goal 4 (Week 1): Pt will groom in standing wiht CGA to demo improved activity tolerance  Skilled Therapeutic Interventions/Progress Updates: Patient received sitting up in bed awake. Agreeable to OT treatment. Assisted patient with opening juice and syrup containers for self feeding task. Patient able to cut food and eat without assist. Continued with ADL's: Bathing: sitting at the sink.   Grooming: set up assist from w/c level seated at the sink. Dressing: Min assist to unbutton to doff night shirt. Set up to don pull over shirt. LB dressing Mod/ Max assist due to decreased balance when standing to the walker and reaching forward to don socks. Stand pivot OOB: Mod assist to move from EOB to w/c with a rolling walker. Patient with good participation and motivation to achieve STG's. Continue with skilled OT POC to achieve all POC goals for a safe discharge home.      Therapy Documentation Precautions:  Precautions Precautions: Fall Restrictions Weight Bearing Restrictions: No General:   Vital Signs: Therapy Vitals Temp: 98.8 F (37.1 C) Temp Source: Oral Pulse Rate: (!) 54 Resp: 18 BP: (!) 161/90 Patient Position (if appropriate): Lying Oxygen Therapy SpO2: 94 % O2 Device: Room Air Pain:Patient reports having a headache, but states having received something for it. Not limiting her ability/desire to participate. No other complaints.     Therapy/Group: Individual Therapy  Hermina Barters 04/26/2022, 11:41 AM

## 2022-04-27 DIAGNOSIS — I63511 Cerebral infarction due to unspecified occlusion or stenosis of right middle cerebral artery: Secondary | ICD-10-CM | POA: Diagnosis not present

## 2022-04-27 LAB — BASIC METABOLIC PANEL
Anion gap: 9 (ref 5–15)
BUN: 16 mg/dL (ref 8–23)
CO2: 24 mmol/L (ref 22–32)
Calcium: 8.9 mg/dL (ref 8.9–10.3)
Chloride: 103 mmol/L (ref 98–111)
Creatinine, Ser: 1.22 mg/dL — ABNORMAL HIGH (ref 0.44–1.00)
GFR, Estimated: 41 mL/min — ABNORMAL LOW (ref >60)
Glucose, Bld: 112 mg/dL — ABNORMAL HIGH (ref 70–99)
Potassium: 3.8 mmol/L (ref 3.5–5.1)
Sodium: 136 mmol/L (ref 135–145)

## 2022-04-27 LAB — MAGNESIUM: Magnesium: 2.1 mg/dL (ref 1.7–2.4)

## 2022-04-27 MED ORDER — APIXABAN 2.5 MG PO TABS
2.5000 mg | ORAL_TABLET | Freq: Two times a day (BID) | ORAL | Status: DC
  Administered 2022-04-27 – 2022-04-28 (×2): 2.5 mg via ORAL
  Filled 2022-04-27 (×2): qty 1

## 2022-04-27 NOTE — Discharge Summary (Signed)
Physician Discharge Summary  Patient ID: [name] MRN: [id number] DOB/AGE: February 28, 1926 89 y.o.  Admit date: 04/20/2022 Discharge date: 04/28/2022  Discharge Diagnoses:  Principal Problem:   Acute ischemic right MCA stroke [facility]) Active Problems:   Hypertension   Memory change   Primary osteoarthritis involving multiple joints   Stage III chronic kidney disease (HCC)   Deficiency anemia   Depressive disorder   Insomnia   Discharged Condition: stable  Significant Diagnostic Studies: CT HEAD WO CONTRAST (5MM)  Result Date: 04/28/2022 CLINICAL DATA:  Fall EXAM: CT HEAD WITHOUT CONTRAST TECHNIQUE: Contiguous axial images were obtained from the base of the skull through the vertex without intravenous contrast. RADIATION DOSE REDUCTION: This exam was performed according to the departmental dose-optimization program which includes automated exposure control, adjustment of the mA and/or kV according to patient size and/or use of iterative reconstruction technique. COMPARISON:  Head CT 04/16/2022, brain MRI 04/18/2018 FINDINGS: Brain: There is no acute intracranial hemorrhage or extra-axial fluid collection. There is hypodensity in the right temporal lobe consistent with evolving infarct which was acute on the MRI from 04/17/2022. There is additional smaller revolving infarct in the left aspect of the corpus callosum, also acute on the MRI. There is no new acute territorial infarct or evidence of hemorrhagic transformation. Encephalomalacia in the right anterior frontal lobe is unchanged. Smaller additional remote infarcts in the right basal ganglia, left cerebellar hemisphere, and left thalamus are unchanged. Additional confluent hypodensity in the supratentorial white matter consistent with underlying chronic small-vessel ischemic change is stable. The pituitary and suprasellar region are normal. There is no mass lesion. There is no mass effect or midline shift. Vascular: There is  calcification of the bilateral carotid siphons and vertebral arteries. Skull: Normal. Negative for fracture or focal lesion. Sinuses/Orbits: There is chronic left sphenoid sinusitis with hyperdense material which may reflect inspissated secretions and/or fungal colonization, unchanged. Bilateral lens implants are in place. The globes and orbits are otherwise unremarkable. Other: None. IMPRESSION: 1. No new acute intracranial pathology. 2. Evolving infarcts in the right temporal lobe and corpus callosum, acute on the MRI from 04/17/2022. Electronically Signed   By: Valetta Mole M.D.   On: 04/28/2022 09:47   VAS [place] LOWER EXTREMITY VENOUS (DVT)  Result Date: 04/23/2022  Lower Venous DVT Study Patient Name:  [name]  Date of Exam:   04/23/2022 Medical Rec #: [id number]                  Accession #:    [id number] Date of Birth: 1926-02-04                   Patient Gender: F Patient Age:   31 years Exam Location:  [facility] Procedure:      VAS [place] LOWER EXTREMITY VENOUS (DVT) Referring Phys: Durel Salts --------------------------------------------------------------------------------  Indications: Right calf pain. Other             04-16-2022 IR percutaneous thrombectomy with right groin Indications:      catheter. Limitations: Patient pain. Comparison Study: No prior studies. Performing Technologist: Darlin Coco RDMS, RVT  Examination Guidelines: A complete evaluation includes B-mode imaging, spectral Doppler, color Doppler, and power Doppler as needed of all accessible portions of each vessel. Bilateral testing is considered an integral part of a complete examination. Limited examinations for reoccurring indications may be performed as noted. The reflux portion of the exam is performed with the patient in reverse Trendelenburg.  +---------+---------------+---------+-----------+----------+--------------+ RIGHT  CompressibilityPhasicitySpontaneityPropertiesThrombus Aging  +---------+---------------+---------+-----------+----------+--------------+ CFV      Full           Yes      Yes                                 +---------+---------------+---------+-----------+----------+--------------+ SFJ      Full                                                        +---------+---------------+---------+-----------+----------+--------------+ FV Prox  Full                                                        +---------+---------------+---------+-----------+----------+--------------+ FV Mid   Full                                                        +---------+---------------+---------+-----------+----------+--------------+ FV DistalFull                                                        +---------+---------------+---------+-----------+----------+--------------+ PFV      Full                                                        +---------+---------------+---------+-----------+----------+--------------+ POP      Full           Yes      Yes                                 +---------+---------------+---------+-----------+----------+--------------+ PTV      Full                                                        +---------+---------------+---------+-----------+----------+--------------+ PERO     Full                                                        +---------+---------------+---------+-----------+----------+--------------+ Gastroc  Full                                                        +---------+---------------+---------+-----------+----------+--------------+   +----+---------------+---------+-----------+----------+--------------+  LEFTCompressibilityPhasicitySpontaneityPropertiesThrombus Aging +----+---------------+---------+-----------+----------+--------------+ CFV Full           Yes      Yes                                  +----+---------------+---------+-----------+----------+--------------+     Summary: RIGHT: - There is no evidence of deep vein thrombosis in the lower extremity.  - No cystic structure found in the popliteal fossa.  - Incidental: Collection at the right groin superficial to the right common femoral artery. No flow visualized internally. Given recent catheterization, may represent hematoma/thrombosed pseudoaneurysm.   *See table(s) above for measurements and observations. Electronically signed by Harold Barban MD on 04/23/2022 at 9:24:03 PM.    Final     Labs:  Basic Metabolic Panel: Recent Labs  Lab 04/22/22 0454 04/23/22 0540 04/24/22 0636 04/26/22 0909 04/27/22 0556 04/28/22 0540  NA  --  134*  --  135 136 134*  K  --  3.7  --  3.7 3.8 4.1  CL  --  99  --  102 103 103  CO2  --  23  --  21* 24 23  GLUCOSE  --  102*  --  222* 112* 81  BUN  --  13  --  16 16 14  $ CREATININE  --  0.91  --  1.35* 1.22* 1.17*  CALCIUM  --  9.3  --  9.2 8.9 9.2  MG 1.7 1.7 1.6* 1.6* 2.1 2.0    CBC:    Latest Ref Rng & Units 04/28/2022    5:40 AM 04/26/2022    9:09 AM 04/21/2022    7:07 AM  CBC  WBC 4.0 - 10.5 K/uL 6.5  8.0  8.5   Hemoglobin 12.0 - 15.0 g/dL 9.7  10.8  9.9   Hematocrit 36.0 - 46.0 % 30.2  33.8  30.3   Platelets 150 - 400 K/uL 324  365  236      CBG: No results for input(s): "GLUCAP" in the last 168 hours.  Brief HPI:   [name] is a 89 y.o. female with history of CAD, TVAR, HTN, AF, CKD, mild memory loss, breast cancer who was admitted on 04/16/22 with left-sided weakness, difficulty speaking and right gaze preference.  CTA head/neck showed right-MCA M1 occlusion with incidental multiple thyroid nodules.  TNK administered and she underwent cerebral angio with thrombectomy.  Postprocedure groin hematoma treated with manual pressure and quick clot.  MRI brain done revealing acute infarct in right temporal lobe, body of corpus callosum, right corona radiata and left centrum  ovale with scattered area of susceptibility question microhemorrhages.    She was also noted to be COVID-positive but asymptomatic and reports of COVID last November.  Stroke was felt to be embolic due to A-fib and she was started on low-dose Eliquis.  2D echo showed EF of 40 to 45% with global hypokinesis.  She did have some issues with insomnia as well as DOE but respiratory status was improving.  She continued to be limited by right foot pain, left-sided weakness, cognitive deficits requiring increased time for simple commands, difficulty with sequencing and was limited by hearing loss.  CIR was recommended due to functional decline.   Hospital Course: [name] was admitted to rehab 04/20/2022 for inpatient therapies to consist of PT, ST and OT at least three hours five days a week. Past admission physiatrist, therapy team  and rehab RN have worked together to provide customized collaborative inpatient rehab. Her blood pressures were monitored on TID basis and was noted to be labile therefore amlodipine was titrated upwards.  Serial check of electrolytes showed worsening of renal status and she was briefly hydrated with IV fluids with improvement in AKI and downward trend in serum creatinine to 1.2.  Lasix was decreased to once a day on 02/12 and blood pressure/weights have been stable.  Voiding was monitored with PVR checks and as mobility improved PVR volumes decreased.  Magnesium levels noted to be low at 1.6 and was repleted with IV magnesium and Mag-Ox increased to 400 mg twice daily.  Persistent hypokalemia has resolved with supplementation.    Follow-up CBC has shown H&H to be relatively stable and no signs of rectal bleeding reported.  Sleep-wake disruption has improved with resumption of Lunesta as well as melatonin.  She has history of chronic diarrhea however this has not been an issue during her stay.  Mood and anxiety have been relatively stable.  She has made steady gains  during his stay and currently requires min assist overall.  She has had issues with bilateral joint which has been treated with local measures as well as scheduled Tylenol TID.  BLE dopplers done due to calf pain and was negative for DVT.  She was found on the floor on the day of discharge and repeat labs as well as CT head were negative.  UA ordered but not done as family preferred not to delay discharge.  They were advised to monitor for any signs of cognitive changes or infection.  She will continue to receive follow-up home health PT, ST and OT by [facility] after discharge.   Rehab course: During patient's stay in rehab weekly team conferences were held to monitor patient's progress, set goals and discuss barriers to discharge. At admission, patient required mod assist with mobility and with ADL tasks. She presented with mild expressive and some receptive impairments which were compounded by her hearing loss. She  has had improvement in activity tolerance, balance, postural control as well as ability to compensate for deficits.. She requires min assist to complete ADL tasks. She requires min assist for transfers and CGA to ambulate 150' with RW.  She has made slow and inconsistent gains cognitively and requires min cues for orientation with use of external aids. She requires mod to max verbal cues to complete functional and familiar tasks. Family education has been completed.    Disposition: Home  Diet: Regular.   Special Instructions: Needs supervision for safety.    Discharge Instructions     Ambulatory referral to Neurology   Complete by: As directed    An appointment is requested in approximately:6 weeks   Ambulatory referral to Physical Medicine Rehab   Complete by: As directed       Allergies as of 04/28/2022       Reactions   Latex Hives, Swelling   Other reaction(s): Unknown   Lisinopril Cough   Other reaction(s): Unknown   Povidone Iodine Other (See Comments)    Blisters Other reaction(s): Unknown   Povidone-iodine    Other Reaction(s): Unknown        Medication List     STOP taking these medications    cloNIDine 0.1 MG tablet Commonly known as: CATAPRES   diclofenac 25 MG EC tablet Commonly known as: VOLTAREN   docusate sodium 100 MG capsule Commonly known as: COLACE   levothyroxine 50 MCG  tablet Commonly known as: SYNTHROID   Magnesium Oxide -Mg Supplement 500 MG Caps Replaced by: magnesium oxide 400 (240 Mg) MG tablet   pantoprazole 40 MG tablet Commonly known as: PROTONIX   polyethylene glycol 17 g packet Commonly known as: MIRALAX / GLYCOLAX       TAKE these medications    acetaminophen 325 MG tablet Commonly known as: TYLENOL Take 2 tablets (650 mg total) by mouth every 6 (six) hours. What changed:  when to take this reasons to take this   albuterol 108 (90 Base) MCG/ACT inhaler Commonly known as: VENTOLIN HFA Inhale 2 puffs into the lungs every 4 (four) hours as needed for wheezing or shortness of breath.   ALIGN PO Take 1 capsule by mouth daily.   amLODipine 5 MG tablet Commonly known as: NORVASC Take 1 tablet (5 mg total) by mouth 2 (two) times daily. What changed:  medication strength how much to take   apixaban 2.5 MG Tabs tablet Commonly known as: ELIQUIS Take 1 tablet (2.5 mg total) by mouth 2 (two) times daily. What changed:  medication strength how much to take   atorvastatin 80 MG tablet Commonly known as: LIPITOR Take 1 tablet (80 mg total) by mouth daily.   buPROPion 300 MG 24 hr tablet Commonly known as: WELLBUTRIN XL Take 300 mg by mouth every morning.   busPIRone 5 MG tablet Commonly known as: BUSPAR Take 1 tablet (5 mg total) by mouth 3 (three) times daily as needed (anxiety).   diclofenac Sodium 1 % Gel Commonly known as: VOLTAREN Apply 4 g topically 4 (four) times daily.   diphenoxylate-atropine 2.5-0.025 MG tablet Commonly known as: LOMOTIL Take 1 tablet by mouth 4  (four) times daily as needed for diarrhea or loose stools.   Eszopiclone 3 MG Tabs Take 3 mg by mouth at bedtime.   FeroSul 325 (65 FE) MG tablet Generic drug: ferrous sulfate Take 325 mg by mouth 2 (two) times daily.   fluticasone 50 MCG/ACT nasal spray Commonly known as: FLONASE Place 1 spray into both nostrils in the morning and at bedtime.   furosemide 20 MG tablet Commonly known as: LASIX Take 1 tablet (20 mg total) by mouth daily. What changed: when to take this   gabapentin 100 MG capsule Commonly known as: NEURONTIN Take 1 capsule (100 mg total) by mouth 2 (two) times daily.   magnesium oxide 400 (240 Mg) MG tablet Commonly known as: MAG-OX Take 1 tablet (400 mg total) by mouth 2 (two) times daily. Replaces: Magnesium Oxide -Mg Supplement 500 MG Caps   melatonin 3 MG Tabs tablet Take 1 tablet (3 mg total) by mouth at bedtime as needed.   metoprolol succinate 25 MG 24 hr tablet Commonly known as: TOPROL-XL Take 0.5 tablets (12.5 mg total) by mouth every 8 (eight) hours. What changed:  how much to take when to take this   potassium chloride SA 20 MEQ tablet Commonly known as: KLOR-CON M Take 1 tablet (20 mEq total) by mouth daily.        Follow-up Information     Seward Meth, NP Follow up.   Specialty: Internal Medicine Why: Call in 1-2 days for post hospital follow up Contact information: [address] [place] [address] [phone]         Durel Salts C, DO Follow up.   Specialty: Physical Medicine and Rehabilitation Why: office will call you with follow up appointment Contact information: [address] [place]  [place] [address] [phone]         [facility] Follow up.   Why: office will call you with follow up appointment Contact information: [address]     [place] [phone]                Signed: Bary Leriche 04/28/2022, 5:49  PM

## 2022-04-27 NOTE — Progress Notes (Signed)
Physical Therapy Session Note  Patient Details  Name: [name] MRN: [id number] Date of Birth: 1926/02/08  Today's Date: 04/27/2022 PT Individual Time: 1345-1445 PT Individual Time Calculation (min): 60 min   Short Term Goals: Week 1:  PT Short Term Goal 1 (Week 1): Patient will perform bed mobility with MinA and LRAD PT Short Term Goal 2 (Week 1): Patient will ambulate 58' with LRAD and MinA PT Short Term Goal 3 (Week 1): Patient will perform transfers with LRAD and MinA  Skilled Therapeutic Interventions/Progress Updates:  Patient greeted sitting upright in wheelchair in room with family present and agreeable to PT treatment session- Patient requesting to use the restroom prior to start of session. Patient stood from wheelchair with MinA for anterior weight shift and ambulated to/from the bathroom with RW and CGA/MinA for safety. Patient sat on the toilet and required [place] for managing brief and pants. Patient returned to her wheelchair and was wheeled to rehab gym for time management and energy conservation.   Patient performed car transfer with RW and CGA for safety- Once seated in the car simulator, patient was able to place B LE into/out of the car independently.   Patient gait trained x150' with RW and CGA for safety- VC throughout for increased B step length and improve lateral weight shifting with minor facilitation at her hips.   Patient ascended/descended x8 steps with B HR and CGA for safety- VC for placing entire foot on each step for improved safety/stability.   Patient required extended seated rest break in between all above activities secondary to fatigue/poor endurance and activity tolerance.   Patient wheeled back to her room and left sitting upright in wheelchair with family present, call bell within reach and all needs met. Family reporting they are taking patient home tomorrow and were provided with education regarding verbal cues, safety plans, equipment  needs, etc.    Therapy Documentation Precautions:  Precautions Precautions: Fall Restrictions Weight Bearing Restrictions: No   Therapy/Group: Individual Therapy  Aubriella Perezgarcia 04/27/2022, 7:51 AM

## 2022-04-27 NOTE — Progress Notes (Addendum)
PROGRESS NOTE   Subjective/Complaints:  No events overnight. Patient lethargic this AM, endorses no concerns, complaints. She will have 24/7 care at home per family.   LBM 2/12  ROS: +Joint pain - b/l calf - improved, +insomnia, +chronic diarrhea - improving Denies fevers, chills, N/V, abdominal pain, SOB, cough, chest pain, new weakness or paraesthesias.    Objective:   No results found. Recent Labs    04/26/22 0909  WBC 8.0  HGB 10.8*  HCT 33.8*  PLT 365    Recent Labs    04/26/22 0909 04/27/22 0556  NA 135 136  K 3.7 3.8  CL 102 103  CO2 21* 24  GLUCOSE 222* 112*  BUN 16 16  CREATININE 1.35* 1.22*  CALCIUM 9.2 8.9     Intake/Output Summary (Last 24 hours) at 04/27/2022 0853 Last data filed at 04/26/2022 1800 Gross per 24 hour  Intake 125 ml  Output --  Net 125 ml         Physical Exam: Vital Signs Blood pressure 125/79, pulse (!) 59, temperature 98.4 F (36.9 C), temperature source Oral, resp. rate 18, height 4' 11"$  (1.499 m), weight 59.8 kg, SpO2 93 %.  Constitutional: No apparent distress, laying in bed, irritable. Appropriate appearance for age.  HENT: Trachea midline. Atraumatic, normocephalic. +HOH Eyes: PERRLA. EOMI. No obvious nystagmus but pt uncooperative.  Cardiovascular: irregularly irregular, no murmurs/rub/gallops. trace BLE edema - improved.   Respiratory: CTAB. No rales, rhonchi, or wheezing. On RA.  Abdomen: + bowel sounds, normoactive. No distention or apparent tenderness.  Skin: Mild ecchymosis on R hand. Otherwise c/d/I.  MSK: No TTP b/l calf squeeze   Antigravity and against resistance in all 4 extremities   Neuro: AAO to self only. + Mild left facial droop + mild dysarthria - resolved Decreased to light touch sensation on left side - improving + L mild hemineglect  - resolved   Assessment/Plan: 1. Functional deficits which require 3+ hours per day of interdisciplinary  therapy in a comprehensive inpatient rehab setting. Physiatrist is providing close team supervision and 24 hour management of active medical problems listed below. Physiatrist and rehab team continue to assess barriers to discharge/monitor patient progress toward functional and medical goals  Care Tool:  Bathing    Body parts bathed by patient: Right arm, Left arm, Chest, Abdomen, Front perineal area, Right upper leg, Left upper leg, Face   Body parts bathed by helper: Buttocks, Right lower leg, Left lower leg     Bathing assist Assist Level: Moderate Assistance - Patient 50 - 74%     Upper Body Dressing/Undressing Upper body dressing   What is the patient wearing?: Pull over shirt    Upper body assist Assist Level: Contact Guard/Touching assist    Lower Body Dressing/Undressing Lower body dressing      What is the patient wearing?: Pants     Lower body assist Assist for lower body dressing: Maximal Assistance - Patient 25 - 49%     Toileting Toileting    Toileting assist Assist for toileting: Moderate Assistance - Patient 50 - 74%     Transfers Chair/bed transfer  Transfers assist     Chair/bed transfer  assist level: Minimal Assistance - Patient > 75%     Locomotion Ambulation   Ambulation assist      Assist level: Minimal Assistance - Patient > 75% Assistive device: Walker-rolling Max distance: 150'   Walk 10 feet activity   Assist     Assist level: Minimal Assistance - Patient > 75% Assistive device: Walker-rolling   Walk 50 feet activity   Assist    Assist level: Minimal Assistance - Patient > 75% Assistive device: Walker-rolling    Walk 150 feet activity   Assist Walk 150 feet activity did not occur: Safety/medical concerns  Assist level: Minimal Assistance - Patient > 75% Assistive device: Walker-rolling    Walk 10 feet on uneven surface  activity   Assist     Assist level: Moderate Assistance - Patient - 50 -  74% Assistive device: Aeronautical engineer Is the patient using a wheelchair?: Yes (per therapist documentation, dependent w/c use for energy conservation) Type of Wheelchair: Manual    Wheelchair assist level: Dependent - Patient 0% Max wheelchair distance: Therapist propelled manual wheelchair 150' for energy conservation as patient presents with poor endurance/activity tolerance    Wheelchair 50 feet with 2 turns activity    Assist        Assist Level: Dependent - Patient 0%   Wheelchair 150 feet activity     Assist      Assist Level: Dependent - Patient 0%   Blood pressure 125/79, pulse (!) 59, temperature 98.4 F (36.9 C), temperature source Oral, resp. rate 18, height 4' 11"$  (1.499 m), weight 59.8 kg, SpO2 93 %.  Medical Problem List and Plan: 1. 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  Antithrombotics: -DVT/anticoagulation:  Pharmaceutical: Eliquis 52m BID--resumed 12/05 -> reduced to 2.5 mg BID per weight dosing 2/13             -antiplatelet therapy: N/A 3. Pain Management:  Tylenol 6554mq6h   - 2/7: voltaren gel QID to R foot and L hand -04/24/22 family brought aspercreme, requesting tylenol scheduled, ordered 65068m6h  - 2/12: started gabapentin 100 mg BID for calf pain, ? D/t peripheral neuropathy - improved  4. Mood/Behavior/Sleep:  LCSW to follow for evaluation and support.              -antipsychotic agents: N/A -see below #18  5. Neuropsych/cognition: This patient is not capable of making decisions on his  own behalf. --had mild cognitive impairments at baseline. Will add delirum precautions - had 24/7 caregivers at home  6. Skin/Wound Care: Routine pressure relief measures 7. Fluids/Electrolytes/Nutrition: Monitor I/O. Monitor  weekly labs, next 04/26/22 -Mg and K low on intake labs; replete 40 meq BID K; was already on Mag 400 mg dial (200 mg BID); d/t diarrhea will try IV repletion today and repeat labs 2/9 - Mg 1.7 today. Increase PO mag to 400 mg BID d/t repeated lows/borderline low, repeat in AM labs 2/9  - 2/9: repeat stable 1.7; continue and repeat Monday -04/24/22 Mg 1.6, but fairly stable, cont MagOx 400m18mD, monitor next on 04/26/22 (see #14 below) - 2/12: decreased K/Lasix to once a day. Mg  IV for repletion. Repeat labs 2/13 stable K, Mag normal.   8. Chronic diastolic HF/global hypokinesis: EF-45-50% with severe concentric LVH --Monitor weights daily and for signs of overload --Lasix 31m BID, metoprolol 12.561mq8h, Lipitor 8059mD -2/7: LE edema stable; monitor weights -2/9: RLE > LLE edema; Duplex ordered RLE; discussed w/ nursing obtaining daily weights -04/24/22 no weight done today, ordered for daily weights, monitor. LE swelling seems better; Duplex U/S negative for DVT, showing likely hematoma superficial to R common femoral artery -04/25/22 wt down, cont to trend - 2/12: Decreasing lasix to once daily d/t aki; continue to trend  -weight downtrending Filed Weights   04/23/22 1211 04/25/22 0600 04/26/22 0550  Weight: 64.5 kg 60.5 kg 59.8 kg    9. Covid 11/23/Hypoxia: At baseline had SOB/DOE w/activity--few steps -- Add flutter valve and encourage pulmonary hygiene.  --has been requiring NRB for activity  10. A fib: Monitor HR TID--on metoprolol and eliquis 11.  HTN: Monitor BP TID--on Metoprolol, Clonidine, norvasc  - 2/9: Increase norvasc from 2.5 mg BID to 5 mg bid  -04/25/22 BPs stabilizing, cont regimen, monitor  2/12: DC lasix to daily - stable 2/13 Vitals:   04/24/22 0437 04/24/22 1424 04/24/22 1919 04/25/22 0600  BP: (!) 139/90 128/80 (!) 116/102 133/70   04/25/22 0918 04/25/22 1259 04/25/22 1941 04/26/22 0541  BP: (!) 118/59 101/61 114/83 (!) 161/90   04/26/22 0911 04/26/22 1300  04/26/22 1948 04/27/22 0613  BP: (!) 158/88 (!) 123/90 131/76 125/79      12. Acute on chronic renal failure:  - Cr stable 1.0; monitor on weekly labs starting 04/26/22 - Cr 1.3 today; decrease lasix, add gentle IVF 75/hr tonight and recheck in AM -> cr 1.2, mildly improved  13.  Persistent hypokalemia: Supplement today-- increased to 40 meq BID 2/7--> improved to 3.7; repeat Monday 04/26/22  - Decrease to once daily supplement with once daily lasix 2/12 - stable  14. Hypomagnesemia: Mg- 1.6 --will supplement today - increased to 400 mg daily + IV --> 400 mg BID 2/8; stable 1.7, continue and repeat Monday -04/24/22 Mg 1.6, relatively stable, cont MagOx and f/up with labs Mon 04/26/22 - 2/11: Mag still 1/6, IV repletion today, recheck in AM -> 2.1 2/13  15. Acute on chronic anemia/Groin hematoma: Hgb 11.1--->9.3 - stable             --has had melanotic stools past month and referred to Dr. HunBenson [place]- On iron supplement  16. Left breast cancer: Has declined surgery and stop Fulvestrant due to SE.  17. H/o chronic Diarrhea: Liquid stools documented-->d/c colace and miralax.  --PTA felt to be anxiety related.  - Switch iron to every other day for improved absorption, decreased s/e - Tolerating PO magnesium - Consider immodium if worsening  - LBM 2/12   18. Chronic insomnia: Used Lunesta 3 mg followed by Melatonin 10 mg every night per son.   -sleep wake chart. Will add melatonin 10 mg/hs  - 2/8: Start Melatonin 5 mg + Ambien 5 mg QHS PRN per formulary - 2/9: Confusion overnight and this AM, poor sleep, ?d/t ambien. Will DC medication, discuss w/ son today [place] formulary -04/24/22 ordered melatonin 65m71mnce it was still ordered as 5mg;53mded Trazodone 25mg 37mPRN; family brought Lunesta from home, order placed for this to be allowed to be given (3mg)-m58mtor -04/25/22 pt sleepy this morning, very irritable, appears similar to what happened with Ambien the other  day--  suspect Lunesta at play-- decreased to 1.47m QHS. Monitor closely, may need further adjustments to mood/sleep meds--no acute neuro deficits appreciated, doubt acute stroke or need for emergent work up today 2/12: Continue Lunesta, improved AM arousal with lower dose.    19. Anxiety/depression: Back on Wellbutrin 3015mQD with buspar 5m56mid prn.   20. Urinary retention?: Will order PVR/bladder scans.- hadn't voided this AM - resolved - PVRs not performed; incontinent intermittent voids, will wait for PVRs before treating - 2/7, 2/8 - 1x PVR 244 on 2/9; mildly elevated, will hold off on starting medications for urgency -2/12: [place] PRN, PVR remain low.  2/13: no changes today     LOS: 7 days A FACE TO FACE EVALUATION WAS PERFORMED  MorGertie Gowda13/2024, 8:53 AM

## 2022-04-27 NOTE — Progress Notes (Addendum)
Patient ID: [name], female   DOB: 08-Mar-1926, 89 y.o.   MRN: [id number]  SW went by pt room but no family present. SW will follow-up.   SW later met with pt son and dtr in room to provide updates from team conference, d/c date 2/15, and d/c recs- HHPT/OT/SLP and RW. Confirm they will find a RW for her as they are aware one if in storage. Prefers [facility]. Family aware if no HH in place at time of discharge, SW will continue to coordinate and will follow-up once in place. Pt will be a later discharge tomorrow as family cannot be here until around 1pm due to an appt in the morning.    1433-SW spoke with Ellen/Intake with regard to pt being previous patient/ Reports last seen in 2021. Also reports they do not have SLP, and unsure if they could accept referral due to staffing. SW will follow-up with SLP to confirm if needed, and will follow-up with Dorian Pod to see if [facility] therapies is an option.  SLP reports family can decided if needed.   SW sent HHPT/OT/SLP referral to Angie/[facility] and waiting on follow-up.   Loralee Pacas, MSW, [place] Office: [phone] Cell: [phone] Fax: [phone]

## 2022-04-27 NOTE — Progress Notes (Signed)
Physical Therapy Discharge Summary  Patient Details  Name: [name] MRN: [id number] Date of Birth: 09-09-1925  Date of Discharge from PT service:April 27, 2022  Patient has met 4 of 8 long term goals due to improved activity tolerance, improved balance, and increased strength.  Patient to discharge at an ambulatory level [place].   Patient's care partner is independent to provide the necessary physical and cognitive assistance at discharge.  Reasons goals not met: Patient still requires MinA for some functional mobility tasks at this time. Patient is discharging home earlier than anticipated per family decision and patient has limited motivation/desire to participate in treatment sessions leading to minimal changes in her functional mobility.   Recommendation:  Patient will benefit from ongoing skilled PT services in home health setting to continue to advance safe functional mobility, address ongoing impairments in dynamic stability, global strengthening, L NMR, endurance/activity tolerance, and minimize fall risk.  Equipment: No equipment provided  Reasons for discharge: discharge from hospital and family desire to take patient home earlier than anticipated.   Patient/family agrees with progress made and goals achieved: Yes  PT Discharge Precautions/Restrictions Precautions Precautions: Fall Restrictions Weight Bearing Restrictions: No Pain Interference Pain Interference Pain Effect on Sleep: 1. Rarely or not at all Pain Interference with Therapy Activities: 1. Rarely or not at all Pain Interference with Day-to-Day Activities: 1. Rarely or not at all Vision/Perception  Vision - History Ability to See in Adequate Light: 0 Adequate Perception Perception: Within Functional Limits Praxis Praxis: Intact  Cognition Overall Cognitive Status: Impaired/Different from baseline Arousal/Alertness: Awake/alert Orientation Level: Oriented to person Focused  Attention: Appears intact Sustained Attention: Impaired Memory: Impaired Awareness: Impaired Problem Solving: Impaired Reasoning: Impaired Safety/Judgment: Impaired Sensation Sensation Light Touch: Appears Intact Coordination Gross Motor Movements are Fluid and Coordinated: No Fine Motor Movements are Fluid and Coordinated: No Coordination and Movement Description: Global deconditioning, however improving. Motor  Motor Motor: Hemiplegia Motor - Skilled Clinical Observations: mild L hemi Motor - Discharge Observations: Mild L hemi  Mobility Bed Mobility Bed Mobility: Rolling Right;Rolling Left;Supine to Sit;Sit to Supine Rolling Right: Contact Guard/Touching assist Rolling Left: Contact Guard/Touching assist Supine to Sit: Minimal Assistance - Patient > 75% Sit to Supine: Supervision/Verbal cueing Transfers Transfers: Sit to Stand;Stand to Sit;Stand Pivot Transfers Sit to Stand: Minimal Assistance - Patient > 75% Stand to Sit: Minimal Assistance - Patient > 75% Stand Pivot Transfers: Contact Guard/Touching assist Stand Pivot Transfer Details: Verbal cues for sequencing;Verbal cues for precautions/safety;Verbal cues for safe use of DME/AE;Manual facilitation for weight shifting Transfer (Assistive device): Rolling walker Locomotion  Gait Ambulation: Yes Gait Assistance: Contact Guard/Touching assist Gait Distance (Feet): 150 Feet Assistive device: Rolling walker Gait Gait: Yes Gait Pattern: Decreased step length - left;Decreased stride length;Trunk flexed;Poor foot clearance - left Gait velocity: decreased Stairs / Additional Locomotion Stairs: Yes Stairs Assistance: Contact Guard/Touching assist Stair Management Technique: Two rails Number of Stairs: 8 Height of Stairs: 4 Ramp: Minimal Assistance - Patient >75% Curb: Nurse, mental health Mobility: No  Trunk/Postural Assessment  Cervical Assessment Cervical Assessment:  Exceptions to [facility] (Forward head) Thoracic Assessment Thoracic Assessment: Exceptions to [facility] (Rounded shoulders) Lumbar Assessment Lumbar Assessment: Exceptions to [facility] (Poterior pelvic tilt) Postural Control Postural Control: Deficits on evaluation Righting Reactions: Delayed and inadequate, but improving  Balance Balance Balance Assessed: Yes Static Sitting Balance Static Sitting - Balance Support: Feet supported;Bilateral upper extremity supported Static Sitting - Level of Assistance: 5: Stand by assistance Dynamic Sitting Balance Dynamic  Sitting - Balance Support: Feet supported;During functional activity Dynamic Sitting - Level of Assistance: 5: Stand by assistance Static Standing Balance Static Standing - Balance Support: During functional activity;Bilateral upper extremity supported Static Standing - Level of Assistance: 5: Stand by assistance Dynamic Standing Balance Dynamic Standing - Balance Support: During functional activity Dynamic Standing - Level of Assistance: 4: Min assist Extremity Assessment  RUE Assessment RUE Assessment: Exceptions to [facility] General Strength Comments: generalized weakness arthritis LUE Assessment LUE Assessment: Exceptions to [facility] General Strength Comments: generalized weakness arthritis RLE Assessment RLE Assessment: Within Functional Limits General Strength Comments: Global decondiitoning, but grossly 4/5 LLE Assessment LLE Assessment: Exceptions to [facility] General Strength Comments: Global deconditioning with L hemi, grossly 3+/5 and improving   Sachi Boulay 04/27/2022, 4:16 PM

## 2022-04-27 NOTE — Progress Notes (Signed)
Occupational Therapy Session Note  Patient Details  Name: [name] MRN: [id number] Date of Birth: 1925-04-08  Today's Date: 04/27/2022 Session 1 OT Individual Time: [id number] OT Individual Time Calculation (min): 30 min   Session 2 OT Individual Time: [id number] OT Individual Time Calculation (min): 45 min    Short Term Goals: Week 1:  OT Short Term Goal 1 (Week 1): Pt will complete 2/3 steps of toileting OT Short Term Goal 2 (Week 1): Pt will thread BLE into pants with AE PRN OT Short Term Goal 3 (Week 1): Pt will don shirt with S OT Short Term Goal 4 (Week 1): Pt will groom in standing wiht CGA to demo improved activity tolerance  Skilled Therapeutic Interventions/Progress Updates:    Session 1 Pt greeted semi-reclined in bed with nursing present to administer meds. Pt completed bed mobility with increased time and mod A. Nursing administered meds while pt sat EOB w/ supervision. Pt perseverating on getting her hot coffee. OT provided pt with a few sips of coffee, then she was agreeable to get to the chair. Min A stand-pivot to wc. Pt brought to the sink for bathing/dressing taks. We only had time for LB bathing/dressing. Sit,>stands with min A, but pt needed encouragement to perform her own self care. Pt stated "I'm too old to do things for myself." OT reviewed PLOF with pt and encouraged her to complete her own self-care. Pt was able to wash peri-area and buttocks in standing with 2 rest breaks. LB dressing with moderate A and cues for technique. Pt left seated in wc with alarm belt on, call bell in reach, and needs met.   Session 2 Pt greeted sitting in wc and agreeable to OT treatment session. UB bathing/dressing completed from wc at the sink with encouragement for pt to participate in her own self-care tasks. Pt completed these with min A overall and increased time. Pt brought to therapy gym to work on standing balance/endurance at high-low table using peg board  activity. Pt was not interested in peg board activity and just knocked over the pegs without really trying. She did however stand for 3, 3 minute stents with CGA. OT continued to remind pt her PLOF and goals for OT. Pt returned to room and left seated in wc with alarm belt on, call bell in reach, and needs met.   Therapy Documentation Precautions:  Precautions Precautions: Fall Restrictions Weight Bearing Restrictions: No Pain: Denies pain   Therapy/Group: Individual Therapy  Valma Cava 04/27/2022, 12:53 PM

## 2022-04-27 NOTE — Plan of Care (Signed)
  Problem: RH Balance Goal: LTG: Patient will maintain dynamic sitting balance (OT) Description: LTG:  Patient will maintain dynamic sitting balance with assistance during activities of daily living (OT) Outcome: Completed/Met Goal: LTG Patient will maintain dynamic standing with ADLs (OT) Description: LTG:  Patient will maintain dynamic standing balance with assist during activities of daily living (OT)  Outcome: Completed/Met Flowsheets (Taken 04/27/2022 1551) LTG: Pt will maintain dynamic standing balance during ADLs with: Minimal Assistance - Patient > 75%   Problem: RH Grooming Goal: LTG Patient will perform grooming w/assist,cues/equip (OT) Description: LTG: Patient will perform grooming with assist, with/without cues using equipment (OT) Outcome: Completed/Met   Problem: RH Bathing Goal: LTG Patient will bathe all body parts with assist levels (OT) Description: LTG: Patient will bathe all body parts with assist levels (OT) Outcome: Completed/Met   Problem: RH Dressing Goal: LTG Patient will perform upper body dressing (OT) Description: LTG Patient will perform upper body dressing with assist, with/without cues (OT). Outcome: Completed/Met   Problem: RH Dressing Goal: LTG Patient will perform lower body dressing w/assist (OT) Description: LTG: Patient will perform lower body dressing with assist, with/without cues in positioning using equipment (OT) Outcome: Completed/Met   Problem: RH Toileting Goal: LTG Patient will perform toileting task (3/3 steps) with assistance level (OT) Description: LTG: Patient will perform toileting task (3/3 steps) with assistance level (OT)  Outcome: Not Met (add Reason) Note: Needs min A for toileting-ESD   Problem: RH Functional Use of Upper Extremity Goal: LTG Patient will use RT/LT upper extremity as a (OT) Description: LTG: Patient will use right/left upper extremity as a stabilizer/gross assist/diminished/nondominant/dominant level with  assist, with/without cues during functional activity (OT) Outcome: Completed/Met   Problem: RH Toilet Transfers Goal: LTG Patient will perform toilet transfers w/assist (OT) Description: LTG: Patient will perform toilet transfers with assist, with/without cues using equipment (OT) Outcome: Not Met (add Reason) Note: Min A for toilet transfers-ESD   Problem: RH Tub/Shower Transfers Goal: LTG Patient will perform tub/shower transfers w/assist (OT) Description: LTG: Patient will perform tub/shower transfers with assist, with/without cues using equipment (OT) Outcome: Not Met (add Reason) Note: Min A for tub shower transfers-ESD

## 2022-04-27 NOTE — Progress Notes (Signed)
Physical Therapy Session Note  Patient Details  Name: [name] MRN: [id number] Date of Birth: 12-25-1925  Today's Date: 04/27/2022 PT Individual Time: 1055-1150 PT Individual Time Calculation (min): 55 min   Short Term Goals: Week 1:  PT Short Term Goal 1 (Week 1): Patient will perform bed mobility with MinA and LRAD PT Short Term Goal 2 (Week 1): Patient will ambulate 63' with LRAD and MinA PT Short Term Goal 3 (Week 1): Patient will perform transfers with LRAD and MinA  Skilled Therapeutic Interventions/Progress Updates:    Chart reviewed and pt agreeable to therapy. Pt received seated in WC with no c/o pain. Session focused on functional transfers and amb endurance to promote safe home access. Pt initiated session with questions about PT and discussion about reason for PT. Pt then completed sit to stand using CGA + RW. Pt completed 2x10 standing marches with CGA + RW. Pt then transferred to therapy gym for time management. In gym, pt amb 19f + 40 ft + 575fwith CGA + RW progressing to supervision + RW. Pt required significant rest breaks. Pt then returned to room and again discussed PT goals with PT per request to better understand reason for therapy. At end of session, [facility] nurse call bell and all needs in reach.     Therapy Documentation Precautions:  Precautions Precautions: Fall Restrictions Weight Bearing Restrictions: No     Therapy/Group: Individual Therapy  KiMarquette OldPT, DPT 04/27/2022, 12:22 PM

## 2022-04-27 NOTE — Progress Notes (Signed)
Occupational Therapy Discharge Summary  Patient Details  Name: [name] MRN: [id number] Date of Birth: 1925-04-08  Date of Discharge from OT service:April 27, 2022   Patient has met 7 of 10 long term goals due to improved activity tolerance, improved balance, postural control, ability to compensate for deficits, improved awareness, and improved coordination.  Patient to discharge at [facility] Assist level.  Patient's care partner is independent to provide the necessary physical and cognitive assistance at discharge.    Reasons goals not met: patient still requires min A for transfers and min A for toileting tasks.  Recommendation:  Patient will benefit from ongoing skilled OT services in home health setting to continue to advance functional skills in the area of BADL.  Equipment: No equipment provided  Reasons for discharge: treatment goals met and discharge from hospital  Patient/family agrees with progress made and goals achieved: Yes  OT Discharge ADL ADL Eating: Set up Grooming: Setup Where Assessed-Grooming: Standing at sink Upper Body Bathing: Supervision/safety Where Assessed-Upper Body Bathing: Standing at sink Lower Body Bathing: Minimal assistance Where Assessed-Lower Body Bathing: Sitting at sink, Standing at sink Upper Body Dressing: Setup Where Assessed-Upper Body Dressing: Sitting at sink Lower Body Dressing: Minimal assistance Where Assessed-Lower Body Dressing: Sitting at sink Toileting: Minimal assistance Where Assessed-Toileting: Bedside Commode, Toilet Toilet Transfer: Therapist, music Method: Ambulating Cognition Cognition Overall Cognitive Status: Impaired/Different from baseline Arousal/Alertness: Awake/alert Orientation Level: Person;Place;Situation Person: Oriented Place: Oriented Situation: Oriented Memory: Impaired Awareness: Impaired Problem Solving: Impaired Reasoning: Impaired Safety/Judgment:  Impaired Brief Interview for Mental Status (BIMS) Repetition of Three Words (First Attempt): 3 Temporal Orientation: Year: Missed by more than 5 years Temporal Orientation: Month: Accurate within 5 days Temporal Orientation: Day: Incorrect Recall: "Sock": Yes, no cue required Recall: "Blue": Yes, no cue required Recall: "Bed": Yes, no cue required BIMS Summary Score: 11 Sensation Coordination Gross Motor Movements are Fluid and Coordinated: No Fine Motor Movements are Fluid and Coordinated: No Motor  Motor Motor - Skilled Clinical Observations: mild L hemi Motor - Discharge Observations: Mild L hemi Mobility  Bed Mobility Supine to Sit: Minimal Assistance - Patient > 75% Sit to Supine: Supervision/Verbal cueing Transfers Sit to Stand: Minimal Assistance - Patient > 75% Stand to Sit: Minimal Assistance - Patient > 75%  Balance Static Sitting Balance Static Sitting - Balance Support: Feet supported;Bilateral upper extremity supported Static Sitting - Level of Assistance: 5: Stand by assistance Dynamic Sitting Balance Dynamic Sitting - Balance Support: Feet supported;During functional activity Dynamic Sitting - Level of Assistance: 5: Stand by assistance Static Standing Balance Static Standing - Balance Support: During functional activity Static Standing - Level of Assistance: 5: Stand by assistance Dynamic Standing Balance Dynamic Standing - Balance Support: During functional activity Dynamic Standing - Level of Assistance: 4: Min assist Extremity/Trunk Assessment RUE Assessment RUE Assessment: Exceptions to [facility] General Strength Comments: generalized weakness arthritis LUE Assessment LUE Assessment: Exceptions to [facility] General Strength Comments: generalized weakness arthritis   [name] [name] 04/27/2022, 3:59 PM

## 2022-04-27 NOTE — Patient Care Conference (Signed)
Inpatient RehabilitationTeam Conference and Plan of Care Update Date: 04/27/2022   Time: 11:01 AM    Patient Name: [name]      Medical Record Number: [id number]  Date of Birth: April 15, 1925 Sex: Female         Room/Bed: 4W10C/4W10C-01 Payor Info: Payor: MEDICARE / Plan: MEDICARE PART A AND B / Product Type: *No Product type* /    Admit Date/Time:  04/20/2022  4:43 PM  Primary Diagnosis:  Acute ischemic right MCA stroke [facility])  Hospital Problems: Principal Problem:   Acute ischemic right MCA stroke [facility])    Expected Discharge Date: Expected Discharge Date: 04/28/22  Team Members Present: Physician leading conference: Dr. Durel Salts Social Worker Present: Loralee Pacas, [place] Nurse Present: Tacy Learn, RN PT Present: Terence Lux, PT OT Present: Cherylynn Ridges, OT SLP Present: Weston Anna, SLP PPS Coordinator present : Gunnar Fusi, SLP     Current Status/Progress Goal Weekly Team Focus  Bowel/Bladder   incontinent of b/b; LBM: 2/12   gain continence of b/b   assist with toileting needs prn    Swallow/Nutrition/ Hydration               ADL's   Min A overall, does not like to participate in BADLs   supervsision/min A   self-care retraining, dc planning, pt/family education    Mobility   MinA for bed mobility, MinA/CGA for sit/stands with RW, transfers and gait up to 150'- Limited by endurance/activity tolerance and desire to participate   Supervision with LRAD  Dynamic stability, gloabl strengthening, endurance/activity tolerance, discharge planning, safety awareness, education, etc.    Communication                Safety/Cognition/ Behavioral Observations  Min-Mod A   Min A   orientation with use of aids, basic problem solving, sustained attention    Pain   c/o generalized pain; scheduled tylenol   pain level <4/10   assess pain QS and prn    Skin   skin intact   maintain skin integrity  assess skin QS and prn       Discharge Planning:  Return back to [place]. SW will confirm there are no barriers to discharge.   Team Discussion: Right MCA/CVA. Incontinent B/B. Pain managed with PRN medications. Sleep chart. Insomnia. Trazodone for sleep. Daily weight. Lasix added. Oral magnesium added. Caregivers at bedside daily.  Patient on target to meet rehab goals: yes, at goal level.  *See Care Plan and progress notes for long and short-term goals.   Revisions to Treatment Plan:  Discharge planned for the 15th but moved to the 14th.   Teaching Needs: Medications, safety, self care, skin care, gait/transfer training, etc   Current Barriers to Discharge: Incontinence  Possible Resolutions to Barriers: Caregiver education, nursing education     Medical Summary Current Status: medically complicated by insomnia, pain, cognitive deficits, electrolyte deficiencies, AKI, hypertension, mixed urinary incontinence and chronic diarrhea  Barriers to Discharge: Behavior/Mood;Electrolyte abnormality;Inadequate Nutritional Intake;Incontinence;Medical stability;Renal Insufficiency/Failure;Self-care education;Uncontrolled Hypertension;Uncontrolled Pain  Barriers to Discharge Comments: behavioral/cognitive challenges with participation, AKI due to dehydration and medication, pain control, insomnia Possible Resolutions to [employer] Focus: titrate BP medications and increase nutritional intakes, titrate medications for sleep and pain, replete electrolytes   Continued Need for Acute Rehabilitation Level of Care: The patient requires daily medical management by a physician with specialized training in physical medicine and rehabilitation for the following reasons: Direction of a multidisciplinary physical rehabilitation program to  maximize functional independence : Yes Medical management of patient stability for increased activity during participation in an intensive rehabilitation regime.:  Yes Analysis of laboratory values and/or radiology reports with any subsequent need for medication adjustment and/or medical intervention. : Yes   I attest that I was present, lead the team conference, and concur with the assessment and plan of the team.   Ernest Pine 04/27/2022, 3:15 PM

## 2022-04-28 ENCOUNTER — Inpatient Hospital Stay (HOSPITAL_COMMUNITY): Payer: Medicare Other

## 2022-04-28 DIAGNOSIS — I63511 Cerebral infarction due to unspecified occlusion or stenosis of right middle cerebral artery: Secondary | ICD-10-CM | POA: Diagnosis not present

## 2022-04-28 LAB — CBC WITH DIFFERENTIAL/PLATELET
Abs Immature Granulocytes: 0.05 10*3/uL (ref 0.00–0.07)
Basophils Absolute: 0.1 10*3/uL (ref 0.0–0.1)
Basophils Relative: 1 %
Eosinophils Absolute: 0.3 10*3/uL (ref 0.0–0.5)
Eosinophils Relative: 5 %
HCT: 30.2 % — ABNORMAL LOW (ref 36.0–46.0)
Hemoglobin: 9.7 g/dL — ABNORMAL LOW (ref 12.0–15.0)
Immature Granulocytes: 1 %
Lymphocytes Relative: 25 %
Lymphs Abs: 1.6 10*3/uL (ref 0.7–4.0)
MCH: 27.6 pg (ref 26.0–34.0)
MCHC: 32.1 g/dL (ref 30.0–36.0)
MCV: 86 fL (ref 80.0–100.0)
Monocytes Absolute: 0.5 10*3/uL (ref 0.1–1.0)
Monocytes Relative: 8 %
Neutro Abs: 3.9 10*3/uL (ref 1.7–7.7)
Neutrophils Relative %: 60 %
Platelets: 324 10*3/uL (ref 150–400)
RBC: 3.51 MIL/uL — ABNORMAL LOW (ref 3.87–5.11)
RDW: 19.8 % — ABNORMAL HIGH (ref 11.5–15.5)
WBC: 6.5 10*3/uL (ref 4.0–10.5)
nRBC: 0 % (ref 0.0–0.2)

## 2022-04-28 LAB — COMPREHENSIVE METABOLIC PANEL
ALT: 15 U/L (ref 0–44)
AST: 19 U/L (ref 15–41)
Albumin: 3.1 g/dL — ABNORMAL LOW (ref 3.5–5.0)
Alkaline Phosphatase: 47 U/L (ref 38–126)
Anion gap: 8 (ref 5–15)
BUN: 14 mg/dL (ref 8–23)
CO2: 23 mmol/L (ref 22–32)
Calcium: 9.2 mg/dL (ref 8.9–10.3)
Chloride: 103 mmol/L (ref 98–111)
Creatinine, Ser: 1.17 mg/dL — ABNORMAL HIGH (ref 0.44–1.00)
GFR, Estimated: 43 mL/min — ABNORMAL LOW (ref >60)
Glucose, Bld: 81 mg/dL (ref 70–99)
Potassium: 4.1 mmol/L (ref 3.5–5.1)
Sodium: 134 mmol/L — ABNORMAL LOW (ref 135–145)
Total Bilirubin: 0.4 mg/dL (ref 0.3–1.2)
Total Protein: 5.8 g/dL — ABNORMAL LOW (ref 6.5–8.1)

## 2022-04-28 LAB — MAGNESIUM: Magnesium: 2 mg/dL (ref 1.7–2.4)

## 2022-04-28 MED ORDER — APIXABAN 2.5 MG PO TABS: 2.5000 mg | ORAL_TABLET | Freq: Two times a day (BID) | ORAL | 0 refills | Status: DC

## 2022-04-28 MED ORDER — ACETAMINOPHEN 325 MG PO TABS: 650.0000 mg | ORAL_TABLET | Freq: Four times a day (QID) | ORAL | Status: DC

## 2022-04-28 MED ORDER — ATORVASTATIN CALCIUM 80 MG PO TABS: 80.0000 mg | ORAL_TABLET | Freq: Every day | ORAL | 0 refills | Status: DC

## 2022-04-28 MED ORDER — METOPROLOL SUCCINATE ER 25 MG PO TB24: 12.5000 mg | ORAL_TABLET | Freq: Three times a day (TID) | ORAL | 0 refills | Status: DC

## 2022-04-28 MED ORDER — MAGNESIUM OXIDE -MG SUPPLEMENT 400 (240 MG) MG PO TABS: 400.0000 mg | ORAL_TABLET | Freq: Two times a day (BID) | ORAL | 0 refills | Status: DC

## 2022-04-28 MED ORDER — FUROSEMIDE 20 MG PO TABS: 20.0000 mg | ORAL_TABLET | Freq: Every day | ORAL | 12 refills | Status: DC

## 2022-04-28 MED ORDER — GABAPENTIN 100 MG PO CAPS: 100.0000 mg | ORAL_CAPSULE | Freq: Two times a day (BID) | ORAL | 0 refills | Status: DC

## 2022-04-28 MED ORDER — AMLODIPINE BESYLATE 5 MG PO TABS: 5.0000 mg | ORAL_TABLET | Freq: Two times a day (BID) | ORAL | 0 refills | Status: DC

## 2022-04-28 MED ORDER — POTASSIUM CHLORIDE CRYS ER 20 MEQ PO TBCR: 20.0000 meq | EXTENDED_RELEASE_TABLET | Freq: Every day | ORAL | 0 refills | Status: DC

## 2022-04-28 MED ORDER — DICLOFENAC SODIUM 1 % EX GEL: 4.0000 g | Freq: Four times a day (QID) | CUTANEOUS | 0 refills | Status: DC

## 2022-04-28 NOTE — Progress Notes (Signed)
Inpatient Rehabilitation Discharge Medication Review by a Pharmacist  A complete drug regimen review was completed for this patient to identify any potential clinically significant medication issues.   High Risk Drug Classes Is patient taking? Indication by Medication  Antipsychotic No   Anticoagulant Yes Apixaban for atrial fibrillation-dose CHANGED to 2.5 mg BID.  Antibiotic No    Opioid No    Antiplatelet No    Hypoglycemics/insulin No    Vasoactive Medication Yes Amlodipine, metoprolol succinate, furosemide- hypertension  Chemotherapy No    Other Yes Acetaminophen, diclofenac topical gel  for pain Atorvastatin- HLD Bupropion - depression Buspirone for anxiety Eszopiclone- for sleep Flonase nasal spray-nasal congestions/allergies Gabapentin- pain Melatonin-prn sleep Iron, magnesium, potassium for supplement  Albuterol prn for shortness of breath/ wheezing Melatonin for sleep Lomotil- prn diarrhea or loose stools Probiotic (Align(-gut health        Type of Medication Issue Identified Description of Issue Recommendation(s)  Drug Interaction(s) (clinically significant)        Duplicate Therapy        Allergy        No Medication Administration End Date        Incorrect Dose        Additional Drug Therapy Needed        Significant med changes from prior encounter (inform family/care partners about these prior to discharge). Clonidine, diclofenac PO , pantoprazole were discontinued.  NEW apixaban-dose reduced.   Levothyroxine- discontinued (reported as not taking PTA)   Other            Clinically significant medication issues were identified that warrant physician communication and completion of prescribed/recommended actions by midnight of the next day:  No   Name of provider notified for urgent issues identified:    Provider Method of Notification:      Pharmacist comments:    Time spent performing this drug regimen review (minutes):  Mitchell,  [place] Clinical Pharmacist   04/28/2022 12:36 PM

## 2022-04-28 NOTE — Progress Notes (Signed)
Patient daughter came to pick and packed patient for discharge. Patient was provided discharge instructions from PA [facility]. Patient was wheeled out in wheelchair.  Sanda Linger, RN

## 2022-04-28 NOTE — Progress Notes (Signed)
PROGRESS NOTE   Subjective/Complaints:  Fall this AM off side of bed onto floor, found seated next to bed on pillow. On exam, no c/o pain, no change in neurologic exam.  Labs, head CT normal. Likely hospital acquired delirium to improve with return home. Family agreeable to continue DC today.   ROS: +Joint pain - b/l calf - improved, +insomnia,Denies fevers, chills, N/V, abdominal pain, SOB, cough, chest pain, new weakness or paraesthesias.    Objective:   CT HEAD WO CONTRAST (5MM)  Result Date: 04/28/2022 CLINICAL DATA:  Fall EXAM: CT HEAD WITHOUT CONTRAST TECHNIQUE: Contiguous axial images were obtained from the base of the skull through the vertex without intravenous contrast. RADIATION DOSE REDUCTION: This exam was performed according to the departmental dose-optimization program which includes automated exposure control, adjustment of the mA and/or kV according to patient size and/or use of iterative reconstruction technique. COMPARISON:  Head CT 04/16/2022, brain MRI 04/18/2018 FINDINGS: Brain: There is no acute intracranial hemorrhage or extra-axial fluid collection. There is hypodensity in the right temporal lobe consistent with evolving infarct which was acute on the MRI from 04/17/2022. There is additional smaller revolving infarct in the left aspect of the corpus callosum, also acute on the MRI. There is no new acute territorial infarct or evidence of hemorrhagic transformation. Encephalomalacia in the right anterior frontal lobe is unchanged. Smaller additional remote infarcts in the right basal ganglia, left cerebellar hemisphere, and left thalamus are unchanged. Additional confluent hypodensity in the supratentorial white matter consistent with underlying chronic small-vessel ischemic change is stable. The pituitary and suprasellar region are normal. There is no mass lesion. There is no mass effect or midline shift. Vascular: There  is calcification of the bilateral carotid siphons and vertebral arteries. Skull: Normal. Negative for fracture or focal lesion. Sinuses/Orbits: There is chronic left sphenoid sinusitis with hyperdense material which may reflect inspissated secretions and/or fungal colonization, unchanged. Bilateral lens implants are in place. The globes and orbits are otherwise unremarkable. Other: None. IMPRESSION: 1. No new acute intracranial pathology. 2. Evolving infarcts in the right temporal lobe and corpus callosum, acute on the MRI from 04/17/2022. Electronically Signed   By: Valetta Mole M.D.   On: 04/28/2022 09:47   Recent Labs    04/26/22 0909 04/28/22 0540  WBC 8.0 6.5  HGB 10.8* 9.7*  HCT 33.8* 30.2*  PLT 365 324    Recent Labs    04/27/22 0556 04/28/22 0540  NA 136 134*  K 3.8 4.1  CL 103 103  CO2 24 23  GLUCOSE 112* 81  BUN 16 14  CREATININE 1.22* 1.17*  CALCIUM 8.9 9.2     Intake/Output Summary (Last 24 hours) at 04/28/2022 1122 Last data filed at 04/28/2022 0900 Gross per 24 hour  Intake 397 ml  Output 0 ml  Net 397 ml         Physical Exam: Vital Signs Blood pressure 120/79, pulse 71, temperature (!) 97.4 F (36.3 C), resp. rate 20, height 4' 11"$  (1.499 m), weight 60.3 kg, SpO2 97 %.  Constitutional: No apparent distress, laying in bed, irritable. Appropriate appearance for age.  HENT: Trachea midline. Atraumatic, normocephalic. +HOH Eyes: PERRLA. EOMI. No  obvious nystagmus but pt uncooperative.  Cardiovascular: irregularly irregular, no murmurs/rub/gallops. trace BLE edema - improved.   Respiratory: CTAB. No rales, rhonchi, or wheezing. On RA.  Abdomen: + bowel sounds, normoactive. No distention or apparent tenderness.  Skin: Mild ecchymosis on R hand, R inner thigh, and L outter thigh Otherwise c/d/I.  MSK:    Antigravity and against resistance in all 4 extremities. No deformities.    Neuro: AAO to self only. + Mild left facial droop + mild dysarthria -  resolved Decreased to light touch sensation on left side - resolved + L mild hemineglect  - resolved   Assessment/Plan: 1. Functional deficits which require 3+ hours per day of interdisciplinary therapy in a comprehensive inpatient rehab setting. Physiatrist is providing close team supervision and 24 hour management of active medical problems listed below. Physiatrist and rehab team continue to assess barriers to discharge/monitor patient progress toward functional and medical goals  Care Tool:  Bathing    Body parts bathed by patient: Right arm, Left arm, Chest, Abdomen, Front perineal area, Right upper leg, Left upper leg, Face, Buttocks   Body parts bathed by helper: Right lower leg, Left lower leg     Bathing assist Assist Level: Minimal Assistance - Patient > 75%     Upper Body Dressing/Undressing Upper body dressing   What is the patient wearing?: Pull over shirt    Upper body assist Assist Level: Supervision/Verbal cueing    Lower Body Dressing/Undressing Lower body dressing      What is the patient wearing?: Pants     Lower body assist Assist for lower body dressing: Minimal Assistance - Patient > 75%     Toileting Toileting    Toileting assist Assist for toileting: Minimal Assistance - Patient > 75%     Transfers Chair/bed transfer  Transfers assist     Chair/bed transfer assist level: Contact Guard/Touching assist     Locomotion Ambulation   Ambulation assist      Assist level: Contact Guard/Touching assist Assistive device: Walker-rolling Max distance: 150'   Walk 10 feet activity   Assist     Assist level: Contact Guard/Touching assist Assistive device: Walker-rolling   Walk 50 feet activity   Assist    Assist level: Contact Guard/Touching assist Assistive device: Walker-rolling    Walk 150 feet activity   Assist Walk 150 feet activity did not occur: Safety/medical concerns  Assist level: Contact Guard/Touching  assist Assistive device: Walker-rolling    Walk 10 feet on uneven surface  activity   Assist     Assist level: Minimal Assistance - Patient > 75% Assistive device: Walker-rolling   Wheelchair     Assist Is the patient using a wheelchair?: No (Patient was unable to propel manual wheelchair secondary to arthritis in B UE causing pain.) Type of Wheelchair: Manual    Wheelchair assist level: Dependent - Patient 0% Max wheelchair distance: Therapist propelled manual wheelchair 150' for energy conservation as patient presents with poor endurance/activity tolerance    Wheelchair 50 feet with 2 turns activity    Assist        Assist Level: Dependent - Patient 0%   Wheelchair 150 feet activity     Assist      Assist Level: Dependent - Patient 0%   Blood pressure 120/79, pulse 71, temperature (!) 97.4 F (36.3 C), resp. rate 20, height 4' 11"$  (1.499 m), weight 60.3 kg, SpO2 97 %.  Medical Problem List and Plan: 1. Functional deficits secondary to R  MCA strokes with L hemiparesis and L neglect             -patient may  shower if cover R groin             -ELOS/Goals: 12-16 days min A to supervision; Dc to independent living 2/15  Stable for Dc today 2.  Antithrombotics: -DVT/anticoagulation:  Pharmaceutical: Eliquis 55m BID--resumed 12/05 -> reduced to 2.5 mg BID per weight dosing 2/13             -antiplatelet therapy: N/A 3. Pain Management:  Tylenol [id number]   - 2/7: voltaren gel QID to R foot and L hand -04/24/22 family brought aspercreme, requesting tylenol scheduled, ordered [id number]  - 2/12: started gabapentin 100 mg BID for calf pain, ? D/t peripheral neuropathy - improved  4. Mood/Behavior/Sleep:  LCSW to follow for evaluation and support.              -antipsychotic agents: N/A -see below #18  5. Neuropsych/cognition: This patient is not capable of making decisions on his  own behalf. --had mild cognitive impairments at baseline. Will add delirum  precautions - had 24/7 caregivers at home  6. Skin/Wound Care: Routine pressure relief measures 7. Fluids/Electrolytes/Nutrition: Monitor I/O. Monitor weekly labs, next 04/26/22 -Mg and K low on intake labs; replete 40 meq BID K; was already on Mag 400 mg dial (200 mg BID); d/t diarrhea will try IV repletion today and repeat labs 2/9 - Mg 1.7 today. Increase PO mag to 400 mg BID d/t repeated lows/borderline low, repeat in AM labs 2/9  - 2/9: repeat stable 1.7; continue and repeat Monday -04/24/22 Mg 1.6, but fairly stable, cont MagOx 400m8mD, monitor next on 04/26/22 (see #14 below) - 2/12: decreased K/Lasix to once a day. Mg IV for repletion. Repeat labs 2/13 stable K, Mag normal.   8. Chronic diastolic HF/global hypokinesis: EF-45-50% with severe concentric LVH --Monitor weights daily and for signs of overload --Lasix 20mg27m, metoprolol 12.5mg q59m Lipitor 80mg Q14m/7: LE edema stable; monitor weights -2/9: RLE > LLE edema; Duplex ordered RLE; discussed w/ nursing obtaining daily weights -04/24/22 no weight done today, ordered for daily weights, monitor. LE swelling seems better; Duplex U/S negative for DVT, showing likely hematoma superficial to R common femoral artery -04/25/22 wt down, cont to trend - 2/12: Decreasing lasix to once daily d/t aki; continue to trend  -weight downtrending Filed Weights   04/25/22 0600 04/26/22 0550 04/28/22 0500  Weight: 60.5 kg 59.8 kg 60.3 kg    9. Covid 11/23/Hypoxia: At baseline had SOB/DOE w/activity--few steps -- Add flutter valve and encourage pulmonary hygiene.  --has been requiring NRB for activity  10. A fib: Monitor HR TID--on metoprolol and eliquis 11.  HTN: Monitor BP TID--on Metoprolol, Clonidine, norvasc  - 2/9: Increase norvasc from 2.5 mg BID to 5 mg bid  -04/25/22 BPs stabilizing, cont regimen, monitor  2/12: DC lasix to daily - stable 2/13 Vitals:   04/26/22 0541 04/26/22 0911 04/26/22 1300 04/26/22 1948  BP: (!) 161/90 (!) 158/88  (!) 123/90 131/76   04/27/22 0613 04/27/22 1325 04/27/22 1932 04/27/22 1934  BP: 125/79 (!) 113/56 (!) 142/107 122/75   04/27/22 2206 04/28/22 0504 04/28/22 0820 04/28/22 0830  BP: 134/78 (!) 144/97 120/79 120/79      12. Acute on chronic renal failure:  - Cr stable 1.0; monitor on weekly labs starting 04/26/22 - Cr 1.3 today; decrease lasix, add gentle IVF 75/hr tonight and recheck  in AM -> cr 1.2, mildly improved  13.  Persistent hypokalemia: Supplement today-- increased to 40 meq BID 2/7--> improved to 3.7; repeat Monday 04/26/22  - Decrease to once daily supplement with once daily lasix 2/12 - stable  14. Hypomagnesemia: Mg- 1.6 --will supplement today - increased to 400 mg daily + IV --> 400 mg BID 2/8; stable 1.7, continue and repeat Monday -04/24/22 Mg 1.6, relatively stable, cont MagOx and f/up with labs Mon 04/26/22 - 2/11: Mag still 1/6, IV repletion today, recheck in AM -> 2.1 2/13  15. Acute on chronic anemia/Groin hematoma: Hgb 11.1--->9.3 - stable             --has had melanotic stools past month and referred to Dr. Benson Norway   - On iron supplement  16. Left breast cancer: Has declined surgery and stop Fulvestrant due to SE.  17. H/o chronic Diarrhea: Liquid stools documented-->d/c colace and miralax.  --PTA felt to be anxiety related.  - Switch iron to every other day for improved absorption, decreased s/e - Tolerating PO magnesium - Consider immodium if worsening  - LBM 2/12   18. Chronic insomnia: Used Lunesta 3 mg followed by Melatonin 10 mg every night per son.   -sleep wake chart. Will add melatonin 10 mg/hs  - 2/8: Start Melatonin 5 mg + Ambien 5 mg QHS PRN per formulary - 2/9: Confusion overnight and this AM, poor sleep, ?d/t ambien. Will DC medication, discuss w/ son today bringing in [place] since not on formulary -04/24/22 ordered melatonin 50m since it was still ordered as 52m added Trazodone 2541mHS PRN; family brought Lunesta from home, order placed for this to  be allowed to be given (3mg53monitor -04/25/22 pt sleepy this morning, very irritable, appears similar to what happened with Ambien the other day-- suspect Lunesta at play-- decreased to 1.5mg 54m. Monitor closely, may need further adjustments to mood/sleep meds--no acute neuro deficits appreciated, doubt acute stroke or need for emergent work up today 2/12: Continue Lunesta, improved AM arousal with lower dose.    19. Anxiety/depression: Back on Wellbutrin 300mg 43mith buspar 5mg [id number].   20. Urinary retention?: Will order PVR/bladder scans.- hadn't voided this AM - resolved - PVRs not performed; incontinent intermittent voids, will wait for PVRs before treating - 2/7, 2/8 - 1x PVR 244 on 2/9; mildly elevated, will hold off on starting medications for urgency -2/12: [place] PRN, PVR remain low.  2/13: no changes today     LOS: 8 days A FACE TO FACE EVALUATION WAS PERFORMED  [name] Gertie [id number], 11:22 AM

## 2022-04-28 NOTE — Progress Notes (Signed)
Patient presents with order for Urinalysis before discharge. Patient was unsuccessful in urinating. Nurse informed daughter and patient to increase fluid intake so as to urinate. Daughter expressed that she did not want to wait for UA and that she wanted to leave. PA Pam love informed. Sanda Linger, RN

## 2022-04-28 NOTE — Progress Notes (Signed)
Patient ID: [name], female   DOB: 06-19-1925, 89 y.o.   MRN: [id number]  SW received updates from [name]/[name] reporting unable to take referral as they do not service [place].   SW sent HHPT/OT/SLP referral to [name]/[name] Young and waitinf g on follow-up/   SW spoke [name]/[name] to follow-up to see If they are able to accept referral. She wanted to confirm reason for SLP. SW informed for cognition. Reports she will follow-up after speaking with director.  *SW received phone call that unable to offer SLP, but pt can have outpatient SLP once discharged from therapies. SW will follow-up after speaking with family.   SW met with pt dtr [name] and she would like to go to with [name] since they offer speech therapy.   1210-SW called [name]/[name] to inform family declined.   SW informed [name]/[name] Young on accepting referral, and to follow-up with pt dtr [name].   [name], MSW, [place] Office: [phone] Cell: [phone] Fax: [phone]

## 2022-04-28 NOTE — Progress Notes (Signed)
04/28/22 0820  What Happened  Was fall witnessed? No  Was patient injured? Unsure  Patient found on floor  Found by Staff-comment  Stated prior activity other (comment) (patient in bed)  Provider Notification  Provider Name/Title Algis Liming PA  Date Provider Notified 04/28/22  Time Provider Notified [phone]  Method of Notification Call  Notification Reason Fall  Provider response See new orders  Date of Provider Response 04/28/22  Time of Provider Response 0840  Follow Up  Family notified Yes - comment (left message for family to call facility)  Time family notified 0849  Additional tests No  Simple treatment Other (comment) (bed alarm on lower setting)  Progress note created (see row info) Yes  Adult Fall Risk Assessment  Risk Factor Category (scoring not indicated) Fall has occurred during this admission (document High fall risk)  Age 89  Fall History: Fall within 6 months prior to admission 5  Elimination; Bowel and/or Urine Incontinence 2  Elimination; Bowel and/or Urine Urgency/Frequency 0  Medications: includes PCA/Opiates, Anti-convulsants, Anti-hypertensives, Diuretics, Hypnotics, Laxatives, Sedatives, and Psychotropics 5  Patient Care Equipment 0  Mobility-Assistance 2  Mobility-Gait 2  Mobility-Sensory Deficit 2  Altered awareness of immediate physical environment 1  Impulsiveness 2  Lack of understanding of one's physical/cognitive limitations 4  Total Score 28  Patient Fall Risk Level High fall risk  Adult Fall Risk Interventions  Required Bundle Interventions *See Row Information* High fall risk - low, moderate, and high requirements implemented  Additional Interventions Lap belt while in chair/wheelchair (Rehab only);PT/OT need assessed if change in mobility from baseline;Reorient/diversional activities with confused patients;Use of appropriate toileting equipment (bedpan, BSC, etc.)  Screening for Fall Injury Risk (To be completed on HIGH fall risk patients) -  Assessing Need for Floor Mats  Risk For Fall Injury- Criteria for Floor Mats 85 years or older;Confusion/dementia (+NuDESC, CIWA, TBI, etc.);Noncompliant with safety precautions  Will Implement Floor Mats Yes  Vitals  Temp (!) 97.4 F (36.3 C)  Temp Source Oral  BP 120/79  MAP (mmHg) 94  BP Location Right Arm  BP Method Automatic  Patient Position (if appropriate) Lying  Pulse Rate 71  Pulse Rate Source Monitor  Resp 20  Oxygen Therapy  SpO2 97 %  O2 Device Room Air  Pain Assessment  Pain Scale 0-10  Pain Score 0  Neurological  Neuro (WDL) X  Level of Consciousness Alert  Orientation Level Oriented to person;Oriented to place  Cognition Impulsive;Poor safety awareness;Memory impairment  Speech Clear  R Pupil Size (mm) 3  R Pupil Shape Round  R Pupil Reaction Brisk  L Pupil Size (mm) 3  L Pupil Shape Round  L Pupil Reaction Brisk  R Hand Grip Weak  L Hand Grip Weak   RUE Motor Response Purposeful movement  RUE Sensation Full sensation  RUE Motor Strength 4  LUE Motor Response Purposeful movement  LUE Sensation Full sensation  LUE Motor Strength 4  RLE Motor Response Purposeful movement  RLE Sensation Full sensation  RLE Motor Strength 4  LLE Motor Response Purposeful movement  LLE Sensation Full sensation  LLE Motor Strength 4  Neuro Symptoms Forgetful  Neuro symptoms relieved by Other (Comment) (redirection)  Glasgow Coma Scale  Eye Opening 4  Best Verbal Response (NON-intubated) 4  Best Motor Response 6  Glasgow Coma Scale Score 14  Musculoskeletal  Musculoskeletal (WDL) X  Assistive Device Wheelchair;Front wheel walker  Generalized Weakness Yes  Weight Bearing Restrictions No  Musculoskeletal Details  RUE Full  movement;Weakness  LUE Full movement;Weakness  RLE Full movement;Weakness  LLE Full movement;Weakness  Integumentary  Integumentary (WDL) X  Skin Color Appropriate for ethnicity  Skin Condition Dry  Skin Integrity Ecchymosis  Ecchymosis  Location Arm;Hand  Ecchymosis Location Orientation Bilateral  Skin Turgor Non-tenting

## 2022-04-28 NOTE — Progress Notes (Signed)
Speech Language Pathology Discharge Summary  Patient Details  Name: [name] MRN: [id number] Date of Birth: [birth year]  Date of Discharge from [place] service:April 27, 2022  Patient has met 5 of 8 long term goals.  Patient to discharge at [facility] level.   Reasons goals not met: Patient continues to require Mod-Max A multimodal cues for functional problem solving, recall, and awareness. Patient with limited motivation/desire to participate in treatment sessions, therefore, patient is discharging home earlier than anticipated per family decision resulting in minimal changes in her overall functional independence.    Clinical Impression/Discharge Summary: Patient has made slow and inconsistent gains and has met 5 of 8 LTGs this reporting period. Currently, patient requires overall Min A verbal cues for orientation with use of external aids and sustained attention to functional tasks. Overall Mod-Max A verbal cues are needed for functional problem solving, recall and awareness during functional and familiar tasks. Patient's overall verbal expression and auditory comprehension appear [facility] for tasks assessed. Patient with limited motivation/desire to participate in treatment sessions, therefore, patient is discharging home earlier than anticipated per family decision resulting in minimal changes in her overall functional independence. Education is complete and patient will discharge home with 24 hour supervision. Patient would benefit from f/u SLP services to maximize her cognitive functioning and overall functional independence in order to reduce caregiver burden.   Care Partner:  Caregiver Able to Provide Assistance: Yes  Type of Caregiver Assistance: Physical;Cognitive  Recommendation:  Home Health SLP;24 hour supervision/assistance  Rationale for SLP Follow Up: Maximize cognitive function and independence   Equipment:     Reasons for discharge: Discharged from hospital    Patient/Family Agrees with Progress Made and Goals Achieved: Yes    [place], [place] 04/28/2022, 6:56 AM

## 2022-04-28 NOTE — Progress Notes (Signed)
Patient agitated and non cooperative with staff when trying to clean her this morning/change her brief. Being difficult and using insulting words toward one of the staff. After redirecting the patient and calmly talking to her ,she agree for the nurse to put her clothes back on. She also requested to go to the bathroom. Patient calmly laying in bed with non complaint at this time.

## 2022-04-28 NOTE — Progress Notes (Signed)
Inpatient Rehabilitation Care Coordinator Discharge Note   Patient Details  Name: [name] MRN: [id number] Date of Birth: 1925/04/07   Discharge location: D/c- return to [place] ([place])  Length of Stay: 7 days  Discharge activity level: Min Asst  Home/community participation: Limited  Patient response [id number] Literacy - How often do you need to have someone help you when you read instructions, pamphlets, or other written material from your doctor or pharmacy?: Never  Patient response [id number] Isolation - How often do you feel lonely or isolated from those around you?: Never  Services provided included: MD, RD, PT, OT, SLP, RN, Neuropsych, SW, Pharmacy, TR, CM  Financial Services:  Charity fundraiser Utilized: Medicare    Choices offered to/list presented to: pt dtr  Follow-up services arranged:  [place]: [facility] for HHPT/OT/SLP   Patient response to transportation need: Is the patient able to respond to transportation needs?: Yes In the past 12 months, has lack of transportation kept you from medical appointments or from getting medications?: No In the past 12 months, has lack of transportation kept you from meetings, work, or from getting things needed for daily living?: No   Comments (or additional information):  Patient/Family verbalized understanding of follow-up arrangements:  Yes  Individual responsible for coordination of the follow-up plan: contact pt dtr Cyndi#[phone]  Confirmed correct DME delivered: Rana Snare 04/28/2022    Rana Snare

## 2022-04-28 NOTE — Plan of Care (Signed)
  Problem: RH Problem Solving Goal: LTG Patient will demonstrate problem solving for (SLP) Description: LTG:  Patient will demonstrate problem solving for basic/complex daily situations with cues  (SLP) Outcome: Not Met (add Reason) Note: Patient requires Mod-Max A verbal cues    Problem: RH Memory Goal: LTG Patient will use memory compensatory aids to (SLP) Description: LTG:  Patient will use memory compensatory aids to recall biographical/new, daily complex information with cues (SLP) Outcome: Not Met (add Reason) Note: Patient requires Mod-Max A verbal cues    Problem: RH Awareness Goal: LTG: Patient will demonstrate awareness during functional activites type of (SLP) Description: LTG: Patient will demonstrate awareness during functional activites type of (SLP) Outcome: Not Met (add Reason) Note: Patient requires Mod-Max A verbal cues    Problem: RH Cognition - SLP Goal: RH LTG Patient will demonstrate orientation with cues Description:  LTG:  Patient will demonstrate orientation to person/place/time/situation with cues (SLP)   Outcome: Completed/Met   Problem: RH Comprehension Communication Goal: LTG Patient will comprehend basic/complex auditory (SLP) Description: LTG: Patient will comprehend basic/complex auditory information with cues (SLP). Outcome: Completed/Met   Problem: RH Expression Communication Goal: LTG Patient will express needs/wants via multi-modal(SLP) Description: LTG:  Patient will express needs/wants via multi-modal communication (gestures/written, etc) with cues (SLP) Outcome: Completed/Met Goal: LTG Patient will verbally express basic/complex needs(SLP) Description: LTG:  Patient will verbally express basic/complex needs, wants or ideas with cues  (SLP) Outcome: Completed/Met   Problem: RH Attention Goal: LTG Patient will demonstrate this level of attention during functional activites (SLP) Description: LTG:  Patient will will demonstrate this level of  attention during functional activites (SLP) Outcome: Completed/Met

## 2022-04-28 NOTE — Progress Notes (Signed)
Completed and closed out nursing education and nursing care plan for discharge.

## 2022-05-06 ENCOUNTER — Encounter: Payer: Self-pay | Admitting: Internal Medicine

## 2022-05-06 ENCOUNTER — Ambulatory Visit: Payer: Medicare Other | Admitting: Internal Medicine

## 2022-05-06 VITALS — BP 120/70 | HR 76 | Temp 97.2°F | Resp 16 | Ht 59.0 in | Wt 137.6 lb

## 2022-05-06 DIAGNOSIS — I480 Paroxysmal atrial fibrillation: Secondary | ICD-10-CM

## 2022-05-06 DIAGNOSIS — I63419 Cerebral infarction due to embolism of unspecified middle cerebral artery: Secondary | ICD-10-CM | POA: Insufficient documentation

## 2022-05-06 NOTE — Assessment & Plan Note (Signed)
Hospice is planning to wean her eliquis.  We will continue with palliative care.

## 2022-05-06 NOTE — Progress Notes (Signed)
Office Visit  Subjective   Patient ID: [name]   DOB: Aug 02, 1925   Age: 89 y.o.   MRN: [id number]   Chief Complaint Chief Complaint  Patient presents with   Hospitalization Follow-up     History of Present Illness [name] returns today for a hospital followup where she was admitted to [facility] on 04/16/2022 where she had acute left-sided weakness, difficulty speaking and right gaze preference.  They performed a CTA head/neck showed right-MCA M1 occlusion with incidental multiple thyroid nodules.  TNK was administered and she underwent cerebral angio with thrombectomy.  She had a MRI brain done revealing acute infarct in right temporal lobe, body of corpus callosum, right corona radiata and left centrum ovale with scattered area of susceptibility question microhemorrhages.  Her stroke was felt to be embolic due to A-fib and she was started on low-dose Eliquis.  2D echo showed EF of 40 to 45% with global hypokinesis.  She did have some issues with insomnia as well as DOE but respiratory status improved.  She continued to be limited by right foot pain, left-sided weakness, cognitive deficits requiring increased time for simple commands, difficulty with sequencing and was limited by hearing loss.  The patient was sent to inpatient rehab where she stayed for 8 days and was discharged from inpatient on 04/28/2022.  She received PT, ST and OT.  While at rehab, a follow-up CBC has shown H&H to be relatively stable and no signs of rectal bleeding reported.  She has made steady gains during his stay and currently requires min assist overall.  She has had issues with bilateral joint which has been treated with local measures as well as scheduled Tylenol TID.  BLE dopplers done due to calf pain and was negative for DVT.  She was found on the floor on the day of discharge and repeat labs as well as CT head were negative.  They did setup  home health PT, ST and OT by [facility] after  discharge but the family has setup for hospice.  Homehealth is therefore not involved.  Today, they state she is doing a bit better with her ambulatory dysfunction.  She is taking some steps with a rolling walker in her home.  I did see [name] on 03/19/2022 for a hospital followup where she was admitted to [facility] from 03/02/2022 until 03/03/2022.  She presented to the ER at that time with increasing SOB which she had felt was residual from where was diagnosed with COVID-19 on 02/08/2022.  We had seen her in the office and started her on antibiotics but she did worsen.  In the ED, she was noted to have new onset A. Fib.  They obtained a CT scan of her chest that showed coronary calcifications and reflux of contrast into the IVC and hepatic veins which was compatible with right sided heart failure.  There were also bilateral pleural effusions.  She was admitted to the hospital and started on IV diuresis.  An ECHO was obtained that showed a normal LV function with an EF of 55-60% with possible diastolic dysfunction and mild TR.  Her CHADVASC score was a 4-5 and cardiology was consulted.  They recommended anticoagulation but she was noted to be anemic at 7.7 and the family wanted to talk to her cardiologist in [place] before starting blood thinners.  She was noted to have iron deficiency anemia and was started on oral iron.  There was no obvious bleeding.  They increased her toprolol XL 58m daily to BID dosing.  They also sent her home on low dose lasix 220mdaily.  She was not hypoxic during her hospitalization and did not require oxygen.  I had a discussion with her about anticoagulation during her visit on 03/19/2022 but the patient was not interested in anticoagulation at that time.  Today, she states she has occasional palpitations and some edema but no chest pain.  She is on iron and she states she does have black stools.           Past Medical History Past Medical History:  Diagnosis Date   Abnormal  gait 06/24/2015   Last Assessment & Plan:   Relevant Hx:  Course:  Daily Update:  Today's Plan:uses her cane for this and no recent falls     Electronically signed by: MeMayer CamelNP  07/23/15 1529   Acute cough 08/25/2011   Acute kidney injury superimposed on chronic kidney disease (HCE[place]08/08/2020   Anemia due to stage 3 chronic kidney disease (HCBelfonte04/01/2016   Last Assessment & Plan: Formatting of this note might be different from the original. Relevant Hx: Course: Daily Update: Today's Plan:update her CMP for her Electronically signed by: MeMayer CamelNP 07/23/15 1528 Formatting of this note might be different from the original. Last Assessment & Plan: Relevant Hx: Course: Daily Update: Today's Plan:update her CMP for her Electronically    Anxiety 06/24/2015   Last Assessment & Plan:   Relevant Hx:  Course:  Daily Update:  Today's Plan:this is stable for her at this time     Electronically signed by: MeMayer CamelNP  07/23/15 1529   Aortic stenosis, severe    a. 07/2017: s/p TAVR w/ an Edwards Sapien 3 THV (size 26 mm, model # [id number] # [id number]  Atherosclerosis of coronary artery 06/24/2015   Formatting of this note might be different from the original. Ostial RCA DES, 2010 Last Assessment & Plan: stable Last Assessment & Plan: Relevant Hx: Course: Daily Update: Today's Plan:she has felt she was stable from this and she has cardiologist she has followed with . Electronically signed by: MeMayer CamelNP 07/23/15 1523 Ostial RCA DES, 2010 Last Assessment & Plan: stable   Atrial fibrillation (HCGrants[place]01/07/2022   Bilateral hearing loss 06/25/2015   Bilateral sensorineural hearing loss 12/26/2020   Breast cancer, right breast (HCPuyall[place]01/12/2008   CAD (coronary artery disease)    a. 2010: s/p stent to RCA   Chronic diastolic (congestive) heart failure (HCPen [place]05/23/2019   Chronic kidney disease, stage III (moderate) (HCC)    Chronic  recurrent major depressive disorder (HCMartins[place]04/01/2016   Last Assessment & Plan:   Relevant Hx:  Course:  Daily Update:  Today's Plan:this is stable for her     Electronically signed by: MeMayer CamelNP  07/23/15 1530   Chronic sinusitis    Coronary artery disease involving native coronary artery of native heart with angina pectoris (HCWhite04/01/2016   Formatting of this note might be different from the original. Overview: Ostial RCA DES, 2010 Last Assessment & Plan: stable Last Assessment & Plan: Formatting of this note might be different from the original. Relevant Hx: Course: Daily Update: Today's Plan:she has felt she was stable from this and she has cardiologist she has followed with . Electronically signed by: MeMayer CamelN   Coronary artery disease involving native coronary artery of native heart without angina pectoris  Ostial RCA DES, 2010   Decreased hearing of both ears 06/25/2015   Deficiency anemia 03/11/2021   Dehydration 10/19/2020   Depressive disorder    Dizziness 08/14/2018   [place] of macula of both eyes 09/12/2018   Ductal carcinoma in situ of breast 06/23/2007   Encounter for long-term current use of high risk medication 06/24/2015   Epiretinal membrane (ERM) of left eye 09/12/2018   Essential hypertension, benign 02/16/2013   Gastro-esophageal reflux disease with esophagitis 06/24/2015   Last Assessment & Plan:   Relevant Hx:  Course:  Daily Update:  Today's Plan:she is stable from this      Electronically signed by: Mayer Camel, NP  07/23/15 1526   Gastroesophageal reflux disease without esophagitis 12/27/2016   History of breast cancer    a. s/p lumpectomies and XRT   Hyperlipemia    Hyperlipidemia 02/16/2013   Hypertension    Hypothyroid    Hypothyroidism, acquired 06/24/2015   Last Assessment & Plan:   Relevant Hx:  Course:  Daily Update:  Today's Plan:update her TSH for her today     Electronically signed by: Mayer Camel, NP  07/23/15 1526   Incisional hernia, without obstruction or gangrene 02/24/2019   Insomnia    Intermediate stage nonexudative age-related macular degeneration of both eyes 01/19/2021   Irritation of oral cavity 11/23/2019   Malaise and fatigue 06/24/2015   Last Assessment & Plan:   Relevant Hx:  Course:  Daily Update:  Today's Plan:she is up and down with her energy and she feels that for the most part she has been stable     Electronically signed by: Mayer Camel, NP  07/23/15 1533   Malignant neoplasm of central portion of left female breast ([place]) 06/24/2015   Last Assessment & Plan:   Relevant Hx:  Course:  Daily Update:  Today's Plan:she is now receiving injections once a month for this and is hopeful will keep this at bay as this has been recurrent     Electronically signed by: Mayer Camel, NP  07/23/15 1530   Melena 03/19/2022   Memory change 07/23/2015   Last Assessment & Plan:   Relevant Hx:  Course:  Daily Update:  Today's Plan:she is going to take the aricept at supper     Electronically signed by: Mayer Camel, NP  07/23/15 1532   Memory loss    Multiple thyroid nodules 02/09/2012   Osteoarthritis    Osteoporosis 03/24/2020   Other allergic rhinitis 12/27/2016   Plantar fat pad atrophy 01/19/2016   Posterior vitreous detachment of left eye 09/12/2018   Primary insomnia 06/24/2015   Last Assessment & Plan:   Relevant Hx:  Course:  Daily Update:  Today's Plan:she feels that this is stable for her at this time     Electronically signed by: Mayer Camel, NP  07/23/15 1531   Primary localized osteoarthrosis, ankle and foot 01/15/2016   Primary osteoarthritis involving multiple joints 06/24/2015   Last Assessment & Plan:   Relevant Hx:  Course:  Daily Update:  Today's Plan:she is stable from her joint though she has discomfort if she overexerts.     Electronically signed by: Mayer Camel, NP   07/23/15 1528   Reflux esophagitis    S/P TAVR (transcatheter aortic valve replacement) 08/02/2017   Severe aortic stenosis 02/16/2013   Physician Review  Conclusions: 1. Mild concentric left ventricular hypertrophy.  2. Left ventricular ejection fraction estimated by 2D at 60-65 percent.  3. There were no regional wall motion abnormalities.  4. Mild mitral annular calcification.  5. Trace mitral valve regurgitation.  6. Trivial tricuspid regurgitation.  7. Moderate increased thickness and calcification of the trileaflet aortic val   Stage III chronic kidney disease ([place]) 06/24/2015   Last Assessment & Plan:   Relevant Hx:  Course:  Daily Update:  Today's Plan:update her CMP for her     Electronically signed by: Mayer Camel, NP  07/23/15 1528   Thyroid nodule 06/24/2015   Vitreous membranes and strands 01/19/2021   PPV on 02/11/2021 OS     Allergies Allergies  Allergen Reactions   Latex Hives and Swelling    Other reaction(s): Unknown   Lisinopril Cough    Other reaction(s): Unknown   Povidone Iodine Other (See Comments)    Blisters  Other reaction(s): Unknown   Povidone-Iodine     Other Reaction(s): Unknown     Medications  Current Outpatient Medications:    acetaminophen (TYLENOL) 325 MG tablet, Take 2 tablets (650 mg total) by mouth every 6 (six) hours., Disp: , Rfl:    albuterol (VENTOLIN HFA) 108 (90 Base) MCG/ACT inhaler, Inhale 2 puffs into the lungs every 4 (four) hours as needed for wheezing or shortness of breath., Disp: 1 each, Rfl: 12   amLODipine (NORVASC) 5 MG tablet, Take 1 tablet (5 mg total) by mouth 2 (two) times daily., Disp: 60 tablet, Rfl: 0   apixaban (ELIQUIS) 2.5 MG TABS tablet, Take 1 tablet (2.5 mg total) by mouth 2 (two) times daily., Disp: 60 tablet, Rfl: 0   atorvastatin (LIPITOR) 80 MG tablet, Take 1 tablet (80 mg total) by mouth daily., Disp: 30 tablet, Rfl: 0   buPROPion (WELLBUTRIN XL) 300 MG 24 hr tablet, Take 300 mg by mouth every  morning., Disp: , Rfl:    busPIRone (BUSPAR) 5 MG tablet, Take 1 tablet (5 mg total) by mouth 3 (three) times daily as needed (anxiety)., Disp: 30 tablet, Rfl: 0   diclofenac Sodium (VOLTAREN) 1 % GEL, Apply 4 g topically 4 (four) times daily., Disp: 350 g, Rfl: 0   diphenoxylate-atropine (LOMOTIL) 2.5-0.025 MG tablet, Take 1 tablet by mouth 4 (four) times daily as needed for diarrhea or loose stools., Disp: 30 tablet, Rfl: 0   Eszopiclone 3 MG TABS, Take 3 mg by mouth at bedtime., Disp: , Rfl:    FEROSUL 325 (65 Fe) MG tablet, Take 325 mg by mouth 2 (two) times daily., Disp: , Rfl:    fluticasone (FLONASE) 50 MCG/ACT nasal spray, Place 1 spray into both nostrils in the morning and at bedtime., Disp: 15.8 mL, Rfl: 0   furosemide (LASIX) 20 MG tablet, Take 1 tablet (20 mg total) by mouth daily., Disp: 60 tablet, Rfl: 12   gabapentin (NEURONTIN) 100 MG capsule, Take 1 capsule (100 mg total) by mouth 2 (two) times daily., Disp: 60 capsule, Rfl: 0   magnesium oxide (MAG-OX) 400 (240 Mg) MG tablet, Take 1 tablet (400 mg total) by mouth 2 (two) times daily., Disp: 60 tablet, Rfl: 0   melatonin 3 MG TABS tablet, Take 1 tablet (3 mg total) by mouth at bedtime as needed., Disp: , Rfl: 0   metoprolol succinate (TOPROL-XL) 25 MG 24 hr tablet, Take 0.5 tablets (12.5 mg total) by mouth every 8 (eight) hours., Disp: 90 tablet, Rfl: 0   potassium chloride SA (KLOR-CON M) 20 MEQ tablet, Take 1 tablet (20 mEq total) by mouth daily., Disp: 30 tablet, Rfl: 0  Probiotic Product (ALIGN PO), Take 1 capsule by mouth daily., Disp: , Rfl:    Review of Systems Review of Systems  Constitutional:  Negative for chills and fever.  Respiratory:  Negative for cough and shortness of breath.   Cardiovascular:  Positive for palpitations and leg swelling. Negative for chest pain.  Gastrointestinal:  Negative for abdominal pain, constipation, diarrhea, nausea and vomiting.  Musculoskeletal:  Positive for joint pain.   Neurological:  Positive for weakness. Negative for dizziness and headaches.       Objective:    Vitals BP 120/70   Pulse 76   Temp (!) 97.2 F (36.2 C)   Resp 16   Ht 4' 11"$  (1.499 m)   Wt 137 lb 9.6 oz (62.4 kg)   SpO2 97%   BMI 27.79 kg/m    Physical Examination Physical Exam Constitutional:      Appearance: Normal appearance. She is not ill-appearing.  Cardiovascular:     Rate and Rhythm: Normal rate and regular rhythm.     Pulses: Normal pulses.     Heart sounds: No murmur heard.    No friction rub. No gallop.  Pulmonary:     Effort: Pulmonary effort is normal. No respiratory distress.     Breath sounds: No wheezing, rhonchi or rales.  Abdominal:     General: Bowel sounds are normal. There is no distension.     Palpations: Abdomen is soft.     Tenderness: There is no abdominal tenderness.  Musculoskeletal:     Right lower leg: No edema.     Left lower leg: No edema.  Skin:    General: Skin is warm and dry.     Findings: No rash.  Neurological:     Mental Status: She is alert.        Assessment & Plan:   Cerebrovascular accident (CVA) due to embolism of middle cerebral artery ([place]) The patient had an embolic stroke from her A. Fib but she is now on hospice.  We will coordinate with hospice.  Atrial fibrillation ([place]) Hospice is planning to wean her eliquis.  We will continue with palliative care.    No follow-ups on file.   Townsend Roger, MD

## 2022-05-06 NOTE — Assessment & Plan Note (Signed)
The patient had an embolic stroke from her A. Fib but she is now on hospice.  We will coordinate with hospice.

## 2022-05-08 ENCOUNTER — Other Ambulatory Visit: Payer: Self-pay | Admitting: Internal Medicine

## 2022-05-12 ENCOUNTER — Encounter
Payer: Medicare Other | Attending: Physical Medicine and Rehabilitation | Admitting: Physical Medicine and Rehabilitation

## 2022-05-13 ENCOUNTER — Other Ambulatory Visit: Payer: Self-pay

## 2022-05-13 NOTE — Telephone Encounter (Signed)
Diclofenac has been discontinued. Buspar should still have refills

## 2022-05-18 NOTE — Progress Notes (Deleted)
Cardiology Office Note:    Date:  05/19/2022   ID:  [name], DOB [birth year], MRN [id number]  PCP:  Garwin Brothers, MD  Cardiologist:  Shirlee More, MD   Referring MD: Seward Meth, NP  ASSESSMENT:    1. Chronic combined systolic and diastolic heart failure ([place])   2. S/P TAVR (transcatheter aortic valve replacement)   3. Coronary artery disease involving native coronary artery of native heart without angina pectoris   4. Hypertensive heart disease with heart failure (HCC)   5. Stage 3 chronic kidney disease, unspecified whether stage 3a or 3b CKD ([place])   6. Anemia, unspecified type    PLAN:    In order of problems listed above:  ***  Next appointment   Medication Adjustments/Labs and Tests Ordered: Current medicines are reviewed at length with the patient today.  Concerns regarding medicines are outlined above.  No orders of the defined types were placed in this encounter.  No orders of the defined types were placed in this encounter.    No chief complaint on file. ***  History of Present Illness:    [name] is a 89 y.o. female with a history of CAD TAVR hypertension atrial fibrillation chronic kidney disease with recent right middle cerebral artery stroke who is being seen today for the evaluation of heart failure at the request of Seward Meth, NP.  There is a notation that she had been admitted to [facility] in December following COVID-19 infection and found to have congestive heart failure.  She was anemic in the hospital with a hemoglobin of 7.7 and decision was made by the family not to initiate anticoagulant therapy.  She was seen by Dr. Daneen Schick 05/13/2021 details include PCI of the right coronary artery in 2010 and TAVR 08/02/2017  During her  recent hospitalization she had an echocardiogram performed showing global hypokinesia severe LVH EF is reduced mildly 45 to 50% mildly reduced right ventricular systolic and a  small pericardial effusion.  She was found to be in atrial fibrillation she has a history of anticoagulant therapy with a apixaban.  She was not seen by cardiology during that admission.  During her stay she required mechanical ventilation and had thrombolytic therapy and mechanical thrombectomy as treatment for her stroke. Past Medical History:  Diagnosis Date   Abnormal gait 06/24/2015   Last Assessment & Plan:   Relevant Hx:  Course:  Daily Update:  Today's Plan:uses her cane for this and no recent falls     Electronically signed by: Mayer Camel, NP  07/23/15 1529   Acute cough 08/25/2011   Acute kidney injury superimposed on chronic kidney disease ([place]) 10/18/2020   Anemia due to stage 3 chronic kidney disease ([place]) 06/24/2015   Last Assessment & Plan: Formatting of this note might be different from the original. Relevant Hx: Course: Daily Update: Today's Plan:update her CMP for her Electronically signed by: Mayer Camel, NP 07/23/15 1528 Formatting of this note might be different from the original. Last Assessment & Plan: Relevant Hx: Course: Daily Update: Today's Plan:update her CMP for her Electronically    Anxiety 06/24/2015   Last Assessment & Plan:   Relevant Hx:  Course:  Daily Update:  Today's Plan:this is stable for her at this time     Electronically signed by: Mayer Camel, NP  07/23/15 1529   Aortic stenosis, severe    a. 07/2017: s/p TAVR w/ an Edwards Sapien 3 THV (size 26  mm, model # [id number], serial # [id number])   Atherosclerosis of coronary artery 06/24/2015   Formatting of this note might be different from the original. Ostial RCA DES, 2010 Last Assessment & Plan: stable Last Assessment & Plan: Relevant Hx: Course: Daily Update: Today's Plan:she has felt she was stable from this and she has cardiologist she has followed with . Electronically signed by: Mayer Camel, NP 07/23/15 1523 Ostial RCA DES, 2010 Last Assessment & Plan:  stable   Atrial fibrillation ([place]) 03/19/2022   Bilateral hearing loss 06/25/2015   Bilateral sensorineural hearing loss 12/26/2020   Breast cancer, right breast ([place]) 03/24/2008   CAD (coronary artery disease)    a. 2010: s/p stent to RCA   Chronic diastolic (congestive) heart failure ([place]) 08/04/2017   Chronic kidney disease, stage III (moderate) (HCC)    Chronic recurrent major depressive disorder ([place]) 06/24/2015   Last Assessment & Plan:   Relevant Hx:  Course:  Daily Update:  Today's Plan:this is stable for her     Electronically signed by: Mayer Camel, NP  07/23/15 1530   Chronic sinusitis    Coronary artery disease involving native coronary artery of native heart with angina pectoris ([place]) 06/24/2015   Formatting of this note might be different from the original. Overview: Ostial RCA DES, 2010 Last Assessment & Plan: stable Last Assessment & Plan: Formatting of this note might be different from the original. Relevant Hx: Course: Daily Update: Today's Plan:she has felt she was stable from this and she has cardiologist she has followed with . Electronically signed by: Mayer Camel, N   Coronary artery disease involving native coronary artery of native heart without angina pectoris    Ostial RCA DES, 2010   Decreased hearing of both ears 06/25/2015   Deficiency anemia 03/11/2021   Dehydration 10/19/2020   Depressive disorder    Dizziness 08/14/2018   [place] of macula of both eyes 09/12/2018   Ductal carcinoma in situ of breast 06/23/2007   Encounter for long-term current use of high risk medication 06/24/2015   Epiretinal membrane (ERM) of left eye 09/12/2018   Essential hypertension, benign 02/16/2013   Gastro-esophageal reflux disease with esophagitis 06/24/2015   Last Assessment & Plan:   Relevant Hx:  Course:  Daily Update:  Today's Plan:she is stable from this      Electronically signed by: Mayer Camel, NP  07/23/15 1526    Gastroesophageal reflux disease without esophagitis 12/27/2016   History of breast cancer    a. s/p lumpectomies and XRT   Hyperlipemia    Hyperlipidemia 02/16/2013   Hypertension    Hypothyroid    Hypothyroidism, acquired 06/24/2015   Last Assessment & Plan:   Relevant Hx:  Course:  Daily Update:  Today's Plan:update her TSH for her today     Electronically signed by: Mayer Camel, NP  07/23/15 1526   Incisional hernia, without obstruction or gangrene 02/24/2019   Insomnia    Intermediate stage nonexudative age-related macular degeneration of both eyes 01/19/2021   Irritation of oral cavity 11/23/2019   Malaise and fatigue 06/24/2015   Last Assessment & Plan:   Relevant Hx:  Course:  Daily Update:  Today's Plan:she is up and down with her energy and she feels that for the most part she has been stable     Electronically signed by: Mayer Camel, NP  07/23/15 1533   Malignant neoplasm of central portion of left female breast ([place]) 06/24/2015   Last Assessment &  Plan:   Relevant Hx:  Course:  Daily Update:  Today's Plan:she is now receiving injections once a month for this and is hopeful will keep this at bay as this has been recurrent     Electronically signed by: Mayer Camel, NP  07/23/15 1530   Melena 03/19/2022   Memory change 07/23/2015   Last Assessment & Plan:   Relevant Hx:  Course:  Daily Update:  Today's Plan:she is going to take the aricept at supper     Electronically signed by: Mayer Camel, NP  07/23/15 1532   Memory loss    Multiple thyroid nodules 02/09/2012   Osteoarthritis    Osteoporosis 03/24/2020   Other allergic rhinitis 12/27/2016   Plantar fat pad atrophy 01/19/2016   Posterior vitreous detachment of left eye 09/12/2018   Primary insomnia 06/24/2015   Last Assessment & Plan:   Relevant Hx:  Course:  Daily Update:  Today's Plan:she feels that this is stable for her at this time     Electronically signed by:  Mayer Camel, NP  07/23/15 1531   Primary localized osteoarthrosis, ankle and foot 01/15/2016   Primary osteoarthritis involving multiple joints 06/24/2015   Last Assessment & Plan:   Relevant Hx:  Course:  Daily Update:  Today's Plan:she is stable from her joint though she has discomfort if she overexerts.     Electronically signed by: Mayer Camel, NP  07/23/15 1528   Reflux esophagitis    S/P TAVR (transcatheter aortic valve replacement) 08/02/2017   Severe aortic stenosis 02/16/2013   Physician Review  Conclusions: 1. Mild concentric left ventricular hypertrophy.  2. Left ventricular ejection fraction estimated by 2D at 60-65 percent.  3. There were no regional wall motion abnormalities.  4. Mild mitral annular calcification.  5. Trace mitral valve regurgitation.  6. Trivial tricuspid regurgitation.  7. Moderate increased thickness and calcification of the trileaflet aortic val   Stage III chronic kidney disease ([place]) 06/24/2015   Last Assessment & Plan:   Relevant Hx:  Course:  Daily Update:  Today's Plan:update her CMP for her     Electronically signed by: Mayer Camel, NP  07/23/15 1528   Thyroid nodule 06/24/2015   Vitreous membranes and strands 01/19/2021   PPV on 02/11/2021 OS    Past Surgical History:  Procedure Laterality Date   ANGIOPLASTY     BIOPSY  10/21/2020   Procedure: BIOPSY;  Surgeon: Carol Ada, MD;  Location: [facility];  Service: Endoscopy;;   BREAST LUMPECTOMY     x2   ESOPHAGOGASTRODUODENOSCOPY Left 10/21/2020   Procedure: ESOPHAGOGASTRODUODENOSCOPY (EGD);  Surgeon: Carol Ada, MD;  Location: [facility];  Service: Endoscopy;  Laterality: Left;  Abnormal barium swallow   FOOT TENDON SURGERY     GALLBLADDER SURGERY     INCISIONAL HERNIA REPAIR N/A 03/01/2019   Procedure: OPEN INCISIONAL HERNIA REPAIR;  Surgeon: Armandina Gemma, MD;  Location: [facility];  Service: General;  Laterality: N/A;   INSERTION OF MESH N/A 03/01/2019    Procedure: INSERTION OF MESH;  Surgeon: Armandina Gemma, MD;  Location: [facility];  Service: General;  Laterality: N/A;   INTRAOPERATIVE TRANSTHORACIC ECHOCARDIOGRAM N/A 08/02/2017   Procedure: INTRAOPERATIVE TRANSTHORACIC ECHOCARDIOGRAM;  Surgeon: Burnell Blanks, MD;  Location: [place];  Service: Open Heart Surgery;  Laterality: N/A;   IR CT HEAD LTD  04/16/2022   IR PERCUTANEOUS ART THROMBECTOMY/INFUSION INTRACRANIAL INC DIAG ANGIO  04/16/2022   RADIOLOGY WITH ANESTHESIA N/A 04/16/2022   Procedure: IR  WITH ANESTHESIA;  Surgeon: Luanne Bras, MD;  Location: [place];  Service: Radiology;  Laterality: N/A;   REPLACEMENT TOTAL KNEE BILATERAL     RIGHT/LEFT HEART CATH AND CORONARY ANGIOGRAPHY N/A 07/05/2017   Procedure: RIGHT/LEFT HEART CATH AND CORONARY ANGIOGRAPHY;  Surgeon: Belva Crome, MD;  Location: [place] [facility];  Service: Cardiovascular;  Laterality: N/A;   SKIN CANCER EXCISION  2018   right nostril    TOTAL ABDOMINAL HYSTERECTOMY     TRANSCATHETER AORTIC VALVE REPLACEMENT, TRANSFEMORAL N/A 08/02/2017   Procedure: TRANSCATHETER AORTIC VALVE REPLACEMENT, TRANSFEMORAL;  Surgeon: Burnell Blanks, MD;  Location: [place];  Service: Open Heart Surgery;  Laterality: N/A;    Current Medications: No outpatient medications have been marked as taking for the 05/19/22 encounter (Appointment) with Richardo Priest, MD.     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 [employer]  Tobacco Use   Smoking status: Former    Packs/day: 0.50    Years: 20.00    Total pack years: 10.00    Types: Cigarettes    Quit date: 03/15/1964    Years since quitting: 58.2   Smokeless tobacco: Never  Vaping Use    Vaping Use: Never used  Substance and Sexual Activity   Alcohol use: Yes    Comment: social   Drug use: No   Sexual activity: Not on file  Other Topics Concern   Not on file  Social History Narrative   Not on file   Social Determinants of Health   Financial Resource Strain: Not on file  Food Insecurity: Not on file  Transportation Needs: Not on file  Physical Activity: Not on file  Stress: Not on file  Social Connections: Not on file     Family History: The patient's ***family history includes Breast cancer in her sister; Heart disease in her father and maternal grandfather; Stroke in her mother.  ROS:   ROS Please see the history of present illness.    *** All other systems reviewed and are negative.  EKGs/Labs/Other Studies Reviewed:    The following studies were reviewed today: ***  Cardiac Studies & Procedures   CARDIAC CATHETERIZATION  CARDIAC CATHETERIZATION 07/05/2017  Narrative  Calcified thoracic, arch, and descending aorta  RCA has stent extending into the ascending aortic root.  There is ostial 70% narrowing.  The RCA is dominant without any significant residual disease.  Left main is widely patent.  LAD beyond the first diagonal contains eccentric 50-70% narrowing.  The first diagonal contains a branch that has 90% stenosis.  Circumflex ostium contains 40-50% narrowing.  3 relatively small obtuse marginal branches are widely patent.  Mild pulmonary hypertension.  Moderate aortic stenosis based upon hemodynamics with calculated aortic valve area 1.3 cm square.  Peak to peak gradient 30 mmHg.  Possible stage D3 severe aortic stenosis.  RECOMMENDATIONS:   Follow-up with Dr. Angelena Form to determine further steps towards consideration of percutaneous aortic valve replacement.  Findings Coronary Findings Diagnostic  Dominance: Right  Left Main Dist LM to Ost LAD lesion is 40% stenosed.  Left Anterior Descending Prox LAD lesion is 70%  stenosed.  First Diagonal Branch 1st Diag lesion is 85%  stenosed.  Left Circumflex Ost Cx lesion is 50% stenosed.  Right Coronary Artery Ost RCA to Prox RCA lesion is 70% stenosed. The lesion was previously treated. Mid RCA lesion is 30% stenosed.  Intervention  No interventions have been documented.     ECHOCARDIOGRAM  ECHOCARDIOGRAM COMPLETE 04/17/2022  Narrative ECHOCARDIOGRAM REPORT    Patient Name:   [name] Date of Exam: 04/17/2022 Medical Rec #:  [id number]                 Height:       59.0 in Accession #:    [id number]                Weight:       142.4 lb Date of Birth:  12/04/1925                  BSA:          1.597 m Patient Age:    30 years                  BP:           121/95 mmHg Patient Gender: F                         HR:           110 bpm. Exam Location:  Inpatient  Procedure: 2D Echo, Cardiac Doppler and Color Doppler  Indications:    I63.9 Stroke  History:        Patient has prior history of Echocardiogram examinations, most recent 03/02/2022. CKD and Stroke, Arrythmias:Atrial Fibrillation; Risk Factors:Hypertension and Dyslipidemia.  Sonographer:    Wilkie Aye RVT RCS Referring Phys: [id number] [place]   Sonographer Comments: Echo performed with patient supine and on artificial respirator. IMPRESSIONS   1. Consider infiltrative cardiomyopathy such as amyloid. Left ventricular ejection fraction, by estimation, is 45 to 50%. Left ventricular ejection fraction by PLAX is 47 %. The left ventricle has mildly decreased function. The left ventricle demonstrates global hypokinesis. There is severe concentric left ventricular hypertrophy. Left ventricular diastolic function could not be evaluated. 2. Right ventricular systolic function is mildly reduced. The right ventricular size is normal. There is normal pulmonary artery systolic pressure. The estimated right ventricular systolic pressure is 123456 mmHg. 3. Left atrial size  was moderately dilated. 4. Right atrial size was moderately dilated. 5. A small pericardial effusion is present. The pericardial effusion is circumferential. There is no evidence of cardiac tamponade. 6. The mitral valve is abnormal. Trivial mitral valve regurgitation. Moderate mitral annular calcification. 7. The aortic valve is tricuspid. Aortic valve regurgitation is not visualized. No aortic stenosis is present. 8. The inferior vena cava is normal in size with greater than 50% respiratory variability, suggesting right atrial pressure of 3 mmHg. 9. Rhythm strip during this exam demonstrates atrial fibrillation.  Comparison(s): Prior images unable to be directly viewed, comparison made by report only. Changes from prior study are noted. 03/02/2022: [facility]) - LVEF 55-60%.  FINDINGS Left Ventricle: Consider infiltrative cardiomyopathy such as amyloid. Left ventricular ejection fraction, by estimation, is 45 to 50%. Left ventricular ejection fraction by PLAX is 47 %. The left ventricle has mildly decreased function. The left ventricle demonstrates global hypokinesis. The left ventricular internal cavity size was normal in size. There is severe concentric left ventricular hypertrophy. Left ventricular diastolic function could not be evaluated due to atrial fibrillation. Left  ventricular diastolic function could not be evaluated.  Right Ventricle: The right ventricular size is normal. No increase in right ventricular wall thickness. Right ventricular systolic function is mildly reduced. There is normal pulmonary artery systolic pressure. The tricuspid regurgitant velocity is 2.02 m/s, and with an assumed right atrial pressure of 3 mmHg, the estimated right ventricular systolic pressure is 123456 mmHg.  Left Atrium: Left atrial size was moderately dilated.  Right Atrium: Right atrial size was moderately dilated.  Pericardium: A small pericardial effusion is present. The pericardial  effusion is circumferential. There is no evidence of cardiac tamponade.  Mitral Valve: The mitral valve is abnormal. There is moderate calcification of the anterior and posterior mitral valve leaflet(s). Moderate mitral annular calcification. Trivial mitral valve regurgitation.  Tricuspid Valve: The tricuspid valve is grossly normal. Tricuspid valve regurgitation is mild.  Aortic Valve: The aortic valve is tricuspid. Aortic valve regurgitation is not visualized. No aortic stenosis is present. Aortic valve mean gradient measures 3.3 mmHg. Aortic valve peak gradient measures 6.1 mmHg. Aortic valve area, by VTI measures 1.29 cm.  Pulmonic Valve: The pulmonic valve was grossly normal. Pulmonic valve regurgitation is trivial.  Aorta: The aortic root and ascending aorta are structurally normal, with no evidence of dilitation.  Venous: The inferior vena cava is normal in size with greater than 50% respiratory variability, suggesting right atrial pressure of 3 mmHg.  IAS/Shunts: No atrial level shunt detected by color flow Doppler.  EKG: Rhythm strip during this exam demonstrates atrial fibrillation.   LEFT VENTRICLE PLAX 2D LV EF:         Left ventricular ejection fraction by PLAX is 47 %. LVIDd:         2.70 cm LVIDs:         2.10 cm LV PW:         1.80 cm LV IVS:        1.90 cm LVOT diam:     1.40 cm LV SV:         27 LV SV Index:   17 LVOT Area:     1.54 cm   RIGHT VENTRICLE            IVC RV Basal diam:  3.70 cm    IVC diam: 0.90 cm RV S prime:     7.81 cm/s TAPSE (M-mode): 1.9 cm  LEFT ATRIUM           Index        RIGHT ATRIUM           Index LA diam:      4.00 cm 2.51 cm/m   RA Area:     12.40 cm LA Vol (A2C): 68.6 ml 42.97 ml/m  RA Volume:   28.60 ml  17.91 ml/m AORTIC VALVE AV Area (Vmax):    1.40 cm AV Area (Vmean):   1.34 cm AV Area (VTI):     1.29 cm AV Vmax:           123.00 cm/s AV Vmean:          84.767 cm/s AV VTI:            0.206 m AV Peak Grad:       6.1 mmHg AV Mean Grad:      3.3 mmHg LVOT Vmax:         111.57 cm/s LVOT Vmean:        73.633 cm/s LVOT VTI:          0.172  m LVOT/AV VTI ratio: 0.84  AORTA Ao Root diam: 2.30 cm  MITRAL VALVE                TRICUSPID VALVE MV Area (PHT): 3.10 cm     TR Peak grad:   16.3 mmHg MV Decel Time: 244 msec     TR Vmax:        202.00 cm/s MV E velocity: 152.00 cm/s MV A velocity: 54.55 cm/s   SHUNTS MV E/A ratio:  2.79         Systemic VTI:  0.17 m Systemic Diam: 1.40 cm  Lyman Bishop MD Electronically signed by Lyman Bishop MD Signature Date/Time: 04/17/2022/12:23:41 PM    Final     CT SCANS  CT CORONARY MORPH W/CTA COR W/SCORE 07/25/2017  Addendum 07/25/2017  5:36 PM ADDENDUM REPORT: 07/25/2017 17:34  CLINICAL DATA:  89 year old female with severe aortic stenosis being evaluated for a TAVR procedure.  EXAM: Cardiac TAVR CT  TECHNIQUE: The patient was scanned on a [employer]. A 120 kV retrospective scan was triggered in the descending thoracic aorta at 111 HU's. Gantry rotation speed was 250 msecs and collimation was .6 mm. 5 mg of iv Metoprolol and no nitro were given. The 3D data set was reconstructed in 5% intervals of the R-R cycle. Systolic and diastolic phases were analyzed on a dedicated work station using MPR, MIP and VRT modes. The patient received 80 cc of contrast.  FINDINGS: Aortic Valve: Trileaflet aortic valve with severely thickened and moderately calcified leaflets with severely reduced leaflets excursion. Only minimal calcifications are extending into the LVOT.  Aorta: Normal size with mild calcifications in the ascending aorta but moderate to severe atherosclerosis and almost circumferential calcifications in the aortic arch and descending thoracic aorta. No dissection.  Sinotubular Junction: 26 x 25 mm  Ascending Thoracic Aorta: 30 x 29 mm  Aortic Arch: 20 x 20 mm  Descending Thoracic Aorta: 23 x 21 mm  Sinus of Valsalva  Measurements:  Non-coronary: 30 mm  Right -coronary: 31 mm  Left -coronary: 32 mm  Coronary Artery Height above Annulus:  Left Main: 17 mm  Right Coronary: 18 mm  Virtual Basal Annulus Measurements:  Maximum/Minimum Diameter: 27.2 x 22.1 mm  Mean Diameter: 24.8 mm  Perimeter: 79.1 mm  Area: 484 mm2  Coronary Arteries: NTG not used and scan not sufficient for coronary evaluation, however right ostial coronary stent is extending into the right coronary sinus (3.4 mm). However, RCA is 18 mm above the annulus.  Optimum Fluoroscopic Angle for Delivery: LAO 8 CAU 7  Dilated pulmonary artery measuring 31 x 24 mm  IMPRESSION: 1. Trileaflet aortic valve with severely thickened and moderately calcified leaflets with severely reduced leaflets excursion. Only minimal calcifications are extending into the LVOT. Annular measurements suitable for delivery of a 26 mm Edwards-SAPIEN 3 valve.  2. Sufficient coronary to annulus distance.  3. Optimum Fluoroscopic Angle for Delivery: LAO 8 CAU 7.  4. No thrombus in the left atrial appendage.  5. Normal size of the thoracic aorta with mild calcifications in the ascending aorta but moderate to severe atherosclerosis and almost circumferential calcifications in the aortic arch and descending thoracic aorta. No dissection.  6. Right ostial coronary stent is extending into the right coronary sinus (3.4 mm). However, RCA is 18 mm above the annulus and should not interfere with valve placement.   Electronically Signed By: Ena Dawley On: 07/25/2017 17:34  Narrative EXAM: OVER-READ INTERPRETATION  CT CHEST  The following report is an over-read performed by radiologist Dr. Vinnie Langton of [facility], [place] on 07/25/2017. This over-read does not include interpretation of cardiac or coronary anatomy or pathology. The coronary calcium score/coronary CTA interpretation by the cardiologist is attached.  COMPARISON:   Chest CT 03/13/2013.  FINDINGS: Extracardiac findings will be described separately under dictation for contemporaneously obtained CTA chest, abdomen and pelvis.  IMPRESSION: Please see separate dictation for contemporaneously obtained CTA chest, abdomen and pelvis 07/25/2017 for full description of relevant extracardiac findings.  Electronically Signed: By: Vinnie Langton M.D. On: 07/25/2017 12:27          EKG:  EKG is *** ordered today.  The ekg ordered today is personally reviewed and demonstrates ***  Recent Labs: 04/28/2022: ALT 15; BUN 14; Creatinine, Ser 1.17; Hemoglobin 9.7; Magnesium 2.0; Platelets 324; Potassium 4.1; Sodium 134  Recent Lipid Panel    Component Value Date/Time   CHOL 210 (H) 04/17/2022 0554   TRIG 160 (H) 04/18/2022 0441   HDL 55 04/17/2022 0554   CHOLHDL 3.8 04/17/2022 0554   VLDL 20 04/17/2022 0554   LDLCALC 135 (H) 04/17/2022 0554    Physical Exam:    VS:  There were no vitals taken for this visit.    Wt Readings from Last 3 Encounters:  05/06/22 137 lb 9.6 oz (62.4 kg)  04/28/22 132 lb 15 oz (60.3 kg)  04/16/22 142 lb 6.7 oz (64.6 kg)     GEN: *** Well nourished, well developed in no acute distress HEENT: Normal NECK: No JVD; No carotid bruits LYMPHATICS: No lymphadenopathy CARDIAC: ***RRR, no murmurs, rubs, gallops RESPIRATORY:  Clear to auscultation without rales, wheezing or rhonchi  ABDOMEN: Soft, non-tender, non-distended MUSCULOSKELETAL:  No edema; No deformity  SKIN: Warm and dry NEUROLOGIC:  Alert and oriented x 3 PSYCHIATRIC:  Normal affect     Signed, Shirlee More, MD  05/19/2022 12:36 PM    [facility]

## 2022-05-19 ENCOUNTER — Ambulatory Visit: Payer: Medicare Other | Admitting: Cardiology

## 2022-05-19 ENCOUNTER — Other Ambulatory Visit: Payer: Self-pay

## 2022-05-19 ENCOUNTER — Telehealth: Payer: Self-pay | Admitting: Interventional Cardiology

## 2022-05-19 ENCOUNTER — Ambulatory Visit: Payer: Medicare Other

## 2022-05-19 DIAGNOSIS — R3915 Urgency of urination: Secondary | ICD-10-CM

## 2022-05-19 DIAGNOSIS — R8290 Unspecified abnormal findings in urine: Secondary | ICD-10-CM

## 2022-05-19 DIAGNOSIS — Z8673 Personal history of transient ischemic attack (TIA), and cerebral infarction without residual deficits: Secondary | ICD-10-CM

## 2022-05-19 DIAGNOSIS — Z9181 History of falling: Secondary | ICD-10-CM

## 2022-05-19 DIAGNOSIS — M79672 Pain in left foot: Secondary | ICD-10-CM | POA: Diagnosis not present

## 2022-05-19 DIAGNOSIS — R4182 Altered mental status, unspecified: Secondary | ICD-10-CM

## 2022-05-19 DIAGNOSIS — R350 Frequency of micturition: Secondary | ICD-10-CM | POA: Diagnosis not present

## 2022-05-19 DIAGNOSIS — N390 Urinary tract infection, site not specified: Secondary | ICD-10-CM | POA: Diagnosis not present

## 2022-05-19 DIAGNOSIS — I4891 Unspecified atrial fibrillation: Secondary | ICD-10-CM | POA: Diagnosis not present

## 2022-05-19 LAB — POCT URINALYSIS DIPSTICK
Bilirubin, UA: NEGATIVE
Blood, UA: NEGATIVE
Glucose, UA: POSITIVE — AB
Ketones, UA: NEGATIVE
Nitrite, UA: POSITIVE
Protein, UA: NEGATIVE
Spec Grav, UA: 1.02 (ref 1.010–1.025)
Urobilinogen, UA: 0.2 E.U./dL
pH, UA: 7 (ref 5.0–8.0)

## 2022-05-19 MED ORDER — CIPROFLOXACIN HCL 500 MG PO TABS: 500.0000 mg | ORAL_TABLET | Freq: Two times a day (BID) | ORAL | 0 refills | Status: AC

## 2022-05-19 MED ORDER — DICLOFENAC SODIUM 75 MG PO TBEC: 75.0000 mg | DELAYED_RELEASE_TABLET | Freq: Two times a day (BID) | ORAL | 11 refills | Status: DC

## 2022-05-19 NOTE — Telephone Encounter (Signed)
Pt's daughter, [name] called in to cancel pt's appt for 2:40pm due to pt not feeling well. Pt has a bladder infection. [name] also stated that they've incorporated hospice into the pts care. She states the pt has changed a lot within the past 6 months. [name] did not want to r/s the pts appt as of right now, states everything is good.

## 2022-05-21 LAB — URINE CULTURE

## 2022-05-25 ENCOUNTER — Other Ambulatory Visit: Payer: Self-pay | Admitting: Internal Medicine

## 2022-05-25 ENCOUNTER — Other Ambulatory Visit: Payer: Self-pay

## 2022-05-25 MED ORDER — LORAZEPAM 0.5 MG PO TABS: 0.5000 mg | ORAL_TABLET | Freq: Three times a day (TID) | ORAL | 1 refills | Status: DC | PRN

## 2022-05-25 MED ORDER — TRAZODONE HCL 50 MG PO TABS: 50.0000 mg | ORAL_TABLET | Freq: Every day | ORAL | 11 refills | Status: DC

## 2022-05-28 ENCOUNTER — Other Ambulatory Visit: Payer: Self-pay

## 2022-05-28 DIAGNOSIS — I480 Paroxysmal atrial fibrillation: Secondary | ICD-10-CM | POA: Diagnosis not present

## 2022-05-28 DIAGNOSIS — I63419 Cerebral infarction due to embolism of unspecified middle cerebral artery: Secondary | ICD-10-CM

## 2022-05-28 MED ORDER — BUSPIRONE HCL 5 MG PO TABS: 5.0000 mg | ORAL_TABLET | Freq: Three times a day (TID) | ORAL | 0 refills | Status: DC | PRN

## 2022-06-08 ENCOUNTER — Other Ambulatory Visit: Payer: Self-pay | Admitting: Internal Medicine

## 2022-06-10 ENCOUNTER — Other Ambulatory Visit: Payer: Self-pay

## 2022-06-10 MED ORDER — BUSPIRONE HCL 5 MG PO TABS: 5.0000 mg | ORAL_TABLET | Freq: Three times a day (TID) | ORAL | 1 refills | Status: DC | PRN

## 2022-07-08 ENCOUNTER — Other Ambulatory Visit: Payer: Self-pay

## 2022-07-08 MED ORDER — TRAZODONE HCL 50 MG PO TABS: 50.0000 mg | ORAL_TABLET | Freq: Every day | ORAL | 11 refills | Status: DC

## 2022-07-08 MED ORDER — QUETIAPINE FUMARATE 25 MG PO TABS: 25.0000 mg | ORAL_TABLET | Freq: Every day | ORAL | 11 refills | Status: DC

## 2022-07-29 ENCOUNTER — Other Ambulatory Visit: Payer: Self-pay

## 2022-07-29 NOTE — Patient Outreach (Signed)
First telephone outreach attempt to obtain mRS. No answer. Left message for returned call.  Ashari Llewellyn [facility]Care Management Assistant [phone]  

## 2022-08-02 ENCOUNTER — Other Ambulatory Visit: Payer: Self-pay

## 2022-08-02 NOTE — Patient Outreach (Signed)
Telephone outreach to patient to obtain mRS was successfully completed. MRS= 3 patient is being moved to hospice.  Vanice Sarah [facility] Care Management Assistant [phone]

## 2022-08-14 ENCOUNTER — Other Ambulatory Visit: Payer: Self-pay | Admitting: Internal Medicine

## 2022-08-30 NOTE — Telephone Encounter (Signed)
Done

## 2022-09-13 DEATH — deceased

## 2023-04-22 IMAGING — RF DG ESOPHAGUS
13 of 14 series · 17 of 24 positions shown · non-contrast
Comparison: CT of the chest 07/25/2017.

CLINICAL DATA: Dysphagia 8AO.A3 (PDO-T8-CM). Additional history
provided: Patient reports 6 months of progressive dysphagia, feeling
as though foods and liquids are becoming stuck in the region of the
throat, weight loss.

EXAM:
ESOPHOGRAM/BARIUM SWALLOW
TECHNIQUE: A single contrast examination was performed using barium contrast.
FLUOROSCOPY TIME:  Fluoroscopy Time:  2 minutes, 30 seconds.
Radiation Exposure Index (if provided by the fluoroscopic device):
33.3 mGy
Number of Acquired Spot Images: 4

[Series 1: cp_standard · 0.35mm/px · 1 of 109 frames shown (1 of 10)]
[frame 3/109]
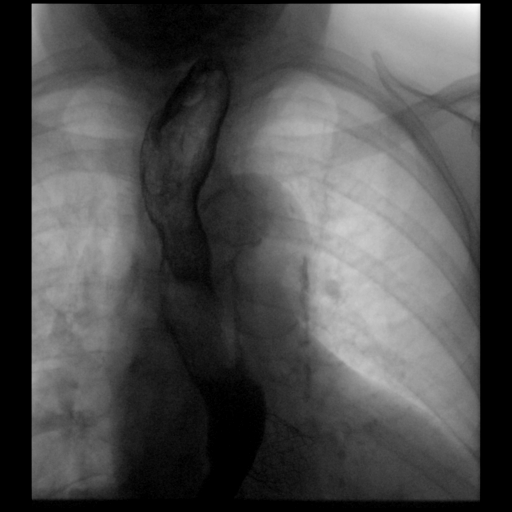

[Series 2: cp_standard · 0.35mm/px · 2 of 48 frames shown (2 of 10)]
[frame 1/48]
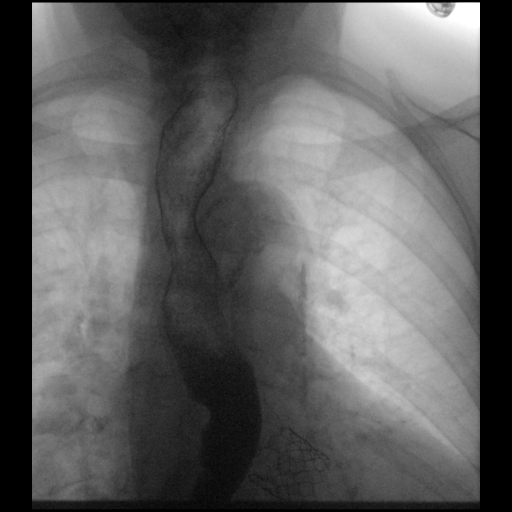
[frame 25/48]
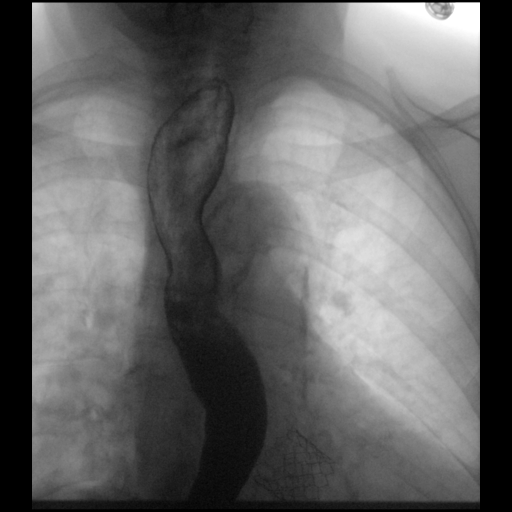

[Series 3: cp_standard · 0.35mm/px · 1 of 83 frames shown (3 of 10)]
[frame 5/83]
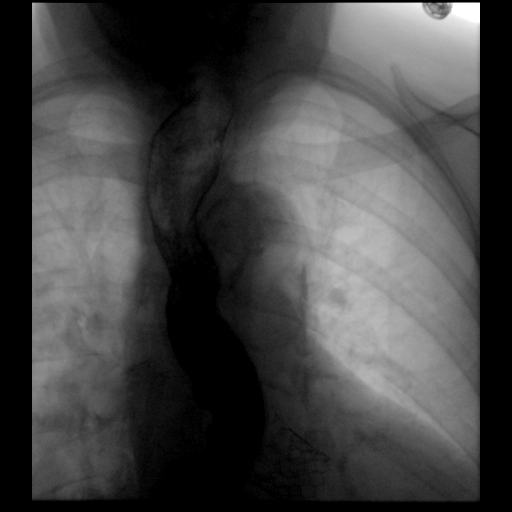

[Series 4: fluoro_barium 2fps_bw · 0.17mm/px · 1 of 1 slices shown (1 of 3)]
[im 1/1]
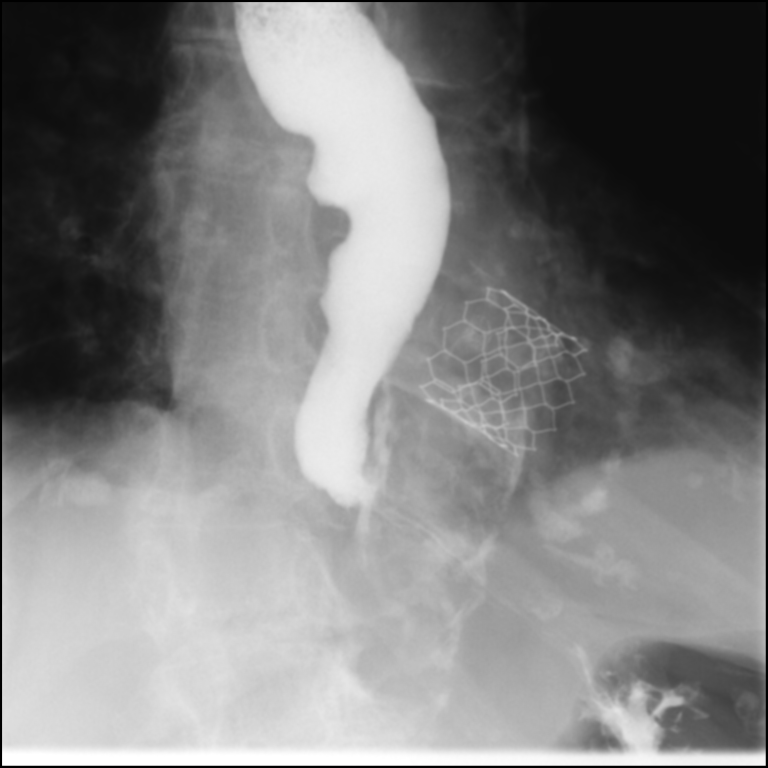

[Series 5: cp_standard · 0.35mm/px · 1 of 113 frames shown (4 of 10)]
[frame 17/113]
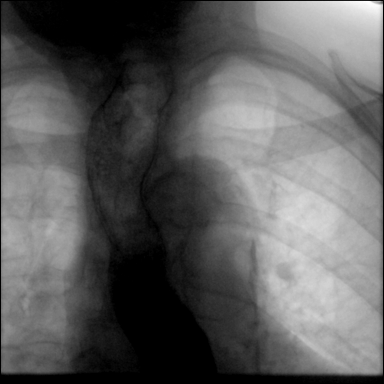

[Series 7: fluoro_barium 2fps_bw · 0.18mm/px · 1 of 1 slices shown (2 of 3)]
[im 1/1]
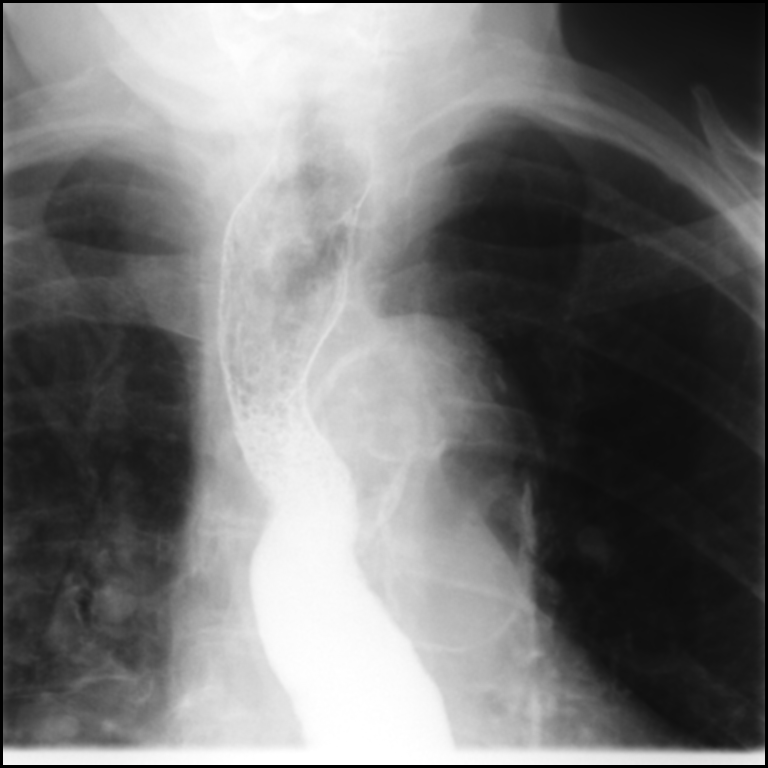

[Series 9: cp_standard · 0.35mm/px · 1 of 27 frames shown (5 of 10)]
[frame 5/27]
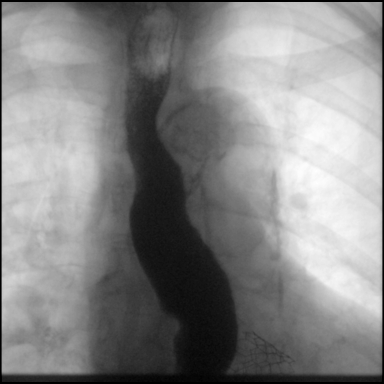

[Series 10: cp_standard · 0.35mm/px · 2 of 71 frames shown (6 of 10)]
[frame 11/71]
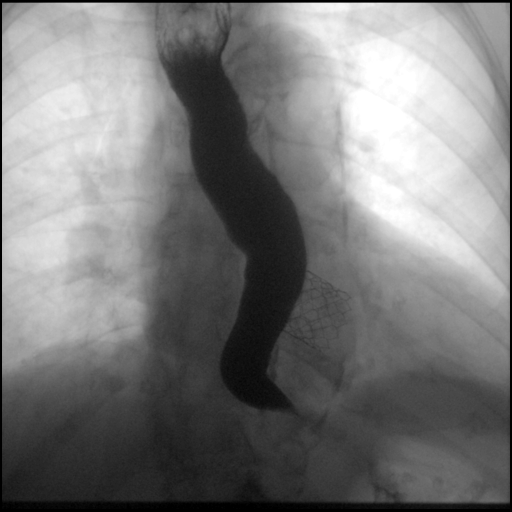
[frame 46/71]
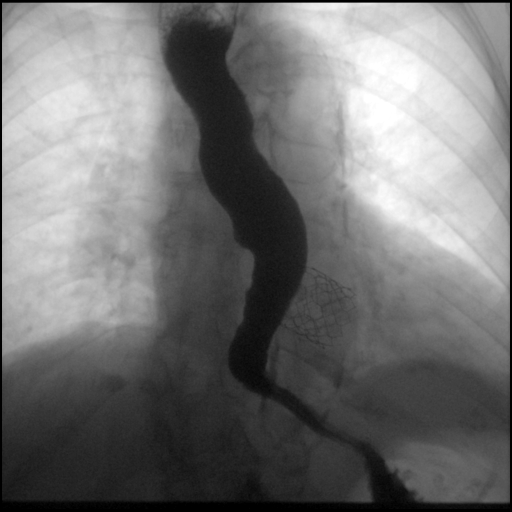

[Series 11: fluoro_barium 2fps_bw · 0.18mm/px · 1 of 1 slices shown (3 of 3)]
[im 1/1]
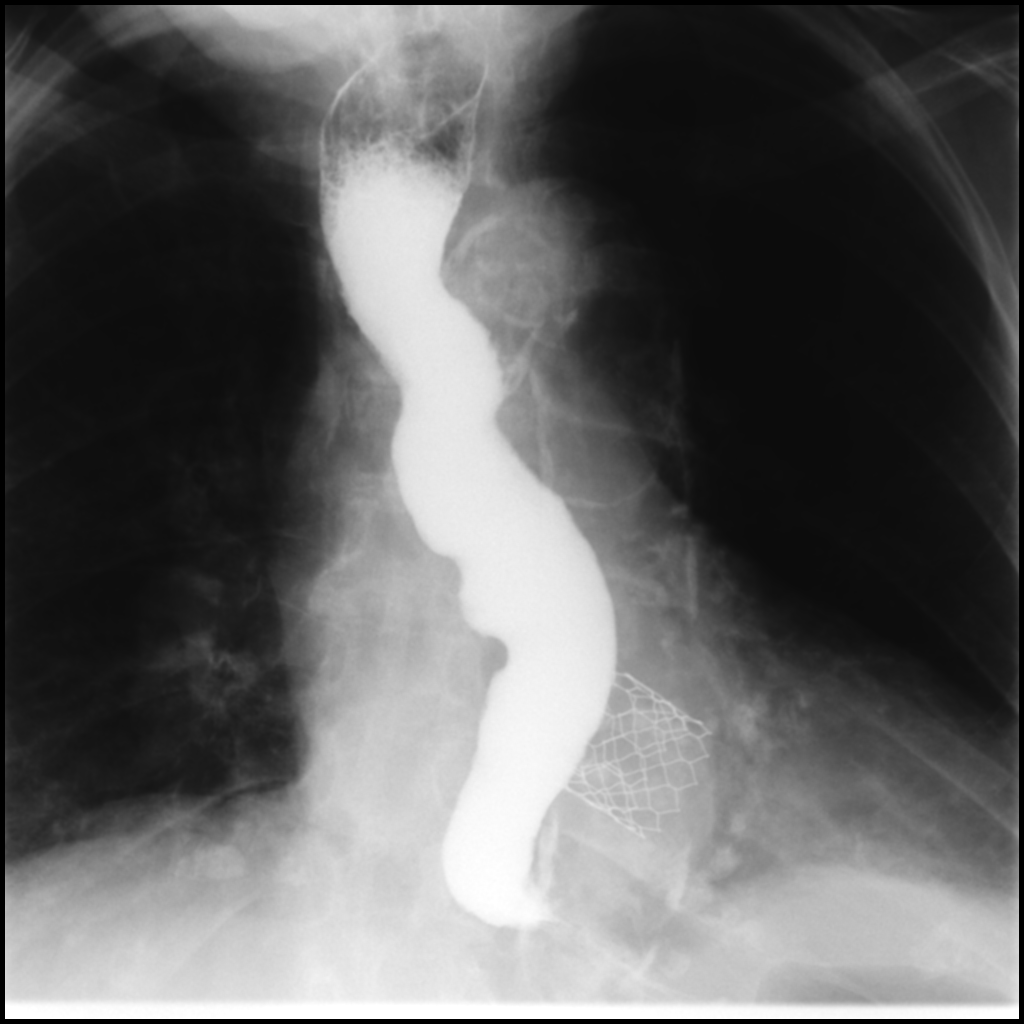

[Series 14: cp_standard · 0.35mm/px · 2 of 119 frames shown (7 of 10)]
[frame 18/119]
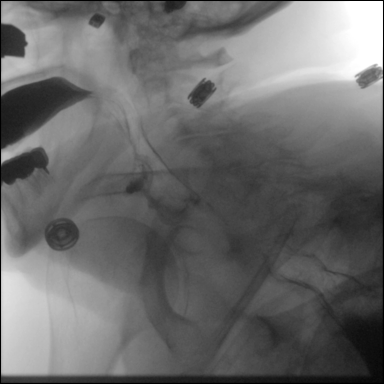
[frame 102/119]
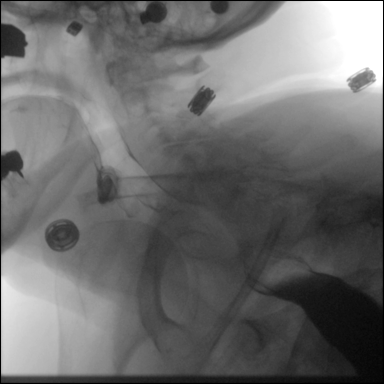

[Series 15: cp_standard · 0.35mm/px · 1 of 61 frames shown (8 of 10)]
[frame 52/61]
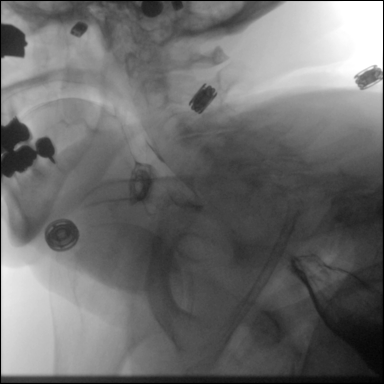

[Series 16: cp_standard · 0.35mm/px · 2 of 33 frames shown (9 of 10)]
[frame 8/33]
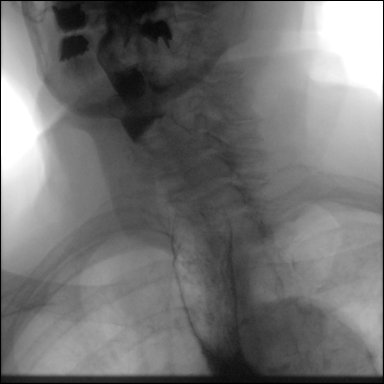
[frame 29/33]
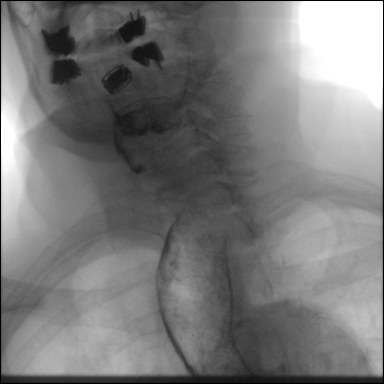

[Series 17: cp_standard · 0.35mm/px · 1 of 53 frames shown (10 of 10)]
[frame 46/53]
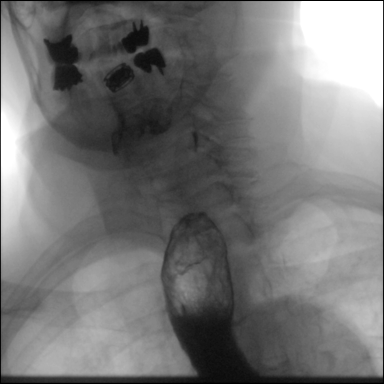

[17 of 24 positions shown; findings below may reference images not displayed]

FINDINGS: The esophagus is mildly patulous and there is a paucity of
esophageal peristalsis. Narrowed appearance of the gastroesophageal
junction. Significantly delayed, and intermittent, passage of
contrast from the esophagus into the stomach. No gastroesophageal
reflux identified. No appreciable hiatal hernia. Due to patient
apprehension, a 13 mm barium tablet was not administered.
IMPRESSION: Mildly patulous esophagus with a paucity of esophageal peristalsis.

There is a narrowed appearance of the gastroesophageal junction and
there is significantly delayed, and intermittent, passage of
contrast from the esophagus into the stomach. Findings are
suggestive of a stricture at the level of the distal esophagus/GE
junction, and endoscopy is recommended for further evaluation.

## 2024-02-17 ENCOUNTER — Encounter (HOSPITAL_COMMUNITY): Payer: Self-pay | Admitting: Surgery
